# Patient Record
Sex: Male | Born: 1935
Health system: Southern US, Community
[De-identification: ages and names within clinical notes are randomized; demographics above are authoritative.]

## PROBLEM LIST (undated history)

## (undated) DIAGNOSIS — I2510 Atherosclerotic heart disease of native coronary artery without angina pectoris: Secondary | ICD-10-CM

## (undated) DIAGNOSIS — J189 Pneumonia, unspecified organism: Secondary | ICD-10-CM

## (undated) DIAGNOSIS — N200 Calculus of kidney: Secondary | ICD-10-CM

## (undated) DIAGNOSIS — F329 Major depressive disorder, single episode, unspecified: Secondary | ICD-10-CM

## (undated) DIAGNOSIS — K219 Gastro-esophageal reflux disease without esophagitis: Secondary | ICD-10-CM

## (undated) DIAGNOSIS — E119 Type 2 diabetes mellitus without complications: Secondary | ICD-10-CM

## (undated) DIAGNOSIS — I10 Essential (primary) hypertension: Secondary | ICD-10-CM

## (undated) DIAGNOSIS — I2119 ST elevation (STEMI) myocardial infarction involving other coronary artery of inferior wall: Secondary | ICD-10-CM

## (undated) DIAGNOSIS — F419 Anxiety disorder, unspecified: Secondary | ICD-10-CM

## (undated) DIAGNOSIS — E785 Hyperlipidemia, unspecified: Secondary | ICD-10-CM

## (undated) DIAGNOSIS — F32A Depression, unspecified: Secondary | ICD-10-CM

## (undated) DIAGNOSIS — M1990 Unspecified osteoarthritis, unspecified site: Secondary | ICD-10-CM

## (undated) DIAGNOSIS — J449 Chronic obstructive pulmonary disease, unspecified: Secondary | ICD-10-CM

## (undated) DIAGNOSIS — G4730 Sleep apnea, unspecified: Secondary | ICD-10-CM

## (undated) HISTORY — DX: Atherosclerotic heart disease of native coronary artery without angina pectoris: I25.10

## (undated) HISTORY — PX: EYE SURGERY: SHX253

## (undated) HISTORY — DX: Type 2 diabetes mellitus without complications: E11.9

## (undated) HISTORY — DX: Essential (primary) hypertension: I10

## (undated) HISTORY — DX: ST elevation (STEMI) myocardial infarction involving other coronary artery of inferior wall: I21.19

---

## 1992-04-13 HISTORY — PX: CORONARY ARTERY BYPASS GRAFT: SHX141

## 1998-08-03 ENCOUNTER — Inpatient Hospital Stay (HOSPITAL_COMMUNITY): Admission: AD | Admit: 1998-08-03 | Discharge: 1998-08-07 | Payer: Self-pay | Admitting: Cardiology

## 2003-08-28 ENCOUNTER — Ambulatory Visit: Admission: RE | Admit: 2003-08-28 | Discharge: 2003-08-28 | Payer: Self-pay | Admitting: Pulmonary Disease

## 2004-04-30 ENCOUNTER — Inpatient Hospital Stay (HOSPITAL_COMMUNITY): Admission: EM | Admit: 2004-04-30 | Discharge: 2004-05-02 | Payer: Self-pay | Admitting: *Deleted

## 2004-04-30 ENCOUNTER — Ambulatory Visit: Payer: Self-pay | Admitting: Cardiology

## 2004-05-12 ENCOUNTER — Ambulatory Visit: Payer: Self-pay | Admitting: Cardiology

## 2004-08-11 ENCOUNTER — Ambulatory Visit: Payer: Self-pay | Admitting: Cardiology

## 2007-06-07 ENCOUNTER — Ambulatory Visit: Payer: Self-pay | Admitting: Cardiology

## 2007-09-26 ENCOUNTER — Ambulatory Visit (HOSPITAL_COMMUNITY): Admission: RE | Admit: 2007-09-26 | Discharge: 2007-09-26 | Payer: Self-pay | Admitting: Ophthalmology

## 2007-10-10 ENCOUNTER — Ambulatory Visit (HOSPITAL_COMMUNITY): Admission: RE | Admit: 2007-10-10 | Discharge: 2007-10-10 | Payer: Self-pay | Admitting: Ophthalmology

## 2008-07-09 ENCOUNTER — Ambulatory Visit (HOSPITAL_COMMUNITY): Admission: RE | Admit: 2008-07-09 | Discharge: 2008-07-09 | Payer: Self-pay | Admitting: Ophthalmology

## 2010-08-29 NOTE — Cardiovascular Report (Signed)
NAME:  [name], [name] NO.:  [id number]   MEDICAL RECORD NO.:  [id number]          PATIENT TYPE:  INP   LOCATION:  4705                         FACILITY:  [facility]   PHYSICIAN:  Eustace Quail, M.D. [facility] DATE OF BIRTH:  09-Feb-1934   DATE OF PROCEDURE:  DATE OF DISCHARGE:                              CARDIAC CATHETERIZATION   CLINICAL HISTORY:  [name] is 77 years old, and has had remote bypass  surgery.  He has had previous stenting of the vein graft to the distal right  coronary artery, and has previously documented occlusion of the vein graft  to the diagonal branch of the LAD.  He was recently admitted with chest pain  with a diagnosis of unstable angina and scheduled for evaluation with  angiography.   PROCEDURE:  The procedure was performed by the right femoral artery using an  arterial sheath and 6-French preformed coronary catheters.  A front wall  arterial puncture was performed, and Omnipaque contrast was used.  A LIMA  catheter was used for injection of the LIMA graft. After completion of the  diagnostic study, we made the decision to proceed with intervention on the  native circumflex artery.   The patient was given Angiomax bolus in infusion, was given 300 mg of Plavix  at the beginning of the procedure, and 300 mg of Plavix at the end of the  procedure.  We used a CLS 4, 6-French guiding catheter with side holes and a  Luge wire.  We crossed the lesion in the proximal circumflex artery with the  wire without difficulty.  We pre dilated with a 2.5 x15 mm Maverick  performing one inflation up to 8 atmospheres for 30 seconds.  We then  deployed a 2.5 x60 mm Taxus stent, deploying this with one inflation of 14  atmospheres for 30 seconds.  We then post-dilated with a 2.75 x 12 mm  Quantum Maverick performing one inflation at 15 atmospheres for about 30  seconds.  Repeat diagnostic study was then performed through a guiding  catheter.  There was a 50-60%  stenosis down stream from where we positioned  the stent that we elected not to treat.  The patient tolerate the procedure  well, and left the laboratory in satisfactory condition.   RESULTS:  1.  MAIN CORONARY ARTERY:  The main coronary artery is free of significant      disease.  2.  LEFT ANTERIOR DESCENDING ARTERY:  The left anterior descending artery      was completely occluded near its origin.  3.  CIRCUMFLEX ARTERY:  The circumflex artery gave rise to a ramus artery      and an AV branch which terminated in a posterolateral branch.  The Ramus      artery had moderate, diffuse disease with 50% proximal and 70% mid      stenosis.  There was a 90% stenosis in the proximal portion of the      circumflex artery, and a moderate bed.  There was another 50-60%      stenosis in the mid portion of the circumflex  artery.  4.  RIGHT CORONARY ARTERY:  The right coronary artery is completely occluded      proximally.  5.  SAPHENOUS VEIN GRAFT:  The saphenous vein graft to the posterior      descending and posterolateral branch of the right coronary artery was      patent. There was 40% narrowing within the stent, in the proximal      portion of the graft. The graft also fed a second posterolateral branch,      and this had a 90% stenosis in its proximal portion.   The saphenous vein graft to the diagonal branch of the LAD was completely  occluded (this was old).  The LIMA graft to the LAD was patent and  functioned normally.   LEFT VENTRICULOGRAM:  The left ventriculogram was performed in the RAO  projection showed good wall motion with no areas of hypokinesis.  The  ejection fraction was 60%.   Following stenting of the lesion in the proximal circumflex artery, the  stenosis improved from 90% to 0%.   The aortic pressure was 134/74, and left ventricular pressure was 134/22.   CONCLUSION:  1.  Coronary artery disease status post coronary bypass surgery in 1994.      Severe native  vessel disease with total occlusion of the proximal left      anterior descending artery, 70% stenosis in the ramus branch of the      circumflex artery, and 90% stenosis in the proximal circumflex artery,      and total occlusion of the right coronary artery.  2.  A 40% stenosis within the stent and the saphenous vein graft to the      posterior descending, and posterolateral branch of the circumflex artery      with 90% stenosis in the second posterolateral branch distal to the      graft insertion, occluded vein graft to the diagonal branch of the LAD      (old), and patent LIMA graft to the LAD.  3.  Normal LV function.  4.  Successful stenting of the lesion in the proximal native circumflex      artery using a Taxus drug-eluting stent with improvement in the percent      of narrowing from 90% to 0%.   DISPOSITION:  The patient returned to postanesthesia care unit for further  observation.  The patient will have potential residual ischemia in the  posterolateral branch of the right coronary artery which is not feasible for  PCI.  We will plan medical therapy of that.       BB/MEDQ  D:  05/01/2004  T:  05/02/2004  Job:  [id number]   cc:   [name], Dr.  Skip [name], M.D.   Cardiopulmonary Lab

## 2010-08-29 NOTE — H&P (Signed)
NAMEARSHAWN, NORRED              ACCOUNT NO.:  [id number]   MEDICAL RECORD NO.:  [id number]          PATIENT TYPE:  INP   LOCATION:  4705                         FACILITY:  [facility]   PHYSICIAN:  Marijo Conception. Wall, M.D.   DATE OF BIRTH:  April 23, 1933   DATE OF ADMISSION:  04/30/2004  DATE OF DISCHARGE:                                HISTORY & PHYSICAL   CHIEF COMPLAINT:  Chest discomfort, much like my heart problems.   HISTORY OF PRESENT ILLNESS:  [name] is a pleasant 77 year old gentleman  who transferred from [facility] this evening for cardiac  catheterization.  He has known coronary artery disease and had coronary  artery bypass grafting in 1994 by Dr. Denton Meek. [place].  I am waiting on  the operative report at present.  He apparently had a stent about four or  five years ago.  He has done well.   Over the weekend, he had some chest discomfort for about 30 minutes but  relieved by nitroglycerin.  Again, today, he had the same event.  He came to  the emergency room to get checked out.   His EKG there showed nonspecific changes.  Cardiac enzymes were negative x  1.  Other laboratory data was unremarkable.   ALLERGIES:  No known drug allergies.   MEDICATIONS:  1.  Lantus insulin 30 units q.a.m.  2.  Lopressor 25 mg b.i.d.  3.  HCTZ unknown dose q.d.  4.  Tri-Chlor q.d.   PAST MEDICAL HISTORY:  1.  Hypertension.  2.  Diabetes.  3.  Hyperlipidemia.  4.  Degenerative joint disease in his back and in his legs.  5.  He has a difficult time sleeping and requires Trazodone and Xanax.   PAST SURGICAL HISTORY:  He has had cataract surgery.   SOCIAL HISTORY:  Lives in [place], [place] and has four children.  He is  married.  He does not smoke or drink.   FAMILY HISTORY:  His father died in a motor vehicle accident at age 29.  Mother died of a MI at age 29.   REVIEW OF SYMPTOMS:  Really unremarkable.   PHYSICAL EXAMINATION:  VITAL SIGNS:  Blood pressure  is 116/67, pulse 65 and  regular, respiratory rate 18, temperature 98.8.  GENERAL:  In no acute distress.  HEENT:  Normocephalic and atraumatic.  Extraocular movements intact.  PERRLA.  Sclerae are clear.  NECK:  Carotid upstrokes are equal bilaterally without bruits.  No JVD.  Thyroid is not enlarged.  Trachea is midline.  HEART:  Stable.  He has normal S1 and S2 without murmur, rub, or gallop.  LUNGS:  Clear.  ABDOMEN:  Protuberant with good bowel sounds.  No obvious organomegaly.  There is no midline bruit.  EXTREMITIES:  Trace edema.  Pulses were present 2+/4+ and bilaterally  symmetrical in posterior tibial and dorsalis pedis.  NEUROLOGICAL:  Intact.   ASSESSMENT:  1.  Unstable angina.  2.  Known coronary artery disease, status post coronary artery bypass graft      in 1994 and stenting about four or five years  ago.  3.  Type 2 diabetes.  4.  Obesity.  5.  Mixed hyperlipidemia.  6.  Hypertension.   PLAN:  Cardiac catheterization, left cor graft:  Indication, risks, and  potential benefits have been discussed.  We need to pull his catheter report  and also his operative report and last stent.  Percutaneous coronary  intervention prior to his procedure.  IV nitroglycerin and heparin.      TCW/MEDQ  D:  04/30/2004  T:  04/30/2004  Job:  [id number]   cc:   Marvell Fuller, M.D.  [place], [place]

## 2010-08-29 NOTE — Discharge Summary (Signed)
[name], [name]              ACCOUNT NO.:  [id number]   MEDICAL RECORD NO.:  [id number]          PATIENT TYPE:  INP   LOCATION:  6529                         FACILITY:  [facility]   PHYSICIAN:  Jenkins Rouge, M.D.     DATE OF BIRTH:  1933-07-23   DATE OF ADMISSION:  04/30/2004  DATE OF DISCHARGE:  05/02/2004                                 DISCHARGE SUMMARY   PRIMARY CARE PHYSICIAN:  Dr. Liane Comber in [place], [place].   CARDIOLOGIST:  The patient is new to the [facility] office, has been followed at  the [place] office with Dr. Lattie Haw but he will be following up in the  future with Dr. [name] at [facility] for convenience.   DISCHARGE DIAGNOSES:  Chest pain status post cardiac catheterization on  May 01, 2004 with a stent to the proximal circumflex, 90% stenosis  decreased to 0% using a Taxus drug-eluting stent.  Ejection fraction of 60%.   PAST MEDICAL HISTORY:  Coronary artery disease status post coronary artery  bypass grafting in 1994 by Dr. Redmond Pulling.  Apparently the patient had a stent  about four to five years ago also.  Other past medical history includes  hypertension, diabetes, hyperlipidemia, degenerative joint disease in his  back and legs, insomnia requiring trazodone and Xanax.   PAST SURGICAL HISTORY:  Also includes cataract surgery.   HISTORY OF PRESENT ILLNESS:  The patient was initially seen at [facility] with complaints of chest pain relieved with nitroglycerin.  Electrocardiogram initially showed nonspecific changes.  Cardiac enzymes  were negative X1.  Other laboratory data was unremarkable, however, given  the patient's history of coronary artery bypass grafting and previous  stenting it was felt we needed to repeat his cardiac catheterization to  evaluate stents.  The patient was transferred to [facility] catheterization laboratory and admitted under the care of Dr.  Jenell Milliner.   HOSPITAL COURSE:  The patient was continued on his  medications from home  which included his Lopressor, Tricor, Xanax, Lantus insulin.  Patient was  also started on intravenous heparin, nitroglycerin drip.  The patient was  taken to the catheterization laboratory on May 01, 2004 with results as  stated above.  Dr. Jenkins Rouge was in to see the patient on the day of  discharge.  Patient was stable, afebrile.   LABORATORY DATA:  White blood cell count 4.5, hemoglobin 11.6, hematocrit  34.1, platelet count 193,000.  Sodium 136, potassium 3.5, BUN 18, creatinine  1.3.  Cardiac enzymes showing CK total 63, CK-MB 1.1.  Total cholesterol  134, triglycerides 188, HDL 34, LDL 62.   DISPOSITION:  Patient is being discharged home in the care of his wife.  He  is to continuing using his Lantus insulin as previously, Tricor 145 mg  daily, Lopressor 25 mg p.o. b.i.d., Plavix 75 mg daily, aspirin 325 mg  daily, hydrochlorothiazide 25 mg daily.  Patient has been given  prescriptions for Tricor, Lopressor, Plavix and hydrochlorothiazide.  Patient is instructed he may also continue his Xanax, Trazodone, hydrocodone  for previous discomforts.   PAIN MANAGEMENT:  Nitroglycerin for chest pain.  Prescription given to  patient.   ACTIVITY:  No driving X2 days.  No lifting over 10 pounds X1 week.   DIET:  Patient is instructed to follow a low fat, low salt diet.   WOUND CARE:  Gently clean catheter site with soap and water.  No tub bathing  X1 week.   FOLLOW UP:  Patient has a follow up appointment in our [facility] office with Dr.  [name] on May 12, 2004 at 1:30 P.M.       MB/MEDQ  D:  05/02/2004  T:  05/02/2004  Job:  [id number]   cc:   Satira Sark, M.D. [facility]   Dr. Liane Comber, [place], [place].

## 2011-05-08 ENCOUNTER — Other Ambulatory Visit: Payer: Self-pay

## 2011-05-08 ENCOUNTER — Inpatient Hospital Stay (HOSPITAL_COMMUNITY)
Admission: RE | Admit: 2011-05-08 | Discharge: 2011-05-10 | DRG: 247 | Disposition: A | Discharge status: Home / Self Care | Payer: Medicare Other | Source: Other Acute Inpatient Hospital | Attending: Cardiology | Admitting: Cardiology

## 2011-05-08 ENCOUNTER — Encounter (HOSPITAL_COMMUNITY)
Admission: RE | Disposition: A | Discharge status: Home / Self Care | Payer: Self-pay | Source: Other Acute Inpatient Hospital | Attending: Cardiology

## 2011-05-08 ENCOUNTER — Inpatient Hospital Stay (HOSPITAL_COMMUNITY): Admission: AD | Admit: 2011-05-08 | Payer: Self-pay | Source: Other Acute Inpatient Hospital | Admitting: Cardiology

## 2011-05-08 DIAGNOSIS — Z79899 Other long term (current) drug therapy: Secondary | ICD-10-CM

## 2011-05-08 DIAGNOSIS — E1122 Type 2 diabetes mellitus with diabetic chronic kidney disease: Secondary | ICD-10-CM | POA: Insufficient documentation

## 2011-05-08 DIAGNOSIS — Z794 Long term (current) use of insulin: Secondary | ICD-10-CM

## 2011-05-08 DIAGNOSIS — Z9861 Coronary angioplasty status: Secondary | ICD-10-CM

## 2011-05-08 DIAGNOSIS — E119 Type 2 diabetes mellitus without complications: Secondary | ICD-10-CM | POA: Diagnosis present

## 2011-05-08 DIAGNOSIS — I2510 Atherosclerotic heart disease of native coronary artery without angina pectoris: Secondary | ICD-10-CM

## 2011-05-08 DIAGNOSIS — Z7902 Long term (current) use of antithrombotics/antiplatelets: Secondary | ICD-10-CM

## 2011-05-08 DIAGNOSIS — Z7982 Long term (current) use of aspirin: Secondary | ICD-10-CM

## 2011-05-08 DIAGNOSIS — I200 Unstable angina: Secondary | ICD-10-CM | POA: Diagnosis present

## 2011-05-08 DIAGNOSIS — E785 Hyperlipidemia, unspecified: Secondary | ICD-10-CM | POA: Insufficient documentation

## 2011-05-08 DIAGNOSIS — I25810 Atherosclerosis of coronary artery bypass graft(s) without angina pectoris: Secondary | ICD-10-CM

## 2011-05-08 DIAGNOSIS — N183 Chronic kidney disease, stage 3 unspecified: Secondary | ICD-10-CM | POA: Insufficient documentation

## 2011-05-08 DIAGNOSIS — F341 Dysthymic disorder: Secondary | ICD-10-CM | POA: Diagnosis present

## 2011-05-08 DIAGNOSIS — I25110 Atherosclerotic heart disease of native coronary artery with unstable angina pectoris: Secondary | ICD-10-CM | POA: Insufficient documentation

## 2011-05-08 DIAGNOSIS — G4733 Obstructive sleep apnea (adult) (pediatric): Secondary | ICD-10-CM | POA: Diagnosis present

## 2011-05-08 DIAGNOSIS — J309 Allergic rhinitis, unspecified: Secondary | ICD-10-CM | POA: Diagnosis present

## 2011-05-08 DIAGNOSIS — I10 Essential (primary) hypertension: Secondary | ICD-10-CM | POA: Insufficient documentation

## 2011-05-08 HISTORY — DX: Calculus of kidney: N20.0

## 2011-05-08 HISTORY — PX: PERCUTANEOUS CORONARY STENT INTERVENTION (PCI-S): SHX5485

## 2011-05-08 HISTORY — DX: Depression, unspecified: F32.A

## 2011-05-08 HISTORY — DX: Sleep apnea, unspecified: G47.30

## 2011-05-08 HISTORY — DX: Morbid (severe) obesity due to excess calories: E66.01

## 2011-05-08 HISTORY — PX: LEFT HEART CATHETERIZATION WITH CORONARY ANGIOGRAM: SHX5451

## 2011-05-08 HISTORY — DX: Anxiety disorder, unspecified: F41.9

## 2011-05-08 HISTORY — DX: Major depressive disorder, single episode, unspecified: F32.9

## 2011-05-08 HISTORY — DX: Hyperlipidemia, unspecified: E78.5

## 2011-05-08 LAB — CBC
MCV: 89 fL (ref 78.0–100.0)
Platelets: 214 10*3/uL (ref 150–400)
RBC: 4.62 MIL/uL (ref 4.22–5.81)
WBC: 5.4 10*3/uL (ref 4.0–10.5)

## 2011-05-08 LAB — CREATININE, SERUM: GFR calc Af Amer: 74 mL/min — ABNORMAL LOW (ref >90)

## 2011-05-08 SURGERY — LEFT HEART CATHETERIZATION WITH CORONARY ANGIOGRAM
Anesthesia: LOCAL | Site: Groin | Laterality: Right

## 2011-05-08 MED ORDER — MORPHINE SULFATE 2 MG/ML IJ SOLN
2.0000 mg | INTRAMUSCULAR | Status: DC | PRN
  Administered 2011-05-08 – 2011-05-09 (×2): 2 mg via INTRAVENOUS
  Filled 2011-05-08 (×2): qty 1

## 2011-05-08 MED ORDER — FAMOTIDINE IN NACL 20-0.9 MG/50ML-% IV SOLN
INTRAVENOUS | Status: AC
  Filled 2011-05-08: qty 50

## 2011-05-08 MED ORDER — ACETAMINOPHEN 325 MG PO TABS: 650.0000 mg | ORAL_TABLET | ORAL | Status: DC | PRN

## 2011-05-08 MED ORDER — SODIUM CHLORIDE 0.9 % IV SOLN
INTRAVENOUS | Status: AC
  Administered 2011-05-08: 18:00:00 via INTRAVENOUS

## 2011-05-08 MED ORDER — ASPIRIN 81 MG PO CHEW
324.0000 mg | CHEWABLE_TABLET | Freq: Once | ORAL | Status: AC
  Administered 2011-05-08: 324 mg via ORAL

## 2011-05-08 MED ORDER — ALPRAZOLAM 0.5 MG PO TABS
0.5000 mg | ORAL_TABLET | Freq: Three times a day (TID) | ORAL | Status: DC | PRN
  Administered 2011-05-08: 0.5 mg via ORAL
  Filled 2011-05-08: qty 1

## 2011-05-08 MED ORDER — DULOXETINE HCL 60 MG PO CPEP
60.0000 mg | ORAL_CAPSULE | Freq: Every day | ORAL | Status: DC
  Administered 2011-05-09 – 2011-05-10 (×2): 60 mg via ORAL
  Filled 2011-05-08 (×2): qty 1

## 2011-05-08 MED ORDER — SODIUM CHLORIDE 0.9 % IJ SOLN: 3.0000 mL | INTRAMUSCULAR | Status: DC | PRN

## 2011-05-08 MED ORDER — GLIPIZIDE 10 MG PO TABS
10.0000 mg | ORAL_TABLET | Freq: Two times a day (BID) | ORAL | Status: DC
Start: 2011-05-09 — End: 2011-05-10
  Administered 2011-05-09 – 2011-05-10 (×3): 10 mg via ORAL
  Filled 2011-05-08 (×6): qty 1

## 2011-05-08 MED ORDER — FENTANYL CITRATE 0.05 MG/ML IJ SOLN
INTRAMUSCULAR | Status: AC
  Filled 2011-05-08: qty 2

## 2011-05-08 MED ORDER — ZOLPIDEM TARTRATE 5 MG PO TABS: 5.0000 mg | ORAL_TABLET | Freq: Every evening | ORAL | Status: DC | PRN

## 2011-05-08 MED ORDER — HYDROCHLOROTHIAZIDE 12.5 MG PO CAPS
12.5000 mg | ORAL_CAPSULE | Freq: Every day | ORAL | Status: DC
  Administered 2011-05-09 – 2011-05-10 (×2): 12.5 mg via ORAL
  Filled 2011-05-08 (×2): qty 1

## 2011-05-08 MED ORDER — ALPRAZOLAM ER 0.5 MG PO TB24: 0.5000 mg | ORAL_TABLET | Freq: Three times a day (TID) | ORAL | Status: DC | PRN

## 2011-05-08 MED ORDER — MIDAZOLAM HCL 2 MG/2ML IJ SOLN
INTRAMUSCULAR | Status: AC
  Filled 2011-05-08: qty 2

## 2011-05-08 MED ORDER — SODIUM CHLORIDE 0.9 % IV SOLN: 250.0000 mL | INTRAVENOUS | Status: DC | PRN

## 2011-05-08 MED ORDER — NITROGLYCERIN 0.4 MG SL SUBL: 0.4000 mg | SUBLINGUAL_TABLET | SUBLINGUAL | Status: DC | PRN

## 2011-05-08 MED ORDER — ALPRAZOLAM 0.25 MG PO TABS
0.2500 mg | ORAL_TABLET | Freq: Two times a day (BID) | ORAL | Status: DC | PRN
  Administered 2011-05-10: 0.25 mg via ORAL
  Filled 2011-05-08: qty 1

## 2011-05-08 MED ORDER — LIDOCAINE HCL (PF) 1 % IJ SOLN
INTRAMUSCULAR | Status: AC
  Filled 2011-05-08: qty 30

## 2011-05-08 MED ORDER — HYDROCODONE-ACETAMINOPHEN 10-325 MG PO TABS
1.0000 | ORAL_TABLET | Freq: Three times a day (TID) | ORAL | Status: DC | PRN
  Administered 2011-05-08 – 2011-05-09 (×3): 1 via ORAL
  Filled 2011-05-08 (×3): qty 1

## 2011-05-08 MED ORDER — HEPARIN (PORCINE) IN NACL 2-0.9 UNIT/ML-% IJ SOLN
INTRAMUSCULAR | Status: AC
  Filled 2011-05-08: qty 2000

## 2011-05-08 MED ORDER — INSULIN GLARGINE 100 UNIT/ML ~~LOC~~ SOLN
100.0000 [IU] | SUBCUTANEOUS | Status: DC
  Administered 2011-05-09 – 2011-05-10 (×2): 100 [IU] via SUBCUTANEOUS
  Filled 2011-05-08: qty 3

## 2011-05-08 MED ORDER — ASPIRIN 81 MG PO CHEW
81.0000 mg | CHEWABLE_TABLET | Freq: Every day | ORAL | Status: DC
  Administered 2011-05-09 – 2011-05-10 (×2): 81 mg via ORAL
  Filled 2011-05-08 (×2): qty 1

## 2011-05-08 MED ORDER — BIVALIRUDIN 250 MG IV SOLR
INTRAVENOUS | Status: AC
  Filled 2011-05-08: qty 250

## 2011-05-08 MED ORDER — ENOXAPARIN SODIUM 40 MG/0.4ML ~~LOC~~ SOLN
40.0000 mg | SUBCUTANEOUS | Status: DC
  Filled 2011-05-08: qty 0.4

## 2011-05-08 MED ORDER — LISINOPRIL 20 MG PO TABS
20.0000 mg | ORAL_TABLET | Freq: Every day | ORAL | Status: DC
  Administered 2011-05-09 – 2011-05-10 (×2): 20 mg via ORAL
  Filled 2011-05-08 (×2): qty 1

## 2011-05-08 MED ORDER — ONDANSETRON HCL 4 MG/2ML IJ SOLN: 4.0000 mg | Freq: Four times a day (QID) | INTRAMUSCULAR | Status: DC | PRN

## 2011-05-08 MED ORDER — ASPIRIN 81 MG PO CHEW: 324.0000 mg | CHEWABLE_TABLET | ORAL | Status: DC

## 2011-05-08 MED ORDER — ASPIRIN 81 MG PO CHEW
CHEWABLE_TABLET | ORAL | Status: AC
  Filled 2011-05-08: qty 4

## 2011-05-08 MED ORDER — DIAZEPAM 5 MG PO TABS: 5.0000 mg | ORAL_TABLET | ORAL | Status: DC

## 2011-05-08 MED ORDER — SODIUM CHLORIDE 0.9 % IJ SOLN: 3.0000 mL | Freq: Two times a day (BID) | INTRAMUSCULAR | Status: DC

## 2011-05-08 MED ORDER — SODIUM CHLORIDE 0.9 % IJ SOLN
3.0000 mL | Freq: Two times a day (BID) | INTRAMUSCULAR | Status: DC
  Administered 2011-05-08 – 2011-05-10 (×4): 3 mL via INTRAVENOUS

## 2011-05-08 MED ORDER — CLOPIDOGREL BISULFATE 75 MG PO TABS
75.0000 mg | ORAL_TABLET | Freq: Every day | ORAL | Status: DC
  Administered 2011-05-09 – 2011-05-10 (×2): 75 mg via ORAL
  Filled 2011-05-08 (×3): qty 1

## 2011-05-08 MED ORDER — POLYETHYLENE GLYCOL 3350 17 G PO PACK
17.0000 g | PACK | Freq: Every day | ORAL | Status: DC
  Administered 2011-05-09 – 2011-05-10 (×2): 17 g via ORAL
  Filled 2011-05-08 (×2): qty 1

## 2011-05-08 MED ORDER — DIAZEPAM 5 MG PO TABS
5.0000 mg | ORAL_TABLET | Freq: Once | ORAL | Status: AC
  Administered 2011-05-08: 5 mg via ORAL

## 2011-05-08 MED ORDER — NITROGLYCERIN 0.2 MG/ML ON CALL CATH LAB
INTRAVENOUS | Status: AC
  Filled 2011-05-08: qty 1

## 2011-05-08 MED ORDER — ASPIRIN 81 MG PO CHEW: 81.0000 mg | CHEWABLE_TABLET | Freq: Every day | ORAL | Status: DC

## 2011-05-08 MED ORDER — DILTIAZEM HCL ER COATED BEADS 240 MG PO CP24
240.0000 mg | ORAL_CAPSULE | Freq: Every day | ORAL | Status: DC
  Administered 2011-05-09 – 2011-05-10 (×2): 240 mg via ORAL
  Filled 2011-05-08 (×2): qty 1

## 2011-05-08 MED ORDER — SODIUM CHLORIDE 0.9 % IV SOLN: INTRAVENOUS | Status: DC

## 2011-05-08 MED ORDER — CLOPIDOGREL BISULFATE 300 MG PO TABS
ORAL_TABLET | ORAL | Status: AC
  Filled 2011-05-08: qty 2

## 2011-05-08 MED ORDER — DIAZEPAM 5 MG PO TABS
ORAL_TABLET | ORAL | Status: AC
  Filled 2011-05-08: qty 1

## 2011-05-08 NOTE — H&P (Signed)
The patient was seen at [facility] by Dr. [name].  Complete notes are in the paper chart.  He has had previous CABG.  he has had stent placement to his distal RCA graft he has known occlusion to a vein graft to the diagonal and in 2006 most recently had a drug-eluting stent placed in the native circumflex. He presented the [facility] with increasing jaw discomfort consistent with unstable angina. Dr. [name] has suggested cardiac catheterization. The patient is agreeable.  After reviewing the risks and benefits he is in agreement to proceed and transferred today diagnostic cardiac catheterization. The risks and benefits of the procedure have been explained to include stroke heart attack and death, bleeding infection arrhythmias vascular trauma and other.

## 2011-05-08 NOTE — Op Note (Signed)
CARDIAC CATH NOTE  Name: [name] MRN: [id number] DOB: May 31, 1933  Procedure: PTCA and stenting of the SVG to the RCA  Indication: The patient underwent cath by Dr. Percival Spanish.  He has had symptoms with fairly minimal exertion.  His angiogram suggested some SVG progression in the mid portion of the RCA graft at the end of the previously placed stents.  Distally there was also progression, well beyond the insertion site, and where the vessel was not favorable for PCI.  As such, we discussed various options and the feeling was that we should open the right SVG and see how he does.  The patient and family were both apprised that we felt that the distal vessel may be the potential cause of his symptoms, but could not be sure.  With a patent LIMA, and patent CFX native stent, we did not feel that a redo CABG was the best option.  The patient was agreeable to proceed.    Procedural Details: The right groin had a prior  a 5 and this was exchanged for a 4F sheath.  Weight-based bivalirudin was given for anticoagulation. Once a therapeutic ACT was achieved, a 6 [place] RCA [facility] guide catheter was inserted.  A  prowater coronary guidewire was used to cross the lesion.  A 4.0 SPIDER filter was then placed in to the distal artery.  The lesion was then stented with a 3.0 by 12 Medtronic Resolute stent.  The stent was postdilated with a 2.75 mm  noncompliant balloon to high pressures.  We avoided using a large post dil balloon to avoid perforation.  Following PCI, there was 10% residual stenosis and TIMI-3 flow. The SPIDER catheter was removed.  Final angiography confirmed an excellent result. The patient tolerated the procedure well. There were no immediate procedural complications.  The sheath was secured into place for later removal.   The patient was transferred to the post catheterization recovery area for further monitoring.  Angios:  The SVG to the RCA has been previously stented.  There is 30-40% narrowing  proximally, the a long stented area.  In the mid graft, just past the stent, there is a 60-75% eccentric, slightly hypodense lesion.  Distally the graft inserts; retrograde, the vessel fills and then supplies another severely disease, PLA vessel.  The 60-75% lesion was treated to 10% with good flow.  The PLA disease remains.    Lesion Data: Vessel: SVG to the PDA/PLA Percent stenosis (pre): 75% TIMI-flow (pre):  3 Stent:  3.0 by 12 Medtronic Resolute Percent stenosis (post): 10% TIMI-flow (post): 3  Conclusions:  1.  Progressive SVG lesion in a previously stent SVG to the RCA at the distal stent end. 2.  Successful PCI using a DES 3.  Significant distal disease of the RCA that is not favorable for PCI 4.  Patent LIMA to the LAD 5.  Patent DES stent to the CFX artery  Recommendations:  1.  DAPT 2.  Monitor progress and advance meds as tolerated.    Bing Quarry 05/08/2011, 5:05 PM

## 2011-05-08 NOTE — Procedures (Signed)
  Cardiac Catheterization Procedure Note  Name: [name] MRN: [id number] DOB: 12-11-1933  Procedure: Left Heart Cath, Selective Coronary Angiography, LV angiography  Indication: Unstable angina.  Previous CABG and PCI with of his native circ and SVG to the RCA  Procedural details: The right groin was prepped, draped, and anesthetized with 1% lidocaine. Using modified Seldinger technique, a 5 French sheath was introduced into the right femoral artery. Standard Judkins catheters were used for coronary angiography and left ventriculography. Catheter exchanges were performed over a guidewire. There were no immediate procedural complications. The patient was transferred to the post catheterization recovery area for further monitoring.  Procedural Findings:  Hemodynamics:     AO 143/85    LV 141/21   Coronary angiography:  Coronary dominance: Right  Left mainstem:   Long mid and distal calcified 25% stenosis.  Left anterior descending (LAD):   Occluded at the ostium. Heavy calcification. The distal vessel fills via the LIMA and appears to be free of high-grade disease. There apparently is a diagonal that is occluded and no longer visualized as it fills via the vein graft.  Left circumflex (LCx):  There is a ramus intermediate that is moderate-sized. It is branching. There is moderate calcification. There is diffuse 60-70% stenosis in a somewhat narrow caliber vessel. The proximal AV groove has a stent with mild luminal irregularities but is otherwise widely patent. There is a step off in the caliber of the vessel immediately after the stent with long 50% stenosis leading into a large obtuse marginal. The obtuse marginal is free of high-grade disease.  Right coronary artery (RCA):  Occluded at the proximal segment. The distal vessel fills via the sequential vein graft. The graft does appear to be sewn into a PDA and posterior lateral. There does appear to be some high-grade disease before a  more distal posterior lateral. This appears to be unprotected by the graft but unchanged from his previous catheterization. There is high-grade disease proximally in the PDA and posterior lateral that are bypassed.  LIMA to LAD:   Widely patent  SVG to Diag:  Occluded (as demonstrated in the previous catheterization)  SVG to RCA/PL:  The proximal stent is widely patent. There is approximately a 70% shelflike lesion immediately after the stent. There are some mild luminal irregularities in the mid portion of the graft.  Left ventriculography: Left ventricular systolic function is normal, LVEF is estimated at 55-65%, there is no significant mitral regurgitation   Final Conclusions:  Severe native coronary disease with graft disease as described. Preserved ejection fraction.  Recommendations: I have reviewed the films carefully with Dr. Lia Foyer. Given the unstable nature of his symptoms percutaneous revascularization of his bypass grafts as indicated.  Minus Breeding 05/08/2011, 3:17 PM

## 2011-05-09 ENCOUNTER — Encounter (HOSPITAL_COMMUNITY): Payer: Self-pay | Admitting: *Deleted

## 2011-05-09 ENCOUNTER — Other Ambulatory Visit: Payer: Self-pay

## 2011-05-09 DIAGNOSIS — I25110 Atherosclerotic heart disease of native coronary artery with unstable angina pectoris: Secondary | ICD-10-CM | POA: Insufficient documentation

## 2011-05-09 DIAGNOSIS — I2510 Atherosclerotic heart disease of native coronary artery without angina pectoris: Secondary | ICD-10-CM

## 2011-05-09 LAB — BASIC METABOLIC PANEL
Chloride: 102 mEq/L (ref 96–112)
GFR calc Af Amer: 69 mL/min — ABNORMAL LOW (ref >90)
Potassium: 4.4 mEq/L (ref 3.5–5.1)

## 2011-05-09 LAB — GLUCOSE, CAPILLARY: Glucose-Capillary: 169 mg/dL — ABNORMAL HIGH (ref 70–99)

## 2011-05-09 LAB — CBC
HCT: 36.6 % — ABNORMAL LOW (ref 39.0–52.0)
Platelets: 181 10*3/uL (ref 150–400)
RDW: 13.9 % (ref 11.5–15.5)
WBC: 4.3 10*3/uL (ref 4.0–10.5)

## 2011-05-09 LAB — MRSA PCR SCREENING: MRSA by PCR: NEGATIVE

## 2011-05-09 NOTE — Progress Notes (Signed)
CARDIAC REHAB PHASE I   PRE:  Rate/Rhythm: Sinus Rhythm 70  BP:    Sitting: 158/80 manual     SaO2: 96 2l  NCC MODE:  Ambulation: 240 ft   POST:  Rate/Rhythem: Sinus rhythm 84  BP:    Sitting: 168/80 manual    SaO2: 95 Room air 1000- 1015, 1100-1200 Order appreciated. Patient ambulated 240 feet with walker on room air with assistance times two on portable monitor. Patient tolerated walk without complaints of chest pain or shortness of breath.  O2 saturation 97% on room air half way through walk.  Mr Yearta mentioned falling at home as recently as Christmas. Mr Kerin uses a walker in the house and a cane outside. Please consider a physical therapy consult and a rolling walker for home use.  Mr Elsbury's current walker does not have wheels.  Patient's nurse notified of blood pressures.  Discharge education complete.  Mr Alway is interested in outpatient cardiac rehab at [place], [place]

## 2011-05-09 NOTE — Progress Notes (Signed)
Patient ID: [name], male   DOB: Apr 13, 1934, 78 y.o.   MRN: [id number] SUBJECTIVE:The patient is feeling very well after his coronary intervention yesterday. He has had some very mild rest pain over the past several weeks. Yesterday he had pain that radiated into his arm and he went to the hospital at [facility] and was transferred here for cath. He had an intervention to one vessel. There is a second area that we'll have to be watched medically. The procedure was completed late in the day yesterday. He has not ambulated at all yet.  Filed Vitals:   05/08/11 2008 05/09/11 0049 05/09/11 0531 05/09/11 0835  BP:  149/49 144/37 163/52  Pulse:  66 58 64  Temp:  97.7 F (36.5 C) 97.4 F (36.3 C) 97.4 F (36.3 C)  TempSrc:  Oral Oral Oral  Resp:  19 13 14   Height: 5\' 5"  (1.651 m)     Weight: 267 lb 15.9 oz (121.56 kg)  268 lb 4.8 oz (121.7 kg)   SpO2:  96% 97% 95%    Intake/Output Summary (Last 24 hours) at 05/09/11 0843 Last data filed at 05/09/11 0700  Gross per 24 hour  Intake    123 ml  Output   1525 ml  Net  -1402 ml    LABS: Basic Metabolic Panel:  Basename 05/09/11 0700 05/08/11 1920  NA 140 --  K 4.4 --  CL 102 --  CO2 29 --  GLUCOSE 174* --  BUN 22 --  CREATININE 1.16 1.09  CALCIUM 8.8 --  MG -- --  PHOS -- --   Liver Function Tests: No results found for this basename: AST:2,ALT:2,ALKPHOS:2,BILITOT:2,PROT:2,ALBUMIN:2 in the last 72 hours No results found for this basename: LIPASE:2,AMYLASE:2 in the last 72 hours CBC:  Basename 05/09/11 0700 05/08/11 1920  WBC 4.3 5.4  NEUTROABS -- --  HGB 11.6* 13.2  HCT 36.6* 41.1  MCV 89.3 89.0  PLT 181 214   Cardiac Enzymes: No results found for this basename: CKTOTAL:3,CKMB:3,CKMBINDEX:3,TROPONINI:3 in the last 72 hours BNP: No components found with this basename: POCBNP:3 D-Dimer: No results found for this basename: DDIMER:2 in the last 72 hours Hemoglobin A1C: No results found for this basename: HGBA1C in the  last 72 hours Fasting Lipid Panel: No results found for this basename: CHOL,HDL,LDLCALC,TRIG,CHOLHDL,LDLDIRECT in the last 72 hours Thyroid Function Tests: No results found for this basename: TSH,T4TOTAL,FREET3,T3FREE,THYROIDAB in the last 72 hours  RADIOLOGY: No results found.  PHYSICAL EXAM Patient is oriented to person time and place. Affect is normal. There is no jugulovenous distention. Lungs are clear. Respiratory effort is nonlabored. Cardiac exam reveals S1 and S2. There are no clicks or significant murmurs. The abdomen is soft. The cath site in the right groin is stable. There is no significant hematoma. There is no significant bruit. He has no significant peripheral edema.   TELEMETRY: Telemetry is reviewed by me. He has normal sinus rhythm.  ASSESSMENT AND PLAN:  Active Problems   Coronary artery disease   The patient had PCI done late yesterday. He is stable. He has not ambulated yet. He had rest pain before admission. He does have residual disease. It will be important for him to ambulate during the day today. If he is completely stable he will go home the following day.  Renal function Patient has had slight rise in his creatinine today after his procedure. Chemistry will be checked again tomorrow morning.   Dola Argyle 05/09/2011 8:43 AM

## 2011-05-09 NOTE — Progress Notes (Signed)
Site area: right groin  Site Prior to Removal:  Level 0  Pressure Applied For 20 minutes   Minutes Beginning at 1933  Manual:   no  Patient Status During Pull:  stable  Post Pull Groin Site:  Level 0  Post Pull Instructions Given:  yes  Post Pull Pulses Present:  yes  Dressing Applied:  yes  Comments:  Patient tolerated procedure well

## 2011-05-10 ENCOUNTER — Encounter (HOSPITAL_COMMUNITY): Payer: Self-pay | Admitting: Physician Assistant

## 2011-05-10 DIAGNOSIS — G4730 Sleep apnea, unspecified: Secondary | ICD-10-CM | POA: Insufficient documentation

## 2011-05-10 DIAGNOSIS — I10 Essential (primary) hypertension: Secondary | ICD-10-CM | POA: Insufficient documentation

## 2011-05-10 DIAGNOSIS — E785 Hyperlipidemia, unspecified: Secondary | ICD-10-CM | POA: Insufficient documentation

## 2011-05-10 DIAGNOSIS — E1122 Type 2 diabetes mellitus with diabetic chronic kidney disease: Secondary | ICD-10-CM | POA: Insufficient documentation

## 2011-05-10 LAB — BASIC METABOLIC PANEL
BUN: 20 mg/dL (ref 6–23)
CO2: 30 mEq/L (ref 19–32)
Calcium: 9.7 mg/dL (ref 8.4–10.5)
Chloride: 101 mEq/L (ref 96–112)
Creatinine, Ser: 1.16 mg/dL (ref 0.50–1.35)
Glucose, Bld: 126 mg/dL — ABNORMAL HIGH (ref 70–99)

## 2011-05-10 MED ORDER — LISINOPRIL-HYDROCHLOROTHIAZIDE 20-12.5 MG PO TABS: 1.0000 | ORAL_TABLET | Freq: Every day | ORAL | Status: DC

## 2011-05-10 MED ORDER — CLOPIDOGREL BISULFATE 75 MG PO TABS: 75.0000 mg | ORAL_TABLET | Freq: Every day | ORAL | Status: DC

## 2011-05-10 MED ORDER — ASPIRIN 81 MG PO TBEC: 81.0000 mg | DELAYED_RELEASE_TABLET | Freq: Every day | ORAL | Status: DC

## 2011-05-10 NOTE — Progress Notes (Addendum)
Patient ID: [name], male   DOB: November 09, 1933, 78 y.o.   MRN: [id number]    SUBJECTIVE: No chest pain, no complaints.  Walked with cardiac rehab yesterday.      Marland Kitchen aspirin  81 mg Oral Daily  . clopidogrel  75 mg Oral Q breakfast  . diltiazem  240 mg Oral Daily  . DULoxetine  60 mg Oral Daily  . glipiZIDE  10 mg Oral BID AC  . hydrochlorothiazide  12.5 mg Oral Daily  . insulin glargine  100 Units Subcutaneous Q0700  . lisinopril  20 mg Oral Daily  . polyethylene glycol  17 g Oral Daily  . sodium chloride  3 mL Intravenous Q12H      Filed Vitals:   05/10/11 0500 05/10/11 0600 05/10/11 0700 05/10/11 0800  BP: 135/48 125/36 133/46 125/50  Pulse: 54   57  Temp:    97.9 F (36.6 C)  TempSrc:    Oral  Resp: 15 9 15    Height:      Weight: 122.7 kg (270 lb 8.1 oz)     SpO2: 95%   97%    Intake/Output Summary (Last 24 hours) at 05/10/11 1024 Last data filed at 05/10/11 0900  Gross per 24 hour  Intake    240 ml  Output   2900 ml  Net  -2660 ml    LABS: Basic Metabolic Panel:  Basename 05/10/11 0500 05/09/11 0700  NA 137 140  K 4.0 4.4  CL 101 102  CO2 30 29  GLUCOSE 126* 174*  BUN 20 22  CREATININE 1.16 1.16  CALCIUM 9.7 8.8  MG -- --  PHOS -- --   Liver Function Tests: No results found for this basename: AST:2,ALT:2,ALKPHOS:2,BILITOT:2,PROT:2,ALBUMIN:2 in the last 72 hours No results found for this basename: LIPASE:2,AMYLASE:2 in the last 72 hours CBC:  Basename 05/09/11 0700 05/08/11 1920  WBC 4.3 5.4  NEUTROABS -- --  HGB 11.6* 13.2  HCT 36.6* 41.1  MCV 89.3 89.0  PLT 181 214    RADIOLOGY: No results found.  PHYSICAL EXAM General: NAD, obese Neck: No JVD, no thyromegaly or thyroid nodule.  Lungs: Clear to auscultation bilaterally with normal respiratory effort. CV: Nondisplaced PMI.  Heart regular S1/S2, no S3/S4, no murmur.  1+ ankle edema.   Abdomen: Soft, nontender, no hepatosplenomegaly, no distention.  Neurologic: Alert and oriented x  3.  Psych: Normal affect. Extremities: No clubbing or cyanosis.   TELEMETRY: Reviewed telemetry pt in NS[facility]  ASSESSMENT AND PLAN: 78 yo with history of CAD s/p CABG and DM presented with unstable angina, now s/p DES to SVG-RCA.  He is stable with no chest pain with ambulation.   - OK for discharge today.  - Continue current cardiac meds, would also have him go back on home Tricor.  He had side effects with Lipitor and thinks he had problems with other statins also. He cannot take Niaspan.  Therefore, will not send home on statin.  Will need close followup with Dr. [name] in [place] and can decide on statin at that point.  Needs to see [name] in about a week.  - Cardiac rehab at [facility].  - Needs rolling walker.   Loralie Champagne 05/10/2011 10:27 AM

## 2011-05-10 NOTE — Progress Notes (Signed)
   CARE MANAGEMENT NOTE 05/10/2011  Patient:  [name], [name]   Account Number:  [id number]  Date Initiated:  05/10/2011  Documentation initiated by:  [facility]  Subjective/Objective Assessment:   CAD, unstable angina     Action/Plan:   Anticipated DC Date:  05/10/2011   Anticipated DC Plan:  [place]  CM consult      Choice offered to / List presented to:     DME arranged  Vassie Moselle      DME agency  [place].        Status of service:  Completed, signed off Medicare Important Message given?   (If response is "NO", the following Medicare IM given date fields will be blank) Date Medicare IM given:   Date Additional Medicare IM given:    Discharge Disposition:  HOME/SELF CARE  Per UR Regulation:    Comments:  05/10/2011 1200 Contacted [facility] for RW for scheduled d/c today. Unit RN will follow up with MD about [facility] PT. Pt scheduled for Cardiac Rehab. No orders for [facility] PT. Jonnie Finner RN CCM Case Mgmt phone [phone]

## 2011-05-10 NOTE — Discharge Summary (Signed)
Discharge Summary   Patient ID: [name] MRN: [id number], DOB/AGE: 1933-12-02 78 y.o. Admit date: 05/08/2011 D/C date:     05/10/2011   Primary Discharge Diagnoses:  1. CAD with unstable angina this admission   - s/p DES to SVG->RCA 05/08/11 (normal EF by cath) - s/p CABG x 4 in 1994 - s/p previous stent placement to Vg-> distal RCA - s/p DES to native Cx in 2006 - not on statin secondary to history of intolerance with statins  Secondary Discharge Diagnoses:  1. HTN 2. Depression with anxious features and hx of panic attacks 3. DM2 since 1998 4. Morbid obesity 5. HL 6. Nephrolithasis 7. Allergic rhinitis  8. OSA  Hospital Course: 78 y/o M with hx of CAD s/p CABG presented to [facility] initially with increasing jaw discomfort and left arm pain consistent with unstable angina. Dr. [name] suggested cardiac catheterization and the patient was agreeable. He underwent cath 05/08/11 showing progressive SVG lesion in a previously stent SVG to the RCA at the distal stent end. He had significant distal disease of the RCA that is not favorable for PCI. With a patent LIMA, and patent CFX native stent, Dr. Lia Foyer did not feel that a redo CABG was the best option. Ultimately the patient received a DES to the SVG->RCA. He did well post-procedurally. Cr bumped slightly to 1.16 but remained stable. He ambulated with cardiac rehab. Dr. Aundra Dubin has seen and examined him today and feels he is stable for discharge. He elected to keep the patient's diltiazem as is -- may consider changing to BB as outpatient. Of note, the patient has had side effects with Liptor and thinks he had problems with other statins also - Dr. Aundra Dubin feels he will need close followup with Dr. [name] in [place] and can decide on statin at that point.   Discharge Vitals: Blood pressure 125/50, pulse 57, temperature 97.9 F (36.6 C), temperature source Oral, resp. rate 15, height 5\' 5"  (1.651 m), weight 270 lb 8.1 oz (122.7  kg), SpO2 97.00%.  Labs: Lab Results  Component Value Date   WBC 4.3 05/09/2011   HGB 11.6* 05/09/2011   HCT 36.6* 05/09/2011   MCV 89.3 05/09/2011   PLT 181 05/09/2011     Lab 05/10/11 0500  NA 137  K 4.0  CL 101  CO2 30  BUN 20  CREATININE 1.16  CALCIUM 9.7  PROT --  BILITOT --  ALKPHOS --  ALT --  AST --  GLUCOSE 126*    Diagnostic Studies/Procedures   1. Cardiac catheterization this admission, please see full report and above for summary.   Discharge Medications   Medication List  As of 05/10/2011 11:31 AM   STOP taking these medications         naproxen 500 MG tablet         TAKE these medications         alprazolam 2 MG tablet   Commonly known as: XANAX   Take 2 mg by mouth at bedtime as needed.      aspirin 81 MG EC tablet   Take 1 tablet (81 mg total) by mouth daily.      clopidogrel 75 MG tablet   Commonly known as: PLAVIX   Take 1 tablet (75 mg total) by mouth daily with breakfast.      diltiazem 240 MG 24 hr capsule   Commonly known as: DILACOR XR   Take 240 mg by mouth daily.  DULoxetine 60 MG capsule   Commonly known as: CYMBALTA   Take 60 mg by mouth daily.      fenofibrate 145 MG tablet   Commonly known as: TRICOR   Take 145 mg by mouth daily.      glipiZIDE 10 MG tablet   Commonly known as: GLUCOTROL   Take 10 mg by mouth 2 (two) times daily before a meal.      HYDROcodone-acetaminophen 10-500 MG per tablet   Commonly known as: LORTAB   Take 1 tablet by mouth 3 (three) times daily as needed. For pain      insulin glargine 100 UNIT/ML injection   Commonly known as: LANTUS   Inject 100 Units into the skin every morning.      lisinopril-hydrochlorothiazide 20-12.5 MG per tablet   Commonly known as: PRINZIDE,ZESTORETIC   Take 1 tablet by mouth daily.      nitroGLYCERIN 0.4 MG/SPRAY spray   Commonly known as: NITROLINGUAL   Place 1 spray under the tongue every 5 (five) minutes as needed. For chest pain      polyethylene  glycol packet   Commonly known as: MIRALAX / GLYCOLAX   Take 17 g by mouth daily.           The patient was on lisinopril/hctz at home bid, but pressures were controlled on once daily so that is what he will go home on.  Disposition   The patient will be discharged in stable condition to home. Discharge Orders    Future Orders Please Complete By Expires   Diet - low sodium heart healthy      Increase activity slowly      Comments:   Keep procedure site clean & dry. If you notice increased pain, swelling, bleeding or pus, call/return!  You may shower, but no soaking baths/hot tubs/pools for 1 week. No driving for 2 days. No lifting over 5 lbs for 1 week. No sexual activity for 1 week.   Discharge instructions      Comments:   Naproxen can increase risk of stomach bleeding while taking medicines like aspirin and Plavix. Talk to your primary doctor about alternatives.     Follow-up Information    Follow up with [name], MD. (Our office will call you to schedule appointment)    Contact information:   [facility] [phone]           Duration of Discharge Encounter: Greater than 30 minutes including physician and PA time.  Signed, Melina Copa PA-C 05/10/2011, 11:31 AM

## 2011-05-11 ENCOUNTER — Telehealth: Payer: Self-pay | Admitting: *Deleted

## 2011-05-11 MED FILL — Bivalirudin Trifluoroacetate For IV Soln 250 MG (Base Equiv): INTRAVENOUS | Qty: 250 | Status: AC

## 2011-05-11 MED FILL — Dextrose Inj 5%: INTRAVENOUS | Qty: 50 | Status: AC

## 2011-05-11 NOTE — Telephone Encounter (Signed)
Hinton Dyer (PA) left a message stating referral was being made to Cardiac Rehab at [facility] upon pt's d/c. She left a message w/our office to handle anything further that is needed.   Left message on voicemail w/MaryAnn at Cardiac Rehab at [facility] to determine if we need to do anything further.

## 2011-05-11 NOTE — Telephone Encounter (Signed)
[name] states she has not received referral at this time. She states these are sometimes sent in the mail. She will notify us if she (does or does not) receives this.

## 2011-05-13 ENCOUNTER — Encounter: Payer: Self-pay | Admitting: Cardiology

## 2011-05-13 ENCOUNTER — Ambulatory Visit (INDEPENDENT_AMBULATORY_CARE_PROVIDER_SITE_OTHER): Payer: Medicare Other | Admitting: Cardiology

## 2011-05-13 VITALS — BP 169/72 | HR 94 | Resp 16 | Ht 65.0 in | Wt 259.0 lb

## 2011-05-13 DIAGNOSIS — E785 Hyperlipidemia, unspecified: Secondary | ICD-10-CM

## 2011-05-13 DIAGNOSIS — I10 Essential (primary) hypertension: Secondary | ICD-10-CM

## 2011-05-13 DIAGNOSIS — I2510 Atherosclerotic heart disease of native coronary artery without angina pectoris: Secondary | ICD-10-CM

## 2011-05-13 MED ORDER — ISOSORBIDE MONONITRATE ER 30 MG PO TB24: 30.0000 mg | ORAL_TABLET | Freq: Every day | ORAL | Status: DC

## 2011-05-13 NOTE — Assessment & Plan Note (Signed)
Followed by [facility].

## 2011-05-13 NOTE — Assessment & Plan Note (Signed)
Request most recent lipid panel from [facility]. We did discuss alternative statin such as Crestor and Pravachol. LDL should be under 100. He has had problems with significant hypertriglyceridemia in the past.

## 2011-05-13 NOTE — Assessment & Plan Note (Signed)
Plan to continue medical therapy following recent intervention. Imdur 30 mg daily is being added for additional angina control in light of some residual disease without optimal revascularization strategies. For now will continue his calcium channel blocker, and reassess possible medication adjustments over time. Next followup will be in the [place] clinic.

## 2011-05-13 NOTE — Patient Instructions (Signed)
Your physician has recommended you make the following change in your medication: start taking Imdur 30 mg at bedtime  Your physician recommends that you schedule a follow-up appointment in: 1 month in [place] office

## 2011-05-13 NOTE — Assessment & Plan Note (Signed)
Will continue to follow with further medication adjustments over time. Clearly weight loss would also be of benefit.

## 2011-05-13 NOTE — Progress Notes (Signed)
Clinical Summary [name] is a 78 y.o.male presenting for followup. He was seen in consultation at [facility] recently with unstable angina and transferred to [facility] for catheterization. He underwent DES to the SVG to RCA, had patent LIMA to LAD and native circumflex stent otherwise managed medically.  He is here with his wife today. Reports some brief episodes of chest pain that have been responsive to nitroglycerin spray, otherwise stable. Today we reviewed his hospitalization, discussed possibility of statin therapy other than Zocor or Lipitor which he has had difficulty with in the past. He reports having lipids done through [facility] within the last few months which we will request.  We also reviewed his medications and discussed possible modifications over time as he is established more regularly in the clinic. He prefers to be seen in [place].   Allergies  Allergen Reactions  . Zocor (Simvastatin)     Current Outpatient Prescriptions  Medication Sig Dispense Refill  . alprazolam (XANAX) 2 MG tablet Take 2 mg by mouth at bedtime as needed.      Marland Kitchen aspirin EC 325 MG tablet Take 325 mg by mouth daily.      . clopidogrel (PLAVIX) 75 MG tablet Take 1 tablet (75 mg total) by mouth daily with breakfast.  30 tablet  6  . diltiazem (DILACOR XR) 240 MG 24 hr capsule Take 240 mg by mouth daily.      . DULoxetine (CYMBALTA) 60 MG capsule Take 60 mg by mouth daily.      . fenofibrate (TRICOR) 145 MG tablet Take 145 mg by mouth daily.      Marland Kitchen glipiZIDE (GLUCOTROL) 10 MG tablet Take 10 mg by mouth 2 (two) times daily before a meal.      . HYDROcodone-acetaminophen (LORTAB) 10-500 MG per tablet Take 1 tablet by mouth 3 (three) times daily as needed. For pain      . insulin glargine (LANTUS) 100 UNIT/ML injection Inject 100 Units into the skin every morning.      Marland Kitchen lisinopril-hydrochlorothiazide (PRINZIDE,ZESTORETIC) 20-12.5 MG per tablet Take 1 tablet by mouth daily.      . nitroGLYCERIN  (NITROLINGUAL) 0.4 MG/SPRAY spray Place 1 spray under the tongue every 5 (five) minutes as needed. For chest pain      . polyethylene glycol (MIRALAX / GLYCOLAX) packet Take 17 g by mouth daily.      . isosorbide mononitrate (IMDUR) 30 MG 24 hr tablet Take 1 tablet (30 mg total) by mouth daily.  30 tablet  3    Past Medical History  Diagnosis Date  . Coronary atherosclerosis of native coronary artery     DES SVG to RCA 05/08/11. CABG x 4 in 1994. Previous stent placement to SVG to distal RCA . DES to native Cx in 2006..  . Sleep apnea   . Essential hypertension, benign   . Depression     With anxious features/hx panic attacks  . Morbid obesity   . Hyperlipidemia     Not on statin 2/2 hx of intolerance.   . Nephrolithiasis   . Allergic rhinitis   . Type 2 diabetes mellitus     Past Surgical History  Procedure Date  . Coronary artery bypass graft 1994  . Eye surgery     Family History  Problem Relation Age of Onset  . Heart attack Mother     Age 66    Social History [name] reports that he has been passively smoking Cigars.  He has never  used smokeless tobacco. [name] reports that he does not drink alcohol.  Review of Systems No palpitations or syncope. Has been doing some basic exercises at home. No falls. Stable appetite. No reported bleeding problems. Otherwise negative.  Physical Examination Filed Vitals:   05/13/11 1428  BP: 169/72  Pulse: 94  Resp: 16   Morbidly obese male in no acute distress. HEENT: Conjunctiva and lids normal, oropharynx clear. Neck: Supple, no elevated JVP or carotid bruits, no thyromegaly. Lungs: Clear to auscultation, nonlabored breathing at rest. Cardiac: Regular rate and rhythm, no S3, no pericardial rub. Abdomen: Soft, nontender, bowel sounds present, no guarding or rebound. Extremities: No pitting edema, distal pulses 1-2+. Skin: Warm and dry. Musculoskeletal: No kyphosis. Neuropsychiatric: Alert and oriented x3, affect  grossly appropriate.    Problem List and Plan

## 2011-05-20 NOTE — Telephone Encounter (Signed)
This referral was received by cardiac rehab.

## 2011-06-11 ENCOUNTER — Ambulatory Visit (INDEPENDENT_AMBULATORY_CARE_PROVIDER_SITE_OTHER): Payer: Medicare Other | Admitting: Cardiology

## 2011-06-11 ENCOUNTER — Encounter: Payer: Self-pay | Admitting: Cardiology

## 2011-06-11 VITALS — BP 189/82 | HR 88 | Resp 18 | Ht 66.0 in | Wt 271.0 lb

## 2011-06-11 DIAGNOSIS — I2510 Atherosclerotic heart disease of native coronary artery without angina pectoris: Secondary | ICD-10-CM

## 2011-06-11 DIAGNOSIS — E785 Hyperlipidemia, unspecified: Secondary | ICD-10-CM

## 2011-06-11 DIAGNOSIS — I10 Essential (primary) hypertension: Secondary | ICD-10-CM

## 2011-06-11 MED ORDER — CLOPIDOGREL BISULFATE 75 MG PO TABS: 75.0000 mg | ORAL_TABLET | Freq: Every day | ORAL | Status: DC

## 2011-06-11 MED ORDER — DILTIAZEM HCL ER 240 MG PO CP24: 240.0000 mg | ORAL_CAPSULE | Freq: Every evening | ORAL | Status: DC

## 2011-06-11 NOTE — Progress Notes (Signed)
Clinical Summary [name] is a 78 y.o.male presenting for followup. He was seen in January. He is here with his wife today.  He denies any chest pain since last visit, no new shortness of breath over baseline. Still is functionally limited with back pain and obesity. Has had some problems with diarrhea and malaise over the last week, also headache and back pain. States that he has had to use his pain medicine as well as Xanax.  ECG today shows sinus rhythm with PR interval 214 ms, motion artifact with nonspecific ST-T changes.  We discussed blood pressure today. He has not been checking it at home. We outlined a plan for him to do this more regularly so we can better modify his medications. We also discussed dividing his blood pressure pills over the day.   Allergies  Allergen Reactions  . Zocor (Simvastatin) Other (See Comments)    fatigue    Current Outpatient Prescriptions  Medication Sig Dispense Refill  . alprazolam (XANAX) 2 MG tablet Take 2 mg by mouth at bedtime as needed.      Marland Kitchen aspirin EC 325 MG tablet Take 325 mg by mouth daily.      . clopidogrel (PLAVIX) 75 MG tablet Take 1 tablet (75 mg total) by mouth daily with breakfast.  90 tablet  3  . diltiazem (DILACOR XR) 240 MG 24 hr capsule Take 1 capsule (240 mg total) by mouth every evening.      . DULoxetine (CYMBALTA) 60 MG capsule Take 60 mg by mouth daily.      . fenofibrate (TRICOR) 145 MG tablet Take 145 mg by mouth daily.      Marland Kitchen glipiZIDE (GLUCOTROL) 10 MG tablet Take 10 mg by mouth 2 (two) times daily before a meal.      . HYDROcodone-acetaminophen (LORTAB) 10-500 MG per tablet Take 1 tablet by mouth 3 (three) times daily as needed. For pain      . insulin glargine (LANTUS) 100 UNIT/ML injection Inject 100 Units into the skin every morning.      . isosorbide mononitrate (IMDUR) 30 MG 24 hr tablet Take 1 tablet (30 mg total) by mouth daily.  30 tablet  3  . lisinopril-hydrochlorothiazide (PRINZIDE,ZESTORETIC)  20-12.5 MG per tablet Take 1 tablet by mouth daily.      . nitroGLYCERIN (NITROLINGUAL) 0.4 MG/SPRAY spray Place 1 spray under the tongue every 5 (five) minutes as needed. For chest pain      . polyethylene glycol (MIRALAX / GLYCOLAX) packet Take 17 g by mouth daily.        Past Medical History  Diagnosis Date  . Coronary atherosclerosis of native coronary artery     DES SVG to RCA 05/08/11. CABG x 4 in 1994. Previous stent placement to SVG to distal RCA . DES to native Cx in 2006..  . Sleep apnea   . Essential hypertension, benign   . Depression     With anxious features/hx panic attacks  . Morbid obesity   . Hyperlipidemia     Not on statin 2/2 hx of intolerance.   . Nephrolithiasis   . Allergic rhinitis   . Type 2 diabetes mellitus     Social History Mr. Ilsley reports that he has been passively smoking Cigars.  He has never used smokeless tobacco. Mr. Dishner reports that he does not drink alcohol.  Review of Systems No palpitations or syncope. Stable appetite. No reported bleeding problems. Otherwise negative except as outlined.  Physical Examination Filed  Vitals:   06/11/11 1425  BP: 189/82  Pulse: 88  Resp: 18   Morbidly obese male in no acute distress.  HEENT: Conjunctiva and lids normal, oropharynx clear.  Neck: Supple, no elevated JVP or carotid bruits, no thyromegaly.  Lungs: Clear to auscultation, nonlabored breathing at rest.  Cardiac: Regular rate and rhythm, no S3, no pericardial rub.  Abdomen: Soft, nontender, bowel sounds present, no guarding or rebound.  Extremities: No pitting edema, distal pulses 1-2+.  Skin: Warm and dry.  Musculoskeletal: No kyphosis.  Neuropsychiatric: Alert and oriented x3, affect grossly appropriate.    Problem List and Plan

## 2011-06-11 NOTE — Assessment & Plan Note (Signed)
No records received. This has been followed by [facility]. He has not been able to tolerate statin therapy based on records. Optimally, LDL should be under 100.

## 2011-06-11 NOTE — Assessment & Plan Note (Signed)
Need to get a better sense for true control over time. Blood pressure medications were actually cut back during his hospitalization. Trend may be increasing. I spoke with the patient and his wife about obtaining measurements twice daily for the next few weeks, morning and evening, and dropping these by the office. We will have him take lisinopril HCTZ in the morning, Cardizem CD in the evening, and may need to further up titrate doses depending on his overall control.

## 2011-06-11 NOTE — Patient Instructions (Signed)
Follow up as scheduled. Your physician recommends that you continue on your current medications as directed. Please refer to the Current Medication list given to you today.

## 2011-06-11 NOTE — Assessment & Plan Note (Signed)
No chest pain since addition of Imdur last month. Plan to continue medical therapy and observation, including DAPT. Should strive for better blood pressure control. He is functionally limited in terms of regular exercise, states that he will not be able to do cardiac rehabilitation.

## 2011-08-04 ENCOUNTER — Encounter: Payer: Self-pay | Admitting: *Deleted

## 2011-09-03 ENCOUNTER — Other Ambulatory Visit: Payer: Self-pay | Admitting: Cardiology

## 2011-09-03 MED ORDER — ISOSORBIDE MONONITRATE ER 30 MG PO TB24: 30.0000 mg | ORAL_TABLET | Freq: Every day | ORAL | Status: DC

## 2011-09-14 ENCOUNTER — Ambulatory Visit (INDEPENDENT_AMBULATORY_CARE_PROVIDER_SITE_OTHER): Payer: Medicare Other | Admitting: Cardiology

## 2011-09-14 ENCOUNTER — Encounter: Payer: Self-pay | Admitting: Cardiology

## 2011-09-14 VITALS — BP 177/72 | HR 66 | Ht 66.0 in | Wt 286.0 lb

## 2011-09-14 DIAGNOSIS — I2510 Atherosclerotic heart disease of native coronary artery without angina pectoris: Secondary | ICD-10-CM

## 2011-09-14 DIAGNOSIS — E785 Hyperlipidemia, unspecified: Secondary | ICD-10-CM

## 2011-09-14 MED ORDER — ASPIRIN EC 81 MG PO TBEC: 81.0000 mg | DELAYED_RELEASE_TABLET | Freq: Every day | ORAL | Status: DC

## 2011-09-14 NOTE — Patient Instructions (Signed)
   Decrease Aspirin to 81mg  daily Your physician wants you to follow up in:  4 months.  You will receive a reminder letter in the mail one-two months in advance.  If you don't receive a letter, please call our office to schedule the follow up appointment

## 2011-09-14 NOTE — Progress Notes (Signed)
Clinical Summary [name] is a 78 y.o.male presenting for followup. He was seen in February. He is here with his wife. He reports no angina symptoms, only very atypical brief chest pains lasting for no more than a few seconds. This has not been progressive.  He ambulates with a walker, remains morbidly obese, and is limited by orthopedic pain. He is not exercising regularly.  He states he has not been able to take Zocor or Lipitor due to weakness. He is on TriCor at this time with followup lipids per [facility] in August.  We continue to manage him medically for CAD.   Allergies  Allergen Reactions  . Zocor (Simvastatin) Other (See Comments)    fatigue    Current Outpatient Prescriptions  Medication Sig Dispense Refill  . alprazolam (XANAX) 2 MG tablet Take 2 mg by mouth at bedtime as needed.      . clopidogrel (PLAVIX) 75 MG tablet Take 1 tablet (75 mg total) by mouth daily with breakfast.  90 tablet  3  . diltiazem (DILACOR XR) 240 MG 24 hr capsule Take 1 capsule (240 mg total) by mouth every evening.      . DULoxetine (CYMBALTA) 60 MG capsule Take 60 mg by mouth daily.      . fenofibrate (TRICOR) 145 MG tablet Take 145 mg by mouth daily.      Marland Kitchen glipiZIDE (GLUCOTROL) 10 MG tablet Take 10 mg by mouth 2 (two) times daily before a meal.      . HYDROcodone-acetaminophen (LORTAB) 10-500 MG per tablet Take 1 tablet by mouth 3 (three) times daily as needed. For pain      . insulin glargine (LANTUS) 100 UNIT/ML injection Inject 100 Units into the skin every morning.      . isosorbide mononitrate (IMDUR) 30 MG 24 hr tablet Take 1 tablet (30 mg total) by mouth daily.  30 tablet  3  . lisinopril-hydrochlorothiazide (PRINZIDE,ZESTORETIC) 20-12.5 MG per tablet Take 1 tablet by mouth daily.      . nitroGLYCERIN (NITROLINGUAL) 0.4 MG/SPRAY spray Place 1 spray under the tongue every 5 (five) minutes as needed. For chest pain      . polyethylene glycol (MIRALAX / GLYCOLAX) packet Take 17 g by  mouth daily.      Marland Kitchen aspirin EC 81 MG tablet Take 1 tablet (81 mg total) by mouth daily.        Past Medical History  Diagnosis Date  . Coronary atherosclerosis of native coronary artery     DES SVG to RCA 05/08/11. CABG x 4 in 1994. Previous stent placement to SVG to distal RCA . DES to native Cx in 2006..  . Sleep apnea   . Essential hypertension, benign   . Depression     With anxious features/hx panic attacks  . Morbid obesity   . Hyperlipidemia     Not on statin 2/2 hx of intolerance.   . Nephrolithiasis   . Allergic rhinitis   . Type 2 diabetes mellitus     Social History [name] reports that he has quit smoking. His smoking use included Cigars. He has never used smokeless tobacco. [name] reports that he does not drink alcohol.  Review of Systems No palpitations. Has trouble with his balance but no falls. No reported bleeding problems. No orthopnea. Otherwise negative.  Physical Examination Filed Vitals:   09/14/11 1415  BP: 177/72  Pulse: 45    Morbidly obese male in no acute distress.  HEENT: Conjunctiva and lids  normal, oropharynx clear.  Neck: Supple, no elevated JVP or carotid bruits, no thyromegaly.  Lungs: Clear to auscultation, nonlabored breathing at rest.  Cardiac: Regular rate and rhythm, no S3, no pericardial rub.  Abdomen: Soft, nontender, bowel sounds present, no guarding or rebound.  Extremities: No pitting edema, distal pulses 1-2+.  Skin: Warm and dry.  Musculoskeletal: No kyphosis.  Neuropsychiatric: Alert and oriented x3, affect grossly appropriate.   Problem List and Plan   Coronary atherosclerosis of native coronary artery Continue medical therapy. He states he has not been taking aspirin. Would resume aspirin 81 mg daily along with Plavix. Ambulate as tolerated. Followup arranged.  Hyperlipidemia On TriCor. Due for followup lipid panel at [facility] in August. He has had intolerances to Zocor and Lipitor. Might consider either  Pravachol or Crestor.  Morbid obesity Has not been able to lose any significant amount of weight.  Type II or unspecified type diabetes mellitus without mention of complication, uncontrolled Keep followup with [facility].     Satira Sark, M.D., F.A.C.C.

## 2011-09-14 NOTE — Assessment & Plan Note (Signed)
On TriCor. Due for followup lipid panel at [place] in August. He has had intolerances to Zocor and Lipitor. Might consider either Pravachol or Crestor.

## 2011-09-14 NOTE — Assessment & Plan Note (Signed)
Has not been able to lose any significant amount of weight.

## 2011-09-14 NOTE — Assessment & Plan Note (Signed)
Continue medical therapy. He states he has not been taking aspirin. Would resume aspirin 81 mg daily along with Plavix. Ambulate as tolerated. Followup arranged.

## 2011-09-14 NOTE — Assessment & Plan Note (Signed)
Keep followup with [facility].

## 2012-01-29 ENCOUNTER — Other Ambulatory Visit: Payer: Self-pay | Admitting: *Deleted

## 2012-01-29 MED ORDER — ISOSORBIDE MONONITRATE ER 30 MG PO TB24: 30.0000 mg | ORAL_TABLET | Freq: Every day | ORAL | Status: DC

## 2012-06-02 ENCOUNTER — Other Ambulatory Visit: Payer: Self-pay | Admitting: Cardiology

## 2012-06-02 MED ORDER — CLOPIDOGREL BISULFATE 75 MG PO TABS: 75.0000 mg | ORAL_TABLET | Freq: Every day | ORAL | Status: DC

## 2012-06-03 ENCOUNTER — Other Ambulatory Visit: Payer: Self-pay | Admitting: Cardiology

## 2012-06-03 MED ORDER — ISOSORBIDE MONONITRATE ER 30 MG PO TB24: 30.0000 mg | ORAL_TABLET | Freq: Every day | ORAL | Status: DC

## 2012-08-08 ENCOUNTER — Inpatient Hospital Stay (HOSPITAL_COMMUNITY)
Admission: EM | Admit: 2012-08-08 | Discharge: 2012-08-12 | DRG: 247 | Disposition: A | Discharge status: Home Health | Payer: Medicare Other | Attending: Cardiovascular Disease | Admitting: Cardiovascular Disease

## 2012-08-08 ENCOUNTER — Encounter (HOSPITAL_COMMUNITY): Admission: EM | Disposition: A | Discharge status: Home Health | Payer: Self-pay | Attending: Cardiovascular Disease

## 2012-08-08 ENCOUNTER — Encounter (HOSPITAL_COMMUNITY): Payer: Self-pay | Admitting: *Deleted

## 2012-08-08 DIAGNOSIS — R197 Diarrhea, unspecified: Secondary | ICD-10-CM

## 2012-08-08 DIAGNOSIS — A088 Other specified intestinal infections: Secondary | ICD-10-CM

## 2012-08-08 DIAGNOSIS — R5381 Other malaise: Secondary | ICD-10-CM | POA: Diagnosis present

## 2012-08-08 DIAGNOSIS — E118 Type 2 diabetes mellitus with unspecified complications: Secondary | ICD-10-CM | POA: Diagnosis present

## 2012-08-08 DIAGNOSIS — I25810 Atherosclerosis of coronary artery bypass graft(s) without angina pectoris: Secondary | ICD-10-CM

## 2012-08-08 DIAGNOSIS — I2510 Atherosclerotic heart disease of native coronary artery without angina pectoris: Secondary | ICD-10-CM

## 2012-08-08 DIAGNOSIS — IMO0002 Reserved for concepts with insufficient information to code with codable children: Secondary | ICD-10-CM | POA: Diagnosis present

## 2012-08-08 DIAGNOSIS — K529 Noninfective gastroenteritis and colitis, unspecified: Secondary | ICD-10-CM

## 2012-08-08 DIAGNOSIS — F3289 Other specified depressive episodes: Secondary | ICD-10-CM | POA: Diagnosis present

## 2012-08-08 DIAGNOSIS — I2119 ST elevation (STEMI) myocardial infarction involving other coronary artery of inferior wall: Principal | ICD-10-CM | POA: Diagnosis present

## 2012-08-08 DIAGNOSIS — G4730 Sleep apnea, unspecified: Secondary | ICD-10-CM | POA: Diagnosis present

## 2012-08-08 DIAGNOSIS — Z6841 Body Mass Index (BMI) 40.0 and over, adult: Secondary | ICD-10-CM

## 2012-08-08 DIAGNOSIS — G4733 Obstructive sleep apnea (adult) (pediatric): Secondary | ICD-10-CM | POA: Diagnosis present

## 2012-08-08 DIAGNOSIS — F329 Major depressive disorder, single episode, unspecified: Secondary | ICD-10-CM | POA: Diagnosis present

## 2012-08-08 DIAGNOSIS — Z87891 Personal history of nicotine dependence: Secondary | ICD-10-CM

## 2012-08-08 DIAGNOSIS — Z9861 Coronary angioplasty status: Secondary | ICD-10-CM

## 2012-08-08 DIAGNOSIS — I10 Essential (primary) hypertension: Secondary | ICD-10-CM | POA: Diagnosis present

## 2012-08-08 DIAGNOSIS — E1122 Type 2 diabetes mellitus with diabetic chronic kidney disease: Secondary | ICD-10-CM | POA: Diagnosis present

## 2012-08-08 DIAGNOSIS — Z951 Presence of aortocoronary bypass graft: Secondary | ICD-10-CM

## 2012-08-08 DIAGNOSIS — I5033 Acute on chronic diastolic (congestive) heart failure: Secondary | ICD-10-CM

## 2012-08-08 DIAGNOSIS — E785 Hyperlipidemia, unspecified: Secondary | ICD-10-CM | POA: Diagnosis present

## 2012-08-08 DIAGNOSIS — E662 Morbid (severe) obesity with alveolar hypoventilation: Secondary | ICD-10-CM | POA: Diagnosis present

## 2012-08-08 HISTORY — PX: LEFT HEART CATHETERIZATION WITH CORONARY ANGIOGRAM: SHX5451

## 2012-08-08 HISTORY — PX: PERCUTANEOUS CORONARY STENT INTERVENTION (PCI-S): SHX5485

## 2012-08-08 LAB — MRSA PCR SCREENING: MRSA by PCR: NEGATIVE

## 2012-08-08 LAB — GLUCOSE, CAPILLARY: Glucose-Capillary: 236 mg/dL — ABNORMAL HIGH (ref 70–99)

## 2012-08-08 SURGERY — LEFT HEART CATHETERIZATION WITH CORONARY ANGIOGRAM
Anesthesia: LOCAL

## 2012-08-08 MED ORDER — ONDANSETRON HCL 4 MG/2ML IJ SOLN
4.0000 mg | Freq: Four times a day (QID) | INTRAMUSCULAR | Status: DC | PRN
  Administered 2012-08-09 – 2012-08-12 (×4): 4 mg via INTRAVENOUS
  Filled 2012-08-08 (×6): qty 2

## 2012-08-08 MED ORDER — LISINOPRIL-HYDROCHLOROTHIAZIDE 20-12.5 MG PO TABS: 1.0000 | ORAL_TABLET | Freq: Every day | ORAL | Status: DC

## 2012-08-08 MED ORDER — INSULIN GLARGINE 100 UNIT/ML ~~LOC~~ SOLN
100.0000 [IU] | Freq: Every day | SUBCUTANEOUS | Status: DC
  Administered 2012-08-09 – 2012-08-10 (×2): 100 [IU] via SUBCUTANEOUS
  Filled 2012-08-08 (×3): qty 1

## 2012-08-08 MED ORDER — NITROGLYCERIN 0.4 MG SL SUBL: 0.4000 mg | SUBLINGUAL_TABLET | SUBLINGUAL | Status: DC | PRN

## 2012-08-08 MED ORDER — DULOXETINE HCL 60 MG PO CPEP
60.0000 mg | ORAL_CAPSULE | Freq: Every day | ORAL | Status: DC
  Administered 2012-08-09 – 2012-08-12 (×3): 60 mg via ORAL
  Filled 2012-08-08 (×4): qty 1

## 2012-08-08 MED ORDER — POLYETHYLENE GLYCOL 3350 17 G PO PACK
17.0000 g | PACK | Freq: Every day | ORAL | Status: DC
  Administered 2012-08-09 – 2012-08-10 (×2): 17 g via ORAL
  Filled 2012-08-08 (×4): qty 1

## 2012-08-08 MED ORDER — NON FORMULARY
20.0000 mg | Freq: Every day | Status: DC
Start: 2012-08-08 — End: 2012-08-08

## 2012-08-08 MED ORDER — HYDROCODONE-ACETAMINOPHEN 5-325 MG PO TABS
2.0000 | ORAL_TABLET | Freq: Four times a day (QID) | ORAL | Status: DC | PRN
  Administered 2012-08-08 – 2012-08-12 (×6): 2 via ORAL
  Filled 2012-08-08: qty 1
  Filled 2012-08-08 (×6): qty 2

## 2012-08-08 MED ORDER — DILTIAZEM HCL ER 240 MG PO CP24
240.0000 mg | ORAL_CAPSULE | Freq: Every evening | ORAL | Status: DC
  Administered 2012-08-09 – 2012-08-12 (×4): 240 mg via ORAL
  Filled 2012-08-08 (×4): qty 1

## 2012-08-08 MED ORDER — ACETAMINOPHEN 325 MG PO TABS
650.0000 mg | ORAL_TABLET | ORAL | Status: DC | PRN
  Administered 2012-08-09: 650 mg via ORAL
  Filled 2012-08-08: qty 2

## 2012-08-08 MED ORDER — SODIUM CHLORIDE 0.9 % IV SOLN
0.2500 mg/kg/h | INTRAVENOUS | Status: AC
  Filled 2012-08-08: qty 250

## 2012-08-08 MED ORDER — ASPIRIN EC 81 MG PO TBEC
81.0000 mg | DELAYED_RELEASE_TABLET | Freq: Every day | ORAL | Status: DC
  Administered 2012-08-09 – 2012-08-12 (×4): 81 mg via ORAL
  Filled 2012-08-08 (×4): qty 1

## 2012-08-08 MED ORDER — FENOFIBRATE 54 MG PO TABS
54.0000 mg | ORAL_TABLET | Freq: Every day | ORAL | Status: DC
  Administered 2012-08-09 – 2012-08-12 (×4): 54 mg via ORAL
  Filled 2012-08-08 (×4): qty 1

## 2012-08-08 MED ORDER — HYDROCHLOROTHIAZIDE 12.5 MG PO CAPS
12.5000 mg | ORAL_CAPSULE | Freq: Every day | ORAL | Status: DC
  Administered 2012-08-09: 12.5 mg via ORAL
  Filled 2012-08-08 (×2): qty 1

## 2012-08-08 MED ORDER — ALPRAZOLAM 0.5 MG PO TABS
2.0000 mg | ORAL_TABLET | Freq: Every evening | ORAL | Status: DC | PRN
  Administered 2012-08-08 – 2012-08-11 (×4): 2 mg via ORAL
  Filled 2012-08-08 (×5): qty 4

## 2012-08-08 MED ORDER — ISOSORBIDE MONONITRATE ER 30 MG PO TB24
30.0000 mg | ORAL_TABLET | Freq: Every day | ORAL | Status: DC
  Administered 2012-08-09 – 2012-08-12 (×4): 30 mg via ORAL
  Filled 2012-08-08 (×4): qty 1

## 2012-08-08 MED ORDER — SODIUM CHLORIDE 0.9 % IV SOLN: INTRAVENOUS | Status: AC

## 2012-08-08 MED ORDER — ROSUVASTATIN CALCIUM 20 MG PO TABS
20.0000 mg | ORAL_TABLET | Freq: Every day | ORAL | Status: DC
  Administered 2012-08-09 – 2012-08-11 (×3): 20 mg via ORAL
  Filled 2012-08-08 (×4): qty 1

## 2012-08-08 MED ORDER — LISINOPRIL 20 MG PO TABS
20.0000 mg | ORAL_TABLET | Freq: Every day | ORAL | Status: DC
  Administered 2012-08-09 – 2012-08-12 (×4): 20 mg via ORAL
  Filled 2012-08-08 (×4): qty 1

## 2012-08-08 MED ORDER — GLIPIZIDE 10 MG PO TABS
10.0000 mg | ORAL_TABLET | Freq: Two times a day (BID) | ORAL | Status: DC
  Administered 2012-08-09 – 2012-08-12 (×8): 10 mg via ORAL
  Filled 2012-08-08 (×10): qty 1

## 2012-08-08 MED ORDER — CLOPIDOGREL BISULFATE 75 MG PO TABS
75.0000 mg | ORAL_TABLET | Freq: Every day | ORAL | Status: DC
  Administered 2012-08-09 – 2012-08-12 (×4): 75 mg via ORAL
  Filled 2012-08-08 (×4): qty 1

## 2012-08-08 NOTE — CV Procedure (Signed)
Cardiac Catheterization Operative Report  EBIN MENNINGER [id number] 4/28/20147:21 PM SKILLMAN,KATIE, PA-C  Procedure Performed:  1. Left Heart Catheterization 2. Selective Coronary Angiography 3. Left ventricular angiogram 4. SVG angiography 5. LIMA graft angiography 6. PTCA/DES x 1 mid body of SVG to PDA  Operator: Lauree Chandler, MD  Indication:  79 yo male with history of CAD s/p CABG 1994, obesity, HTN, HLD, DM transferred from [facility] in [place] with inferior STEMI.                                    Procedure Details: The risks, benefits, complications, treatment options, and expected outcomes were discussed with the patient. The patient and/or family concurred with the proposed plan, giving verbal informed consent. (emergency consent placed on chart).  The patient was brought to the cath lab via EMS. The patient was further sedated with Versed and Fentanyl. The right groin was prepped and draped in the usual manner. Using the modified Seldinger access technique, a 6 French sheath was placed in the right femoral artery. Standard diagnostic catheters were used to perform selective coronary angiography. The JR-4 catheter was used to engage the SVG to the RCA and the LIMA graft to the LAD. A pigtail catheter was used to perform a left ventricular angiogram. He was found to have a high grade stenosis of the SVG to the PDA.   There were no immediate complications. The patient was taken to the recovery area in stable condition.   Hemodynamic Findings: Central aortic pressure: 149/63 Left ventricular pressure: 169/9/21  Angiographic Findings:  Left mainstem: Long mid and distal calcified 25% stenosis.   Left anterior descending (LAD): Diffuse proximal vessel disease. 100% occlusion in the mid vessel. Heavy calcification. The distal vessel fills via the LIMA and appears to be free of high-grade disease. There is a diagonal that is occluded and no longer fills via the  vein graft.   Left circumflex (LCx): There is a ramus intermediate that is moderate-sized. It is branching. There is moderate calcification. There is diffuse 60-70% stenosis in a somewhat narrow caliber vessel which is unchanged from last cath.  The proximal AV groove has a stent with mild luminal irregularities but is otherwise widely patent. There is a step off in the caliber of the vessel immediately after the stent with long 50% stenosis leading into a large obtuse marginal. The obtuse marginal is free of high-grade disease.   Right coronary artery (RCA): Occluded at the proximal segment. The distal vessel fills via the sequential vein graft.   Graft Anatomy:  LIMA to LAD: Widely patent  SVG to Diag:  Known to be occluded (as demonstrated in the previous catheterization)  SVG to distal RCA/PL: The proximal stents are widely patent. The mid stented segment in the body of SVG has a 99% hazy stenosis    Left ventriculography: Left ventricular systolic function is normal with LVEF 50-55%, inferoapical hypokinesis.   Impression: 1. Acute inferior STEMI secondary to thrombotic lesion in mid body of SVG to PDA, now s/p successful PTCA/DES x 1 mid body of SVG to PDA 2. Preserved LV systolic function 3. Triple vessel CAD s/p 5V CABG with 3/5 patent bypass grafts  Recommendations: Admit to CCU. ASA and Plavix for lifetime. Will start Crestor. Does not tolerate Zocor or Lipitor.        Complications:  None. The patient tolerated the procedure well.

## 2012-08-08 NOTE — H&P (Signed)
   Patient ID: [name] MRN: [id number] DOB/AGE: [birth year] 79 y.o. Admit date: 08/08/12  Primary Cardiologist: [name]  HPI: 79 yo male with history of obesity, CAD s/p CABG and previous stenting procedures, DM, HTN, HLD transferred from [facility] with chest pain and ST elevation in inferior leads, concering for acute inferior STEMI. Pain started today around 5:15pm and did not resolve with SL NTG. C/O 6/10 chest pain upon arrival to [facility] with back pain and SOB.   Review of systems complete and found to be negative unless listed above   Past Medical History  Diagnosis Date  . Coronary atherosclerosis of native coronary artery     DES SVG to RCA 05/08/11. CABG x 4 in 1994. Previous stent placement to SVG to distal RCA . DES to native Cx in 2006..  . Sleep apnea   . Essential hypertension, benign   . Depression     With anxious features/hx panic attacks  . Morbid obesity   . Hyperlipidemia     Not on statin 2/2 hx of intolerance.   . Nephrolithiasis   . Allergic rhinitis   . Type 2 diabetes mellitus     Family History  Problem Relation Age of Onset  . Heart attack Mother     Age 39    History   Social History  . Marital Status: Married    Spouse Name: N/A    Number of Children: N/A  . Years of Education: N/A   Occupational History  . Not on file.   Social History Main Topics  . Smoking status: Former Smoker -- 3 years    Types: Cigars  . Smokeless tobacco: Never Used  . Alcohol Use: No  . Drug Use: No  . Sexually Active: No   Other Topics Concern  . Not on file   Social History Narrative  . No narrative on file    Past Surgical History  Procedure Laterality Date  . Coronary artery bypass graft  1994  . Eye surgery      Allergies  Allergen Reactions  . Zocor (Simvastatin) Other (See Comments)    fatigue    Prior to Admission Meds:   Physical Exam: BP 149/63  HR 70 RR 16  General: Well developed, well nourished, NAD  HEENT: OP  clear, mucus membranes moist  SKIN: warm, dry. No rashes.  Neuro: No focal deficits  Musculoskeletal: Muscle strength 5/5 all ext  Psychiatric: Mood and affect normal  Neck: No JVD, no carotid bruits, no thyromegaly, no lymphadenopathy.  Lungs:Clear bilaterally, no wheezes, rhonci, crackles  Cardiovascular: Regular rate and rhythm. No murmurs, gallops or rubs.  Abdomen:Soft. Bowel sounds present. Non-tender.  Extremities: No lower extremity edema. Pulses are 2 + in the bilateral DP/PT.  EKG:   ASSESSMENT AND PLAN:   1. Inferior STEMI: Emergent cardiac cath with possible PCI.   2. CAD: as above. S/p CABG   [name] 08/08/2012, 8:44 PM

## 2012-08-09 DIAGNOSIS — I10 Essential (primary) hypertension: Secondary | ICD-10-CM

## 2012-08-09 LAB — COMPREHENSIVE METABOLIC PANEL
ALT: 34 U/L (ref 0–53)
BUN: 24 mg/dL — ABNORMAL HIGH (ref 6–23)
CO2: 29 mEq/L (ref 19–32)
Calcium: 9.2 mg/dL (ref 8.4–10.5)
Creatinine, Ser: 1.05 mg/dL (ref 0.50–1.35)
GFR calc Af Amer: 77 mL/min — ABNORMAL LOW (ref >90)
GFR calc non Af Amer: 66 mL/min — ABNORMAL LOW (ref >90)
Glucose, Bld: 306 mg/dL — ABNORMAL HIGH (ref 70–99)
Sodium: 136 mEq/L (ref 135–145)
Total Protein: 6.4 g/dL (ref 6.0–8.3)

## 2012-08-09 LAB — CBC WITH DIFFERENTIAL/PLATELET
Eosinophils Absolute: 0.1 10*3/uL (ref 0.0–0.7)
Eosinophils Relative: 1 % (ref 0–5)
HCT: 41.3 % (ref 39.0–52.0)
Lymphocytes Relative: 25 % (ref 12–46)
Lymphs Abs: 1.9 10*3/uL (ref 0.7–4.0)
MCH: 28.5 pg (ref 26.0–34.0)
MCV: 85.9 fL (ref 78.0–100.0)
Monocytes Absolute: 0.8 10*3/uL (ref 0.1–1.0)
Platelets: 176 10*3/uL (ref 150–400)
RBC: 4.81 MIL/uL (ref 4.22–5.81)
RDW: 14.2 % (ref 11.5–15.5)
WBC: 7.4 10*3/uL (ref 4.0–10.5)

## 2012-08-09 LAB — CBC
HCT: 42.5 % (ref 39.0–52.0)
MCH: 28.9 pg (ref 26.0–34.0)
MCV: 87.8 fL (ref 78.0–100.0)
Platelets: 183 10*3/uL (ref 150–400)
RDW: 14.4 % (ref 11.5–15.5)

## 2012-08-09 LAB — BASIC METABOLIC PANEL
BUN: 26 mg/dL — ABNORMAL HIGH (ref 6–23)
Calcium: 9.4 mg/dL (ref 8.4–10.5)
Creatinine, Ser: 1.13 mg/dL (ref 0.50–1.35)
GFR calc Af Amer: 70 mL/min — ABNORMAL LOW (ref >90)

## 2012-08-09 LAB — LIPID PANEL
Cholesterol: 199 mg/dL (ref 0–200)
HDL: 38 mg/dL — ABNORMAL LOW (ref >39)
Total CHOL/HDL Ratio: 5.2 RATIO

## 2012-08-09 LAB — GLUCOSE, CAPILLARY
Glucose-Capillary: 262 mg/dL — ABNORMAL HIGH (ref 70–99)
Glucose-Capillary: 248 mg/dL — ABNORMAL HIGH (ref 70–99)

## 2012-08-09 LAB — POCT ACTIVATED CLOTTING TIME: Activated Clotting Time: 584 seconds

## 2012-08-09 LAB — TROPONIN I: Troponin I: 0.48 ng/mL (ref <0.30)

## 2012-08-09 MED ORDER — INSULIN ASPART 100 UNIT/ML ~~LOC~~ SOLN
0.0000 [IU] | Freq: Three times a day (TID) | SUBCUTANEOUS | Status: DC
  Administered 2012-08-09 (×3): 11 [IU] via SUBCUTANEOUS
  Administered 2012-08-10 (×2): 7 [IU] via SUBCUTANEOUS

## 2012-08-09 MED ORDER — INSULIN ASPART 100 UNIT/ML ~~LOC~~ SOLN
0.0000 [IU] | Freq: Every day | SUBCUTANEOUS | Status: DC
  Administered 2012-08-09 – 2012-08-10 (×2): 2 [IU] via SUBCUTANEOUS

## 2012-08-09 MED ORDER — METOPROLOL TARTRATE 25 MG PO TABS
25.0000 mg | ORAL_TABLET | Freq: Two times a day (BID) | ORAL | Status: DC
  Administered 2012-08-09 – 2012-08-12 (×7): 25 mg via ORAL
  Filled 2012-08-09 (×8): qty 1

## 2012-08-09 MED FILL — Sodium Chloride IV Soln 0.9%: INTRAVENOUS | Qty: 50 | Status: AC

## 2012-08-09 MED FILL — Lidocaine HCl Local Preservative Free (PF) Inj 1%: INTRAMUSCULAR | Qty: 30 | Status: AC

## 2012-08-09 MED FILL — Heparin Sodium (Porcine) 2 Unit/ML in Sodium Chloride 0.9%: INTRAMUSCULAR | Qty: 1000 | Status: AC

## 2012-08-09 MED FILL — Midazolam HCl Inj 2 MG/2ML (Base Equivalent): INTRAMUSCULAR | Qty: 2 | Status: AC

## 2012-08-09 MED FILL — Fentanyl Citrate Inj 0.05 MG/ML: INTRAMUSCULAR | Qty: 2 | Status: AC

## 2012-08-09 MED FILL — Bivalirudin Trifluoroacetate For IV Soln 250 MG (Base Equiv): INTRAVENOUS | Qty: 250 | Status: AC

## 2012-08-09 NOTE — Progress Notes (Signed)
Pt asleep on cpap with O2 @ 2L , O2 sats down to 87%. Increased O2 up to 5L, O2 sat 90%. Pt awakens easily. Denies SOB. Notified RT, cpap settings checked. Will monitor.

## 2012-08-09 NOTE — Progress Notes (Signed)
Inpatient Diabetes Program Recommendations  AACE/ADA: New Consensus Statement on Inpatient Glycemic Control (2013)  Target Ranges:  Prepandial:   less than 140 mg/dL      Peak postprandial:   less than 180 mg/dL (1-2 hours)      Critically ill patients:  140 - 180 mg/dL   Reason for Assessment: Sub-optimal diabetes control in the hospital Results for [name], [name] (MRN [id number]) as of 08/09/2012 13:27  Ref. Range 08/08/2012 22:30 08/09/2012 03:41 08/09/2012 07:33 08/09/2012 11:48  Glucose-Capillary Latest Range: 70-99 mg/dL 236 (H) 280 (H) 293 (H) 262 (H)    Note:  Patient started on home Lantus and Glucotrol today.  Request MD order for Hbg A1C to assess glycemic control prior to admission.  Thank you.  Crescent Gotham S. Marcelline Mates, RN, CNS, CDE Inpatient Diabetes Program, team pager [phone]

## 2012-08-09 NOTE — Progress Notes (Addendum)
    SUBJECTIVE: No chest pain or SOB. No events overnight.   BP 171/59  Pulse 85  Temp(Src) 99.4 F (37.4 C) (Oral)  Resp 16  Ht 5\' 5"  (1.651 m)  Wt 293 lb 6.9 oz (133.1 kg)  BMI 48.83 kg/m2  SpO2 89%  Intake/Output Summary (Last 24 hours) at 08/09/12 0708 Last data filed at 08/09/12 0500  Gross per 24 hour  Intake 777.38 ml  Output    400 ml  Net 377.38 ml    PHYSICAL EXAM General: Well developed, well nourished, in no acute distress. Alert and oriented x 3.  Psych:  Good affect, responds appropriately Neck: No JVD. No masses noted.  Lungs: Clear bilaterally with no wheezes or rhonci noted.  Heart: RRR with no murmurs noted. Abdomen: Bowel sounds are present. Soft, non-tender.  Extremities: No lower extremity edema. Right groin ok without hematoma.   LABS: Basic Metabolic Panel:  Recent Labs  08/09/12 0047  NA 136  K 4.6  CL 98  CO2 29  GLUCOSE 306*  BUN 24*  CREATININE 1.05  CALCIUM 9.2   CBC:  Recent Labs  08/09/12 0047  WBC 7.4  NEUTROABS 4.7  HGB 13.7  HCT 41.3  MCV 85.9  PLT 176   Cardiac Enzymes:  Recent Labs  08/09/12 0045  TROPONINI 0.48*   Current Meds: . aspirin EC  81 mg Oral Daily  . clopidogrel  75 mg Oral Q breakfast  . diltiazem  240 mg Oral QPM  . DULoxetine  60 mg Oral Daily  . fenofibrate  54 mg Oral Daily  . glipiZIDE  10 mg Oral BID AC  . hydrochlorothiazide  12.5 mg Oral Daily  . insulin glargine  100 Units Subcutaneous Daily  . isosorbide mononitrate  30 mg Oral Daily  . lisinopril  20 mg Oral Daily  . polyethylene glycol  17 g Oral Daily  . rosuvastatin  20 mg Oral q1800     ASSESSMENT AND PLAN:  1. Inferior STEMI: Pt admitted last night with acute inferior STEMI. Found to have hazy, thrombotic stenosis of the stented segment in the mid body of the SVG to the PDA. Now s/p DES x 1 mid body of SVG to PDA. Doing well this am. Will continue ASA and Plavix. Will check P2Y12 later today. Continue statin. Will add low  dose beta blocker.    2. HTN: Will titrate meds. Will add Lopressor 25 mg po BID  3. DM: Continue current regimen.   Kaeden Mester  4/29/20147:08 AM

## 2012-08-09 NOTE — Progress Notes (Signed)
Patient's troponin increasing, patient vomiting and having diarrhea.  Dr. Angelena Form notified & Zofran given.  Will continue to monitor. [place], Ardeth Sportsman

## 2012-08-09 NOTE — Care Management Note (Addendum)
    Page 1 of 2   08/12/2012     1:58:33 PM   CARE MANAGEMENT NOTE 08/12/2012  Patient:  [name], [name]   Account Number:  [id number]  Date Initiated:  08/09/2012  Documentation initiated by:  Elissa Hefty  Subjective/Objective Assessment:   adm w mi     Action/Plan:   lives w wife, pcp dr Eddie Dibbles sasser   Anticipated DC Date:     Anticipated DC Plan:  [place]  CM consult      PAC Choice  [place]   Choice offered to / List presented to:  C-1 Patient   DME arranged  [facility]      DME agency  [facility] arranged  HH-1 RN  [place] PT      [place].   Status of service:  Completed, signed off Medicare Important Message given?   (If response is "NO", the following Medicare IM given date fields will be blank) Date Medicare IM given:   Date Additional Medicare IM given:    Discharge Disposition:  [place]  Per UR Regulation:  Reviewed for med. necessity/level of care/duration of stay  If discussed at [place] of Stay Meetings, dates discussed:    Comments:  08-12-12 [place], [place] [phone] Pt will need portable tank for d/c home. CM did make referral for [facility] services RN and PT. SOC to begin within 24-48 hours of d/c. CM did place a call to [facility] to see if delivery of 02 will be in a timely manner. CM will update wife and will make decision on 02. 02 ordered from [place]. No further needs form CM.   08-12-12 [address], RN,BSN [phone] CM did speak to pt's wife in reference to [facility] services. Pt is from home with wife and she is the caregiver. Plan for PT to consult and make recommendations. If pt to d/c today with [facility] services he will benefit from [facility] services. Per wife pt has 02 at home at 2L, cpap and RW. Will f/u post PT consult.   4/29 0840 [name] dowell  rn,bsn

## 2012-08-09 NOTE — Progress Notes (Signed)
CARDIAC REHAB PHASE I   PRE:  Rate/Rhythm: 78SR  BP:  Supine:   Sitting: 147/72  Standing:    SaO2: 92%RA ear  MODE:  Ambulation: 132 ft   POST:  Rate/Rhythm: 86SR  BP:  Supine:   Sitting: 153/69  Standing:    SaO2: 85%RA at times during walk but would go to above 90% with rest 1312-1330 Came to see pt to walk. Sleeping on CPAP. Pt's wife stated he had been nauseated earlier. Gave stent booklet to family. Will return later. 1430-1455 Pt sitting on side of bed eating lunch. Sats did not register well on fingers but did on ear. Pt walked 132 ft on RA with rolling walker and asst x 2. Monitored sats whole walk. Pt would dip to 85%RA during walk but come up to 92% with rest. Denied Cp. To sitting on side of bed after walk.   Graylon Good, RN BSN  08/09/2012 3:06 PM

## 2012-08-10 ENCOUNTER — Inpatient Hospital Stay (HOSPITAL_COMMUNITY): Payer: Medicare Other

## 2012-08-10 LAB — PLATELET INHIBITION P2Y12: Platelet Function  P2Y12: 174 [PRU] — ABNORMAL LOW (ref 194–418)

## 2012-08-10 LAB — BASIC METABOLIC PANEL
Calcium: 9.1 mg/dL (ref 8.4–10.5)
Chloride: 95 mEq/L — ABNORMAL LOW (ref 96–112)
Creatinine, Ser: 1.17 mg/dL (ref 0.50–1.35)
GFR calc Af Amer: 68 mL/min — ABNORMAL LOW (ref >90)
Sodium: 131 mEq/L — ABNORMAL LOW (ref 135–145)

## 2012-08-10 LAB — GLUCOSE, CAPILLARY
Glucose-Capillary: 240 mg/dL — ABNORMAL HIGH (ref 70–99)
Glucose-Capillary: 216 mg/dL — ABNORMAL HIGH (ref 70–99)
Glucose-Capillary: 245 mg/dL — ABNORMAL HIGH (ref 70–99)

## 2012-08-10 LAB — CBC
Hemoglobin: 14.3 g/dL (ref 13.0–17.0)
MCH: 29.1 pg (ref 26.0–34.0)
MCHC: 33.2 g/dL (ref 30.0–36.0)
Platelets: 184 10*3/uL (ref 150–400)

## 2012-08-10 MED ORDER — GUAIFENESIN-DM 100-10 MG/5ML PO SYRP
5.0000 mL | ORAL_SOLUTION | ORAL | Status: DC | PRN
  Administered 2012-08-10: 5 mL via ORAL
  Filled 2012-08-10: qty 5

## 2012-08-10 MED ORDER — HYDROCHLOROTHIAZIDE 12.5 MG PO CAPS
25.0000 mg | ORAL_CAPSULE | Freq: Every day | ORAL | Status: DC
  Administered 2012-08-10 – 2012-08-12 (×3): 25 mg via ORAL
  Filled 2012-08-10 (×3): qty 2

## 2012-08-10 NOTE — Progress Notes (Signed)
SUBJECTIVE: Nausea overnight with diarrhea. No chest pain or SOB. Desats yesterday with ambulation.   BP 156/60  Pulse 72  Temp(Src) 98.3 F (36.8 C) (Oral)  Resp 18  Ht 5\' 5"  (1.651 m)  Wt 293 lb 6.9 oz (133.1 kg)  BMI 48.83 kg/m2  SpO2 95%  Intake/Output Summary (Last 24 hours) at 08/10/12 0736 Last data filed at 08/09/12 1700  Gross per 24 hour  Intake    490 ml  Output    100 ml  Net    390 ml    PHYSICAL EXAM General: Well developed, well nourished, in no acute distress. Alert and oriented x 3.  Psych:  Good affect, responds appropriately Neck: No JVD. No masses noted.  Lungs: Clear bilaterally with no wheezes or rhonci noted.  Heart: RRR with no murmurs noted. Abdomen: Bowel sounds are present. Soft, non-tender.  Extremities: No lower extremity edema.   LABS: Basic Metabolic Panel:  Recent Labs  08/09/12 1022 08/10/12 0550  NA 133* 131*  K 4.8 5.3*  CL 97 95*  CO2 26 29  GLUCOSE 286* 284*  BUN 26* 31*  CREATININE 1.13 1.17  CALCIUM 9.4 9.1   CBC:  Recent Labs  08/09/12 0047 08/09/12 1022 08/10/12 0550  WBC 7.4 9.9 10.7*  NEUTROABS 4.7  --   --   HGB 13.7 14.0 14.3  HCT 41.3 42.5 43.1  MCV 85.9 87.8 87.8  PLT 176 183 184   Cardiac Enzymes:  Recent Labs  08/09/12 0045 08/09/12 1022 08/09/12 1558  TROPONINI 0.48* 1.52* 1.72*   Fasting Lipid Panel:  Recent Labs  08/09/12 1022  CHOL 199  HDL 38*  LDLCALC 110*  TRIG 255*  CHOLHDL 5.2    Current Meds: . aspirin EC  81 mg Oral Daily  . clopidogrel  75 mg Oral Q breakfast  . diltiazem  240 mg Oral QPM  . DULoxetine  60 mg Oral Daily  . fenofibrate  54 mg Oral Daily  . glipiZIDE  10 mg Oral BID AC  . hydrochlorothiazide  12.5 mg Oral Daily  . insulin aspart  0-20 Units Subcutaneous TID WC  . insulin aspart  0-5 Units Subcutaneous QHS  . insulin glargine  100 Units Subcutaneous Daily  . isosorbide mononitrate  30 mg Oral Daily  . lisinopril  20 mg Oral Daily  . metoprolol  tartrate  25 mg Oral BID  . polyethylene glycol  17 g Oral Daily  . rosuvastatin  20 mg Oral q1800     ASSESSMENT AND PLAN:  1. Inferior STEMI: Pt admitted 08/08/12 with acute inferior STEMI. Found to have hazy, thrombotic stenosis of the stented segment in the mid body of the SVG to the PDA. Now s/p DES x 1 mid body of SVG to PDA. Nausea and vomiting with diarrhea over last 24 hours. Sleeping this am and reports that N/V is ok. Will continue ASA and Plavix. P2Y12 is pending this am.  Continue statin and beta blocker.    2. HTN: Continue current meds. Will increase HCTZ today.    3. DM: Continue current regimen. SSI. Appreciate Diabetes management team recs. Will check HgbA1C this am.   4. Hypoxemia: I suspect that he has some degree of lung disease with past tobacco abuse and his body habitus suggests OSA/obesity hypoventilation syndrome. Will get CXR today. He will need PFTs before discharge but will leave. Will ambulate today on RA again to see if there is an improvement.  Harleyquinn Gasser  4/30/20147:36 AM

## 2012-08-10 NOTE — Progress Notes (Signed)
The patients son-in-law just died today who also had a bad heart kids found him dead at home. Family has not told patient at this time. Family also wondering about if the patient would make it to the funeral at this time. Thanks!

## 2012-08-10 NOTE — Progress Notes (Signed)
Inpatient Diabetes Program Recommendations  AACE/ADA: New Consensus Statement on Inpatient Glycemic Control (2013)  Target Ranges:  Prepandial:   less than 140 mg/dL      Peak postprandial:   less than 180 mg/dL (1-2 hours)      Critically ill patients:  140 - 180 mg/dL     Results for KHAMIR, APPLIN (MRN [id number]) as of 08/10/2012 15:03  Ref. Range 08/09/2012 07:33 08/09/2012 11:48 08/09/2012 16:38 08/09/2012 21:11  Glucose-Capillary Latest Range: 70-99 mg/dL 293 (H) 262 (H) 248 (H) 205 (H)    Results for MARKEN, KINTZ (MRN [id number]) as of 08/10/2012 15:03  Ref. Range 08/10/2012 07:20 08/10/2012 11:46  Glucose-Capillary Latest Range: 70-99 mg/dL 261 (H) 240 (H)    Fasting glucose still elevated.  Postprandial CBGs also remain elevated.  Noted A1c ordered.  Recommend the following adjustments:  Inpatient Diabetes Program Recommendations Insulin - Basal: Please change Lantus to the following: Lantus 55 units bid (start tomorrow AM-05/01 at 10am) Correction (SSI): Please reduce correction scale (SSI) to Moderate scale if meal coverage added. Insulin - Meal Coverage: Please consider adding Novolog meal coverage- Novolog 6 units tid with meals.    Will follow. Wyn Quaker RN, MSN, CDE Diabetes Coordinator Inpatient Diabetes Program [phone]

## 2012-08-10 NOTE — Progress Notes (Signed)
Resp Care Note;Pt on home CPAP unit with 2lpm oxygen bled in.

## 2012-08-10 NOTE — Progress Notes (Signed)
CARDIAC REHAB PHASE I   PRE:  Rate/Rhythm: 70SR  BP:  Supine:   Sitting: 163/67  Standing:    SaO2: 93-94%RA on ear   Pt was on 4L  MODE:  Ambulation: 200 ft   POST:  Rate/Rhythm: 94  BP:  Supine:   Sitting: 161/50  Standing:    SaO2: 87-94%RA ear,  91%rest 1105-1145 It is very difficult to get accurate reading of RA sats when pt walking because his fingers register so much lower than his ears. His hands registered 86% while his ear registers 93%. Difficult to hold to ear and get accurate reading walking but we did have 93%RA part of walk but when back to room it was 87% RA on ear. Pt states it is hard for him to stand still and is easier to keep moving so hard to get reading whole walk. SATS to 91%RA with rest in room so we decreased oxygen from 4 to 2 L and notified pt's RN to try his ear. Pt walked 200 ft with rolling walker and asst x 2 with gait belt use. Gets walker out too far at times and encouraged to slow down. To recliner after walk. Call bell in reach. Denied Cp.   Graylon Good, RN BSN  08/10/2012 11:40 AM

## 2012-08-10 NOTE — Progress Notes (Signed)
Chaplain Note:  Chaplain visited with pt who was resting in bed, awake, alert, and oriented.  Pt had recently received news of the death of his son-in-law.  Chaplain provided spiritual comfort, support, and prayer for pt.  Chaplain provided a listening presence so the the pt could express his feelings of grief.  Pt expressed appreciation for chaplain support.  Chaplain will follow up as needed.  08/10/12 2000  Clinical Encounter Type  Visited With Patient  Visit Type Spiritual support  Referral From Nurse  Spiritual Encounters  Spiritual Needs Prayer;Emotional;Grief support  Stress Factors  Patient Stress Factors Health changes;Loss;Major life changes  Family Stress Factors Loss  Jearld Lesch, Chaplain [phone]

## 2012-08-11 DIAGNOSIS — I25810 Atherosclerosis of coronary artery bypass graft(s) without angina pectoris: Secondary | ICD-10-CM

## 2012-08-11 DIAGNOSIS — I509 Heart failure, unspecified: Secondary | ICD-10-CM

## 2012-08-11 DIAGNOSIS — I5042 Chronic combined systolic (congestive) and diastolic (congestive) heart failure: Secondary | ICD-10-CM | POA: Insufficient documentation

## 2012-08-11 DIAGNOSIS — I2119 ST elevation (STEMI) myocardial infarction involving other coronary artery of inferior wall: Secondary | ICD-10-CM

## 2012-08-11 DIAGNOSIS — I5033 Acute on chronic diastolic (congestive) heart failure: Secondary | ICD-10-CM

## 2012-08-11 LAB — BASIC METABOLIC PANEL
CO2: 30 mEq/L (ref 19–32)
Chloride: 93 mEq/L — ABNORMAL LOW (ref 96–112)
Creatinine, Ser: 1.23 mg/dL (ref 0.50–1.35)
GFR calc Af Amer: 64 mL/min — ABNORMAL LOW (ref >90)
Potassium: 4.3 mEq/L (ref 3.5–5.1)
Sodium: 131 mEq/L — ABNORMAL LOW (ref 135–145)

## 2012-08-11 LAB — CBC
Platelets: 187 10*3/uL (ref 150–400)
RBC: 4.85 MIL/uL (ref 4.22–5.81)
RDW: 14.6 % (ref 11.5–15.5)
WBC: 11.9 10*3/uL — ABNORMAL HIGH (ref 4.0–10.5)

## 2012-08-11 MED ORDER — INSULIN GLARGINE 100 UNIT/ML ~~LOC~~ SOLN
55.0000 [IU] | Freq: Two times a day (BID) | SUBCUTANEOUS | Status: DC
  Administered 2012-08-11 – 2012-08-12 (×2): 55 [IU] via SUBCUTANEOUS
  Filled 2012-08-11 (×3): qty 0.55

## 2012-08-11 MED ORDER — FUROSEMIDE 10 MG/ML IJ SOLN
40.0000 mg | Freq: Two times a day (BID) | INTRAMUSCULAR | Status: DC
  Administered 2012-08-11 – 2012-08-12 (×3): 40 mg via INTRAVENOUS
  Filled 2012-08-11 (×3): qty 4

## 2012-08-11 MED ORDER — INSULIN ASPART 100 UNIT/ML ~~LOC~~ SOLN
6.0000 [IU] | Freq: Three times a day (TID) | SUBCUTANEOUS | Status: DC
  Administered 2012-08-11 – 2012-08-12 (×5): 6 [IU] via SUBCUTANEOUS

## 2012-08-11 MED ORDER — INSULIN ASPART 100 UNIT/ML ~~LOC~~ SOLN
0.0000 [IU] | Freq: Three times a day (TID) | SUBCUTANEOUS | Status: DC
  Administered 2012-08-11 (×3): 3 [IU] via SUBCUTANEOUS
  Administered 2012-08-12: 2 [IU] via SUBCUTANEOUS
  Administered 2012-08-12: 3 [IU] via SUBCUTANEOUS

## 2012-08-11 MED ORDER — FUROSEMIDE 10 MG/ML IJ SOLN: 40.0000 mg | Freq: Once | INTRAMUSCULAR | Status: DC

## 2012-08-11 NOTE — Progress Notes (Signed)
Patient evaluated for long-term disease management services with [facility] as a benefit of his [employer]. Spoke with Mr Asbury at bedside and consents were obtained. He reports he lives with his wife and plans to return home. Will likely need home health services at discharge. Patient will receive a post discharge transition of care call and monthly home visits for assessments and for education. Explained how [facility] will not replace or interfere with home health. Left contact information along with [facility] Care Management packet at bedside. Will make inpatient RNCM aware [place] [facility] will follow.  Vick Frees, [facility], [phone]

## 2012-08-11 NOTE — Progress Notes (Signed)
CARDIAC REHAB PHASE I   PRE:  Rate/Rhythm: 73 SR    BP: sitting 149/81    SaO2: 85 RA in bed  MODE:  Ambulation: 150 ft   POST:  Rate/Rhythm: 80 SR    BP: sitting 166/99     SaO2: 93-96 2L  SATURATION QUALIFICATIONS: (This note is used to comply with regulatory documentation for home oxygen)  Patient Saturations on Room Air at Rest = 86%  Patient Saturations on Room Air while Ambulating = 84%  Patient Saturations on 2 Liters of oxygen while Ambulating = 95%  Please briefly explain why patient needs home oxygen: Pt off O2 in bed ([place] in nose but not connected to wall). SaO2 low in bed, on edge of bed and walking without O2. SaO2 up to 90s with 2L O2. Pt c/o weak legs. Used 2L, RW and assist x2. Will continue to follow. Denied SOB. [place], [place], ACSM 08/11/2012 9:44 AM

## 2012-08-11 NOTE — Progress Notes (Addendum)
SUBJECTIVE: Some SOB. No chest pain. Nausea resolved.   BP 140/58  Pulse 78  Temp(Src) 98.4 F (36.9 C) (Oral)  Resp 18  Ht 5\' 5"  (1.651 m)  Wt 296 lb 9.6 oz (134.537 kg)  BMI 49.36 kg/m2  SpO2 95%  Intake/Output Summary (Last 24 hours) at 08/11/12 0715 Last data filed at 08/10/12 1300  Gross per 24 hour  Intake    360 ml  Output      0 ml  Net    360 ml    PHYSICAL EXAM General: Well developed, well nourished, in no acute distress. Alert and oriented x 3.  Psych:  Good affect, responds appropriately Neck: Cannot assess JVD. No masses noted.  Lungs: Decreased BS bases bilaterally with faint crackles. No wheezes or rhonci noted.  Heart: RRR with no murmurs noted. Abdomen: Bowel sounds are present. Soft, non-tender.  Extremities: Trace bilateral lower extremity edema.   LABS: Basic Metabolic Panel:  Recent Labs  08/10/12 0550 08/11/12 0550  NA 131* 131*  K 5.3* 4.3  CL 95* 93*  CO2 29 30  GLUCOSE 284* 180*  BUN 31* 38*  CREATININE 1.17 1.23  CALCIUM 9.1 8.8   CBC:  Recent Labs  08/09/12 0047  08/10/12 0550 08/11/12 0550  WBC 7.4  < > 10.7* 11.9*  NEUTROABS 4.7  --   --   --   HGB 13.7  < > 14.3 13.8  HCT 41.3  < > 43.1 41.6  MCV 85.9  < > 87.8 85.8  PLT 176  < > 184 187  < > = values in this interval not displayed. Cardiac Enzymes:  Recent Labs  08/09/12 0045 08/09/12 1022 08/09/12 1558  TROPONINI 0.48* 1.52* 1.72*   Fasting Lipid Panel:  Recent Labs  08/09/12 1022  CHOL 199  HDL 38*  LDLCALC 110*  TRIG 255*  CHOLHDL 5.2    Current Meds: . aspirin EC  81 mg Oral Daily  . clopidogrel  75 mg Oral Q breakfast  . diltiazem  240 mg Oral QPM  . DULoxetine  60 mg Oral Daily  . fenofibrate  54 mg Oral Daily  . glipiZIDE  10 mg Oral BID AC  . hydrochlorothiazide  25 mg Oral Daily  . insulin aspart  0-20 Units Subcutaneous TID WC  . insulin aspart  0-5 Units Subcutaneous QHS  . insulin glargine  100 Units Subcutaneous Daily  .  isosorbide mononitrate  30 mg Oral Daily  . lisinopril  20 mg Oral Daily  . metoprolol tartrate  25 mg Oral BID  . polyethylene glycol  17 g Oral Daily  . rosuvastatin  20 mg Oral q1800     ASSESSMENT AND PLAN:  1. Inferior STEMI: Pt admitted 08/08/12 with acute inferior STEMI. Found to have hazy, thrombotic stenosis of the stented segment in the mid body of the SVG to the PDA. Now s/p DES x 1 mid body of SVG to PDA. Nausea and vomiting resolved. Will continue ASA and Plavix. PRU 174 so Plavix is effective. Continue statin and beta blocker.   2. HTN: BP better controlled.   3. DM: Appreciate Diabetes management team recs. HgbA1C this am. Will adjust therapy as recommended.   4. Hypoxemia: I suspect that he has some degree of lung disease with past tobacco abuse and his body habitus suggests OSA/obesity hypoventilation syndrome. He also has diastolic dysfunction. He appears to be volume overloaded. CXR suggestive of mild volume overload. Will diurese today with Lasix  40 mg IV BID. Will ask Case Management to help arrange home O2 as his O2 sats drop to 86% with ambulation.   5. Dispo: His son in law died yesterday. Hopeful discharge on Friday if stable so he can be a part of the family arrangements.  He is followed in the [place] office by Dr. [name].     [name]  5/1/20147:15 AM

## 2012-08-12 ENCOUNTER — Telehealth: Payer: Self-pay | Admitting: Cardiology

## 2012-08-12 ENCOUNTER — Encounter (HOSPITAL_COMMUNITY): Payer: Self-pay | Admitting: Nurse Practitioner

## 2012-08-12 DIAGNOSIS — R197 Diarrhea, unspecified: Secondary | ICD-10-CM

## 2012-08-12 DIAGNOSIS — K529 Noninfective gastroenteritis and colitis, unspecified: Secondary | ICD-10-CM

## 2012-08-12 DIAGNOSIS — R5381 Other malaise: Secondary | ICD-10-CM

## 2012-08-12 LAB — CBC
HCT: 42.8 % (ref 39.0–52.0)
MCH: 28.3 pg (ref 26.0–34.0)
MCV: 85.4 fL (ref 78.0–100.0)
Platelets: 181 10*3/uL (ref 150–400)
RDW: 14.3 % (ref 11.5–15.5)

## 2012-08-12 LAB — GLUCOSE, CAPILLARY: Glucose-Capillary: 192 mg/dL — ABNORMAL HIGH (ref 70–99)

## 2012-08-12 LAB — BASIC METABOLIC PANEL
BUN: 36 mg/dL — ABNORMAL HIGH (ref 6–23)
CO2: 34 mEq/L — ABNORMAL HIGH (ref 19–32)
Chloride: 93 mEq/L — ABNORMAL LOW (ref 96–112)
Creatinine, Ser: 1.12 mg/dL (ref 0.50–1.35)
Potassium: 3.5 mEq/L (ref 3.5–5.1)

## 2012-08-12 MED ORDER — METOPROLOL TARTRATE 25 MG PO TABS: 25.0000 mg | ORAL_TABLET | Freq: Two times a day (BID) | ORAL | Status: DC

## 2012-08-12 MED ORDER — FUROSEMIDE 40 MG PO TABS: 40.0000 mg | ORAL_TABLET | Freq: Every day | ORAL | Status: DC

## 2012-08-12 MED ORDER — ASPIRIN 81 MG PO TBEC: 81.0000 mg | DELAYED_RELEASE_TABLET | Freq: Every day | ORAL | Status: DC

## 2012-08-12 MED ORDER — POTASSIUM CHLORIDE CRYS ER 10 MEQ PO TBCR
10.0000 meq | EXTENDED_RELEASE_TABLET | Freq: Every day | ORAL | Status: DC
  Administered 2012-08-12: 10 meq via ORAL
  Filled 2012-08-12: qty 1

## 2012-08-12 MED ORDER — POTASSIUM CHLORIDE CRYS ER 10 MEQ PO TBCR: 10.0000 meq | EXTENDED_RELEASE_TABLET | Freq: Every day | ORAL | Status: DC

## 2012-08-12 MED ORDER — FUROSEMIDE 40 MG PO TABS
40.0000 mg | ORAL_TABLET | Freq: Every day | ORAL | Status: DC
  Filled 2012-08-12: qty 1

## 2012-08-12 MED ORDER — LOPERAMIDE HCL 2 MG PO CAPS: 2.0000 mg | ORAL_CAPSULE | ORAL | Status: DC | PRN

## 2012-08-12 MED ORDER — ROSUVASTATIN CALCIUM 20 MG PO TABS: 20.0000 mg | ORAL_TABLET | Freq: Every day | ORAL | Status: DC

## 2012-08-12 MED ORDER — LOPERAMIDE HCL 2 MG PO CAPS
2.0000 mg | ORAL_CAPSULE | ORAL | Status: DC | PRN
  Administered 2012-08-12 (×2): 2 mg via ORAL
  Filled 2012-08-12: qty 1
  Filled 2012-08-12: qty 2

## 2012-08-12 NOTE — Progress Notes (Signed)
Discussed with pt stent, low sodium, daily wts, and NTG. Voiced understanding and participated in teach back. Pt and family concerned about diarrhea. Notified RN. To walk daily with RW and O2. Left HF booklet and DM diet. [place], ACSM 2:16 PM 08/12/2012

## 2012-08-12 NOTE — Telephone Encounter (Signed)
PER C BERG  - TRANS CARE CALL 5/5 AM -SRS

## 2012-08-12 NOTE — Care Management (Signed)
RN CM spoke to family and wife via phone. They are agreeable to taking pt home, however has concerns with diarrhea. CM did relay this to [place] and she was going to touch base with MD. Family wanted to see if a different medication could be given for diarrhea.  [place] services and DME is set up for pt at this time with [facility]. Bethena Roys, RN, BSN [phone].

## 2012-08-12 NOTE — Progress Notes (Addendum)
SUBJECTIVE: Some SOB. No chest pain. Nausea resolved. He is still having diarrhea.  He is very weak.  Was not able to sit up by himself.    BP 144/76  Pulse 79  Temp(Src) 98.4 F (36.9 C) (Oral)  Resp 18  Ht 5\' 5"  (1.651 m)  Wt 296 lb 9.6 oz (134.537 kg)  BMI 49.36 kg/m2  SpO2 93%  Intake/Output Summary (Last 24 hours) at 08/12/12 0831 Last data filed at 08/12/12 [id number]  Gross per 24 hour  Intake    600 ml  Output   2250 ml  Net  -1650 ml    PHYSICAL EXAM General: obese male,  in no acute distress. Alert and oriented x 3.  Psych:   Neck: Cannot assess JVD. Thick neck.  No masses noted.  Lungs: Decreased BS bases bilaterally with faint crackles. No wheezes or rhonci noted.  Heart: RRR with no murmurs noted. Abdomen: Bowel sounds are present. Soft, non-tender.  Extremities: 1+  bilateral lower extremity edema.   LABS: Basic Metabolic Panel:  Recent Labs  08/10/12 0550 08/11/12 0550  NA 131* 131*  K 5.3* 4.3  CL 95* 93*  CO2 29 30  GLUCOSE 284* 180*  BUN 31* 38*  CREATININE 1.17 1.23  CALCIUM 9.1 8.8   CBC:  Recent Labs  08/11/12 0550 08/12/12 0625  WBC 11.9* 9.2  HGB 13.8 14.2  HCT 41.6 42.8  MCV 85.8 85.4  PLT 187 181   Cardiac Enzymes:  Recent Labs  08/09/12 1022 08/09/12 1558  TROPONINI 1.52* 1.72*   Fasting Lipid Panel:  Recent Labs  08/09/12 1022  CHOL 199  HDL 38*  LDLCALC 110*  TRIG 255*  CHOLHDL 5.2    Current Meds: . aspirin EC  81 mg Oral Daily  . clopidogrel  75 mg Oral Q breakfast  . diltiazem  240 mg Oral QPM  . DULoxetine  60 mg Oral Daily  . fenofibrate  54 mg Oral Daily  . furosemide  40 mg Intravenous Q12H  . glipiZIDE  10 mg Oral BID AC  . hydrochlorothiazide  25 mg Oral Daily  . insulin aspart  0-15 Units Subcutaneous TID WC  . insulin aspart  0-5 Units Subcutaneous QHS  . insulin aspart  6 Units Subcutaneous TID WC  . insulin glargine  55 Units Subcutaneous BID  . isosorbide mononitrate  30 mg Oral Daily   . lisinopril  20 mg Oral Daily  . metoprolol tartrate  25 mg Oral BID  . polyethylene glycol  17 g Oral Daily  . rosuvastatin  20 mg Oral q1800     ASSESSMENT AND PLAN:  1. Inferior STEMI: Pt admitted 08/08/12 with acute inferior STEMI. Found to have hazy, thrombotic stenosis of the stented segment in the mid body of the SVG to the PDA. Now s/p DES x 1 mid body of SVG to PDA. Nausea and vomiting resolved.   Still having diarrhea.  Will continue ASA and Plavix. PRU 174 so Plavix is effective. Continue statin and beta blocker.   From a cardiology standpoint, he is doing well and is ready for DC.  No CP -  he's chronically short of breath  He has had significant debility before coming to the hospital ( ie, uses a lift chair at home to get up) and now is even weaker since being in the hospital for 4 days.    His sister admitted that he really did not take good care of himself - still eats  a high salt diet, does not care for his diabetes as he should.  I informed then that he would continue to have problems unless he took better care of himself.    2. HTN: BP better controlled.   3. DM: Appreciate Diabetes management team recs. HgbA1C this am. Will adjust therapy as recommended.   4. Hypoxemia: I suspect that he has some degree of lung disease with past tobacco abuse and his body habitus suggests OSA/obesity hypoventilation syndrome. He also has diastolic dysfunction. He appears to be volume overloaded. CXR suggestive of mild volume overload. Will diurese today with Lasix 40 mg IV BID. Will ask Case Management to help arrange home O2 as his O2 sats drop to 86% with ambulation.  His O2 sats are 95% with O2.  5. Diarrhea:  He may have a GI virus.  Will try imodium  5. Dispo: His son in law died recently.  We were hoping that he would be able to go home today but he just is not strong enough to manage. He was not able to even sit up in bed (took 2 of [place] to pull him up).  Ambulated with cardiac rehab  - did OK but will need home O2.  Case manager consult.  I think he will need to go to a SNF for a week or so.   Patients wife does not necessarily want him to go to SNF.  Will have the case manager discuss with her.  I have ordered PT and OT consult.      [name].  5/2/20148:31 AM

## 2012-08-12 NOTE — Evaluation (Signed)
Occupational Therapy Evaluation Patient Details Name: [name] MRN: [id number] DOB: 07-19-1933 Today's Date: 08/12/2012 Time: [id number] OT Time Calculation (min): 27 min  OT Assessment / Plan / Recommendation Clinical Impression  79 yo male admitted with n/v and diarrhea. unrelenting chest pain and determined to have STEMI.  Underwent CATH for stenting. Ot to follow acutely . Recommend HHOt for d/c planning    OT Assessment  Patient needs continued OT Services    Follow Up Recommendations  Home health OT    Barriers to Discharge      Equipment Recommendations  3 in 1 bedside comode;Other (comment) (RW oxygen CNA)    Recommendations for Other Services    Frequency  Min 2X/week    Precautions / Restrictions Precautions Precautions: Fall;Other (comment) (oxygen) Precaution Comments: 2L oxygen needed Restrictions Weight Bearing Restrictions: No   Pertinent Vitals/Pain Oxygen 2 L    ADL  Grooming: Shaving;+1 Total assistance (from granddaughter on arrival) Where Assessed - Grooming: Supported sitting Upper Body Dressing: Minimal assistance Where Assessed - Upper Body Dressing: Unsupported sitting (don PJ top) Lower Body Dressing: Moderate assistance Where Assessed - Lower Body Dressing: Unsupported sit to stand (able to pull underwear and pants up starting at knees) Toilet Transfer: Supervision/safety Toilet Transfer Method: Sit to Loss adjuster, chartered: Raised toilet seat with arms (or 3-in-1 over toilet) Toileting - Clothing Manipulation and Hygiene: Supervision/safety Where Assessed - Toileting Clothing Manipulation and Hygiene: Sit to stand from 3-in-1 or toilet ADL Comments: pt's family present and very anxious about patient d/cing at this time. Pt remains with diarrhea and family doesnt understand what has or hasnt been tested. Pts family requesting 3n1 and making sure home services will be provided. wife not present due to funeral arrangement and being  with daughter due to son in law pasing away. Pt don L/ XL briefs and were not appropriate fit. Family informed. Pt fully dressed and will need total (A) to don socks shoes and to start LB dressing. Family anxious with level of care needed and wife's ability to assist. Pt is ready to d/c    OT Diagnosis: Generalized weakness  OT Problem List: Decreased strength;Impaired balance (sitting and/or standing);Decreased activity tolerance;Decreased safety awareness;Decreased knowledge of use of DME or AE;Decreased knowledge of precautions;Obesity OT Treatment Interventions: Self-care/ADL training;Therapeutic exercise;Energy conservation;DME and/or AE instruction;Therapeutic activities;Balance training;Patient/family education   OT Goals Acute Rehab OT Goals OT Goal Formulation: With patient/family Time For Goal Achievement: 08/26/12 Potential to Achieve Goals: Good ADL Goals Pt Will Perform Lower Body Bathing: with modified independence;Sit to stand from chair;with adaptive equipment ADL Goal: Lower Body Bathing - Progress: Goal set today Pt Will Perform Lower Body Dressing: with modified independence;Sit to stand from chair;with adaptive equipment ADL Goal: Lower Body Dressing - Progress: Goal set today Pt Will Transfer to Toilet: with modified independence;Ambulation;3-in-1 ADL Goal: Toilet Transfer - Progress: Goal set today Pt Will Perform Toileting - Clothing Manipulation: with modified independence;Sitting on 3-in-1 or toilet ADL Goal: Toileting - Clothing Manipulation - Progress: Goal set today Pt Will Perform Toileting - Hygiene: with modified independence;Sit to stand from 3-in-1/toilet ADL Goal: Toileting - Hygiene - Progress: Goal set today Miscellaneous OT Goals Miscellaneous OT Goal #1: Pt will complete bed mobility MOD I as precursor to adls OT Goal: Miscellaneous Goal #1 - Progress: Goal set today  Visit Information  Last OT Received On: 08/12/12 Assistance Needed: +1     Subjective Data  Subjective: "tell them the red bird is ready to go"  Patient Stated Goal: to return home today (son in law passed and he wants to be with his daughter)   Prior Functioning     Home Living Lives With: Spouse Available Help at Discharge: Family Type of Home: House Home Access: Stairs to [employer] entry Technical brewer of Steps: 3 Entrance Stairs-Rails: Right;Left Home Layout: One level Bathroom Shower/Tub: Chiropodist: Standard Home Adaptive Equipment: Bedside commode/3-in-1;Straight cane;Walker - rolling Prior Function Level of Independence: Independent with assistive device(s) Able to Take Stairs?: Yes Driving: Yes Communication Communication: No difficulties Dominant Hand: Right         Vision/Perception Vision - History Baseline Vision: No visual deficits Patient Visual Report: No change from baseline   Cognition  Cognition Arousal/Alertness: Awake/alert Behavior During Therapy: WFL for tasks assessed/performed Overall Cognitive Status: Within Functional Limits for tasks assessed    Extremity/Trunk Assessment Right Upper Extremity Assessment RUE ROM/Strength/Tone: Within functional levels (noted edeam) RUE Sensation: WFL - Light Touch RUE Coordination: WFL - gross/fine motor Left Upper Extremity Assessment LUE ROM/Strength/Tone: Within functional levels (noted edema) LUE Sensation: WFL - Light Touch LUE Coordination: WFL - gross/fine motor Right Lower Extremity Assessment RLE ROM/Strength/Tone: WFL for tasks assessed;Deficits RLE ROM/Strength/Tone Deficits: hip flexor weakness Bil, more so on the R due to cath site.  General weakness bil Left Lower Extremity Assessment LLE ROM/Strength/Tone: WFL for tasks assessed;Deficits LLE ROM/Strength/Tone Deficits: se R LE,  Trunk Assessment Trunk Assessment: Normal     Mobility Bed Mobility Bed Mobility: Not assessed Supine to Sit: 4: Min assist;Other (comment)  (therapist used by pt like a rail) Sitting - Scoot to Edge of Bed: 5: Supervision Details for Bed Mobility Assistance: Will need minor assist to get OOB in the short term Transfers Sit to Stand: 5: Supervision;With upper extremity assist;From chair/3-in-1 Stand to Sit: 5: Supervision;With upper extremity assist;To chair/3-in-1 Details for Transfer Assistance: slow and correct hand placement     Exercise     Balance Balance Balance Assessed: No   End of Session OT - End of Session Activity Tolerance: Patient tolerated treatment well Patient left: in chair;with call bell/phone within reach;with family/visitor present Nurse Communication: Mobility status;Precautions  GO     Veneda Melter 08/12/2012, 3:43 PM Pager: [phone]

## 2012-08-12 NOTE — Evaluation (Signed)
Physical Therapy Evaluation Patient Details Name: [name] MRN: [id number] DOB: March 21, 1934 Today's Date: 08/12/2012 Time: [id number] PT Time Calculation (min): 32 min  PT Assessment / Plan / Recommendation Clinical Impression  pt adm with unrelenting chest pain and determined to have STEMI.  Underwent CATH for stenting.  On eval pt is weak with little activity tolerance.  Recommend HHPT to address functional issues.    PT Assessment  All further PT needs can be met in the next venue of care    Follow Up Recommendations  Home health PT    Does the patient have the potential to tolerate intense rehabilitation      Barriers to Discharge        Equipment Recommendations       Recommendations for Other Services     Frequency      Precautions / Restrictions Restrictions Weight Bearing Restrictions: No   Pertinent Vitals/Pain       Mobility  Bed Mobility Bed Mobility: Supine to Sit;Sitting - Scoot to Edge of Bed Supine to Sit: 4: Min assist;Other (comment) (therapist used by pt like a rail) Sitting - Scoot to Edge of Bed: 5: Supervision Details for Bed Mobility Assistance: Will need minor assist to get OOB in the short term Transfers Transfers: Sit to Stand;Stand to Sit Sit to Stand: 6: Modified independent (Device/Increase time);With upper extremity assist;From bed Stand to Sit: 6: Modified independent (Device/Increase time);With upper extremity assist;To chair/3-in-1 Details for Transfer Assistance: safe but slow Ambulation/Gait Ambulation/Gait Assistance: 5: Supervision Ambulation Distance (Feet): 170 Feet Assistive device: Rolling walker Ambulation/Gait Assistance Details: generally steady with signs of fatigue as he progressed Gait Pattern: Step-through pattern    Exercises     PT Diagnosis:    PT Problem List: Decreased strength;Decreased activity tolerance;Decreased mobility;Cardiopulmonary status limiting activity PT Treatment Interventions:     PT  Goals    Visit Information  Last PT Received On: 08/12/12 Assistance Needed: +1    Subjective Data  Subjective: I think at this point I'm going to have to use the RW Patient Stated Goal: Be able to do for myself   Prior Functioning  Home Living Lives With: Spouse Available Help at Discharge: Family Type of Home: House Home Access: Stairs to enter;Level entry Technical brewer of Steps: 3 Entrance Stairs-Rails: Right;Left Home Layout: One level Bathroom Shower/Tub: Chiropodist: Standard Home Adaptive Equipment: Bedside commode/3-in-1;Straight cane;Walker - rolling Prior Function Level of Independence: Independent with assistive device(s) Able to Take Stairs?: Yes Driving: Yes Communication Communication: No difficulties    Cognition  Cognition Arousal/Alertness: Awake/alert Behavior During Therapy: WFL for tasks assessed/performed Overall Cognitive Status: Within Functional Limits for tasks assessed    Extremity/Trunk Assessment Right Lower Extremity Assessment RLE ROM/Strength/Tone: [facility] for tasks assessed;Deficits RLE ROM/Strength/Tone Deficits: hip flexor weakness Bil, more so on the R due to cath site.  General weakness bil Left Lower Extremity Assessment LLE ROM/Strength/Tone: WFL for tasks assessed;Deficits LLE ROM/Strength/Tone Deficits: se R LE,    Balance Balance Balance Assessed: No  End of Session PT - End of Session Activity Tolerance: Patient tolerated treatment well Patient left: in chair;with call bell/phone within reach;with family/visitor present Nurse Communication: Mobility status  GP     Citlally Captain, Tessie Fass 08/12/2012, 12:27 PM 08/12/2012  Donnella Sham, PT [phone]  (pager)

## 2012-08-12 NOTE — Discharge Summary (Signed)
Patient ID: [name],  MRN: [id number], DOB/AGE: [birth year] 77 y.o.  Admit date: 08/08/2012 Discharge date: 08/12/2012  Primary Care Provider: [name] Primary Cardiologist: Myles Gip MD  Discharge Diagnoses Principal Problem:   ST elevation myocardial infarction (STEMI) of inferior wall  **s/p PCI and DES to the VG->distal RCA/PL this admission Active Problems:   CAD (coronary artery disease) of artery bypass graft   Essential hypertension, benign   Morbid obesity   Hyperlipidemia   Type II or unspecified type diabetes mellitus without mention of complication, uncontrolled   Gastroenteritis   Diarrhea   Physical deconditioning   Sleep apnea   Allergies Allergies  Allergen Reactions  . Zocor (Simvastatin) Other (See Comments)    fatigue  . Lipitor (Atorvastatin)     Procedures  Cardiac Catheterization and Percutaneous Coronary Intervention 4.28.2014  Hemodynamic Findings: Central aortic pressure: 149/63 Left ventricular pressure: 169/9/21  Angiographic Findings:  Left mainstem: Long mid and distal calcified 25% stenosis.  Left anterior descending (LAD): Diffuse proximal vessel disease. 100% occlusion in the mid vessel. Heavy calcification. The distal vessel fills via the LIMA and appears to be free of high-grade disease. There is a diagonal that is occluded and no longer fills via the vein graft.  Left circumflex (LCx): There is a ramus intermediate that is moderate-sized. It is branching. There is moderate calcification. There is diffuse 60-70% stenosis in a somewhat narrow caliber vessel which is unchanged from last cath.  The proximal AV groove has a stent with mild luminal irregularities but is otherwise widely patent. There is a step off in the caliber of the vessel immediately after the stent with long 50% stenosis leading into a large obtuse marginal. The obtuse marginal is free of high-grade disease.  Right coronary artery (RCA): Occluded at the  proximal segment. The distal vessel fills via the sequential vein graft.   Graft Anatomy:  LIMA to LAD: Widely patent   SVG to Diag:  Known to be occluded (as demonstrated in the previous catheterization)   SVG to distal RCA/PL: The proximal stents are widely patent. The mid stented segment in the body of SVG has a 99% hazy stenosis    Left ventriculography: Left ventricular systolic function is normal with LVEF 50-55%, inferoapical hypokinesis.   Impression: 1. Acute inferior STEMI secondary to thrombotic lesion in mid body of SVG to PDA, now s/p successful PTCA/DES x 1 mid body of SVG to PDA 2. Preserved LV systolic function 3. Triple vessel CAD s/p 5V CABG with 3/5 patent bypass grafts _____________  History of Present Illness  79 y/o male with h/o CAD s/p prior CABG.  He was in his USOH until the day of admission when he had sudden onset of chest pain unrelieved with sublingual nitroglycerin.  He presented to the ED at [facility] and was found to have acute inferior ST elevation.  Code STEMI was called and he was taken emergently to [facility] for diagnostic catheterization.  Hospital Course  Following arrival to [facility], pt underwent emergent diagnostic cardiac catheterization revealing a 99% hazy stenosis within the vein graft to the distal RCA/posterolateral branch.  He otherwise had native multivessel dzs and a patent LIMA->LAD.  The VG->dRCA/PL was successfully stented using a drug eluting stent.  The pt tolerated the procedure well and was monitored in the coronary intensive care unit afterward.  He ruled in for MI, eventually peaking his troponin at 1.72.  He was maintained on asa, plavix, bb, and statin therapy (crestor  chosen in the setting of previous intolerances to lipitor and zocor).  On 4/29, pt began to experience nausea, vomiting, and diarrhea.  Nausea and vomiting resolved within 24 hrs, though diarrhea has persisted.  Imodium was added prn, with some reduction  in diarrhea.  He has remained afebrile and WBC's have been stable.  It is felt that this likely represents viral gastroenteritis.  His volume has been stable.  Pt has worked with cardiac rehab and was noted to be both deconditioned and hypoxic with ambulation.  As a result of room air saturations of 86% @ rest (84% with ambulation).  Following his MI, he was felt to have mild volume overload and he was diuresed with a net negative of 4.45 L for this admission.  Home O2 has been prescribed.  He was previously using this at night only.  He was also evaluated by physical and occupational therapy who have recommended PT/OT along with [place].  These have been arranged and patient will be discharged today in fair condition.  Discharge Vitals Blood pressure 144/68, pulse 78, temperature 98.2 F (36.8 C), temperature source Oral, resp. rate 20, height 5\' 5"  (1.651 m), weight 296 lb 9.6 oz (134.537 kg), SpO2 94.00%.  Filed Weights   08/08/12 2100 08/11/12 0601  Weight: 293 lb 6.9 oz (133.1 kg) 296 lb 9.6 oz (134.537 kg)   Labs  CBC  Recent Labs  08/11/12 0550 08/12/12 0625  WBC 11.9* 9.2  HGB 13.8 14.2  HCT 41.6 42.8  MCV 85.8 85.4  PLT 187 0000000   Basic Metabolic Panel  Recent Labs  08/11/12 0550 08/12/12 1018  NA 131* 135  K 4.3 3.5  CL 93* 93*  CO2 30 34*  GLUCOSE 180* 202*  BUN 38* 36*  CREATININE 1.23 1.12  CALCIUM 8.8 8.8   Liver Function Tests Lab Results  Component Value Date   ALT 34 08/09/2012   AST 27 08/09/2012   ALKPHOS 51 08/09/2012   BILITOT 0.3 08/09/2012   Cardiac Enzymes Lab Results  Component Value Date   TROPONINI 1.72* 08/09/2012   Hemoglobin A1C  Recent Labs  08/10/12 0818  HGBA1C 12.1*   Disposition  Pt is being discharged home today in good condition.  Follow-up Plans & Appointments  Follow-up Information   Follow up with [name], MD On 08/16/2012. (1:20 PM)    Contact information:   [address] [place]  [address] [phone]      Discharge Medications    Medication List    STOP taking these medications       polyethylene glycol packet  Commonly known as:  MIRALAX / GLYCOLAX      TAKE these medications       alprazolam 2 MG tablet  Commonly known as:  XANAX  Take 2 mg by mouth at bedtime as needed.     aspirin 81 MG EC tablet  Take 1 tablet (81 mg total) by mouth daily.     clopidogrel 75 MG tablet  Commonly known as:  PLAVIX  Take 1 tablet (75 mg total) by mouth daily with breakfast.     diltiazem 240 MG 24 hr capsule  Commonly known as:  DILACOR XR  Take 240 mg by mouth daily.     fenofibrate 145 MG tablet  Commonly known as:  TRICOR  Take 145 mg by mouth daily.     furosemide 40 MG tablet  Commonly known as:  LASIX  Take 1 tablet (40  mg total) by mouth daily.     glipiZIDE 10 MG tablet  Commonly known as:  GLUCOTROL  Take 10 mg by mouth 2 (two) times daily before a meal.     insulin glargine 100 UNIT/ML injection  Commonly known as:  LANTUS  Inject 100 Units into the skin every morning.     isosorbide mononitrate 30 MG 24 hr tablet  Commonly known as:  IMDUR  Take 1 tablet (30 mg total) by mouth daily.     lisinopril-hydrochlorothiazide 20-12.5 MG per tablet  Commonly known as:  PRINZIDE,ZESTORETIC  Take 1 tablet by mouth daily.     loperamide 2 MG capsule  Commonly known as:  IMODIUM  Take 1 capsule (2 mg total) by mouth as needed for diarrhea or loose stools (please give 2 capsules now and then 1 capsule PRN diarrhea).     metoprolol tartrate 25 MG tablet  Commonly known as:  LOPRESSOR  Take 1 tablet (25 mg total) by mouth 2 (two) times daily.     nitroGLYCERIN 0.4 MG/SPRAY spray  Commonly known as:  NITROLINGUAL  Place 1 spray under the tongue every 5 (five) minutes as needed. For chest pain     potassium chloride 10 MEQ tablet  Commonly known as:  K-DUR,KLOR-CON  Take 1 tablet (10 mEq total) by mouth daily.     rosuvastatin 20 MG tablet   Commonly known as:  CRESTOR  Take 1 tablet (20 mg total) by mouth daily at 6 PM.       Outstanding Labs/Studies  none  Duration of Discharge Encounter   Greater than 30 minutes including physician time.  Signed, [name] NP 08/12/2012, 4:46 PM   Attending Note:   The patient was seen and examined.  Agree with assessment and plan as noted above.  Changes made to the above note as needed.  Mr. Uppal is chronically debilitated.  He has improved since his MI and is stable from a cardiac standpoint.  He needs to improved his diet, avoid salt, lose weight and starte exercising.  See my note from day of discharge.    Thayer Headings, Brooke Bonito., MD, [facility] 08/14/2012, 9:06 PM

## 2012-08-15 ENCOUNTER — Encounter: Payer: Self-pay | Admitting: Cardiology

## 2012-08-15 NOTE — Telephone Encounter (Signed)
Patient contacted regarding discharge from [facility] on Friday, May, 2, 2014.  Patient understands to follow up with provider Dr. [name] on Tuesday, May, 6, 2014 at 1:20 pm at [facility]. Patient does understand his discharge instructions. Patient does understand his medications and regimen. Patient does understand to bring all medications to this visit  Patient denies having sob, dizziness or chest pain. Patient states he is doing well.

## 2012-08-16 ENCOUNTER — Encounter: Payer: Self-pay | Admitting: Cardiology

## 2012-08-16 ENCOUNTER — Ambulatory Visit (INDEPENDENT_AMBULATORY_CARE_PROVIDER_SITE_OTHER): Payer: Medicare Other | Admitting: Cardiology

## 2012-08-16 VITALS — BP 132/76 | HR 76 | Ht 65.0 in | Wt 285.0 lb

## 2012-08-16 DIAGNOSIS — Z79899 Other long term (current) drug therapy: Secondary | ICD-10-CM

## 2012-08-16 DIAGNOSIS — I5032 Chronic diastolic (congestive) heart failure: Secondary | ICD-10-CM

## 2012-08-16 DIAGNOSIS — I2119 ST elevation (STEMI) myocardial infarction involving other coronary artery of inferior wall: Secondary | ICD-10-CM

## 2012-08-16 DIAGNOSIS — I10 Essential (primary) hypertension: Secondary | ICD-10-CM

## 2012-08-16 DIAGNOSIS — E785 Hyperlipidemia, unspecified: Secondary | ICD-10-CM

## 2012-08-16 DIAGNOSIS — G4730 Sleep apnea, unspecified: Secondary | ICD-10-CM

## 2012-08-16 NOTE — Patient Instructions (Addendum)
Your physician recommends that you schedule a follow-up appointment in: 3 months. Your physician recommends that you continue on your current medications as directed. Please refer to the Current Medication list given to you today. Your physician recommends that you return for a FASTING lipid/liver & BMET profiles done around November 16, 2012 at [facility] just before your next visit. You have been given the lab orders today.

## 2012-08-16 NOTE — Assessment & Plan Note (Signed)
Crestor 20 mg added at recent hospital visit. Followup FLP and LFT for next visit.

## 2012-08-16 NOTE — Assessment & Plan Note (Signed)
Continue CPAP.  

## 2012-08-16 NOTE — Assessment & Plan Note (Signed)
Lasix with potassium supplements were added at recent hospital stay. Lab work from 5/2 showed BUN 36 and creatinine 1.1. Will followup BMET for his next visit.

## 2012-08-16 NOTE — Assessment & Plan Note (Signed)
Continue current medical regimen.

## 2012-08-16 NOTE — Assessment & Plan Note (Signed)
Status post recent presentation, now clinically stable following DES to the SVG to RCA/PL. Plan to continue current medical regimen including DAPT. He will begin a basic walking regimen at home, does have home health at this point. Uses oxygen as needed and at nighttime with CPAP. Clinical followup arranged over the next 3 months.

## 2012-08-16 NOTE — Progress Notes (Signed)
Clinical Summary Mr. Coody is a 79 y.o.male presenting for office followup. I last saw him in June 2013. He was recently hospitalized at [facility] in April with an inferior STEMI, ultimately underwent placement of a DES to the SVG to distal RCA/PL. It was noted that he had patency of the LIMA to LAD, prior documented occlusion of the SVG to diagonal, stents in the proximal SVG to distal RCA/PLV were patent with new stenosis within the body of the graft. LVEF 50-55% with inferior apical hypokinesis by ventriculography.  Lab work on 5/2 reveal potassium 3.5, BUN 36, creatinine 1.1. Lipid panel during hospitalization revealed cholesterol 199, triglycerides 255, HDL 38, LDL 110.  He is here with his wife today. He reports gradually gaining strength, is interested in doing a walking regimen at home rather than formal cardiac rehabilitation. Has had some followup with home health. He uses oxygen mainly at night, has an oximeter to check ambulatory saturations.  He reports no chest pain symptoms. I reviewed his medications. He reports no bleeding problems on DAPT. Crestor is a new medicine for him, also Lasix and potassium supplements.   Allergies  Allergen Reactions  . Zocor (Simvastatin) Other (See Comments)    fatigue  . Lipitor (Atorvastatin)     Current Outpatient Prescriptions  Medication Sig Dispense Refill  . alprazolam (XANAX) 2 MG tablet Take 1/3 tablet by mouth every 2 hours as needed through out the night      . aspirin 81 MG EC tablet Take 1 tablet (81 mg total) by mouth daily.      . clopidogrel (PLAVIX) 75 MG tablet Take 1 tablet (75 mg total) by mouth daily with breakfast.  90 tablet  1  . diltiazem (DILACOR XR) 240 MG 24 hr capsule Take 240 mg by mouth daily.      . furosemide (LASIX) 40 MG tablet Take 1 tablet (40 mg total) by mouth daily.  30 tablet  6  . glipiZIDE (GLUCOTROL) 10 MG tablet Take 10 mg by mouth 2 (two) times daily before a meal.      . insulin glargine  (LANTUS) 100 UNIT/ML injection Inject 100 Units into the skin every morning.      . isosorbide mononitrate (IMDUR) 30 MG 24 hr tablet Take 1 tablet (30 mg total) by mouth daily.  30 tablet  3  . lisinopril-hydrochlorothiazide (PRINZIDE,ZESTORETIC) 20-12.5 MG per tablet Take 1 tablet by mouth daily.      . metoprolol tartrate (LOPRESSOR) 25 MG tablet Take 1 tablet (25 mg total) by mouth 2 (two) times daily.  60 tablet  6  . nitroGLYCERIN (NITROLINGUAL) 0.4 MG/SPRAY spray Place 1 spray under the tongue every 5 (five) minutes as needed. For chest pain      . potassium chloride (K-DUR,KLOR-CON) 10 MEQ tablet Take 1 tablet (10 mEq total) by mouth daily.  30 tablet  6  . rosuvastatin (CRESTOR) 20 MG tablet Take 1 tablet (20 mg total) by mouth daily at 6 PM.  30 tablet  6   No current facility-administered medications for this visit.    Past Medical History  Diagnosis Date  . Coronary atherosclerosis of native coronary artery     a. CABG x 4 in 1994. Previous stent placement to SVG to distal RCA;  b. DES to native Cx in 2006;  c. DES SVG to RCA 05/08/11; d. Inf MI/Cath/PCI: LM 25, LAD 100, LCX patent stent, 50 into OM, RI 60-70, RCA 100, LIMA->LAD nl, VG->Diag 100 old,  VG->RCA/PL 99 (DES x 1), EF 50-55%.  . Sleep apnea   . Essential hypertension, benign   . Depression     With anxious features/hx panic attacks  . Morbid obesity   . Hyperlipidemia     Not on statin 2/2 hx of intolerance.   . Nephrolithiasis   . Allergic rhinitis   . Type 2 diabetes mellitus   . ST elevation myocardial infarction (STEMI) of inferior wall     4/14 - DES SVG to PDA    Past Surgical History  Procedure Laterality Date  . Coronary artery bypass graft  1994  . Eye surgery      Social History Mr. Clemon reports that he has never smoked. He has never used smokeless tobacco. Mr. Kalish reports that he does not drink alcohol.  Review of Systems No palpitations or syncope. No orthopnea or PND. Uses CPAP at night  time. Stable appetite. He was negative.  Physical Examination Filed Vitals:   08/16/12 1320  BP: 132/76  Pulse: 76   Filed Weights   08/16/12 1320  Weight: 285 lb (129.275 kg)    Morbidly obese male in no acute distress.  HEENT: Conjunctiva and lids normal, oropharynx clear.  Neck: Supple, no elevated JVP or carotid bruits, no thyromegaly.  Lungs: Clear to auscultation, nonlabored breathing at rest.  Cardiac: Regular rate and rhythm, no S3, no pericardial rub.  Abdomen: Soft, nontender, bowel sounds present, no guarding or rebound.  Extremities: No pitting edema, distal pulses 1-2+.  Skin: Warm and dry.  Musculoskeletal: No kyphosis.  Neuropsychiatric: Alert and oriented x3, affect grossly appropriate.   Problem List and Plan   ST elevation myocardial infarction (STEMI) of inferior wall Status post recent presentation, now clinically stable following DES to the SVG to RCA/PL. Plan to continue current medical regimen including DAPT. He will begin a basic walking regimen at home, does have home health at this point. Uses oxygen as needed and at nighttime with CPAP. Clinical followup arranged over the next 3 months.  Chronic diastolic heart failure Lasix with potassium supplements were added at recent hospital stay. Lab work from 5/2 showed BUN 36 and creatinine 1.1. Will followup BMET for his next visit.  Hyperlipidemia Crestor 20 mg added at recent hospital visit. Followup FLP and LFT for next visit.  Essential hypertension, benign Continue current medical regimen.  Sleep apnea Continue CPAP.    Satira Sark, M.D., F.A.C.C.

## 2012-09-26 ENCOUNTER — Other Ambulatory Visit: Payer: Self-pay | Admitting: *Deleted

## 2012-09-26 MED ORDER — ISOSORBIDE MONONITRATE ER 30 MG PO TB24: 30.0000 mg | ORAL_TABLET | Freq: Every day | ORAL | Status: DC

## 2012-11-07 DIAGNOSIS — R109 Unspecified abdominal pain: Secondary | ICD-10-CM

## 2012-11-21 ENCOUNTER — Ambulatory Visit (INDEPENDENT_AMBULATORY_CARE_PROVIDER_SITE_OTHER): Payer: Medicare Other | Admitting: Cardiology

## 2012-11-21 ENCOUNTER — Ambulatory Visit: Payer: Medicare Other | Admitting: Cardiology

## 2012-11-21 ENCOUNTER — Encounter: Payer: Self-pay | Admitting: Cardiology

## 2012-11-21 VITALS — BP 124/62 | HR 81 | Ht 65.0 in | Wt 277.0 lb

## 2012-11-21 DIAGNOSIS — I2510 Atherosclerotic heart disease of native coronary artery without angina pectoris: Secondary | ICD-10-CM

## 2012-11-21 DIAGNOSIS — I491 Atrial premature depolarization: Secondary | ICD-10-CM

## 2012-11-21 DIAGNOSIS — E785 Hyperlipidemia, unspecified: Secondary | ICD-10-CM

## 2012-11-21 NOTE — Assessment & Plan Note (Signed)
No active angina, status post DES to the SVG to PDA in April 2014. As noted above, would stop Xarelto and continue Plavix and aspirin.

## 2012-11-21 NOTE — Progress Notes (Signed)
Clinical Summary [name] is a 79 y.o.male last seen in May. He is here with his wife. Recent records reviewed. He was placed on Xarelto at [place] recently with concern about atrial fibrillation. I reviewed the ECG from 8/8 which shows sinus rhythm with PACs. From a symptom perspective, he states he has been tired, complaining of left-sided lower back and flank pain. He has been less motivated to walk with PT. Using a walker. No chest pain symptoms.  Recent lab work showed normal LFTs, triglycerides 276, cholesterol 110, HDL 39, LDL 16. Represents substantial reduction in lipids on Crestor started during prior hospitalization.  We discussed this today, decided to give him a period of time off medication to see if any of his pain symptoms could be related. Otherwise we might consider resuming Crestor at very low dose or perhaps a different statin.   Allergies  Allergen Reactions  . Zocor (Simvastatin) Other (See Comments)    fatigue  . Lipitor (Atorvastatin)     Current Outpatient Prescriptions  Medication Sig Dispense Refill  . alprazolam (XANAX) 2 MG tablet Take 1/3 tablet by mouth every 2 hours as needed through out the night      . clopidogrel (PLAVIX) 75 MG tablet Take 1 tablet (75 mg total) by mouth daily with breakfast.  90 tablet  1  . cyclobenzaprine (FLEXERIL) 10 MG tablet Take 10 mg by mouth 3 (three) times daily as needed for muscle spasms.      Marland Kitchen diltiazem (DILACOR XR) 240 MG 24 hr capsule Take 240 mg by mouth daily.      . furosemide (LASIX) 40 MG tablet Take 1 tablet (40 mg total) by mouth daily.  30 tablet  6  . glipiZIDE (GLUCOTROL) 10 MG tablet Take 10 mg by mouth 2 (two) times daily before a meal.      . HYDROcodone-acetaminophen (LORTAB) 10-500 MG per tablet Take 1 tablet by mouth every 6 (six) hours as needed for pain.      Marland Kitchen insulin glargine (LANTUS) 100 UNIT/ML injection Inject 100 Units into the skin every morning.      . isosorbide mononitrate (IMDUR) 30 MG 24  hr tablet Take 1 tablet (30 mg total) by mouth daily.  30 tablet  6  . lisinopril-hydrochlorothiazide (PRINZIDE,ZESTORETIC) 20-12.5 MG per tablet Take 1 tablet by mouth daily.      . metoprolol tartrate (LOPRESSOR) 25 MG tablet Take 1 tablet (25 mg total) by mouth 2 (two) times daily.  60 tablet  6  . nitroGLYCERIN (NITROLINGUAL) 0.4 MG/SPRAY spray Place 1 spray under the tongue every 5 (five) minutes as needed. For chest pain      . oxyCODONE-acetaminophen (PERCOCET/ROXICET) 5-325 MG per tablet Take 1 tablet by mouth as directed.      . potassium chloride (K-DUR,KLOR-CON) 10 MEQ tablet Take 1 tablet (10 mEq total) by mouth daily.  30 tablet  6  . Rivaroxaban (XARELTO) 15 MG TABS tablet Take 15 mg by mouth daily.      . rosuvastatin (CRESTOR) 20 MG tablet Take 1 tablet (20 mg total) by mouth daily at 6 PM.  30 tablet  6  . traZODone (DESYREL) 50 MG tablet Take 50-100 mg by mouth at bedtime.       No current facility-administered medications for this visit.    Past Medical History  Diagnosis Date  . Coronary atherosclerosis of native coronary artery     a. CABG x 4 in 1994. Previous stent placement to SVG to  distal RCA;  b. DES to native Cx in 2006;  c. DES SVG to RCA 05/08/11; d. Inf MI/Cath/PCI: LM 25, LAD 100, LCX patent stent, 50 into OM, RI 60-70, RCA 100, LIMA->LAD nl, VG->Diag 100 old, VG->RCA/PL 99 (DES x 1), EF 50-55%.  . Sleep apnea   . Essential hypertension, benign   . Depression     With anxious features/hx panic attacks  . Morbid obesity   . Hyperlipidemia     Not on statin 2/2 hx of intolerance.   . Nephrolithiasis   . Allergic rhinitis   . Type 2 diabetes mellitus   . ST elevation myocardial infarction (STEMI) of inferior wall     4/14 - DES SVG to PDA    Social History Mr. Gensemer reports that he has never smoked. He has never used smokeless tobacco. Mr. Langa reports that he does not drink alcohol.  Review of Systems Negative except as outlined.  Physical  Examination Filed Vitals:   11/21/12 1355  BP: 124/62  Pulse: 81   Filed Weights   11/21/12 1355  Weight: 277 lb (125.646 kg)    Morbidly obese male in no acute distress.  HEENT: Conjunctiva and lids normal, oropharynx clear.  Neck: Supple, no elevated JVP or carotid bruits, no thyromegaly.  Lungs: Clear to auscultation, nonlabored breathing at rest.  Cardiac: Regular rate and rhythm, no S3, no pericardial rub.  Abdomen: Soft, nontender, bowel sounds present, no guarding or rebound.  Extremities: No pitting edema, distal pulses 1-2+.  Skin: Warm and dry.  Musculoskeletal: No kyphosis.  Neuropsychiatric: Alert and oriented x3, affect grossly appropriate.   Problem List and Plan   PAC (premature atrial contraction) Had PACs noted on recent ECG, no clear evidence of atrial fibrillation. Would discontinue Xarelto and go back on aspirin daily.  Hyperlipidemia Recent numbers reviewed, LDL quite low at 16. I recommended that he stop Crestor for now in light of his back pain symptoms. See if there is any correlation. If not, we might consider resuming Crestor at 5 mg daily. He will call us back to let us know how he is doing.  Coronary atherosclerosis of native coronary artery No active angina, status post DES to the SVG to PDA in April 2014. As noted above, would stop Xarelto and continue Plavix and aspirin.    Satira Sark, M.D., F.A.C.C.

## 2012-11-21 NOTE — Assessment & Plan Note (Signed)
Had PACs noted on recent ECG, no clear evidence of atrial fibrillation. Would discontinue Xarelto and go back on aspirin daily.

## 2012-11-21 NOTE — Assessment & Plan Note (Signed)
Recent numbers reviewed, LDL quite low at 16. I recommended that he stop Crestor for now in light of his back pain symptoms. See if there is any correlation. If not, we might consider resuming Crestor at 5 mg daily. He will call us back to let us know how he is doing.

## 2012-11-21 NOTE — Patient Instructions (Addendum)
Your physician recommends that you schedule a follow-up appointment in: [address] [facility]  Your physician has recommended you make the following change in your medication:   1) STOP TAKING XARELTO 2) START TAKING ASPIRIN 81 MG ONCE DAILY 3) STOP TAKING CRESTOR FOR 3 WEEKS, CALL OUR OFFICE WITH AN UPDATE (IF PAIN SUBSIDES/OR IF IT DOES NOT SUBSIDE) [phone]

## 2012-11-22 ENCOUNTER — Encounter: Payer: Self-pay | Admitting: Cardiology

## 2012-11-23 ENCOUNTER — Other Ambulatory Visit: Payer: Self-pay | Admitting: Cardiology

## 2012-11-23 MED ORDER — CLOPIDOGREL BISULFATE 75 MG PO TABS: 75.0000 mg | ORAL_TABLET | Freq: Every day | ORAL | Status: DC

## 2013-02-19 ENCOUNTER — Other Ambulatory Visit (HOSPITAL_COMMUNITY): Payer: Self-pay | Admitting: Nurse Practitioner

## 2013-02-23 ENCOUNTER — Ambulatory Visit (INDEPENDENT_AMBULATORY_CARE_PROVIDER_SITE_OTHER): Payer: Medicare Other | Admitting: Cardiology

## 2013-02-23 ENCOUNTER — Encounter: Payer: Self-pay | Admitting: Cardiology

## 2013-02-23 VITALS — BP 139/77 | HR 74 | Ht 64.0 in | Wt 280.0 lb

## 2013-02-23 DIAGNOSIS — E785 Hyperlipidemia, unspecified: Secondary | ICD-10-CM

## 2013-02-23 DIAGNOSIS — I2510 Atherosclerotic heart disease of native coronary artery without angina pectoris: Secondary | ICD-10-CM

## 2013-02-23 DIAGNOSIS — I10 Essential (primary) hypertension: Secondary | ICD-10-CM

## 2013-02-23 NOTE — Assessment & Plan Note (Signed)
Blood pressure is reasonable today. 

## 2013-02-23 NOTE — Assessment & Plan Note (Signed)
Leg pain is much better off Crestor. He was not interested in trying a low-dose statin. Recent LDL 121.

## 2013-02-23 NOTE — Progress Notes (Signed)
Clinical Summary [name] is a 79 y.o.male last seen in August. Medication adjustments were made at that time. He is here with his wife for followup. He does state that his legs feel much better after being off Crestor. He has occasional angina symptoms, left arm discomfort. Uses nitroglycerin spray.  Recent labwork showed hemoglobin 14.0, BUN 36, creatinine 1.57, normal AST and ALT, cholesterol 217, triglycerides, 299, HDL 36, LDL 121. We discussed trying to use very low-dose Crestor or perhaps Pravachol, but at this point he was not inclined to try different statin.  We reviewed his cardiac medications and discussed trying to simplify things somewhat. ECG today shows sinus rhythm with low voltage, PAC and PVC , nonspecific ST changes.   Allergies  Allergen Reactions  . Zocor [Simvastatin] Other (See Comments)    fatigue  . Lipitor [Atorvastatin]     Current Outpatient Prescriptions  Medication Sig Dispense Refill  . alprazolam (XANAX) 2 MG tablet Take 1/3 tablet by mouth every 2 hours as needed through out the night      . clopidogrel (PLAVIX) 75 MG tablet Take 1 tablet (75 mg total) by mouth daily with breakfast.  90 tablet  1  . cyclobenzaprine (FLEXERIL) 10 MG tablet Take 10 mg by mouth 3 (three) times daily as needed for muscle spasms.      Marland Kitchen diltiazem (DILACOR XR) 240 MG 24 hr capsule Take 240 mg by mouth daily.      . furosemide (LASIX) 40 MG tablet take 1 tablet once daily  30 tablet  1  . glipiZIDE (GLUCOTROL) 10 MG tablet Take 10 mg by mouth 2 (two) times daily before a meal.      . HYDROcodone-acetaminophen (LORTAB) 10-500 MG per tablet Take 1 tablet by mouth every 6 (six) hours as needed for pain.      Marland Kitchen insulin glargine (LANTUS) 100 UNIT/ML injection Inject 100 Units into the skin every morning.      . isosorbide mononitrate (IMDUR) 30 MG 24 hr tablet Take 1 tablet (30 mg total) by mouth daily.  30 tablet  6  . ketoconazole (NIZORAL) 2 % shampoo Apply 1 application  topically as needed.       Marland Kitchen lisinopril-hydrochlorothiazide (PRINZIDE,ZESTORETIC) 20-12.5 MG per tablet Take 1 tablet by mouth daily.      . nitroGLYCERIN (NITROLINGUAL) 0.4 MG/SPRAY spray Place 1 spray under the tongue every 5 (five) minutes as needed. For chest pain      . oxyCODONE-acetaminophen (PERCOCET/ROXICET) 5-325 MG per tablet Take 1 tablet by mouth as directed.      . polyethylene glycol powder (GLYCOLAX/MIRALAX) powder Take 17 g by mouth as needed.       . potassium chloride (K-DUR,KLOR-CON) 10 MEQ tablet Take 1 tablet (10 mEq total) by mouth daily.  30 tablet  6  . tamsulosin (FLOMAX) 0.4 MG CAPS capsule Take 0.4 mg by mouth daily after supper.       . traZODone (DESYREL) 50 MG tablet Take 50-100 mg by mouth at bedtime.       No current facility-administered medications for this visit.    Past Medical History  Diagnosis Date  . Coronary atherosclerosis of native coronary artery     a. CABG x 4 in 1994. Previous stent placement to SVG to distal RCA;  b. DES to native Cx in 2006;  c. DES SVG to RCA 05/08/11; d. Inf MI/Cath/PCI: LM 25, LAD 100, LCX patent stent, 50 into OM, RI 60-70, RCA 100, LIMA->LAD  nl, VG->Diag 100 old, VG->RCA/PL 99 (DES x 1), EF 50-55%.  . Sleep apnea   . Essential hypertension, benign   . Depression     With anxious features/hx panic attacks  . Morbid obesity   . Hyperlipidemia     Not on statin 2/2 hx of intolerance.   . Nephrolithiasis   . Allergic rhinitis   . Type 2 diabetes mellitus   . ST elevation myocardial infarction (STEMI) of inferior wall     4/14 - DES SVG to PDA    Past Surgical History  Procedure Laterality Date  . Coronary artery bypass graft  1994  . Eye surgery      Social History [name] reports that he has never smoked. He has never used smokeless tobacco. [name] reports that he does not drink alcohol.  Review of Systems No palpitations or syncope. Stable appetite. Fairly inactive at baseline. No falls. Otherwise  negative.  Physical Examination Filed Vitals:   02/23/13 1317  BP: 139/77  Pulse: 74   Filed Weights   02/23/13 1317  Weight: 280 lb (127.007 kg)    Morbidly obese male in no acute distress.  HEENT: Conjunctiva and lids normal, oropharynx clear.  Neck: Supple, no elevated JVP or carotid bruits, no thyromegaly.  Lungs: Clear to auscultation, nonlabored breathing at rest.  Cardiac: Regular rate and rhythm, no S3, no pericardial rub.  Abdomen: Soft, nontender, bowel sounds present, no guarding or rebound.  Extremities: No pitting edema, distal pulses 1-2+.  Skin: Warm and dry.  Musculoskeletal: No kyphosis.  Neuropsychiatric: Alert and oriented x3, affect grossly appropriate.   Problem List and Plan   Coronary atherosclerosis of native coronary artery Continue medical therapy and observation. ECG reviewed. Would like to try and take him off Lopressor, he is already on a good dose of diltiazem CD.  Hyperlipidemia Leg pain is much better off Crestor. He was not interested in trying a low-dose statin. Recent LDL 121.  Essential hypertension, benign Blood pressure is reasonable today.    Satira Sark, M.D., F.A.C.C.

## 2013-02-23 NOTE — Patient Instructions (Signed)
   Decrease Lopressor to 1/2 tab twice a day till finish current supply, then STOP Continue all other medications.   Follow up in  4 months

## 2013-02-23 NOTE — Assessment & Plan Note (Signed)
Continue medical therapy and observation. ECG reviewed. Would like to try and take him off Lopressor, he is already on a good dose of diltiazem CD.

## 2013-04-21 ENCOUNTER — Other Ambulatory Visit: Payer: Self-pay | Admitting: Cardiology

## 2013-04-21 MED ORDER — FUROSEMIDE 40 MG PO TABS: ORAL_TABLET | ORAL | Status: DC

## 2013-04-21 MED ORDER — ISOSORBIDE MONONITRATE ER 30 MG PO TB24: 30.0000 mg | ORAL_TABLET | Freq: Every day | ORAL | Status: DC

## 2013-07-05 ENCOUNTER — Encounter: Payer: Self-pay | Admitting: Cardiology

## 2013-07-05 ENCOUNTER — Ambulatory Visit (INDEPENDENT_AMBULATORY_CARE_PROVIDER_SITE_OTHER): Payer: Medicare Other | Admitting: Cardiology

## 2013-07-05 VITALS — BP 148/72 | HR 77 | Ht 65.0 in | Wt 279.0 lb

## 2013-07-05 DIAGNOSIS — E785 Hyperlipidemia, unspecified: Secondary | ICD-10-CM

## 2013-07-05 DIAGNOSIS — I2510 Atherosclerotic heart disease of native coronary artery without angina pectoris: Secondary | ICD-10-CM

## 2013-07-05 DIAGNOSIS — I10 Essential (primary) hypertension: Secondary | ICD-10-CM

## 2013-07-05 NOTE — Patient Instructions (Signed)

## 2013-07-05 NOTE — Assessment & Plan Note (Signed)
No change antihypertensive regimen. Keep follow up with Dr. Quintin Alto.

## 2013-07-05 NOTE — Assessment & Plan Note (Signed)
Has had significant statin intolerance.

## 2013-07-05 NOTE — Progress Notes (Signed)
Clinical Summary [name] is a 80 y.o.male last seen in November 2014. He is here with his wife. Still feels somewhat unsteady on his feet, uses a walker, no recent falls. He is not reporting any angina. Remains very overweight, functionally limited in his typical activities.  We reviewed his medications. We stopped Lopressor at the last visit and continued Cardizem CD. His wife says that he gets confused sometimes and will take his medications incorrectly.  At the last visit, we discussed different statin treatments, he had intolerance to Crestor, LDL was 121. He declined any other agents.   Allergies  Allergen Reactions  . Zocor [Simvastatin] Other (See Comments)    fatigue  . Lipitor [Atorvastatin]     Current Outpatient Prescriptions  Medication Sig Dispense Refill  . alprazolam (XANAX) 2 MG tablet Take 1/3 tablet by mouth every 2 hours as needed through out the night      . clopidogrel (PLAVIX) 75 MG tablet Take 1 tablet (75 mg total) by mouth daily with breakfast.  90 tablet  1  . cyclobenzaprine (FLEXERIL) 10 MG tablet Take 10 mg by mouth 3 (three) times daily as needed for muscle spasms.      Marland Kitchen diltiazem (DILACOR XR) 240 MG 24 hr capsule Take 240 mg by mouth daily.      . furosemide (LASIX) 40 MG tablet take 1 tablet once daily  30 tablet  6  . glipiZIDE (GLUCOTROL) 10 MG tablet Take 10 mg by mouth 2 (two) times daily before a meal.      . insulin glargine (LANTUS) 100 UNIT/ML injection Inject 100 Units into the skin every morning.      . isosorbide mononitrate (IMDUR) 30 MG 24 hr tablet Take 1 tablet (30 mg total) by mouth daily.  30 tablet  6  . ketoconazole (NIZORAL) 2 % shampoo Apply 1 application topically as needed.       Marland Kitchen lisinopril-hydrochlorothiazide (PRINZIDE,ZESTORETIC) 20-12.5 MG per tablet Take 1 tablet by mouth daily.      . nitroGLYCERIN (NITROLINGUAL) 0.4 MG/SPRAY spray Place 1 spray under the tongue every 5 (five) minutes as needed. For chest pain      .  oxyCODONE-acetaminophen (PERCOCET/ROXICET) 5-325 MG per tablet Take 1 tablet by mouth as directed.      . polyethylene glycol powder (GLYCOLAX/MIRALAX) powder Take 17 g by mouth as needed.       . tamsulosin (FLOMAX) 0.4 MG CAPS capsule Take 0.4 mg by mouth daily after supper.        No current facility-administered medications for this visit.    Past Medical History  Diagnosis Date  . Coronary atherosclerosis of native coronary artery     a. CABG x 4 in 1994. Previous stent placement to SVG to distal RCA;  b. DES to native Cx in 2006;  c. DES SVG to RCA 05/08/11; d. Inf MI/Cath/PCI: LM 25, LAD 100, LCX patent stent, 50 into OM, RI 60-70, RCA 100, LIMA->LAD nl, VG->Diag 100 old, VG->RCA/PL 99 (DES x 1), EF 50-55%.  . Sleep apnea   . Essential hypertension, benign   . Depression     With anxious features/hx panic attacks  . Morbid obesity   . Hyperlipidemia     Not on statin 2/2 hx of intolerance.   . Nephrolithiasis   . Allergic rhinitis   . Type 2 diabetes mellitus   . ST elevation myocardial infarction (STEMI) of inferior wall     4/14 - DES SVG to  PDA    Past Surgical History  Procedure Laterality Date  . Coronary artery bypass graft  1994  . Eye surgery      Social History Mr. Yeley reports that he has never smoked. He has never used smokeless tobacco. Mr. Bevilacqua reports that he does not drink alcohol.  Review of Systems Somewhat hard of hearing. Stable appetite. No bleeding problems. No orthopnea. Otherwise as outlined.  Physical Examination Filed Vitals:   07/05/13 1447  BP: 148/72  Pulse: 77   Filed Weights   07/05/13 1447  Weight: 279 lb (126.554 kg)    Morbidly obese male in no acute distress.  HEENT: Conjunctiva and lids normal, oropharynx clear.  Neck: Supple, no elevated JVP or carotid bruits, no thyromegaly.  Lungs: Clear to auscultation, nonlabored breathing at rest.  Cardiac: Regular rate and rhythm, no S3, no pericardial rub.  Abdomen: Soft,  nontender, bowel sounds present, no guarding or rebound.  Extremities: No pitting edema, distal pulses 1-2+.  Skin: Warm and dry.  Musculoskeletal: No kyphosis.  Neuropsychiatric: Alert and oriented x3, affect grossly appropriate.   Problem List and Plan   Coronary atherosclerosis of native coronary artery Continue medical therapy and observation. History of multivessel and graft disease, last intervention was DES to the RCA/posteriolateral. He remains on Plavix.  Essential hypertension, benign No change antihypertensive regimen. Keep follow up with Dr. Quintin Alto.  Hyperlipidemia Has had significant statin intolerance.    Satira Sark, M.D., F.A.C.C.

## 2013-07-05 NOTE — Assessment & Plan Note (Signed)
Continue medical therapy and observation. History of multivessel and graft disease, last intervention was DES to the RCA/posteriolateral. He remains on Plavix.

## 2013-10-04 ENCOUNTER — Other Ambulatory Visit: Payer: Self-pay | Admitting: *Deleted

## 2013-10-04 MED ORDER — CLOPIDOGREL BISULFATE 75 MG PO TABS: 75.0000 mg | ORAL_TABLET | Freq: Every day | ORAL | Status: DC

## 2013-11-01 ENCOUNTER — Other Ambulatory Visit: Payer: Self-pay | Admitting: *Deleted

## 2013-11-01 MED ORDER — CLOPIDOGREL BISULFATE 75 MG PO TABS: 75.0000 mg | ORAL_TABLET | Freq: Every day | ORAL | Status: DC

## 2013-12-05 ENCOUNTER — Other Ambulatory Visit: Payer: Self-pay | Admitting: *Deleted

## 2013-12-05 MED ORDER — ISOSORBIDE MONONITRATE ER 30 MG PO TB24: 30.0000 mg | ORAL_TABLET | Freq: Every day | ORAL | Status: DC

## 2013-12-19 ENCOUNTER — Other Ambulatory Visit: Payer: Self-pay | Admitting: *Deleted

## 2013-12-19 MED ORDER — FUROSEMIDE 40 MG PO TABS: ORAL_TABLET | ORAL | Status: DC

## 2014-01-01 ENCOUNTER — Other Ambulatory Visit: Payer: Self-pay | Admitting: *Deleted

## 2014-01-01 ENCOUNTER — Encounter: Payer: Self-pay | Admitting: Cardiology

## 2014-01-01 ENCOUNTER — Ambulatory Visit (INDEPENDENT_AMBULATORY_CARE_PROVIDER_SITE_OTHER): Payer: Medicare Other | Admitting: Cardiology

## 2014-01-01 VITALS — BP 128/70 | HR 79 | Ht 65.0 in | Wt 279.4 lb

## 2014-01-01 DIAGNOSIS — I2510 Atherosclerotic heart disease of native coronary artery without angina pectoris: Secondary | ICD-10-CM

## 2014-01-01 DIAGNOSIS — E1122 Type 2 diabetes mellitus with diabetic chronic kidney disease: Secondary | ICD-10-CM | POA: Insufficient documentation

## 2014-01-01 DIAGNOSIS — E785 Hyperlipidemia, unspecified: Secondary | ICD-10-CM

## 2014-01-01 DIAGNOSIS — N183 Chronic kidney disease, stage 3 (moderate): Secondary | ICD-10-CM

## 2014-01-01 DIAGNOSIS — I10 Essential (primary) hypertension: Secondary | ICD-10-CM

## 2014-01-01 DIAGNOSIS — N289 Disorder of kidney and ureter, unspecified: Secondary | ICD-10-CM

## 2014-01-01 MED ORDER — FUROSEMIDE 40 MG PO TABS: ORAL_TABLET | ORAL | Status: DC

## 2014-01-01 MED ORDER — ISOSORBIDE MONONITRATE ER 30 MG PO TB24: 30.0000 mg | ORAL_TABLET | Freq: Every day | ORAL | Status: DC

## 2014-01-01 NOTE — Assessment & Plan Note (Signed)
Blood pressure is well-controlled today. 

## 2014-01-01 NOTE — Patient Instructions (Signed)

## 2014-01-01 NOTE — Assessment & Plan Note (Signed)
History of statin intolerance. 

## 2014-01-01 NOTE — Progress Notes (Signed)
Clinical Summary [name] is a 80 y.o.male last seen in March. He is here with his wife. Reports no angina symptoms. States that he has had a recent nephrology evaluation secondary to renal insufficiency and "small kidney" by ultrasound at [facility]. We are requesting lab work for review.  Otherwise, no change in cardiac regimen. We took him off beta blocker previously, he has done well on the remainder. Blood pressure and heart rate are reasonable.   Allergies  Allergen Reactions  . Zocor [Simvastatin] Other (See Comments)    fatigue  . Lipitor [Atorvastatin]     Current Outpatient Prescriptions  Medication Sig Dispense Refill  . alprazolam (XANAX) 2 MG tablet Take 1/3 tablet by mouth every 2 hours as needed through out the night      . clopidogrel (PLAVIX) 75 MG tablet Take 1 tablet (75 mg total) by mouth daily with breakfast.  30 tablet  6  . cyclobenzaprine (FLEXERIL) 10 MG tablet Take 10 mg by mouth 3 (three) times daily as needed for muscle spasms.      Marland Kitchen diltiazem (DILACOR XR) 240 MG 24 hr capsule Take 240 mg by mouth daily.      . furosemide (LASIX) 40 MG tablet take 1 tablet once daily  30 tablet  6  . glipiZIDE (GLUCOTROL) 10 MG tablet Take 10 mg by mouth 2 (two) times daily before a meal.      . insulin glargine (LANTUS) 100 UNIT/ML injection Inject 100 Units into the skin every morning.      . isosorbide mononitrate (IMDUR) 30 MG 24 hr tablet Take 1 tablet (30 mg total) by mouth daily.  30 tablet  5  . ketoconazole (NIZORAL) 2 % shampoo Apply 1 application topically as needed.       Marland Kitchen lisinopril-hydrochlorothiazide (PRINZIDE,ZESTORETIC) 20-12.5 MG per tablet Take 2 tablets by mouth daily.      . nitroGLYCERIN (NITROLINGUAL) 0.4 MG/SPRAY spray Place 1 spray under the tongue every 5 (five) minutes as needed. For chest pain      . oxyCODONE-acetaminophen (PERCOCET/ROXICET) 5-325 MG per tablet Take 1 tablet by mouth as needed.       . polyethylene glycol powder  (GLYCOLAX/MIRALAX) powder Take 17 g by mouth as needed.       . tamsulosin (FLOMAX) 0.4 MG CAPS capsule Take 0.4 mg by mouth daily after supper.        No current facility-administered medications for this visit.    Past Medical History  Diagnosis Date  . Coronary atherosclerosis of native coronary artery     a. CABG x 4 in 1994. Previous stent placement to SVG to distal RCA;  b. DES to native Cx in 2006;  c. DES SVG to RCA 05/08/11; d. Inf MI/Cath/PCI: LM 25, LAD 100, LCX patent stent, 50 into OM, RI 60-70, RCA 100, LIMA->LAD nl, VG->Diag 100 old, VG->RCA/PL 99 (DES x 1), EF 50-55%.  . Sleep apnea   . Essential hypertension, benign   . Depression     With anxious features/hx panic attacks  . Morbid obesity   . Hyperlipidemia     Not on statin 2/2 hx of intolerance.   . Nephrolithiasis   . Allergic rhinitis   . Type 2 diabetes mellitus   . ST elevation myocardial infarction (STEMI) of inferior wall     4/14 - DES SVG to PDA    Past Surgical History  Procedure Laterality Date  . Coronary artery bypass graft  1994  . Eye  surgery      Social History Mr. Bredehoft reports that he has never smoked. He has never used smokeless tobacco. Mr. Pesicka reports that he does not drink alcohol.  Review of Systems Hard of hearing. No angina. No palpitations. He uses a cane to walk. No falls. Other systems reviewed and negative.  Physical Examination Filed Vitals:   01/01/14 1256  BP: 128/70  Pulse: 79   Filed Weights   01/01/14 1256  Weight: 279 lb 6.4 oz (126.735 kg)    Morbidly obese male in no acute distress.  HEENT: Conjunctiva and lids normal, oropharynx clear.  Neck: Supple, no elevated JVP or carotid bruits, no thyromegaly.  Lungs: Clear to auscultation, nonlabored breathing at rest.  Cardiac: Regular rate and rhythm, no S3, no pericardial rub.  Abdomen: Soft, nontender, bowel sounds present, no guarding or rebound.  Extremities: No pitting edema, distal pulses 1-2+.    Skin: Warm and dry.  Musculoskeletal: No kyphosis.  Neuropsychiatric: Alert and oriented x3, affect grossly appropriate.   Problem List and Plan   Coronary atherosclerosis of native coronary artery Symptomatically stable on medical therapy. No changes were made today. Continue observation.  Renal insufficiency Details not clear. Requesting lab work from [facility].  Essential hypertension, benign Blood pressure is well-controlled today.  Hyperlipidemia History of statin intolerance.    Satira Sark, M.D., F.A.C.C.

## 2014-01-01 NOTE — Assessment & Plan Note (Signed)
Details not clear. Requesting lab work from [facility].

## 2014-01-01 NOTE — Assessment & Plan Note (Signed)
Symptomatically stable on medical therapy. No changes were made today. Continue observation.

## 2014-02-12 ENCOUNTER — Other Ambulatory Visit: Payer: Self-pay | Admitting: *Deleted

## 2014-02-12 MED ORDER — CLOPIDOGREL BISULFATE 75 MG PO TABS: 75.0000 mg | ORAL_TABLET | Freq: Every day | ORAL | Status: DC

## 2014-03-22 ENCOUNTER — Encounter (HOSPITAL_COMMUNITY): Payer: Self-pay | Admitting: Cardiology

## 2014-05-25 ENCOUNTER — Encounter (HOSPITAL_COMMUNITY): Payer: Self-pay | Admitting: *Deleted

## 2014-05-25 ENCOUNTER — Inpatient Hospital Stay (HOSPITAL_COMMUNITY)
Admission: AD | Admit: 2014-05-25 | Discharge: 2014-05-28 | DRG: 280 | Disposition: A | Discharge status: Home Health | Payer: Medicare Other | Source: Other Acute Inpatient Hospital | Attending: Cardiovascular Disease | Admitting: Cardiovascular Disease

## 2014-05-25 ENCOUNTER — Encounter (HOSPITAL_COMMUNITY)
Admission: AD | Disposition: A | Discharge status: Home Health | Payer: Self-pay | Source: Other Acute Inpatient Hospital | Attending: Cardiovascular Disease

## 2014-05-25 DIAGNOSIS — E1165 Type 2 diabetes mellitus with hyperglycemia: Secondary | ICD-10-CM | POA: Diagnosis present

## 2014-05-25 DIAGNOSIS — Z794 Long term (current) use of insulin: Secondary | ICD-10-CM

## 2014-05-25 DIAGNOSIS — Z955 Presence of coronary angioplasty implant and graft: Secondary | ICD-10-CM

## 2014-05-25 DIAGNOSIS — I252 Old myocardial infarction: Secondary | ICD-10-CM | POA: Diagnosis not present

## 2014-05-25 DIAGNOSIS — Z7982 Long term (current) use of aspirin: Secondary | ICD-10-CM

## 2014-05-25 DIAGNOSIS — I509 Heart failure, unspecified: Secondary | ICD-10-CM

## 2014-05-25 DIAGNOSIS — I5033 Acute on chronic diastolic (congestive) heart failure: Secondary | ICD-10-CM | POA: Diagnosis present

## 2014-05-25 DIAGNOSIS — I129 Hypertensive chronic kidney disease with stage 1 through stage 4 chronic kidney disease, or unspecified chronic kidney disease: Secondary | ICD-10-CM | POA: Diagnosis present

## 2014-05-25 DIAGNOSIS — J309 Allergic rhinitis, unspecified: Secondary | ICD-10-CM | POA: Diagnosis present

## 2014-05-25 DIAGNOSIS — M25571 Pain in right ankle and joints of right foot: Secondary | ICD-10-CM | POA: Diagnosis not present

## 2014-05-25 DIAGNOSIS — G4733 Obstructive sleep apnea (adult) (pediatric): Secondary | ICD-10-CM | POA: Diagnosis present

## 2014-05-25 DIAGNOSIS — Z87442 Personal history of urinary calculi: Secondary | ICD-10-CM

## 2014-05-25 DIAGNOSIS — N184 Chronic kidney disease, stage 4 (severe): Secondary | ICD-10-CM | POA: Diagnosis present

## 2014-05-25 DIAGNOSIS — Z79899 Other long term (current) drug therapy: Secondary | ICD-10-CM

## 2014-05-25 DIAGNOSIS — D649 Anemia, unspecified: Secondary | ICD-10-CM | POA: Diagnosis present

## 2014-05-25 DIAGNOSIS — I214 Non-ST elevation (NSTEMI) myocardial infarction: Secondary | ICD-10-CM | POA: Diagnosis present

## 2014-05-25 DIAGNOSIS — Z888 Allergy status to other drugs, medicaments and biological substances status: Secondary | ICD-10-CM

## 2014-05-25 DIAGNOSIS — F329 Major depressive disorder, single episode, unspecified: Secondary | ICD-10-CM | POA: Diagnosis present

## 2014-05-25 DIAGNOSIS — Z951 Presence of aortocoronary bypass graft: Secondary | ICD-10-CM | POA: Diagnosis not present

## 2014-05-25 DIAGNOSIS — E785 Hyperlipidemia, unspecified: Secondary | ICD-10-CM | POA: Diagnosis present

## 2014-05-25 DIAGNOSIS — F419 Anxiety disorder, unspecified: Secondary | ICD-10-CM | POA: Diagnosis present

## 2014-05-25 DIAGNOSIS — M109 Gout, unspecified: Secondary | ICD-10-CM | POA: Diagnosis present

## 2014-05-25 DIAGNOSIS — Z6841 Body Mass Index (BMI) 40.0 and over, adult: Secondary | ICD-10-CM

## 2014-05-25 DIAGNOSIS — N189 Chronic kidney disease, unspecified: Secondary | ICD-10-CM

## 2014-05-25 DIAGNOSIS — I25119 Atherosclerotic heart disease of native coronary artery with unspecified angina pectoris: Secondary | ICD-10-CM | POA: Diagnosis present

## 2014-05-25 DIAGNOSIS — Z9981 Dependence on supplemental oxygen: Secondary | ICD-10-CM

## 2014-05-25 DIAGNOSIS — N179 Acute kidney failure, unspecified: Secondary | ICD-10-CM | POA: Diagnosis present

## 2014-05-25 DIAGNOSIS — Z7902 Long term (current) use of antithrombotics/antiplatelets: Secondary | ICD-10-CM | POA: Diagnosis not present

## 2014-05-25 DIAGNOSIS — Z8249 Family history of ischemic heart disease and other diseases of the circulatory system: Secondary | ICD-10-CM | POA: Diagnosis not present

## 2014-05-25 DIAGNOSIS — R079 Chest pain, unspecified: Secondary | ICD-10-CM

## 2014-05-25 HISTORY — DX: Unspecified osteoarthritis, unspecified site: M19.90

## 2014-05-25 HISTORY — DX: Chronic obstructive pulmonary disease, unspecified: J44.9

## 2014-05-25 HISTORY — DX: Gastro-esophageal reflux disease without esophagitis: K21.9

## 2014-05-25 HISTORY — DX: Pneumonia, unspecified organism: J18.9

## 2014-05-25 LAB — CBC
HCT: 39.4 % (ref 39.0–52.0)
HEMOGLOBIN: 12.8 g/dL — AB (ref 13.0–17.0)
MCH: 29 pg (ref 26.0–34.0)
MCHC: 32.5 g/dL (ref 30.0–36.0)
MCV: 89.1 fL (ref 78.0–100.0)
Platelets: 184 10*3/uL (ref 150–400)
RBC: 4.42 MIL/uL (ref 4.22–5.81)
RDW: 13.2 % (ref 11.5–15.5)
WBC: 6.5 10*3/uL (ref 4.0–10.5)

## 2014-05-25 LAB — LIPID PANEL
CHOL/HDL RATIO: 4.2 ratio
CHOLESTEROL: 185 mg/dL (ref 0–200)
HDL: 44 mg/dL (ref >39)
LDL Cholesterol: 123 mg/dL — ABNORMAL HIGH (ref 0–99)
Triglycerides: 92 mg/dL (ref <150)
VLDL: 18 mg/dL (ref 0–40)

## 2014-05-25 LAB — BASIC METABOLIC PANEL
Anion gap: 9 (ref 5–15)
BUN: 52 mg/dL — ABNORMAL HIGH (ref 6–23)
CHLORIDE: 100 mmol/L (ref 96–112)
CO2: 26 mmol/L (ref 19–32)
Calcium: 8.9 mg/dL (ref 8.4–10.5)
Creatinine, Ser: 2.33 mg/dL — ABNORMAL HIGH (ref 0.50–1.35)
GFR calc Af Amer: 29 mL/min — ABNORMAL LOW (ref >90)
GFR, EST NON AFRICAN AMERICAN: 25 mL/min — AB (ref >90)
GLUCOSE: 351 mg/dL — AB (ref 70–99)
POTASSIUM: 5.2 mmol/L — AB (ref 3.5–5.1)
Sodium: 135 mmol/L (ref 135–145)

## 2014-05-25 LAB — GLUCOSE, CAPILLARY
Glucose-Capillary: 313 mg/dL — ABNORMAL HIGH (ref 70–99)
Glucose-Capillary: 289 mg/dL — ABNORMAL HIGH (ref 70–99)
Glucose-Capillary: 281 mg/dL — ABNORMAL HIGH (ref 70–99)

## 2014-05-25 LAB — CBC WITH DIFFERENTIAL/PLATELET
BASOS ABS: 0 10*3/uL (ref 0.0–0.1)
BASOS PCT: 0 % (ref 0–1)
Eosinophils Absolute: 0 10*3/uL (ref 0.0–0.7)
Eosinophils Relative: 0 % (ref 0–5)
HCT: 40.3 % (ref 39.0–52.0)
Hemoglobin: 13.1 g/dL (ref 13.0–17.0)
Lymphocytes Relative: 16 % (ref 12–46)
Lymphs Abs: 1 10*3/uL (ref 0.7–4.0)
MCH: 29 pg (ref 26.0–34.0)
MCHC: 32.5 g/dL (ref 30.0–36.0)
MCV: 89.4 fL (ref 78.0–100.0)
MONOS PCT: 2 % — AB (ref 3–12)
Monocytes Absolute: 0.1 10*3/uL (ref 0.1–1.0)
NEUTROS PCT: 82 % — AB (ref 43–77)
Neutro Abs: 5.1 10*3/uL (ref 1.7–7.7)
Platelets: 184 10*3/uL (ref 150–400)
RBC: 4.51 MIL/uL (ref 4.22–5.81)
RDW: 13.4 % (ref 11.5–15.5)
WBC: 6.2 10*3/uL (ref 4.0–10.5)

## 2014-05-25 LAB — HEPARIN LEVEL (UNFRACTIONATED)
HEPARIN UNFRACTIONATED: 0.12 [IU]/mL — AB (ref 0.30–0.70)
Heparin Unfractionated: 0.12 IU/mL — ABNORMAL LOW (ref 0.30–0.70)

## 2014-05-25 LAB — APTT: aPTT: 50 seconds — ABNORMAL HIGH (ref 24–37)

## 2014-05-25 LAB — MRSA PCR SCREENING: MRSA BY PCR: NEGATIVE

## 2014-05-25 LAB — TROPONIN I
TROPONIN I: 0.5 ng/mL — AB (ref <0.031)
TROPONIN I: 0.59 ng/mL — AB (ref <0.031)
Troponin I: 0.56 ng/mL (ref <0.031)

## 2014-05-25 SURGERY — LEFT HEART CATHETERIZATION WITH CORONARY ANGIOGRAM
Anesthesia: LOCAL

## 2014-05-25 MED ORDER — ISOSORBIDE MONONITRATE ER 60 MG PO TB24
60.0000 mg | ORAL_TABLET | Freq: Every day | ORAL | Status: DC
  Administered 2014-05-26 – 2014-05-28 (×3): 60 mg via ORAL
  Filled 2014-05-25 (×3): qty 1

## 2014-05-25 MED ORDER — ALPRAZOLAM 0.5 MG PO TABS
1.0000 mg | ORAL_TABLET | Freq: Three times a day (TID) | ORAL | Status: DC | PRN
  Administered 2014-05-25 – 2014-05-27 (×5): 1 mg via ORAL
  Filled 2014-05-25 (×7): qty 2

## 2014-05-25 MED ORDER — INSULIN GLARGINE 100 UNIT/ML ~~LOC~~ SOLN
100.0000 [IU] | Freq: Every day | SUBCUTANEOUS | Status: DC
  Administered 2014-05-25: 80 [IU] via SUBCUTANEOUS
  Filled 2014-05-25 (×2): qty 1

## 2014-05-25 MED ORDER — HEPARIN BOLUS VIA INFUSION
3000.0000 [IU] | Freq: Once | INTRAVENOUS | Status: AC
  Administered 2014-05-25: 3000 [IU] via INTRAVENOUS
  Filled 2014-05-25: qty 3000

## 2014-05-25 MED ORDER — ASPIRIN 81 MG PO CHEW: 324.0000 mg | CHEWABLE_TABLET | ORAL | Status: AC

## 2014-05-25 MED ORDER — NITROGLYCERIN 0.4 MG SL SUBL
0.4000 mg | SUBLINGUAL_TABLET | SUBLINGUAL | Status: DC | PRN
  Administered 2014-05-25: 0.4 mg via SUBLINGUAL
  Filled 2014-05-25: qty 1

## 2014-05-25 MED ORDER — HEPARIN (PORCINE) IN NACL 100-0.45 UNIT/ML-% IJ SOLN
1650.0000 [IU]/h | INTRAMUSCULAR | Status: DC
  Administered 2014-05-25: 1000 [IU]/h via INTRAVENOUS
  Administered 2014-05-26 (×2): 1650 [IU]/h via INTRAVENOUS
  Filled 2014-05-25 (×6): qty 250

## 2014-05-25 MED ORDER — FUROSEMIDE 40 MG PO TABS
40.0000 mg | ORAL_TABLET | Freq: Every day | ORAL | Status: DC
  Administered 2014-05-25 – 2014-05-28 (×4): 40 mg via ORAL
  Filled 2014-05-25 (×4): qty 1

## 2014-05-25 MED ORDER — ASPIRIN EC 81 MG PO TBEC
81.0000 mg | DELAYED_RELEASE_TABLET | Freq: Every day | ORAL | Status: DC
  Administered 2014-05-26 – 2014-05-28 (×3): 81 mg via ORAL
  Filled 2014-05-25 (×4): qty 1

## 2014-05-25 MED ORDER — INSULIN ASPART 100 UNIT/ML ~~LOC~~ SOLN
0.0000 [IU] | Freq: Three times a day (TID) | SUBCUTANEOUS | Status: DC
  Administered 2014-05-25 (×2): 8 [IU] via SUBCUTANEOUS
  Administered 2014-05-26: 5 [IU] via SUBCUTANEOUS

## 2014-05-25 MED ORDER — ISOSORBIDE MONONITRATE ER 30 MG PO TB24
30.0000 mg | ORAL_TABLET | Freq: Every day | ORAL | Status: DC
  Administered 2014-05-25: 30 mg via ORAL
  Filled 2014-05-25: qty 1

## 2014-05-25 MED ORDER — HYDROCHLOROTHIAZIDE 12.5 MG PO CAPS: 12.5000 mg | ORAL_CAPSULE | Freq: Every day | ORAL | Status: DC

## 2014-05-25 MED ORDER — ASPIRIN 300 MG RE SUPP
300.0000 mg | RECTAL | Status: AC
  Filled 2014-05-25: qty 1

## 2014-05-25 MED ORDER — ALPRAZOLAM 0.25 MG PO TABS: 0.2500 mg | ORAL_TABLET | Freq: Two times a day (BID) | ORAL | Status: DC | PRN

## 2014-05-25 MED ORDER — METOPROLOL TARTRATE 25 MG PO TABS
25.0000 mg | ORAL_TABLET | Freq: Four times a day (QID) | ORAL | Status: DC
  Administered 2014-05-25 – 2014-05-28 (×12): 25 mg via ORAL
  Filled 2014-05-25 (×17): qty 1

## 2014-05-25 MED ORDER — ISOSORBIDE MONONITRATE ER 60 MG PO TB24
60.0000 mg | ORAL_TABLET | Freq: Every day | ORAL | Status: DC
  Filled 2014-05-25: qty 1

## 2014-05-25 MED ORDER — ACETAMINOPHEN 325 MG PO TABS
650.0000 mg | ORAL_TABLET | ORAL | Status: DC | PRN
  Administered 2014-05-27 – 2014-05-28 (×2): 650 mg via ORAL
  Filled 2014-05-25 (×2): qty 2

## 2014-05-25 MED ORDER — ROSUVASTATIN CALCIUM 5 MG PO TABS
5.0000 mg | ORAL_TABLET | Freq: Every day | ORAL | Status: DC
  Administered 2014-05-25 – 2014-05-27 (×3): 5 mg via ORAL
  Filled 2014-05-25 (×4): qty 1

## 2014-05-25 MED ORDER — DOCUSATE SODIUM 100 MG PO CAPS
100.0000 mg | ORAL_CAPSULE | Freq: Every day | ORAL | Status: DC | PRN
  Administered 2014-05-26 – 2014-05-27 (×2): 100 mg via ORAL
  Filled 2014-05-25 (×4): qty 1

## 2014-05-25 MED ORDER — ISOSORBIDE MONONITRATE ER 30 MG PO TB24
30.0000 mg | ORAL_TABLET | Freq: Once | ORAL | Status: AC
  Administered 2014-05-25: 30 mg via ORAL
  Filled 2014-05-25: qty 1

## 2014-05-25 MED ORDER — ONDANSETRON HCL 4 MG/2ML IJ SOLN: 4.0000 mg | Freq: Four times a day (QID) | INTRAMUSCULAR | Status: DC | PRN

## 2014-05-25 MED ORDER — DILTIAZEM HCL ER COATED BEADS 240 MG PO CP24
240.0000 mg | ORAL_CAPSULE | Freq: Every day | ORAL | Status: DC
  Administered 2014-05-25 – 2014-05-28 (×4): 240 mg via ORAL
  Filled 2014-05-25 (×4): qty 1

## 2014-05-25 MED ORDER — INSULIN GLARGINE 100 UNIT/ML ~~LOC~~ SOLN
80.0000 [IU] | Freq: Every day | SUBCUTANEOUS | Status: DC
  Administered 2014-05-26: 80 [IU] via SUBCUTANEOUS
  Filled 2014-05-25 (×2): qty 0.8

## 2014-05-25 MED ORDER — ROSUVASTATIN CALCIUM 20 MG PO TABS: 20.0000 mg | ORAL_TABLET | Freq: Every day | ORAL | Status: DC

## 2014-05-25 MED ORDER — TAMSULOSIN HCL 0.4 MG PO CAPS
0.4000 mg | ORAL_CAPSULE | Freq: Every day | ORAL | Status: DC
  Administered 2014-05-25 – 2014-05-28 (×4): 0.4 mg via ORAL
  Filled 2014-05-25 (×4): qty 1

## 2014-05-25 MED ORDER — CLOPIDOGREL BISULFATE 75 MG PO TABS
75.0000 mg | ORAL_TABLET | Freq: Every day | ORAL | Status: DC
  Administered 2014-05-25 – 2014-05-28 (×4): 75 mg via ORAL
  Filled 2014-05-25 (×5): qty 1

## 2014-05-25 MED FILL — Heparin Sodium (Porcine) 100 Unt/ML in Sodium Chloride 0.45%: INTRAMUSCULAR | Qty: 250 | Status: AC

## 2014-05-25 NOTE — Progress Notes (Signed)
ANTICOAGULATION CONSULT NOTE - Initial Consult  Pharmacy Consult for heparin Indication: STEMI  Allergies  Allergen Reactions  . Zocor [Simvastatin] Other (See Comments)    fatigue  . Lipitor [Atorvastatin]     Patient Measurements: Height: 5\' 7"  (170.2 cm) Weight: 278 lb 8 oz (126.327 kg) IBW/kg (Calculated) : 66.1 Heparin Dosing Weight: 100kg  Vital Signs: Temp: 97.5 F (36.4 C) (02/12 0300) Temp Source: Oral (02/12 0300) BP: 153/63 mmHg (02/12 0300) Pulse Rate: 82 (02/12 0300)   Medical History: Past Medical History  Diagnosis Date  . Coronary atherosclerosis of native coronary artery     a. CABG x 4 in 1994. Previous stent placement to SVG to distal RCA;  b. DES to native Cx in 2006;  c. DES SVG to RCA 05/08/11; d. Inf MI/Cath/PCI: LM 25, LAD 100, LCX patent stent, 50 into OM, RI 60-70, RCA 100, LIMA->LAD nl, VG->Diag 100 old, VG->RCA/PL 99 (DES x 1), EF 50-55%.  . Sleep apnea   . Essential hypertension, benign   . Depression     With anxious features/hx panic attacks  . Morbid obesity   . Hyperlipidemia     Not on statin 2/2 hx of intolerance.   . Nephrolithiasis   . Allergic rhinitis   . Type 2 diabetes mellitus   . ST elevation myocardial infarction (STEMI) of inferior wall     4/14 - DES SVG to PDA    Medications:  Prescriptions prior to admission  Medication Sig Dispense Refill Last Dose  . alprazolam (XANAX) 2 MG tablet Take 1/3 tablet by mouth every 2 hours as needed through out the night   Taking  . clopidogrel (PLAVIX) 75 MG tablet Take 1 tablet (75 mg total) by mouth daily with breakfast. 30 tablet 6   . cyclobenzaprine (FLEXERIL) 10 MG tablet Take 10 mg by mouth 3 (three) times daily as needed for muscle spasms.   Taking  . diltiazem (DILACOR XR) 240 MG 24 hr capsule Take 240 mg by mouth daily.   Taking  . furosemide (LASIX) 40 MG tablet take 1 tablet once daily 90 tablet 3   . glipiZIDE (GLUCOTROL) 10 MG tablet Take 10 mg by mouth 2 (two) times  daily before a meal.   Taking  . insulin glargine (LANTUS) 100 UNIT/ML injection Inject 100 Units into the skin every morning.   Taking  . isosorbide mononitrate (IMDUR) 30 MG 24 hr tablet Take 1 tablet (30 mg total) by mouth daily. 90 tablet 3   . ketoconazole (NIZORAL) 2 % shampoo Apply 1 application topically as needed.    Taking  . lisinopril-hydrochlorothiazide (PRINZIDE,ZESTORETIC) 20-12.5 MG per tablet Take 2 tablets by mouth daily.   Taking  . nitroGLYCERIN (NITROLINGUAL) 0.4 MG/SPRAY spray Place 1 spray under the tongue every 5 (five) minutes as needed. For chest pain   Taking  . oxyCODONE-acetaminophen (PERCOCET/ROXICET) 5-325 MG per tablet Take 1 tablet by mouth as needed.    Taking  . polyethylene glycol powder (GLYCOLAX/MIRALAX) powder Take 17 g by mouth as needed.    Taking  . tamsulosin (FLOMAX) 0.4 MG CAPS capsule Take 0.4 mg by mouth daily after supper.    Taking   Scheduled:  . aspirin  324 mg Oral NOW   Or  . aspirin  300 mg Rectal NOW  . [START ON 05/26/2014] aspirin EC  81 mg Oral Daily  . clopidogrel  75 mg Oral Q breakfast  . diltiazem  240 mg Oral Daily  . furosemide  40 mg Oral Daily  . insulin glargine  100 Units Subcutaneous Daily  . isosorbide mononitrate  30 mg Oral Daily  . metoprolol tartrate  25 mg Oral Q6H  . tamsulosin  0.4 mg Oral Daily   Infusions:  . heparin      Assessment: 81yo male tx'd from [facility] for further eval of NSTEMI to continue heparin.  Goal of Therapy:  Heparin level 0.3-0.7 units/ml Monitor platelets by anticoagulation protocol: Yes   Plan:  Started on heparin 1000 units/hr; will continue for now and monitor heparin levels and CBC.  Wynona Neat, PharmD, BCPS  05/25/2014,5:00 AM

## 2014-05-25 NOTE — Progress Notes (Signed)
Patient ID: [name], male   DOB: 11-18-1933, 81 y.o.   MRN: [id number]   SUBJECTIVE: No chest pain this morning.  Troponin up to 0.5.  Says his breathing is ok.   Scheduled Meds: . aspirin  324 mg Oral NOW   Or  . aspirin  300 mg Rectal NOW  . [START ON 05/26/2014] aspirin EC  81 mg Oral Daily  . clopidogrel  75 mg Oral Q breakfast  . diltiazem  240 mg Oral Daily  . furosemide  40 mg Oral Daily  . insulin glargine  100 Units Subcutaneous Daily  . isosorbide mononitrate  60 mg Oral Daily  . metoprolol tartrate  25 mg Oral Q6H  . rosuvastatin  20 mg Oral q1800  . tamsulosin  0.4 mg Oral Daily   Continuous Infusions: . heparin 1,350 Units/hr (05/25/14 0947)   PRN Meds:.acetaminophen, ALPRAZolam, nitroGLYCERIN, ondansetron (ZOFRAN) IV    Filed Vitals:   05/25/14 0600 05/25/14 0700 05/25/14 0753 05/25/14 0800  BP: 137/55 122/51  139/56  Pulse: 70 64  71  Temp:   98.6 F (37 C)   TempSrc:   Oral   Resp: 15 16  17   Height:      Weight:      SpO2: 91% 92%  93%    Intake/Output Summary (Last 24 hours) at 05/25/14 1017 Last data filed at 05/25/14 0900  Gross per 24 hour  Intake    130 ml  Output    475 ml  Net   -345 ml    LABS: Basic Metabolic Panel:  Recent Labs  05/25/14 0547  NA 135  K 5.2*  CL 100  CO2 26  GLUCOSE 351*  BUN 52*  CREATININE 2.33*  CALCIUM 8.9   Liver Function Tests: No results for input(s): AST, ALT, ALKPHOS, BILITOT, PROT, ALBUMIN in the last 72 hours. No results for input(s): LIPASE, AMYLASE in the last 72 hours. CBC:  Recent Labs  05/25/14 0547  WBC 6.2  NEUTROABS 5.1  HGB 13.1  HCT 40.3  MCV 89.4  PLT 184   Cardiac Enzymes:  Recent Labs  05/25/14 0547  TROPONINI 0.50*   BNP: Invalid input(s): POCBNP D-Dimer: No results for input(s): DDIMER in the last 72 hours. Hemoglobin A1C: No results for input(s): HGBA1C in the last 72 hours. Fasting Lipid Panel:  Recent Labs  05/25/14 0549  CHOL 185  HDL 44    LDLCALC 123*  TRIG 92  CHOLHDL 4.2   Thyroid Function Tests: No results for input(s): TSH, T4TOTAL, T3FREE, THYROIDAB in the last 72 hours.  Invalid input(s): FREET3 Anemia Panel: No results for input(s): VITAMINB12, FOLATE, FERRITIN, TIBC, IRON, RETICCTPCT in the last 72 hours.  RADIOLOGY: No results found.  PHYSICAL EXAM General: NAD, obese Neck: Thick, JVP 7-8, no thyromegaly or thyroid nodule.  Lungs: Clear to auscultation bilaterally with normal respiratory effort. CV: Nondisplaced PMI.  Heart regular S1/S2, no S3/S4, no murmur.  1+ ankle edema.   Abdomen: Soft, nontender, no hepatosplenomegaly, no distention.  Neurologic: Alert and oriented x 3.  Psych: Normal affect. Extremities: No clubbing or cyanosis.   TELEMETRY: Reviewed telemetry pt in NSR  ASSESSMENT AND PLAN: 81 yo with history of CAD s/p CABG and multiple PCIs post-CABG, CKD, and ?diastolic CHF presented with chest pain, found to have NSTEMI.  1. CAD: NSTEMI, troponin 0.13 and [place] to 0.5 here.  His chest pain resolved at [facility] and has not recurred.  Currently, CP-free.  He had CABG in  1994 and has had several PCIs since then.  Last was DES to [place] in 4/14.  He has been on Plavix.  ECG shows old inferior MI. His creatinine is 2.33.  It was normal 1 year ago but apparently has been sent to a nephrologist by his PCP in the interim.   - Given small rise in troponin so far and no further chest pain, I will not send him urgently to cardiac cath today.  I will cycle his enzymes to peak.  If he has no further chest pain, Lexiscan Cardiolite can be done on Sunday to risk stratify, and if not high risk ischemic findings, would manage medically. If he has intractable recurrent pain, marked rise in troponin, or high risk Cardiolite, would consider cardiac cath.  However, there will be a significant risk of developing AKI on CKD stage IV.   - Continue ASA, Plavix. Not on statin at home, has had myalgias with simvastatin  and atorvastatin.  I will try him on Crestor 5 mg daily.  - Continue metoprolol and diltiazem, increase Imdur to 60 mg daily.  2. CHF: ?Diastolic CHF.  He is on Lasix at home.  No recent echo.  Exam is difficult for volume.  - I will continue home Lasix for now.  - Will get echocardiogram.  3. OSA: CPAP at night.  4. CKD: Stage IV.  Last creatinine in our system a year ago was normal but in the interim, he has been seeing a nephrologist.  Not sure of etiology, ?diabetic nephropathy.  We have stopped lisinopril/HCTZ for now.  Follow creatinine closely, if he ends up requiring cath, risk of worsening renal function will be considerable.   Loralie Champagne 05/25/2014 10:27 AM

## 2014-05-25 NOTE — Progress Notes (Signed)
ANTICOAGULATION CONSULT NOTE - Follow Up Consult  Pharmacy Consult for heparin Indication: NSTEMI  Allergies  Allergen Reactions  . Zocor [Simvastatin] Other (See Comments)    fatigue  . Lipitor [Atorvastatin]     Patient Measurements: Height: 5\' 7"  (170.2 cm) Weight: 278 lb 8 oz (126.327 kg) IBW/kg (Calculated) : 66.1 Heparin Dosing Weight: 96 kg  Vital Signs: Temp: 98.6 F (37 C) (02/12 0753) Temp Source: Oral (02/12 0753) BP: 139/56 mmHg (02/12 0800) Pulse Rate: 71 (02/12 0800)  Labs:  Recent Labs  05/25/14 0547 05/25/14 0806  HGB 13.1  --   HCT 40.3  --   PLT 184  --   APTT 50*  --   HEPARINUNFRC  --  0.12*  CREATININE 2.33*  --   TROPONINI 0.50*  --     Estimated Creatinine Clearance: 32.8 mL/min (by C-G formula based on Cr of 2.33).   Medications:  Infusions:  . heparin 1,000 Units/hr (05/25/14 0700)    Assessment: 81 y/o male with hx CAD who presented to [facility] ED with chest pain that radiated to L arm. Troponin was elevated and he was transferred to [facility] for further management of NSTEMI.   Heparin level is SUBtherapeutic at 0.12 on 1000 units/hr. No bleeding noted, CBC is normal. Possible cath today.  Goal of Therapy:  Heparin level 0.3-0.7 units/ml Monitor platelets by anticoagulation protocol: Yes   Plan:  - Heparin 3000 unit IV bolus then increase rate to 1350 units/hr - 8 hr heparin level - Daily heparin level and CBC - Monitor for s/sx of bleeding  [facility], [place].D., BCPS Clinical Pharmacist Pager: [phone] 05/25/2014 9:07 AM

## 2014-05-25 NOTE — Care Management Note (Addendum)
    Page 1 of 1   05/28/2014     11:58:53 AM CARE MANAGEMENT NOTE 05/28/2014  Patient:  [name], [name]   Account Number:  [id number]  Date Initiated:  05/25/2014  Documentation initiated by:  Elissa Hefty  Subjective/Objective Assessment:   adm w nstemi     Action/Plan:   lives w wife, pcp dr Eddie Dibbles sasser   Anticipated DC Date:  05/28/2014   Anticipated DC Plan:  [place]  CM consult      [place]   Choice offered to / List presented to:  C-1 Patient        [place] arranged  [place] [facility]      [place].   Status of service:   Medicare Important Message given?  YES (If response is "NO", the following Medicare IM given date fields will be blank) Date Medicare IM given:  05/28/2014 Medicare IM given by:  Elissa Hefty Date Additional Medicare IM given:   Additional Medicare IM given by:    Discharge Disposition:  [place]  Per UR Regulation:  Reviewed for med. necessity/level of care/duration of stay  If discussed at [place] of Stay Meetings, dates discussed:    Comments:  2/15 1157a debbie Lechelle Wrigley rn,bsn spoke w pt and went over list of hhc agencies. he has used [facility] in past and would like them [facility] for hhc. ref to debbie taylor w [facility] for hhpt.

## 2014-05-25 NOTE — Progress Notes (Signed)
Inpatient Diabetes Program Recommendations  AACE/ADA: New Consensus Statement on Inpatient Glycemic Control (2013)  Target Ranges:  Prepandial:   less than 140 mg/dL      Peak postprandial:   less than 180 mg/dL (1-2 hours)      Critically ill patients:  140 - 180 mg/dL   Results for GAZA, LOREDO (MRN [id number]) as of 05/25/2014 09:05  Ref. Range 05/25/2014 05:47  Glucose Latest Range: 70-99 mg/dL 351 (H)   Diabetes history: DM2 Outpatient Diabetes medications: Lantus 100 units QAM, Glipizide 10 mg BID Current orders for Inpatient glycemic control: Lantus 100 units daily  Inpatient Diabetes Program Recommendations Insulin - Basal: If patient will be kept NPO, may want to consider decreasing Lantus by at least 20%. Correction (SSI): Please order CBGs with Novolog moderate correction scale Q4H while NPO and change to ACHS when diet is resumed. HgbA1C: Please order an A1C to evaluate glycemic control over the past 2-3 months.  Thanks, Barnie Alderman, RN, MSN, CCRN, CDE Diabetes Coordinator Inpatient Diabetes Program [phone] (Team Pager) [phone] ([facility] office) [phone] [facility] office)

## 2014-05-25 NOTE — H&P (Addendum)
RFA: NSTEMI  HPI: 81yoM with hx of HTN, HLD, IDDM, CAD s/p 4V CABG in 1994, multiple stents since, who developed sudden on set L sided chest discomfort that radiated to his L arm.    This pain worsened with activity and persisted despite 4 SL NTG, leading him to present to an OSH emergency room.  There he was treated with ASA and given additional SL NTG with improvement of his pain.  His troponin ruled in 0.04--> 0.13, and he was bolused heparin, plavix, asa, and given additional SL NTG with resolution of his pain.  He was transferred to [facility] for further management of his NSTEMI  Of note, he arrived on 3L O2.  He states that he wears home O2 at night, likely CPAP.  He was recently treated for gout with intraarticular steriods.  Review of Systems:     Cardiac Review of Systems: {Y] = yes [ ]  = no  Chest Pain [  x  ]  Resting SOB [x   ] Exertional SOB  [x  ]  Orthopnea [  ]   Pedal Edema [   ]    Palpitations [  ] Syncope  [  ]   Presyncope [   ]  General Review of Systems: [Y] = yes [  ]=no Constitional: recent weight change [  ]; anorexia [  ]; fatigue [  ]; nausea [  ]; night sweats [  ]; fever [  ]; or chills [  ];                                                                     Dental: poor dentition[  ];   Eye : blurred vision [  ]; diplopia [   ]; vision changes [  ];  Amaurosis fugax[  ]; Resp: cough [  ];  wheezing[  ];  hemoptysis[  ]; shortness of breath[  ]; paroxysmal nocturnal dyspnea[  ]; dyspnea on exertion[  ]; or orthopnea[  ];  GI:  gallstones[  ], vomiting[  ];  dysphagia[  ]; melena[  ];  hematochezia [  ]; heartburn[  ];   GU: kidney stones [  ]; hematuria[  ];   dysuria [  ];  nocturia[  ];               Skin: rash [  ], swelling[  ];, hair loss[  ];  peripheral edema[  ];  or itching[  ]; Musculosketetal: myalgias[  ];  joint swelling[x  ];  joint erythema[x  ]; [ x] gout  joint pain[  ];  back pain[  ];  Heme/Lymph: bruising[  ];  bleeding[  ];   anemia[  ];  Neuro: TIA[  ];  headaches[  ];  stroke[  ];  vertigo[  ];  seizures[  ];   paresthesias[  ];  difficulty walking[  ];  Psych:depression[  ]; anxiety[  ];  Endocrine: diabetes[  ];  thyroid dysfunction[  ];  Other:  Past Medical History  Diagnosis Date  . Coronary atherosclerosis of native coronary artery     a. CABG x 4 in 1994. Previous stent placement to SVG to distal RCA;  b. DES to native Cx in  2006;  c. DES SVG to RCA 05/08/11; d. Inf MI/Cath/PCI: LM 25, LAD 100, LCX patent stent, 50 into OM, RI 60-70, RCA 100, LIMA->LAD nl, VG->Diag 100 old, VG->RCA/PL 99 (DES x 1), EF 50-55%.  . Sleep apnea   . Essential hypertension, benign   . Depression     With anxious features/hx panic attacks  . Morbid obesity   . Hyperlipidemia     Not on statin 2/2 hx of intolerance.   . Nephrolithiasis   . Allergic rhinitis   . Type 2 diabetes mellitus   . ST elevation myocardial infarction (STEMI) of inferior wall     4/14 - DES SVG to PDA    @[facility] @   Allergies  Allergen Reactions  . Zocor [Simvastatin] Other (See Comments)    fatigue  . Lipitor [Atorvastatin]     History   Social History  . Marital Status: Married    Spouse Name: N/A  . Number of Children: N/A  . Years of Education: N/A   Occupational History  . Not on file.   Social History Main Topics  . Smoking status: Never Smoker   . Smokeless tobacco: Never Used  . Alcohol Use: No  . Drug Use: No  . Sexual Activity: No   Other Topics Concern  . Not on file   Social History Narrative    Family History  Problem Relation Age of Onset  . Heart attack Mother     Age 20    PHYSICAL EXAM: Filed Vitals:   05/25/14 0300  BP: 153/63  Pulse: 82  Temp: 97.5 F (36.4 C)  Resp: 15   General:  Well appearing. No respiratory difficulty HEENT: normal Neck: supple. mild JVD. Carotids 2+ bilat; no bruits. No lymphadenopathy or thryomegaly appreciated. Cor: PMI nondisplaced. Regular rate & rhythm. No rubs.  +s4 Lungs: soft rales at the bases Abdomen: soft, nontender, nondistended. No hepatosplenomegaly. No bruits or masses. Good bowel sounds. Extremities: no cyanosis, clubbing, rash, edema Neuro: alert & oriented x 3, cranial nerves grossly intact. moves all 4 extremities w/o difficulty. Affect pleasant.  ECG: OSH: Sinus tach, inferior q waves, LPFB, no ST elevations, PVCs  No results found for this or any previous visit (from the past 24 hour(s)). No results found.   ASSESSMENT: 81yoM with hx of multiple cardiac risk factors, known CAD, renal insufficiency, admitted with NSTEMI, currently chest pain free off NTG.  PLAN/DISCUSSION: 1. IV heparin 2. S/p ASA and Plavix load, c/w 81 and 75 mg daily 3. Metoprolol 4. Hold ACE given renal insufficiency and potential cath in AM 5. C/w homelasix given O2 requirement, mild JVP elevation and bibasilar rales 6. If CP recurrs, NTG prn 7. NPO  8. C/w home antihypertensives and xanax 9. Check cbc, bmp, ptt, troponin, and ECG now  Per my conversation with him and his goals of care, he would like to be DNAR  Terressa Koyanagi, MD Cardiology [facility]llow

## 2014-05-25 NOTE — Progress Notes (Signed)
Placed pt on nasal CPAP at this time, pt tolerating well

## 2014-05-25 NOTE — Progress Notes (Addendum)
Pt had episode of chest pain.  Rates pain 5/10 with radiation down left arm.  EKG done and sublingual nitro given.  Chest pain resolved.  Blood pressure is 87/29, MD notified.  Will hold 17:00 dose of metoprolol.  Will continue to monitor.

## 2014-05-25 NOTE — Progress Notes (Signed)
ANTICOAGULATION CONSULT NOTE - Follow Up Consult  Pharmacy Consult for heparin Indication: NSTEMI  Allergies  Allergen Reactions  . Zocor [Simvastatin] Other (See Comments)    fatigue  . Lipitor [Atorvastatin] Other (See Comments)    Muscle cramping    Patient Measurements: Height: 5\' 7"  (170.2 cm) Weight: 278 lb 8 oz (126.327 kg) IBW/kg (Calculated) : 66.1 Heparin Dosing Weight: 96 kg  Vital Signs: Temp: 98.5 F (36.9 C) (02/12 1600) Temp Source: Oral (02/12 1600) BP: 119/35 mmHg (02/12 1700) Pulse Rate: 71 (02/12 1700)  Labs:  Recent Labs  05/25/14 0547 05/25/14 0806 05/25/14 1114 05/25/14 1723  HGB 13.1  --  12.8*  --   HCT 40.3  --  39.4  --   PLT 184  --  184  --   APTT 50*  --   --   --   HEPARINUNFRC  --  0.12*  --  0.12*  CREATININE 2.33*  --   --   --   TROPONINI 0.50*  --  0.59*  --     Estimated Creatinine Clearance: 32.8 mL/min (by C-G formula based on Cr of 2.33).   Medications:  Infusions:  . heparin 1,350 Units/hr (05/25/14 0947)    Assessment: 81 y/o male with hx CAD who presented to [facility] ED with chest pain that radiated to L arm. Troponin was elevated and he was transferred to [facility] for further management of NSTEMI.   Heparin level remains unchanged despite rate increase.  Noted episode of CP this PM, relieved with SL NTG.  No bleeding noted, CBC is normal. Possible cath today.  Goal of Therapy:  Heparin level 0.3-0.7 units/ml Monitor platelets by anticoagulation protocol: Yes   Plan:  Increase heparin to 1650 units/hr 8 hr heparin level Daily heparin level and CBC Monitor for s/sx of bleeding  [employer], Pharm.D., BCPS Clinical Pharmacist Pager [phone] 05/25/2014 6:51 PM

## 2014-05-26 ENCOUNTER — Inpatient Hospital Stay (HOSPITAL_COMMUNITY): Payer: Medicare Other

## 2014-05-26 DIAGNOSIS — R072 Precordial pain: Secondary | ICD-10-CM

## 2014-05-26 LAB — BASIC METABOLIC PANEL
Anion gap: 9 (ref 5–15)
BUN: 68 mg/dL — AB (ref 6–23)
CALCIUM: 7.9 mg/dL — AB (ref 8.4–10.5)
CO2: 26 mmol/L (ref 19–32)
CREATININE: 2.6 mg/dL — AB (ref 0.50–1.35)
Chloride: 96 mmol/L (ref 96–112)
GFR calc non Af Amer: 22 mL/min — ABNORMAL LOW (ref >90)
GFR, EST AFRICAN AMERICAN: 25 mL/min — AB (ref >90)
Glucose, Bld: 305 mg/dL — ABNORMAL HIGH (ref 70–99)
Potassium: 4.8 mmol/L (ref 3.5–5.1)
Sodium: 131 mmol/L — ABNORMAL LOW (ref 135–145)

## 2014-05-26 LAB — CBC
HEMATOCRIT: 35.7 % — AB (ref 39.0–52.0)
Hemoglobin: 11.4 g/dL — ABNORMAL LOW (ref 13.0–17.0)
MCH: 28 pg (ref 26.0–34.0)
MCHC: 31.9 g/dL (ref 30.0–36.0)
MCV: 87.7 fL (ref 78.0–100.0)
PLATELETS: 198 10*3/uL (ref 150–400)
RBC: 4.07 MIL/uL — ABNORMAL LOW (ref 4.22–5.81)
RDW: 13.4 % (ref 11.5–15.5)
WBC: 9.4 10*3/uL (ref 4.0–10.5)

## 2014-05-26 LAB — GLUCOSE, CAPILLARY
GLUCOSE-CAPILLARY: 234 mg/dL — AB (ref 70–99)
Glucose-Capillary: 211 mg/dL — ABNORMAL HIGH (ref 70–99)

## 2014-05-26 LAB — HEPARIN LEVEL (UNFRACTIONATED)
Heparin Unfractionated: 0.34 IU/mL (ref 0.30–0.70)
Heparin Unfractionated: 0.44 IU/mL (ref 0.30–0.70)

## 2014-05-26 LAB — BRAIN NATRIURETIC PEPTIDE: B NATRIURETIC PEPTIDE 5: 138.8 pg/mL — AB (ref 0.0–100.0)

## 2014-05-26 MED ORDER — REGADENOSON 0.4 MG/5ML IV SOLN
0.4000 mg | Freq: Once | INTRAVENOUS | Status: DC
  Filled 2014-05-26: qty 5

## 2014-05-26 MED ORDER — INSULIN ASPART 100 UNIT/ML ~~LOC~~ SOLN
0.0000 [IU] | Freq: Every day | SUBCUTANEOUS | Status: DC
Start: 2014-05-26 — End: 2014-05-28
  Administered 2014-05-27: 2 [IU] via SUBCUTANEOUS

## 2014-05-26 MED ORDER — INSULIN ASPART 100 UNIT/ML ~~LOC~~ SOLN
0.0000 [IU] | Freq: Three times a day (TID) | SUBCUTANEOUS | Status: DC
  Administered 2014-05-26 – 2014-05-27 (×3): 7 [IU] via SUBCUTANEOUS
  Administered 2014-05-27: 4 [IU] via SUBCUTANEOUS
  Administered 2014-05-28: 3 [IU] via SUBCUTANEOUS
  Administered 2014-05-28: 7 [IU] via SUBCUTANEOUS

## 2014-05-26 MED ORDER — INSULIN GLARGINE 100 UNIT/ML ~~LOC~~ SOLN
90.0000 [IU] | Freq: Every day | SUBCUTANEOUS | Status: DC
  Administered 2014-05-27 – 2014-05-28 (×2): 90 [IU] via SUBCUTANEOUS
  Filled 2014-05-26 (×3): qty 0.9

## 2014-05-26 MED ORDER — TECHNETIUM TC 99M SESTAMIBI GENERIC - CARDIOLITE
30.0000 | Freq: Once | INTRAVENOUS | Status: AC | PRN
  Administered 2014-05-26: 30 via INTRAVENOUS

## 2014-05-26 NOTE — Progress Notes (Signed)
Progress Note  Subjective:    Doing well this AM, no chest pain. Does report some mild chest pain yesterday evening, subsided w/ NTG. Breathing same as yesterday, reports DOE. SpO2 94% on 4L.   Objective:   Temp:  [98 F (36.7 C)-98.5 F (36.9 C)] 98 F (36.7 C) (02/13 0800) Pulse Rate:  [57-71] 67 (02/13 0800) Resp:  [14-25] 16 (02/13 0800) BP: (83-155)/(25-50) 129/25 mmHg (02/13 0800) SpO2:  [91 %-94 %] 94 % (02/13 0800) Last BM Date: 05/23/14  Filed Weights   05/25/14 0300  Weight: 278 lb 8 oz (126.327 kg)    Intake/Output Summary (Last 24 hours) at 05/26/14 1034 Last data filed at 05/26/14 0900  Gross per 24 hour  Intake 1383.35 ml  Output   1000 ml  Net 383.35 ml    Telemetry: NSR  Physical Exam: General: Elderly white male, alert, cooperative, NAD. HEENT: PERRL, EOMI. Moist mucus membranes Neck: Full range of motion without pain, supple, no lymphadenopathy or carotid bruits. JVP difficult to assess.  Lungs: Clear to ascultation bilaterally, normal work of respiration, no wheezes, rales, rhonchi Heart: RRR, no murmurs, gallops, or rubs Abdomen: Soft, non-tender, non-distended, BS + Extremities: No cyanosis or clubbing. +2 pitting edema at ankles.  Neurologic: Alert & oriented x3, cranial nerves II-XII intact, strength grossly intact, sensation intact to light touch   Lab Results:  Basic Metabolic Panel:  Recent Labs Lab 05/25/14 0547 05/26/14 0234  NA 135 131*  K 5.2* 4.8  CL 100 96  CO2 26 26  GLUCOSE 351* 305*  BUN 52* 68*  CREATININE 2.33* 2.60*  CALCIUM 8.9 7.9*    CBC:  Recent Labs Lab 05/25/14 0547 05/25/14 1114 05/26/14 0234  WBC 6.2 6.5 9.4  HGB 13.1 12.8* 11.4*  HCT 40.3 39.4 35.7*  MCV 89.4 89.1 87.7  PLT 184 184 198    Cardiac Enzymes:  Recent Labs Lab 05/25/14 0547 05/25/14 1114 05/25/14 1723  TROPONINI 0.50* 0.59* 0.56*      Medications:   Scheduled Medications: . aspirin EC  81 mg Oral Daily  .  clopidogrel  75 mg Oral Q breakfast  . diltiazem  240 mg Oral Daily  . furosemide  40 mg Oral Daily  . insulin aspart  0-15 Units Subcutaneous TID WC  . insulin glargine  80 Units Subcutaneous Daily  . isosorbide mononitrate  60 mg Oral Daily  . metoprolol tartrate  25 mg Oral Q6H  . rosuvastatin  5 mg Oral q1800  . tamsulosin  0.4 mg Oral Daily     Infusions: . heparin 1,650 Units/hr (05/26/14 0200)     PRN Medications:  acetaminophen, ALPRAZolam, docusate sodium, nitroGLYCERIN, ondansetron (ZOFRAN) IV   Assessment and Plan:  81 y/o M w/ PMHx of HTN, HLD, DM type II, COPD, GERD, anxiety, depression, and CAD s/p CABG x4 1994 and mulipple stents placed since that time including DES to native LCx 2006, DES to SVG-RCA 2013, and most recently DES to SVG-PDA, admitted for NSTEMI.   NSTEMI: Currently chest pain free. Repeat EKG only significant for old inferior infarct (Q waves in III, aVF). No acute ischemic changes. Troponin trend as follows:    Recent Labs Lab 05/25/14 0547 05/25/14 1114 05/25/14 1723  TROPONINI 0.50* 0.59* 0.56*  -Plan for [facility] tomorrow  -Continue ASA, Plavix -Cardizem CD 240 mg daily -Lopressor 25 mg q6h -Imdur 60 mg daily; increased from 30 yesterday -Crestor 5 mg daily (previous intolerance to zocor, lipitor) -NTG prn  ?  dCHF: On Lasix 40 mg daily at home. Urine output 1.3L over past 24 hours. No rales appreciated on pulmonary exam. Weight increased ~3 lbs today (278 lbs - 281 lbs)?  -ECHO pending -CXR today -Continue Lasix 40 mg po daily for now -HOLD off on ACEI in the setting of worsening renal function  ?Acute on CKD: Unclear baseline, most recent Cr prior to this admission 1.1-1.2. Has since been seeing a nephrologist per chart review, Dr. Joelyn Oms?. Per patient, had Renal US, showed one kidney smaller than the other, question renal artery stenosis. Hospital trend as follows:   Recent Labs Lab 05/25/14 0547 05/26/14 0234    CREATININE 2.33* 2.60*  -Continue to hold ACEI/HCTZ -Lasix as above -Follow w/ [facility] as outpatient  DM type II: No recent HbA1c. Takes Lantus 100 units qAM + Glipizide 10 mg bid at home. CBG's as follows:   Recent Labs Lab 05/25/14 0807 05/25/14 1104 05/25/14 1640  GLUCAP 313* 289* 281*  -Increase to ISS-R + HS coverage -Increase Lantus to 90 units qAM   Natasha Bence, MD PGY-2 Internal Medicine Pager: [phone]  Patient seen and examined with Dr. Ronnald Ramp. We discussed all aspects of the encounter. I agree with the assessment and plan as stated above.   Has had one fleeting episode of CP resolved quickly with NTG. Troponins flat. Given CXD will proceed with Myoview in am. Volume status looks ok. He says renal function has deteriorated significantly in past year. Follows with Dr. Joelyn Oms who apparently told him one kidney is smaller than the other. Will get renovascualr u/s to look for RAS - body habitus may limit images. Will keep in SDU one more day.   Dannelle Rhymes,MD 11:41 AM

## 2014-05-26 NOTE — Progress Notes (Signed)
ANTICOAGULATION CONSULT NOTE - Follow Up Consult  Pharmacy Consult for heparin Indication: NSTEMI  Labs:  Recent Labs  05/25/14 0547 05/25/14 0806 05/25/14 1114 05/25/14 1723 05/26/14 0234  HGB 13.1  --  12.8*  --  11.4*  HCT 40.3  --  39.4  --  35.7*  PLT 184  --  184  --  198  APTT 50*  --   --   --   --   HEPARINUNFRC  --  0.12*  --  0.12* 0.34  CREATININE 2.33*  --   --   --   --   TROPONINI 0.50*  --  0.59* 0.56*  --      Assessment/Plan:  81yo male therapeutic on heparin after rate increases. Will continue gtt at current rate and confirm stable with additional level.   Wynona Neat, PharmD, BCPS  05/26/2014,3:10 AM

## 2014-05-26 NOTE — Progress Notes (Signed)
Good relief from pain at Rt ankle( newly dx'd w /gout at this site) w/heat packs .

## 2014-05-26 NOTE — Progress Notes (Signed)
Dr aware of elevated CBG's . Order received for resistant SSI .

## 2014-05-26 NOTE — Progress Notes (Signed)
Since Pt recently returned from [place] ,CBG taken and late lunch tray was warmed for the Pt .SSI admin per current CBG .

## 2014-05-26 NOTE — Progress Notes (Signed)
ANTICOAGULATION CONSULT NOTE - Follow Up Consult  Pharmacy Consult for heparin Indication: NSTEMI  Allergies  Allergen Reactions  . Zocor [Simvastatin] Other (See Comments)    fatigue  . Lipitor [Atorvastatin] Other (See Comments)    Muscle cramping    Patient Measurements: Height: 5\' 7"  (170.2 cm) Weight: 278 lb 8 oz (126.327 kg) IBW/kg (Calculated) : 66.1 Heparin Dosing Weight: 96 kg  Vital Signs: Temp: 98 F (36.7 C) (02/13 0800) Temp Source: Oral (02/13 0800) BP: 129/25 mmHg (02/13 0800) Pulse Rate: 67 (02/13 0800)  Labs:  Recent Labs  05/25/14 0547  05/25/14 1114 05/25/14 1723 05/26/14 0234 05/26/14 0950  HGB 13.1  --  12.8*  --  11.4*  --   HCT 40.3  --  39.4  --  35.7*  --   PLT 184  --  184  --  198  --   APTT 50*  --   --   --   --   --   HEPARINUNFRC  --   < >  --  0.12* 0.34 0.44  CREATININE 2.33*  --   --   --  2.60*  --   TROPONINI 0.50*  --  0.59* 0.56*  --   --   < > = values in this interval not displayed.  Estimated Creatinine Clearance: 29.4 mL/min (by C-G formula based on Cr of 2.6).   Medications:  Infusions:  . heparin 1,650 Units/hr (05/26/14 0200)    Assessment: 81 y/o male with hx CAD who presented to [facility] ED with chest pain that radiated to L arm. Troponin was elevated and he was transferred to [facility] for further management of NSTEMI.   Heparin level is therapeutic at 0.44 on 1650 units/hr. No bleeding noted, CBC is stable. Plan is for [facility] tomorrow.  Goal of Therapy:  Heparin level 0.3-0.7 units/ml Monitor platelets by anticoagulation protocol: Yes   Plan:  Continue heparin drip at 1650 units/hr Daily heparin level and CBC Monitor for s/sx of bleeding  [facility], [place].D., BCPS Clinical Pharmacist Pager: [phone] 05/26/2014 11:03 AM

## 2014-05-27 ENCOUNTER — Inpatient Hospital Stay (HOSPITAL_COMMUNITY): Payer: Medicare Other

## 2014-05-27 DIAGNOSIS — R079 Chest pain, unspecified: Secondary | ICD-10-CM

## 2014-05-27 DIAGNOSIS — M1000 Idiopathic gout, unspecified site: Secondary | ICD-10-CM

## 2014-05-27 LAB — CBC
HEMATOCRIT: 38.5 % — AB (ref 39.0–52.0)
HEMOGLOBIN: 12.5 g/dL — AB (ref 13.0–17.0)
MCH: 28.9 pg (ref 26.0–34.0)
MCHC: 32.5 g/dL (ref 30.0–36.0)
MCV: 89.1 fL (ref 78.0–100.0)
PLATELETS: 190 10*3/uL (ref 150–400)
RBC: 4.32 MIL/uL (ref 4.22–5.81)
RDW: 13.5 % (ref 11.5–15.5)
WBC: 6.9 10*3/uL (ref 4.0–10.5)

## 2014-05-27 LAB — HEPARIN LEVEL (UNFRACTIONATED): Heparin Unfractionated: 0.34 IU/mL (ref 0.30–0.70)

## 2014-05-27 LAB — GLUCOSE, CAPILLARY
GLUCOSE-CAPILLARY: 190 mg/dL — AB (ref 70–99)
Glucose-Capillary: 186 mg/dL — ABNORMAL HIGH (ref 70–99)
Glucose-Capillary: 235 mg/dL — ABNORMAL HIGH (ref 70–99)
Glucose-Capillary: 214 mg/dL — ABNORMAL HIGH (ref 70–99)

## 2014-05-27 MED ORDER — TRAMADOL HCL 50 MG PO TABS
50.0000 mg | ORAL_TABLET | Freq: Once | ORAL | Status: AC
  Administered 2014-05-27: 50 mg via ORAL
  Filled 2014-05-27: qty 1

## 2014-05-27 MED ORDER — HEPARIN SODIUM (PORCINE) 5000 UNIT/ML IJ SOLN
5000.0000 [IU] | Freq: Three times a day (TID) | INTRAMUSCULAR | Status: DC
  Administered 2014-05-27 – 2014-05-28 (×2): 5000 [IU] via SUBCUTANEOUS
  Filled 2014-05-27 (×5): qty 1

## 2014-05-27 MED ORDER — REGADENOSON 0.4 MG/5ML IV SOLN
INTRAVENOUS | Status: AC
  Administered 2014-05-27: 0.4 mg via INTRAVENOUS
  Filled 2014-05-27: qty 5

## 2014-05-27 MED ORDER — COLCHICINE 0.6 MG PO TABS
0.6000 mg | ORAL_TABLET | Freq: Two times a day (BID) | ORAL | Status: DC
  Administered 2014-05-27 – 2014-05-28 (×3): 0.6 mg via ORAL
  Filled 2014-05-27 (×4): qty 1

## 2014-05-27 MED ORDER — ALLOPURINOL 100 MG PO TABS
100.0000 mg | ORAL_TABLET | Freq: Every day | ORAL | Status: DC
  Administered 2014-05-28: 100 mg via ORAL
  Filled 2014-05-27: qty 1

## 2014-05-27 MED ORDER — REGADENOSON 0.4 MG/5ML IV SOLN
0.4000 mg | Freq: Once | INTRAVENOUS | Status: AC
  Administered 2014-05-27: 0.4 mg via INTRAVENOUS
  Filled 2014-05-27: qty 5

## 2014-05-27 MED ORDER — TECHNETIUM TC 99M SESTAMIBI GENERIC - CARDIOLITE
30.0000 | Freq: Once | INTRAVENOUS | Status: AC | PRN
  Administered 2014-05-27: 30 via INTRAVENOUS

## 2014-05-27 NOTE — Progress Notes (Signed)
Patient: [name] / Admit Date: 05/25/2014 / Date of Encounter: 05/27/2014, 10:03 AM   Subjective: Denies CP or SOB. C/o gout pain in R ankle. Recently received steroid shot there.    Objective: Telemetry: NSR Physical Exam: Blood pressure 150/59, pulse 62, temperature 97.9 F (36.6 C), temperature source Oral, resp. rate 16, height 5\' 7"  (1.702 m), weight 281 lb 8 oz (127.688 kg), SpO2 95 %. General: Well developed, well nourished, in no acute distress. Laying flat in bed. Head: Normocephalic, atraumatic, sclera non-icteric, no xanthomas, nares are without discharge. Neck: JVP not elevated. Lungs: Clear bilaterally to auscultation without wheezes, rales, or rhonchi. Breathing is unlabored. Heart: RRR S1 S2 without murmurs, rubs, or gallops.  Abdomen: Soft, non-tender, non-distended with normoactive bowel sounds. No rebound/guarding. Extremities: No clubbing or cyanosis. Trace LE edema. Tender right ankle. Neuro: Alert and oriented X 3. Moves all extremities spontaneously. Psych:  Responds to questions appropriately with a normal affect.   Intake/Output Summary (Last 24 hours) at 05/27/14 1003 Last data filed at 05/27/14 0807  Gross per 24 hour  Intake    504 ml  Output   2000 ml  Net  -1496 ml    Inpatient Medications:  . aspirin EC  81 mg Oral Daily  . clopidogrel  75 mg Oral Q breakfast  . diltiazem  240 mg Oral Daily  . furosemide  40 mg Oral Daily  . insulin aspart  0-20 Units Subcutaneous TID WC  . insulin aspart  0-5 Units Subcutaneous QHS  . insulin glargine  90 Units Subcutaneous Daily  . isosorbide mononitrate  60 mg Oral Daily  . metoprolol tartrate  25 mg Oral Q6H  . regadenoson      . regadenoson  0.4 mg Intravenous Once  . rosuvastatin  5 mg Oral q1800  . tamsulosin  0.4 mg Oral Daily   Infusions:  . heparin 1,650 Units/hr (05/27/14 0000)    Labs:  Recent Labs  05/25/14 0547 05/26/14 0234  NA 135 131*  K 5.2* 4.8  CL 100 96  CO2 26 26    GLUCOSE 351* 305*  BUN 52* 68*  CREATININE 2.33* 2.60*  CALCIUM 8.9 7.9*   No results for input(s): AST, ALT, ALKPHOS, BILITOT, PROT, ALBUMIN in the last 72 hours.  Recent Labs  05/25/14 0547  05/26/14 0234 05/27/14 0325  WBC 6.2  < > 9.4 6.9  NEUTROABS 5.1  --   --   --   HGB 13.1  < > 11.4* 12.5*  HCT 40.3  < > 35.7* 38.5*  MCV 89.4  < > 87.7 89.1  PLT 184  < > 198 190  < > = values in this interval not displayed.  Recent Labs  05/25/14 0547 05/25/14 1114 05/25/14 1723  TROPONINI 0.50* 0.59* 0.56*   Invalid input(s): POCBNP No results for input(s): HGBA1C in the last 72 hours.   Radiology/Studies:  Dg Chest 1 View  05/26/2014   CLINICAL DATA:  Increase shortness of breath. Congestive heart failure. COPD.  EXAM: CHEST  1 VIEW  COMPARISON:  05/24/2014  FINDINGS: Cardiomegaly remains stable. Decreased lung volumes noted. Increased perihilar interstitial prominence noted, suspicious for mild perihilar edema. No focal area of consolidation seen. Prior CABG noted.  IMPRESSION: Decreased lung volumes. Increased perihilar interstitial prominence, suspicious for mild perihilar edema.  Stable cardiomegaly.   Electronically Signed   By: Earle Gell M.D.   On: 05/26/2014 15:52     Assessment and Plan  1. NSTEMI with  history of CAD s/p CABG 1994, multiple stents 2. ?Acute on chronic diastolic CHF - EF 123456, grade 1 DD 3. ?Acute on CKD stage IV 4. DM type 2 5. Morbid obesity  Body mass index is 44.08 kg/(m^2).  6. Likely OSA, on home O2 qhs 7. Likely pseudohyponatremia 8. Mild anemia 9. Gout  Pt seen  in nuc today. Await myoview results. Per notes renal function has deteriorated significantly in past year. Follows with Dr. Joelyn Oms who apparently told him one kidney is smaller than the other. Renovascular u/s pending to look for RAS - body habitus may limit images. No BMET for today - will order for tomorrow. Will defer to MD if we want to draw additional labs today. Per d/w  Dr. Haroldine Laws will add colchicine for gout and start allopurinol tomorrow.  Signed, Melina Copa PA-C  Patient seen and examined with Melina Copa, PA-C. We discussed all aspects of the encounter. I agree with the assessment and plan as stated above.   No further CP. Volume status looks ok. For Myoview today. Given CKD - only cath if results very high risk. Start colchicine for gout. Pending u/s for RAS.   Hopefully home in am  Quillian Quince Beryle Bagsby,MD 10:26 AM

## 2014-05-27 NOTE — Progress Notes (Addendum)
ANTICOAGULATION CONSULT NOTE - Follow Up Consult  Pharmacy Consult for heparin Indication: NSTEMI  Allergies  Allergen Reactions  . Zocor [Simvastatin] Other (See Comments)    fatigue  . Lipitor [Atorvastatin] Other (See Comments)    Muscle cramping    Patient Measurements: Height: 5\' 7"  (170.2 cm) Weight: 281 lb 8 oz (127.688 kg) IBW/kg (Calculated) : 66.1 Heparin Dosing Weight: 96 kg  Vital Signs: Temp: 97.9 F (36.6 C) (02/14 0717) Temp Source: Oral (02/14 0717) BP: 151/57 mmHg (02/14 0717) Pulse Rate: 62 (02/14 0717)  Labs:  Recent Labs  05/25/14 0547  05/25/14 1114 05/25/14 1723 05/26/14 0234 05/26/14 0950 05/27/14 0325  HGB 13.1  --  12.8*  --  11.4*  --  12.5*  HCT 40.3  --  39.4  --  35.7*  --  38.5*  PLT 184  --  184  --  198  --  190  APTT 50*  --   --   --   --   --   --   HEPARINUNFRC  --   < >  --  0.12* 0.34 0.44 0.34  CREATININE 2.33*  --   --   --  2.60*  --   --   TROPONINI 0.50*  --  0.59* 0.56*  --   --   --   < > = values in this interval not displayed.  Estimated Creatinine Clearance: 29.6 mL/min (by C-G formula based on Cr of 2.6).   Medications:  Infusions:  . heparin 1,650 Units/hr (05/27/14 0000)    Assessment: 81 y/o male with hx CAD who presented to [facility] ED with chest pain that radiated to L arm. Troponin was elevated and he was transferred to [facility] for further management of NSTEMI.   Heparin level is therapeutic at 0.34 on 1650 units/hr. No bleeding noted, CBC is stable. Plan is for [employer] today.  Goal of Therapy:  Heparin level 0.3-0.7 units/ml Monitor platelets by anticoagulation protocol: Yes   Plan:  Continue heparin drip at 1650 units/hr Daily heparin level and CBC Monitor for s/sx of bleeding  [facility], [place]arnell.D., BCPS Clinical Pharmacist Pager: [phone] 05/27/2014 8:58 AM   Addendum: D/c IV heparin and begin SQ heparin for VTE prophylaxis post myoview.  Heparin 5000 units SQ  q8h  [facility], Pharm.D., BCPS Clinical Pharmacist Pager: [phone] 05/27/2014 1:47 PM

## 2014-05-27 NOTE — Progress Notes (Signed)
Nuc reviewed: IMPRESSION: 1. No reversible ischemia or infarction. Fixed inferolateral attenuation or scar. 2. Mild global hypokinesis without focal wall motion abnormality. 3. Left ventricular ejection fraction 41% 4. Intermediate-risk stress test findings*.  EF was 60-65% by echo but poor quality. Given no ischemia, will continue medical therapy. Asked nursing to make patient aware. Dayna [name] PA-C

## 2014-05-28 DIAGNOSIS — R531 Weakness: Secondary | ICD-10-CM

## 2014-05-28 DIAGNOSIS — N183 Chronic kidney disease, stage 3 (moderate): Secondary | ICD-10-CM

## 2014-05-28 DIAGNOSIS — I2510 Atherosclerotic heart disease of native coronary artery without angina pectoris: Secondary | ICD-10-CM

## 2014-05-28 DIAGNOSIS — E663 Overweight: Secondary | ICD-10-CM

## 2014-05-28 LAB — GLUCOSE, CAPILLARY
Glucose-Capillary: 148 mg/dL — ABNORMAL HIGH (ref 70–99)
Glucose-Capillary: 210 mg/dL — ABNORMAL HIGH (ref 70–99)

## 2014-05-28 LAB — BASIC METABOLIC PANEL
ANION GAP: 9 (ref 5–15)
BUN: 60 mg/dL — AB (ref 6–23)
CALCIUM: 8.5 mg/dL (ref 8.4–10.5)
CO2: 24 mmol/L (ref 19–32)
CREATININE: 2 mg/dL — AB (ref 0.50–1.35)
Chloride: 102 mmol/L (ref 96–112)
GFR calc Af Amer: 35 mL/min — ABNORMAL LOW (ref >90)
GFR, EST NON AFRICAN AMERICAN: 30 mL/min — AB (ref >90)
GLUCOSE: 185 mg/dL — AB (ref 70–99)
POTASSIUM: 4.5 mmol/L (ref 3.5–5.1)
Sodium: 135 mmol/L (ref 135–145)

## 2014-05-28 LAB — CBC
HEMATOCRIT: 38.5 % — AB (ref 39.0–52.0)
Hemoglobin: 12.8 g/dL — ABNORMAL LOW (ref 13.0–17.0)
MCH: 29 pg (ref 26.0–34.0)
MCHC: 33.2 g/dL (ref 30.0–36.0)
MCV: 87.3 fL (ref 78.0–100.0)
PLATELETS: 183 10*3/uL (ref 150–400)
RBC: 4.41 MIL/uL (ref 4.22–5.81)
RDW: 13.8 % (ref 11.5–15.5)
WBC: 8 10*3/uL (ref 4.0–10.5)

## 2014-05-28 LAB — HEMOGLOBIN A1C
Hgb A1c MFr Bld: 8.6 % — ABNORMAL HIGH (ref 4.8–5.6)
Mean Plasma Glucose: 200 mg/dL

## 2014-05-28 LAB — HEPARIN LEVEL (UNFRACTIONATED): Heparin Unfractionated: <0.1 IU/mL — ABNORMAL LOW (ref 0.30–0.70)

## 2014-05-28 MED ORDER — ROSUVASTATIN CALCIUM 5 MG PO TABS: 5.0000 mg | ORAL_TABLET | Freq: Every day | ORAL | Status: DC

## 2014-05-28 MED ORDER — ALLOPURINOL 100 MG PO TABS: 100.0000 mg | ORAL_TABLET | Freq: Every day | ORAL | Status: DC

## 2014-05-28 MED ORDER — METOPROLOL TARTRATE 50 MG PO TABS: 25.0000 mg | ORAL_TABLET | Freq: Two times a day (BID) | ORAL | Status: DC

## 2014-05-28 MED ORDER — ISOSORBIDE MONONITRATE ER 60 MG PO TB24: 60.0000 mg | ORAL_TABLET | Freq: Every day | ORAL | Status: DC

## 2014-05-28 MED ORDER — COLCHICINE 0.6 MG PO TABS: 0.6000 mg | ORAL_TABLET | Freq: Two times a day (BID) | ORAL | Status: DC

## 2014-05-28 NOTE — Progress Notes (Signed)
Went over discharge instructions, new medications, and follow up appointments with patient and family at bedside. Pt had no additional questions or concerns related to discharge instructions. Case management to set up [facility] PT. Iv discontinued, patient taken off monitor. Pt discharged home.   Roxan Hockey, RN

## 2014-05-28 NOTE — Discharge Summary (Signed)
Physician Discharge Summary     Cardiologist:  [name]  Patient ID: [name] MRN: [id number] DOB/AGE: 08-28-1933 81 y.o.  Admit date: 05/25/2014 Discharge date: 05/28/2014  Admission Diagnoses: NSTEMI  Discharge Diagnoses:  Active Problems:   NSTEMI (non-ST elevated myocardial infarction)   Chronic diastolic HF   Acute on Chronic CKD   DM II   Gout   Discharged Condition: stable  Hospital Course:   81yoM with hx of HTN, HLD, IDDM, CAD s/p 4V CABG in 1994, multiple stents since, who developed sudden on set L sided chest discomfort that radiated to his L arm. This pain worsened with activity and persisted despite 4 SL NTG, leading him to present to an OSH emergency room.  There he was treated with ASA and given additional SL NTG with improvement of his pain.   His troponin ruled in 0.04--> 0.13, and he was bolused heparin, plavix, asa, and given additional SL NTG with resolution of his pain.  He was transferred to [facility] for further management of his NSTEMI.  Of note, he arrived on 3L O2. He states that he wears home O2 at night, likely CPAP.  He was recently treated for gout with intraarticular steriods.  Troponin peaked at 0.59.  The patient underwent Lexiscan cardiolite stress test revealing No reversible ischemia or infarction. Fixed inferolateral attenuation or scar.  Mild global hypokinesis without focal wall motion abnormality.  Left ventricular ejection fraction 41%.  He was continued on ASA, plavix, Imdur 60mg  and beta blocker.  Lopressor was changed from 25mg  Q6 to 50Q12 at discharge.  Statin for HLD.  Previous intolerance to zocor and lipitor.  Echocardiogram EF appeared to be 60-65%, however, study was of poor quality.  G1DD.  CXR Decreased lung volumes. Increased perihilar interstitial prominence, suspicious for mild perihilar edema.  The patient was started on colchicine and allopurinol for gout.   Renal dopplers will be considered at OP follow up.  He had good  UOP.  PT evaluation was completed with recommendations for [facility] PT and supervision.   The patient was seen by Dr. Irish Lack who felt he was stable for DC home.     Consults: PT   Significant Diagnostic Studies:  MYOCARDIAL IMAGING WITH SPECT (REST AND PHARMACOLOGIC-STRESS - 2 DAY PROTOCOL)  GATED LEFT VENTRICULAR WALL MOTION STUDY  LEFT VENTRICULAR EJECTION FRACTION  TECHNIQUE: Standard myocardial SPECT imaging was performed after resting intravenous injection of 30 mCi Tc-71m sestamibi. Subsequently, on a second day, intravenous infusion of Lexiscan was performed under the supervision of the Cardiology staff. At peak effect of the drug, 30 mCi Tc-28m sestamibi was injected intravenously and standard myocardial SPECT imaging was performed. Quantitative gated imaging was also performed to evaluate left ventricular wall motion, and estimate left ventricular ejection fraction.  COMPARISON: None.  FINDINGS: Perfusion: There is a moderate area of mildly decreased activity involving the inferolateral aspect of the left ventricle near the base, stable on stress at rest components. No suggestion of ischemia.  Wall Motion: Global mild hypokinesis. No left ventricular dilation.  Left Ventricular Ejection Fraction: 41 %  End diastolic volume 55 ml  End systolic volume 92 ml  IMPRESSION: 1. No reversible ischemia or infarction. Fixed inferolateral attenuation or scar.  2. Mild global hypokinesis without focal wall motion abnormality.  3. Left ventricular ejection fraction 41%  4. Intermediate-risk stress test findings*.    Echocardiogram Study Conclusions  - Left ventricle: The cavity size was normal. Wall thickness was increased in a pattern of  mild LVH. Systolic function was normal. The estimated ejection fraction was in the range of 60% to 65%. Wall motion was normal; there were no regional wall motion abnormalities. Doppler parameters are  consistent with abnormal left ventricular relaxation (grade 1 diastolic dysfunction).  Impressions:  - The quality of this echo is poor. The LV systtolic function appears to be normal.  no obvious valvular disease although the valves were not seen well.  CHEST 1 VIEW  COMPARISON: 05/24/2014  FINDINGS: Cardiomegaly remains stable. Decreased lung volumes noted. Increased perihilar interstitial prominence noted, suspicious for mild perihilar edema. No focal area of consolidation seen. Prior CABG noted.  IMPRESSION: Decreased lung volumes. Increased perihilar interstitial prominence, suspicious for mild perihilar edema.  Stable cardiomegaly.  Treatments:  See above   Discharge Exam: Blood pressure 164/40, pulse 72, temperature 97.6 F (36.4 C), temperature source Oral, resp. rate 18, height 5\' 7"  (1.702 m), weight 281 lb 8 oz (127.688 kg), SpO2 94 %.  Disposition: 06-Home-Health Care Svc      Discharge Instructions    Diet - low sodium heart healthy    Complete by:  As directed      Discharge instructions    Complete by:  As directed   Monitor your weight every morning.  If you gain 3 pounds in 24 hours, or 5 pounds in a week, call the office for instructions.     Increase activity slowly    Complete by:  As directed             Medication List    STOP taking these medications        lisinopril-hydrochlorothiazide 20-12.5 MG per tablet  Commonly known as:  PRINZIDE,ZESTORETIC      TAKE these medications        allopurinol 100 MG tablet  Commonly known as:  ZYLOPRIM  Take 1 tablet (100 mg total) by mouth daily.     alprazolam 2 MG tablet  Commonly known as:  XANAX  Take 1 mg by mouth at bedtime.     aspirin EC 81 MG tablet  Take 81 mg by mouth every morning.     clopidogrel 75 MG tablet  Commonly known as:  PLAVIX  Take 1 tablet (75 mg total) by mouth daily with breakfast.     colchicine 0.6 MG tablet  Take 1 tablet (0.6 mg total) by  mouth 2 (two) times daily.     cyclobenzaprine 10 MG tablet  Commonly known as:  FLEXERIL  Take 10 mg by mouth at bedtime.     diltiazem 240 MG 24 hr capsule  Commonly known as:  DILACOR XR  Take 240 mg by mouth every morning.     furosemide 40 MG tablet  Commonly known as:  LASIX  take 1 tablet once daily     glipiZIDE 10 MG tablet  Commonly known as:  GLUCOTROL  Take 10 mg by mouth 2 (two) times daily before a meal.     insulin glargine 100 UNIT/ML injection  Commonly known as:  LANTUS  Inject 100 Units into the skin every morning.     isosorbide mononitrate 60 MG 24 hr tablet  Commonly known as:  IMDUR  Take 1 tablet (60 mg total) by mouth daily.     metoprolol 50 MG tablet  Commonly known as:  LOPRESSOR  Take 0.5 tablets (25 mg total) by mouth 2 (two) times daily.     nitroGLYCERIN 0.4 MG SL tablet  Commonly known as:  NITROSTAT  Place 0.4 mg under the tongue every 5 (five) minutes as needed for chest pain.     oxyCODONE-acetaminophen 5-325 MG per tablet  Commonly known as:  PERCOCET/ROXICET  Take 1 tablet by mouth every 6 (six) hours as needed (pain).     polyethylene glycol powder powder  Commonly known as:  GLYCOLAX/MIRALAX  Take 17 g by mouth daily as needed (constipation).     psyllium 58.6 % powder  Commonly known as:  METAMUCIL  Take by mouth every morning.     rosuvastatin 5 MG tablet  Commonly known as:  CRESTOR  Take 1 tablet (5 mg total) by mouth daily at 6 PM.     tamsulosin 0.4 MG Caps capsule  Commonly known as:  FLOMAX  Take 0.4 mg by mouth every evening.     Vitamin D (Ergocalciferol) 50000 UNITS Caps capsule  Commonly known as:  DRISDOL  Take 50,000 Units by mouth every Wednesday.       Follow-up Information    Follow up with [name], MD.   Specialty:  Cardiology   Why:  The office will call you with the follow up appt date and time.   Contact information:   [place] [address] [phone]       Greater than 30 minutes was spent completing the patient's discharge.    SignedTarri Fuller, [place] 05/28/2014, 11:48 AM   I have examined the patient and reviewed assessment and plan and discussed with patient. Agree with above as stated. No large areas of ischemia by stress test. COntinue current medical therapy. WOuld not pursue cath, especially given renal dysfunction. I personally reviewed the nuclear images. Home health PT.  Llana Deshazo S.

## 2014-05-28 NOTE — Progress Notes (Signed)
Patient stated he wanted to get to [facility] for possible bowel movement. Patient positioned to side of bed and assisted to stand, but patient unable to put any weight on right foot due to gout pain. Patient assisted to [facility] while turning on left foot. Patient unable to do much. Very unsteady on feet and will need walker or assist for home use. Needs PT evaluation before going home. Morbid obesity controlling movement ability. Will continue to follow closely.

## 2014-05-28 NOTE — Evaluation (Signed)
Physical Therapy Evaluation Patient Details Name: [name] MRN: [id number] DOB: [birth year] Today's Date: 05/28/2014   History of Present Illness  81 y.o. male admitted with NSTEMI with history of CAD s/p CABG 1994, ?Acute on chronic diastolic CHF, ?Acute on CKD stage IV, and gout.  Clinical Impression  Pt admitted with the above complications. Pt currently with functional limitations due to the deficits listed below (see PT Problem List). Able to ambulate up to 28 feet today with min guard assist and cues for safety throughout. His ambulatory ability is limited by his Rt ankle pain. Anticipate he will progress his functional ability well as he improves medically. Has 24/7 care from his wife at home. Recommend HHPT. Pt reports he already has assistive devices to help with his safety and mobility. Will follow until d/c. Pt will benefit from skilled PT to increase their independence and safety with mobility to allow discharge to the venue listed below.       Follow Up Recommendations Home health PT;Supervision for mobility/OOB    Equipment Recommendations  None recommended by PT    Recommendations for Other Services       Precautions / Restrictions Precautions Precautions: Fall Restrictions Weight Bearing Restrictions: No      Mobility  Bed Mobility Overal bed mobility: Needs Assistance Bed Mobility: Supine to Sit     Supine to sit: Supervision     General bed mobility comments: Heavy use of rail. VC for technique, however did not require physical assist  Transfers Overall transfer level: Needs assistance Equipment used: Rolling walker (2 wheeled) Transfers: Sit to/from Stand Sit to Stand: Min guard         General transfer comment: Close guard for safety. Leans posteriorly, but responds to cues for anterior weight shift.  Performed from lowest bed setting, very slow to rise but did not require physical assist. Good control with descent onto  [facility].  Ambulation/Gait Ambulation/Gait assistance: Min guard Ambulation Distance (Feet): 28 Feet Assistive device: Rolling walker (2 wheeled) Gait Pattern/deviations: Step-to pattern;Decreased step length - left;Decreased stance time - right;Decreased dorsiflexion - right;Decreased stride length;Antalgic;Trunk flexed Gait velocity: Very slow   General Gait Details: Very slow and gaurded. VC for upright posture, forward gaze, and instructions for safe DME use. Encouraged increased use of UEs for support due to moderately antalgic gait. Close guard for safety but did not require physical assist for balance.  Stairs            Wheelchair Mobility    Modified Rankin (Stroke Patients Only)       Balance Overall balance assessment: Needs assistance Sitting-balance support: No upper extremity supported;Feet supported Sitting balance-Leahy Scale: Good     Standing balance support: No upper extremity supported;During functional activity Standing balance-Leahy Scale: Fair Standing balance comment: Able to stand without UE support prior to ambulating, and to reach back for arm rests on BSC.                             Pertinent Vitals/Pain Pain Assessment: 0-10 Pain Score: 0-No pain Pain Location: Rt ankle in weight bearing Pain Descriptors / Indicators: Sore Pain Intervention(s): Limited activity within patient's tolerance;Monitored during session;Repositioned    Home Living Family/patient expects to be discharged to:: Private residence Living Arrangements: Spouse/significant other Available Help at Discharge: Family;Available 24 hours/day Type of Home: House Home Access: Level entry     Home Layout: One level Home Equipment: Walker - 2 wheels;Cane -  single point;Bedside commode;Shower seat;Hospital bed      Prior Function Level of Independence: Independent with assistive device(s)         Comments: Uses RW primarily for ambulation however sometimes usesw  cane. Able to self perform ADLs     Hand Dominance   Dominant Hand: Left    Extremity/Trunk Assessment   Upper Extremity Assessment: Defer to OT evaluation           Lower Extremity Assessment: RLE deficits/detail;Generalized weakness RLE Deficits / Details: Rt ankle, very limited ROM due to pain.       Communication   Communication: No difficulties  Cognition Arousal/Alertness: Awake/alert Behavior During Therapy: WFL for tasks assessed/performed Overall Cognitive Status: Within Functional Limits for tasks assessed                      General Comments General comments (skin integrity, edema, etc.): BP 164/40, HR 77, SpO2 96% on 2L. SpO2 maintained 91-96% while ambulating on room air.    Exercises General Exercises - Lower Extremity Ankle Circles/Pumps: AROM;Right;20 reps;Supine Long Arc Quad: AROM;Both;10 reps;Seated      Assessment/Plan    PT Assessment Patient needs continued PT services  PT Diagnosis Difficulty walking;Abnormality of gait;Generalized weakness;Acute pain   PT Problem List Decreased strength;Decreased range of motion;Decreased activity tolerance;Decreased balance;Decreased mobility;Decreased knowledge of use of DME;Cardiopulmonary status limiting activity;Obesity;Pain  PT Treatment Interventions DME instruction;Gait training;Functional mobility training;Therapeutic activities;Therapeutic exercise;Balance training;Neuromuscular re-education;Patient/family education;Modalities   PT Goals (Current goals can be found in the Care Plan section) Acute Rehab PT Goals Patient Stated Goal: Go home today PT Goal Formulation: With patient Time For Goal Achievement: 06/11/14 Potential to Achieve Goals: Good    Frequency Min 3X/week   Barriers to discharge        Co-evaluation               End of Session Equipment Utilized During Treatment: Gait belt Activity Tolerance: Patient tolerated treatment well Patient left: with call  bell/phone within reach;with nursing/sitter in room;Other (comment) (On BSC) Nurse Communication: Mobility status         Time: [id number] PT Time Calculation (min) (ACUTE ONLY): 27 min   Charges:   PT Evaluation $Initial PT Evaluation Tier I: 1 Procedure PT Treatments $Gait Training: 8-22 mins   PT G Codes:        Ellouise Newer 05/28/2014, 10:38 AM  Elayne Snare, [place]

## 2014-05-28 NOTE — Progress Notes (Signed)
Progress Note  Subjective:    Doing well this AM. Having foot pain, causing him difficulty ambulating.   Objective:   Temp:  [97.5 F (36.4 C)-98.4 F (36.9 C)] 97.6 F (36.4 C) (02/15 0759) Pulse Rate:  [52-68] 56 (02/15 0800) Resp:  [14-20] 17 (02/15 0800) BP: (123-157)/(40-74) 154/74 mmHg (02/15 0800) SpO2:  [90 %-95 %] 93 % (02/15 0800) Last BM Date: 05/27/14  Filed Weights   05/25/14 0300 05/26/14 1110  Weight: 278 lb 8 oz (126.327 kg) 281 lb 8 oz (127.688 kg)    Intake/Output Summary (Last 24 hours) at 05/28/14 0845 Last data filed at 05/28/14 0700  Gross per 24 hour  Intake    812 ml  Output   1826 ml  Net  -1014 ml    Telemetry: NSR  Physical Exam: General: Elderly white male, alert, cooperative, NAD. HEENT: PERRL, EOMI. Moist mucus membranes Neck: Full range of motion without pain, supple, no lymphadenopathy or carotid bruits. JVP difficult to assess.  Lungs: Clear to ascultation bilaterally, normal work of respiration, no wheezes, rales, rhonchi Heart: RRR, no murmurs, gallops, or rubs Abdomen: Soft, non-tender, non-distended, BS + Extremities: No cyanosis or clubbing. +2 pitting edema at ankles.  Neurologic: Alert & oriented x3, cranial nerves II-XII intact, strength grossly intact, sensation intact to light touch   Lab Results:  Basic Metabolic Panel:  Recent Labs Lab 05/25/14 0547 05/26/14 0234 05/28/14 0255  NA 135 131* 135  K 5.2* 4.8 4.5  CL 100 96 102  CO2 26 26 24   GLUCOSE 351* 305* 185*  BUN 52* 68* 60*  CREATININE 2.33* 2.60* 2.00*  CALCIUM 8.9 7.9* 8.5    CBC:  Recent Labs Lab 05/26/14 0234 05/27/14 0325 05/28/14 0255  WBC 9.4 6.9 8.0  HGB 11.4* 12.5* 12.8*  HCT 35.7* 38.5* 38.5*  MCV 87.7 89.1 87.3  PLT 198 190 183    Cardiac Enzymes:  Recent Labs Lab 05/25/14 0547 05/25/14 1114 05/25/14 1723  TROPONINI 0.50* 0.59* 0.56*      Medications:   Scheduled Medications: . allopurinol  100 mg Oral Daily  .  aspirin EC  81 mg Oral Daily  . clopidogrel  75 mg Oral Q breakfast  . colchicine  0.6 mg Oral BID  . diltiazem  240 mg Oral Daily  . furosemide  40 mg Oral Daily  . heparin subcutaneous  5,000 Units Subcutaneous 3 times per day  . insulin aspart  0-20 Units Subcutaneous TID WC  . insulin aspart  0-5 Units Subcutaneous QHS  . insulin glargine  90 Units Subcutaneous Daily  . isosorbide mononitrate  60 mg Oral Daily  . metoprolol tartrate  25 mg Oral Q6H  . rosuvastatin  5 mg Oral q1800  . tamsulosin  0.4 mg Oral Daily    Infusions:    PRN Medications: acetaminophen, ALPRAZolam, docusate sodium, nitroGLYCERIN, ondansetron (ZOFRAN) IV   Assessment and Plan:  81 y/o M w/ PMHx of HTN, HLD, DM type II, COPD, GERD, anxiety, depression, and CAD s/p CABG x4 1994 and mulipple stents placed since that time including DES to native LCx 2006, DES to SVG-RCA 2013, and most recently DES to SVG-PDA, admitted for NSTEMI.   NSTEMI: Currently chest pain free. Myoview yesterday intermediate risk study, will manage medically.  -Continue ASA, Plavix -Continue Cardizem CD 240 mg daily -Lopressor 25 mg q6h -Imdur 60 mg daily; -Crestor 5 mg daily (previous intolerance to zocor, lipitor) -NTG prn  Chronic dCHF: On Lasix 40 mg  daily at home. ECHO from 05/26/14 showed EF of 123456, grade 1 diastolic dysfunction. Mild LVH. Urine output 2.1L in past 24 hours.  -Continue Lasix 40 mg po daily -HOLD off on ACEI in the setting of worsening renal function  ?Acute on CKD: Unclear baseline, most recent Cr prior to this admission 1.1-1.2. Has since been seeing a nephrologist per chart review, Dr. Joelyn Oms?. Per patient, had Renal US, showed one kidney smaller than the other, question renal artery stenosis. Cr improved this AM. Hospital trend as follows:   Recent Labs Lab 05/25/14 0547 05/26/14 0234 05/28/14 0255  CREATININE 2.33* 2.60* 2.00*  -Hold ACEI/HCTZ -Lasix as above -Follow w/ [facility] as  outpatient -Renal Artery [place] pending; may do as outpatient  DM type II: No recent HbA1c. Takes Lantus 100 units qAM + Glipizide 10 mg bid at home. CBG's as follows:   Recent Labs Lab 05/27/14 0719 05/27/14 1209 05/27/14 1616 05/27/14 2313 05/28/14 0735  GLUCAP 190* 186* 235* 214* 148*  -ISS-R + HS coverage -Continue Lantus to 90 units qAM  Gout: Having foot pain, started on Colchicine 0.6 mg bid + Allopurinol 100 mg daily yesterday. -Continue meds as above -PT eval   Natasha Bence, MD PGY-2 Internal Medicine Pager: [phone]    I have examined the patient and reviewed assessment and plan and discussed with patient.  Agree with above as stated.  No large areas of ischemia by stress test.  COntinue current medical therapy.  WOuld not pursue cath, especially given renal dysfunction.  I personally reviewed the nuclear images.    Possible d/c later today based on PT eval. Otherwise, transfer to tele if not d/ced.  Silver Achey S.

## 2014-06-04 LAB — GLUCOSE, CAPILLARY
GLUCOSE-CAPILLARY: 250 mg/dL — AB (ref 70–99)
Glucose-Capillary: 297 mg/dL — ABNORMAL HIGH (ref 70–99)
Glucose-Capillary: 246 mg/dL — ABNORMAL HIGH (ref 70–99)

## 2014-06-06 ENCOUNTER — Ambulatory Visit (INDEPENDENT_AMBULATORY_CARE_PROVIDER_SITE_OTHER): Payer: Medicare Other | Admitting: Cardiology

## 2014-06-06 ENCOUNTER — Encounter: Payer: Self-pay | Admitting: Cardiology

## 2014-06-06 VITALS — BP 110/58 | HR 76 | Ht 64.0 in | Wt 275.0 lb

## 2014-06-06 DIAGNOSIS — I214 Non-ST elevation (NSTEMI) myocardial infarction: Secondary | ICD-10-CM

## 2014-06-06 DIAGNOSIS — I222 Subsequent non-ST elevation (NSTEMI) myocardial infarction: Secondary | ICD-10-CM

## 2014-06-06 DIAGNOSIS — I25119 Atherosclerotic heart disease of native coronary artery with unspecified angina pectoris: Secondary | ICD-10-CM

## 2014-06-06 DIAGNOSIS — I10 Essential (primary) hypertension: Secondary | ICD-10-CM

## 2014-06-06 DIAGNOSIS — N183 Chronic kidney disease, stage 3 unspecified: Secondary | ICD-10-CM

## 2014-06-06 DIAGNOSIS — I255 Ischemic cardiomyopathy: Secondary | ICD-10-CM

## 2014-06-06 MED ORDER — LISINOPRIL 20 MG PO TABS: 20.0000 mg | ORAL_TABLET | Freq: Every day | ORAL | Status: DC

## 2014-06-06 NOTE — Progress Notes (Signed)
Cardiology Office Note  Date: 06/06/2014   ID: [name], DOB 1934/02/22, MRN [id number]  PCP: [name], MD  Primary Cardiologist: [name], MD   Chief Complaint  Patient presents with  . Hospitalization Follow-up  . Coronary Artery Disease  . Hypertension    History of Present Illness: [name] is a 81 y.o. male just recently discharge from [facility] on February 15 following presentation with NSTEMI, peak troponin I only 0.59. He was managed medically after undergoing Lexiscan Cardiolite which demonstrated an area of inferolateral scar versus attenuation without large ischemic zones, LVEF was 41%. By echocardiogram, LVEF looked to be in the range of 60-65% in the setting of limited images.  He is here with his wife today for follow-up. He reports no chest pain, stable dyspnea on exertion. We reviewed his recent hospital stay, discussed his medications. Gout flare has significantly improved. He continues on both allopurinol and colchicine. I asked him to go ahead and stop the colchicine for now.  Medications reviewed, he was taken off of Prinzide during hospitalization with acute on chronic renal insufficiency. Creatinine was as high as 2.6. He was taking Prinzide 20/12.5 mg twice daily prior to this. They have gone back on the medication at once daily now.  We discussed a plan to continue medical therapy for CAD with his increased risk of contrast nephropathy. Reserve cardiac catheterization unless absolutely necessary.   Past Medical History  Diagnosis Date  . Coronary atherosclerosis of native coronary artery     a. CABG x 4 in 1994. Previous stent placement to SVG to distal RCA;  b. DES to native Cx in 2006;  c. DES SVG to RCA 05/08/11; d. Inf MI/Cath/PCI: LM 25, LAD 100, LCX patent stent, 50 into OM, RI 60-70, RCA 100, LIMA->LAD nl, VG->Diag 100 old, VG->RCA/PL 99 (DES x 1), EF 50-55%.  . Sleep apnea   . Essential hypertension, benign   . Depression      With anxious features/hx panic attacks  . Morbid obesity   . Hyperlipidemia     Not on statin 2/2 hx of intolerance.   . Nephrolithiasis   . Allergic rhinitis   . Type 2 diabetes mellitus   . ST elevation myocardial infarction (STEMI) of inferior wall     4/14 - DES SVG to PDA  . COPD (chronic obstructive pulmonary disease)   . Shortness of breath dyspnea   . Pneumonia   . Anxiety   . GERD (gastroesophageal reflux disease)   . Arthritis     Past Surgical History  Procedure Laterality Date  . Coronary artery bypass graft  1994  . Eye surgery    . Left heart catheterization with coronary angiogram N/A 05/08/2011    Procedure: LEFT HEART CATHETERIZATION WITH CORONARY ANGIOGRAM;  Surgeon: [name], MD;  Location: [facility] CATH LAB;  Service: Cardiovascular;  Laterality: N/A;  . Percutaneous coronary stent intervention (pci-s) Right 05/08/2011    Procedure: PERCUTANEOUS CORONARY STENT INTERVENTION (PCI-S);  Surgeon: [name], MD;  Location: [facility] CATH LAB;  Service: Cardiovascular;  Laterality: Right;  . Left heart catheterization with coronary angiogram N/A 08/08/2012    Procedure: LEFT HEART CATHETERIZATION WITH CORONARY ANGIOGRAM;  Surgeon: [name], MD;  Location: [facility] CATH LAB;  Service: Cardiovascular;  Laterality: N/A;  . Percutaneous coronary stent intervention (pci-s) N/A 08/08/2012    Procedure: PERCUTANEOUS CORONARY STENT INTERVENTION (PCI-S);  Surgeon: [name], MD;  Location: [facility] CATH LAB;  Service:  Cardiovascular;  Laterality: N/A;    Current Outpatient Prescriptions  Medication Sig Dispense Refill  . allopurinol (ZYLOPRIM) 100 MG tablet Take 1 tablet (100 mg total) by mouth daily. 30 tablet 2  . alprazolam (XANAX) 2 MG tablet Take 1 mg by mouth at bedtime.     Marland Kitchen aspirin EC 81 MG tablet Take 81 mg by mouth every morning.    . clopidogrel (PLAVIX) 75 MG tablet Take 1 tablet (75 mg total) by mouth daily with breakfast. 30 tablet 6  .  colchicine 0.6 MG tablet Take 1 tablet (0.6 mg total) by mouth 2 (two) times daily. 60 tablet 2  . cyclobenzaprine (FLEXERIL) 10 MG tablet Take 10 mg by mouth at bedtime.     Marland Kitchen diltiazem (CARDIZEM CD) 240 MG 24 hr capsule Take 240 mg by mouth daily.    . furosemide (LASIX) 40 MG tablet take 1 tablet once daily (Patient taking differently: Take 40 mg by mouth every evening. ) 90 tablet 3  . glipiZIDE (GLUCOTROL) 10 MG tablet Take 10 mg by mouth 2 (two) times daily before a meal.    . insulin glargine (LANTUS) 100 UNIT/ML injection Inject 100 Units into the skin every morning.    . isosorbide mononitrate (IMDUR) 30 MG 24 hr tablet Take 30 mg by mouth daily. 2 tab daily    . metoprolol tartrate (LOPRESSOR) 25 MG tablet Take 25 mg by mouth 2 (two) times daily.    . nitroGLYCERIN (NITROSTAT) 0.4 MG SL tablet Place 0.4 mg under the tongue every 5 (five) minutes as needed for chest pain.    . polyethylene glycol powder (GLYCOLAX/MIRALAX) powder Take 17 g by mouth daily as needed (constipation).     . psyllium (METAMUCIL) 58.6 % powder Take by mouth every morning.    . rosuvastatin (CRESTOR) 5 MG tablet Take 1 tablet (5 mg total) by mouth daily at 6 PM. 30 tablet 5  . tamsulosin (FLOMAX) 0.4 MG CAPS capsule Take 0.4 mg by mouth every evening.     . Vitamin D, Ergocalciferol, (DRISDOL) 50000 UNITS CAPS capsule Take 50,000 Units by mouth every Wednesday.  0   No current facility-administered medications for this visit.    Allergies:  Zocor and Lipitor   Social History: The patient  reports that he has never smoked. He has never used smokeless tobacco. He reports that he does not drink alcohol or use illicit drugs.   ROS:  Please see the history of present illness. Otherwise, complete review of systems is positive for arthritic pains although gout has improved. Uses a cane to walk, no falls. All other systems are reviewed and negative.    Physical Exam: VS:  BP 110/58 mmHg  Pulse 76  Ht 5\' 4"  (1.626  m)  Wt 275 lb (124.739 kg)  BMI 47.18 kg/m2  SpO2 94%, BMI Body mass index is 47.18 kg/(m^2).  Wt Readings from Last 3 Encounters:  06/06/14 275 lb (124.739 kg)  05/26/14 281 lb 8 oz (127.688 kg)  01/01/14 279 lb 6.4 oz (126.735 kg)     Morbidly obese male in no acute distress.  HEENT: Conjunctiva and lids normal, oropharynx clear.  Neck: Supple, no elevated JVP or carotid bruits, no thyromegaly.  Lungs: Clear to auscultation, nonlabored breathing at rest.  Cardiac: Regular rate and rhythm, no S3, no pericardial rub.  Abdomen: Soft, nontender, bowel sounds present, no guarding or rebound.  Extremities: No pitting edema, distal pulses 1-2+.  Skin: Warm and dry.  Musculoskeletal: No  kyphosis.  Neuropsychiatric: Alert and oriented x3, affect grossly appropriate.   ECG: ECG from 05/26/2014 showed sinus rhythm with low voltage in the anterior leads associated with poor R wave progression, probable old inferior infarct pattern.   Recent Labwork: 05/26/2014: B Natriuretic Peptide 138.8* 05/28/2014: BUN 60*; Creatinine 2.00*; Hemoglobin 12.8*; Platelets 183; Potassium 4.5; Sodium 135     Component Value Date/Time   CHOL 185 05/25/2014 0549   TRIG 92 05/25/2014 0549   HDL 44 05/25/2014 0549   CHOLHDL 4.2 05/25/2014 0549   VLDL 18 05/25/2014 0549   LDLCALC 123* 05/25/2014 0549    Other Studies Reviewed Today:  1. Echocardiogram 05/26/2014: Study Conclusions  - Left ventricle: The cavity size was normal. Wall thickness was increased in a pattern of mild LVH. Systolic function was normal. The estimated ejection fraction was in the range of 60% to 65%. Wall motion was normal; there were no regional wall motion abnormalities. Doppler parameters are consistent with abnormal left ventricular relaxation (grade 1 diastolic dysfunction).  Impressions:  - The quality of this echo is poor. The LV systtolic function appears to be normal.  -No obvious valvular  disease although the valves were not seen well.  2. Lexiscan Cardiolite 05/26/2014: EXAM: MYOCARDIAL IMAGING WITH SPECT (REST AND PHARMACOLOGIC-STRESS - 2 DAY PROTOCOL)  GATED LEFT VENTRICULAR WALL MOTION STUDY  LEFT VENTRICULAR EJECTION FRACTION  TECHNIQUE: Standard myocardial SPECT imaging was performed after resting intravenous injection of 30 mCi Tc-18m sestamibi. Subsequently, on a second day, intravenous infusion of Lexiscan was performed under the supervision of the Cardiology staff. At peak effect of the drug, 30 mCi Tc-45m sestamibi was injected intravenously and standard myocardial SPECT imaging was performed. Quantitative gated imaging was also performed to evaluate left ventricular wall motion, and estimate left ventricular ejection fraction.  COMPARISON: None.  FINDINGS: Perfusion: There is a moderate area of mildly decreased activity involving the inferolateral aspect of the left ventricle near the base, stable on stress at rest components. No suggestion of ischemia.  Wall Motion: Global mild hypokinesis. No left ventricular dilation.  Left Ventricular Ejection Fraction: 41 %  End diastolic volume 55 ml  End systolic volume 92 ml  IMPRESSION: 1. No reversible ischemia or infarction. Fixed inferolateral attenuation or scar.  2. Mild global hypokinesis without focal wall motion abnormality.  3. Left ventricular ejection fraction 41%  4. Intermediate-risk stress test findings*.  Assessment and Plan:  1. Multivessel native and graft CAD status post previous CABG and percutaneous interventions as outlined above. Recently status post presentation with NSTEMI, peak troponin I only 0.59. He is symptomatically stable at the present time. Cardiolite on medical therapy showed inferolateral scar without large ischemic burden, LVEF 41%. Plan to continue medical therapy for now.  2. Recent acute on chronic renal insufficiency, discharge creatinine  2.0. Plan to stop Prinzide and replace with lisinopril only at 20 mg daily. Follow-up BMET in 2 weeks. Keep follow-up with nephrology.  3. Essential hypertension, blood pressure normal today.  4. Cardiomyopathy with LVEF 41% by recent Cardiolite. Continues on beta blocker, ACE inhibitor, and diuretic.  5. CKD stage 3.  6. Type 2 diabetes mellitus, followed by Dr. Quintin Alto.  Current medicines are reviewed at length with the patient today.  The patient has concerns regarding medicines. Adjustments recommended as outlined above.  Disposition: FU with me in 4 months.   Signed, Satira Sark, MD, [facility] 06/06/2014 11:50 AM    [facility] [place], [place],  [place] [address] Phone: [phone]; Fax: [phone]

## 2014-06-06 NOTE — Patient Instructions (Signed)
Your physician recommends that you schedule a follow-up appointment in: 4 months. Your physician has recommended you make the following change in your medication:  Stop colchicine. Stop lisinopril/hctz. Start lisinopril 20 mg daily. Continue all other medications the same. Your physician recommends that you have lab work in 2 weeks around 06/20/14 to check your BMET.

## 2014-06-21 ENCOUNTER — Telehealth: Payer: Self-pay | Admitting: *Deleted

## 2014-06-21 NOTE — Telephone Encounter (Signed)
Patient informed. 

## 2014-06-21 NOTE — Telephone Encounter (Signed)
-----   Message from Satira Sark, MD sent at 06/21/2014 12:58 PM EST ----- Reviewed. Creatinine remains stable at 2.0, potassium normal at 4.4. No changes for now.

## 2014-09-03 ENCOUNTER — Other Ambulatory Visit: Payer: Self-pay | Admitting: *Deleted

## 2014-09-03 MED ORDER — CLOPIDOGREL BISULFATE 75 MG PO TABS: 75.0000 mg | ORAL_TABLET | Freq: Every day | ORAL | Status: DC

## 2014-09-03 NOTE — Telephone Encounter (Signed)
Plavix refilled today.

## 2014-09-25 ENCOUNTER — Encounter: Payer: Self-pay | Admitting: Cardiology

## 2014-09-25 ENCOUNTER — Ambulatory Visit (INDEPENDENT_AMBULATORY_CARE_PROVIDER_SITE_OTHER): Payer: Medicare Other | Admitting: Cardiology

## 2014-09-25 VITALS — BP 118/70 | HR 80 | Ht 64.0 in | Wt 296.0 lb

## 2014-09-25 DIAGNOSIS — R600 Localized edema: Secondary | ICD-10-CM

## 2014-09-25 DIAGNOSIS — I10 Essential (primary) hypertension: Secondary | ICD-10-CM | POA: Diagnosis not present

## 2014-09-25 DIAGNOSIS — I2510 Atherosclerotic heart disease of native coronary artery without angina pectoris: Secondary | ICD-10-CM | POA: Diagnosis not present

## 2014-09-25 MED ORDER — FUROSEMIDE 40 MG PO TABS: 40.0000 mg | ORAL_TABLET | Freq: Every evening | ORAL | Status: DC

## 2014-09-25 NOTE — Patient Instructions (Signed)
Your physician has recommended you make the following change in your medication:  Increase your furosemide 40 mg to 1&1/2 tablets daily for few days until your edema improves; then, resume 40 mg daily. You may do this periodically if the swelling reoccurs. Continue all other medications the same. Your physician recommends that you have lab work in 6 months just before your next visit to check your BMET. We will notify you when this is due. Your physician recommends that you schedule a follow-up appointment in: 6 months. You will receive a reminder letter in the mail in about 4 months reminding you to call and schedule your appointment. If you don't receive this letter, please contact our office.

## 2014-09-25 NOTE — Progress Notes (Signed)
Cardiology Office Note  Date: 09/25/2014   ID: [name], DOB Jan 24, 1934, MRN [id number]  PCP: [name] Hilding, MD  Primary Cardiologist: [name] Lesches, MD   Chief Complaint  Patient presents with  . Coronary Artery Disease    History of Present Illness: [name] is a 81 y.o. male last seen in February. At that visit medications were modified, he was also scheduled for follow-up lab work. He is here today for a follow-up visit with his wife. He has not required any nitroglycerin since I saw him. Blood pressure control looks good today. He had recent lab work which is outlined below, creatinine had come down from 2.0-1.6.  Only complaint is of increasing weight which is been gradual, also leg edema. We discussed intensifying his Lasix as needed to help address this. He was previously taken off HCTZ which had been in a combination medication with his ACE inhibitor.   Past Medical History  Diagnosis Date  . Coronary atherosclerosis of native coronary artery     a. CABG x 4 in 1994. Previous stent placement to SVG to distal RCA;  b. DES to native Cx in 2006;  c. DES SVG to RCA 05/08/11; d. Inf MI/Cath/PCI: LM 25, LAD 100, LCX patent stent, 50 into OM, RI 60-70, RCA 100, LIMA->LAD nl, VG->Diag 100 old, VG->RCA/PL 99 (DES x 1), EF 50-55%.  . Sleep apnea   . Essential hypertension, benign   . Depression     With anxious features/hx panic attacks  . Morbid obesity   . Hyperlipidemia     Not on statin 2/2 hx of intolerance.   . Nephrolithiasis   . Allergic rhinitis   . Type 2 diabetes mellitus   . ST elevation myocardial infarction (STEMI) of inferior wall     4/14 - DES SVG to PDA  . COPD (chronic obstructive pulmonary disease)   . Pneumonia   . Anxiety   . GERD (gastroesophageal reflux disease)   . Arthritis      Current Outpatient Prescriptions  Medication Sig Dispense Refill  . allopurinol (ZYLOPRIM) 100 MG tablet Take 1 tablet (100 mg total) by mouth daily.  30 tablet 2  . alprazolam (XANAX) 2 MG tablet Take 1 mg by mouth at bedtime.     Marland Kitchen aspirin EC 81 MG tablet Take 81 mg by mouth every morning.    . clopidogrel (PLAVIX) 75 MG tablet Take 1 tablet (75 mg total) by mouth daily with breakfast. 30 tablet 6  . cyclobenzaprine (FLEXERIL) 10 MG tablet Take 10 mg by mouth at bedtime.     Marland Kitchen diltiazem (CARDIZEM CD) 240 MG 24 hr capsule Take 240 mg by mouth daily.    . furosemide (LASIX) 40 MG tablet Take 1 tablet (40 mg total) by mouth every evening. May take 1&1/2 tablets as needed for edema starting 09/25/14 100 tablet 3  . glipiZIDE (GLUCOTROL) 10 MG tablet Take 10 mg by mouth 2 (two) times daily before a meal.    . insulin glargine (LANTUS) 100 UNIT/ML injection Inject 100 Units into the skin every morning.    . isosorbide mononitrate (IMDUR) 60 MG 24 hr tablet Take 60 mg by mouth daily.    Marland Kitchen lisinopril (PRINIVIL,ZESTRIL) 20 MG tablet Take 1 tablet (20 mg total) by mouth daily. 90 tablet 3  . metoprolol tartrate (LOPRESSOR) 25 MG tablet Take 25 mg by mouth 2 (two) times daily.    . nitroGLYCERIN (NITROSTAT) 0.4 MG SL tablet Place 0.4 mg  under the tongue every 5 (five) minutes as needed for chest pain.    Marland Kitchen oxyCODONE-acetaminophen (PERCOCET/ROXICET) 5-325 MG per tablet Take by mouth every 4 (four) hours as needed for severe pain.    . polyethylene glycol powder (GLYCOLAX/MIRALAX) powder Take 17 g by mouth daily as needed (constipation).     . psyllium (METAMUCIL) 58.6 % powder Take by mouth every morning.    . rosuvastatin (CRESTOR) 5 MG tablet Take 1 tablet (5 mg total) by mouth daily at 6 PM. 30 tablet 5  . tamsulosin (FLOMAX) 0.4 MG CAPS capsule Take 0.4 mg by mouth every evening.     . Vitamin D, Ergocalciferol, (DRISDOL) 50000 UNITS CAPS capsule Take 50,000 Units by mouth every Wednesday.  0   No current facility-administered medications for this visit.    Allergies:  Zocor and Lipitor   Social History: The patient  reports that he has never  smoked. He has never used smokeless tobacco. He reports that he does not drink alcohol or use illicit drugs.   ROS:  Please see the history of present illness. Otherwise, complete review of systems is positive for none.  All other systems are reviewed and negative.   Physical Exam: VS:  BP 118/70 mmHg  Pulse 80  Ht 5\' 4"  (1.626 m)  Wt 296 lb (134.265 kg)  BMI 50.78 kg/m2  SpO2 89%, BMI Body mass index is 50.78 kg/(m^2).  Wt Readings from Last 3 Encounters:  09/25/14 296 lb (134.265 kg)  06/06/14 275 lb (124.739 kg)  05/26/14 281 lb 8 oz (127.688 kg)     Morbidly obese male in no acute distress.  HEENT: Conjunctiva and lids normal, oropharynx clear.  Neck: Supple, no elevated JVP or carotid bruits, no thyromegaly.  Lungs: Clear to auscultation, nonlabored breathing at rest.  Cardiac: Regular rate and rhythm, no S3, no pericardial rub.  Abdomen: Obese, nontender, bowel sounds present, no guarding or rebound.  Extremities: 1-2+ leg edema, distal pulses 1-2+.  Skin: Warm and dry.  Musculoskeletal: No kyphosis.  Neuropsychiatric: Alert and oriented x3, affect grossly appropriate.   ECG: ECG is not ordered today.   Recent Labwork: 05/26/2014: B Natriuretic Peptide 138.8* 05/28/2014: BUN 60*; Creatinine, Ser 2.00*; Hemoglobin 12.8*; Platelets 183; Potassium 4.5; Sodium 135     Component Value Date/Time   CHOL 185 05/25/2014 0549   TRIG 92 05/25/2014 0549   HDL 44 05/25/2014 0549   CHOLHDL 4.2 05/25/2014 0549   VLDL 18 05/25/2014 0549   LDLCALC 123* 05/25/2014 0549   08/28/2014: Hemoglobin 13.6, platelets 181, cholesterol 151, triglycerides 277, HDL 41, LDL 55, BUN 33, creatinine 1.6, potassium 4.7, AST 28, ALT 28, hemoglobin A1c 7.9  Other Studies Reviewed Today:  1. Echocardiogram 05/26/2014: Study Conclusions  - Left ventricle: The cavity size was normal. Wall thickness was increased in a pattern of mild LVH. Systolic function was normal. The estimated  ejection fraction was in the range of 60% to 65%. Wall motion was normal; there were no regional wall motion abnormalities. Doppler parameters are consistent with abnormal left ventricular relaxation (grade 1 diastolic dysfunction).  Impressions:  - The quality of this echo is poor. The LV systtolic function appears to be normal.  -No obvious valvular disease although the valves were not seen well.  2. Lexiscan Cardiolite 05/26/2014: FINDINGS: Perfusion: There is a moderate area of mildly decreased activity involving the inferolateral aspect of the left ventricle near the base, stable on stress at rest components. No suggestion of ischemia.  Wall  Motion: Global mild hypokinesis. No left ventricular dilation.  Left Ventricular Ejection Fraction: 41 %  End diastolic volume 55 ml  End systolic volume 92 ml  IMPRESSION: 1. No reversible ischemia or infarction. Fixed inferolateral attenuation or scar.  2. Mild global hypokinesis without focal wall motion abnormality.  3. Left ventricular ejection fraction 41%  4. Intermediate-risk stress test findings*.   Assessment and Plan:  1. Multivessel CAD status post CABG, symptomatically stable medical therapy.  2. Increasing weight and leg edema. He will intensify his Lasix to 60 mg daily for a few days, and then go back to 40 mg daily, carefully following his weight and sodium intake. We talked about adjusting Lasix as needed based on weight gain and edema. Creatinine actually looks better on his most recent lab work.  3. Essential hypertension, blood pressure well controlled today.  Current medicines were reviewed with the patient today.  Disposition: FU with me in 6 months.   Signed, Satira Sark, MD, [facility] 09/25/2014 2:51 PM    [facility] [place], [place], [place] [address] Phone: [phone]; Fax: [phone]

## 2014-12-07 ENCOUNTER — Other Ambulatory Visit: Payer: Self-pay | Admitting: *Deleted

## 2014-12-07 MED ORDER — ISOSORBIDE MONONITRATE ER 60 MG PO TB24: 60.0000 mg | ORAL_TABLET | Freq: Every day | ORAL | Status: DC

## 2015-03-26 ENCOUNTER — Encounter: Payer: Self-pay | Admitting: *Deleted

## 2015-03-26 ENCOUNTER — Encounter: Payer: Self-pay | Admitting: Cardiology

## 2015-03-26 ENCOUNTER — Ambulatory Visit (INDEPENDENT_AMBULATORY_CARE_PROVIDER_SITE_OTHER): Payer: Medicare Other | Admitting: Cardiology

## 2015-03-26 VITALS — BP 153/65 | HR 72 | Ht 64.0 in | Wt 281.8 lb

## 2015-03-26 DIAGNOSIS — I10 Essential (primary) hypertension: Secondary | ICD-10-CM

## 2015-03-26 DIAGNOSIS — I5032 Chronic diastolic (congestive) heart failure: Secondary | ICD-10-CM

## 2015-03-26 DIAGNOSIS — I25119 Atherosclerotic heart disease of native coronary artery with unspecified angina pectoris: Secondary | ICD-10-CM

## 2015-03-26 NOTE — Progress Notes (Signed)
Cardiology Office Note  Date: 03/26/2015   ID: MORELAND DEGENOVA, DOB Dec 24, 1933, MRN [id number]  PCP: [name], MD  Primary Cardiologist: [name], MD   Chief Complaint  Patient presents with  . Coronary Artery Disease  . Diastolic heart failure    History of Present Illness: [name] is a 81 y.o. male last seen in June. He presents today with his wife for a follow-up visit. He states that he was hospitalized at [facility] back in October with fluid overload, presumably diastolic heart failure. I do not have any records for review at this time. He states that he required a short stay in the [facility] for rehabilitation, has been home since late October. His weight is down about 15 pounds from when I last saw him, Lasix dose is now 40 mg twice daily.  He states that he feels as if his fluid status is better, he does remain morbidly obese however. We're requesting his lab work from [facility] and also subsequently at [facility]. He has a history of renal insufficiency.  I reviewed his medications which are outlined below. From a cardiac perspective he continues on aspirin, Plavix, Cardizem CD, Imdur, Lopressor, and Lasix at 40 mg twice daily. He has used sublingual nitroglycerin only one time in the last 6 months.  Past Medical History  Diagnosis Date  . Coronary atherosclerosis of native coronary artery     a. CABG x 4 in 1994. Previous stent placement to SVG to distal RCA;  b. DES to native Cx in 2006;  c. DES SVG to RCA 05/08/11; d. Inf MI/Cath/PCI: LM 25, LAD 100, LCX patent stent, 50 into OM, RI 60-70, RCA 100, LIMA->LAD nl, VG->Diag 100 old, VG->RCA/PL 99 (DES x 1), EF 50-55%.  . Sleep apnea   . Essential hypertension, benign   . Depression     With anxious features/hx panic attacks  . Morbid obesity ([place])   . Hyperlipidemia     Not on statin 2/2 hx of intolerance.   . Nephrolithiasis   . Allergic rhinitis   . Type 2 diabetes mellitus ([place])   .  ST elevation myocardial infarction (STEMI) of inferior wall ([place])     4/14 - DES SVG to PDA  . COPD (chronic obstructive pulmonary disease) ([place])   . Pneumonia   . Anxiety   . GERD (gastroesophageal reflux disease)   . Arthritis     Current Outpatient Prescriptions  Medication Sig Dispense Refill  . allopurinol (ZYLOPRIM) 100 MG tablet Take 1 tablet (100 mg total) by mouth daily. 30 tablet 2  . alprazolam (XANAX) 2 MG tablet Take 1 mg by mouth at bedtime.     Marland Kitchen aspirin EC 81 MG tablet Take 81 mg by mouth every morning.    . clopidogrel (PLAVIX) 75 MG tablet Take 1 tablet (75 mg total) by mouth daily with breakfast. 30 tablet 6  . cyclobenzaprine (FLEXERIL) 10 MG tablet Take 10 mg by mouth at bedtime.     Marland Kitchen diltiazem (CARDIZEM CD) 240 MG 24 hr capsule Take 240 mg by mouth daily.    . furosemide (LASIX) 40 MG tablet Take 1 tablet (40 mg total) by mouth every evening. May take 1&1/2 tablets as needed for edema starting 09/25/14 (Patient taking differently: Take 40 mg by mouth 2 (two) times daily. ) 100 tablet 3  . isosorbide mononitrate (IMDUR) 60 MG 24 hr tablet Take 1 tablet (60 mg total) by mouth daily. 30 tablet 6  .  metoprolol tartrate (LOPRESSOR) 25 MG tablet Take 25 mg by mouth 2 (two) times daily.    . nitroGLYCERIN (NITROSTAT) 0.4 MG SL tablet Place 0.4 mg under the tongue every 5 (five) minutes as needed for chest pain.    Marland Kitchen oxyCODONE-acetaminophen (PERCOCET/ROXICET) 5-325 MG per tablet Take by mouth every 4 (four) hours as needed for severe pain.    . polyethylene glycol powder (GLYCOLAX/MIRALAX) powder Take 17 g by mouth daily as needed (constipation).     . psyllium (METAMUCIL) 58.6 % powder Take by mouth every morning.    . tamsulosin (FLOMAX) 0.4 MG CAPS capsule Take 0.4 mg by mouth every evening.     Marland Kitchen TOUJEO SOLOSTAR 300 UNIT/ML SOPN inject 100 units subcutaneously once daily  0  . Vitamin D, Ergocalciferol, (DRISDOL) 50000 UNITS CAPS capsule Take 50,000 Units by mouth every  Wednesday.  0   No current facility-administered medications for this visit.   Allergies:  Zocor and Lipitor   Social History: The patient  reports that he has never smoked. He has never used smokeless tobacco. He reports that he does not drink alcohol or use illicit drugs.   ROS:  Please see the history of present illness. Otherwise, complete review of systems is positive for decreased hearing, chronic dyspnea on exertion and fatigue.  All other systems are reviewed and negative.   Physical Exam: VS:  BP 153/65 mmHg  Pulse 72  Ht 5\' 4"  (1.626 m)  Wt 281 lb 12.8 oz (127.824 kg)  BMI 48.35 kg/m2  SpO2 92%, BMI Body mass index is 48.35 kg/(m^2).  Wt Readings from Last 3 Encounters:  03/26/15 281 lb 12.8 oz (127.824 kg)  09/25/14 296 lb (134.265 kg)  06/06/14 275 lb (124.739 kg)    Morbidly obese male in no acute distress. HEENT: Conjunctiva and lids normal, oropharynx clear.  Neck: Supple, no elevated JVP or carotid bruits, no thyromegaly.  Lungs: Clear to auscultation, nonlabored breathing at rest.  Cardiac: Regular rate and rhythm, no S3, no pericardial rub.  Abdomen: Obese, protuberant, bowel sounds present, no guarding or rebound.  Extremities: 1-2+ leg edema, distal pulses 1-2+.  Skin: Warm and dry.  Musculoskeletal: No kyphosis.  Neuropsychiatric: Alert and oriented x3, affect grossly appropriate.  ECG: ECG is not ordered today.  Recent Labwork: 05/26/2014: B Natriuretic Peptide 138.8* 05/28/2014: BUN 60*; Creatinine, Ser 2.00*; Hemoglobin 12.8*; Platelets 183; Potassium 4.5; Sodium 135     Component Value Date/Time   CHOL 185 05/25/2014 0549   TRIG 92 05/25/2014 0549   HDL 44 05/25/2014 0549   CHOLHDL 4.2 05/25/2014 0549   VLDL 18 05/25/2014 0549   LDLCALC 123* 05/25/2014 0549  08/28/2014: Hemoglobin 13.6, platelets 181, cholesterol 151, triglycerides 277, HDL 41, LDL 55, BUN 33, creatinine 1.6, potassium 4.7, AST 28, ALT 28, hemoglobin A1c 7.9  Other  Studies Reviewed Today:  1. Echocardiogram 05/26/2014: Study Conclusions  - Left ventricle: The cavity size was normal. Wall thickness was increased in a pattern of mild LVH. Systolic function was normal. The estimated ejection fraction was in the range of 60% to 65%. Wall motion was normal; there were no regional wall motion abnormalities. Doppler parameters are consistent with abnormal left ventricular relaxation (grade 1 diastolic dysfunction).  Impressions:  - The quality of this echo is poor. The LV systtolic function appears to be normal.  -No obvious valvular disease although the valves were not seen well.  2. Lexiscan Cardiolite 05/26/2014: FINDINGS: Perfusion: There is a moderate area of mildly decreased  activity involving the inferolateral aspect of the left ventricle near the base, stable on stress at rest components. No suggestion of ischemia.  Wall Motion: Global mild hypokinesis. No left ventricular dilation.  Left Ventricular Ejection Fraction: 41 %  End diastolic volume 55 ml  End systolic volume 92 ml  IMPRESSION: 1. No reversible ischemia or infarction. Fixed inferolateral attenuation or scar.  2. Mild global hypokinesis without focal wall motion abnormality.  3. Left ventricular ejection fraction 41%  4. Intermediate-risk stress test findings*.  Assessment and Plan:  1. Reported episode of volume overload and presumed diastolic heart failure back in October, managed at [facility]. We are requesting records for review. For now would continue Lasix at 40 mg twice daily. He is not on a potassium supplement this time. Will request his most recent lab work from [facility]. Weight has holding fairly steady at this time around 280 pounds. If weight goes up by 2-3 pounds in 24 hours he can take an additional Lasix tablet.  2. Multivessel CAD status post CABG, Cardiolite study from earlier in the year demonstrated inferolateral  scar without ischemia. LVEF was 60-65% by echocardiography around that time.  3. Essential hypertension. No changes made to current regimen. Would like to review his recent lab work.  Current medicines were reviewed with the patient today.  Disposition: FU with me in 3 months.   Signed, Satira Sark, MD, [facility] 03/26/2015 3:11 PM    [facility] at [place], [place], [place] [address] Phone: [phone]; Fax: [phone]

## 2015-03-26 NOTE — Patient Instructions (Signed)
Your physician recommends that you continue on your current medications as directed. Please refer to the Current Medication list given to you today.  Your physician recommends that you schedule a follow-up appointment in: 3 months  

## 2015-04-12 ENCOUNTER — Other Ambulatory Visit: Payer: Self-pay | Admitting: *Deleted

## 2015-04-12 MED ORDER — CLOPIDOGREL BISULFATE 75 MG PO TABS: 75.0000 mg | ORAL_TABLET | Freq: Every day | ORAL | Status: DC

## 2015-05-23 DIAGNOSIS — E1165 Type 2 diabetes mellitus with hyperglycemia: Secondary | ICD-10-CM | POA: Diagnosis not present

## 2015-05-23 DIAGNOSIS — E782 Mixed hyperlipidemia: Secondary | ICD-10-CM | POA: Diagnosis not present

## 2015-05-23 DIAGNOSIS — N183 Chronic kidney disease, stage 3 (moderate): Secondary | ICD-10-CM | POA: Diagnosis not present

## 2015-05-23 DIAGNOSIS — R5383 Other fatigue: Secondary | ICD-10-CM | POA: Diagnosis not present

## 2015-05-23 DIAGNOSIS — M109 Gout, unspecified: Secondary | ICD-10-CM | POA: Diagnosis not present

## 2015-05-23 DIAGNOSIS — I10 Essential (primary) hypertension: Secondary | ICD-10-CM | POA: Diagnosis not present

## 2015-05-30 DIAGNOSIS — N183 Chronic kidney disease, stage 3 (moderate): Secondary | ICD-10-CM | POA: Diagnosis not present

## 2015-05-30 DIAGNOSIS — M1990 Unspecified osteoarthritis, unspecified site: Secondary | ICD-10-CM | POA: Diagnosis not present

## 2015-05-30 DIAGNOSIS — H6123 Impacted cerumen, bilateral: Secondary | ICD-10-CM | POA: Diagnosis not present

## 2015-05-30 DIAGNOSIS — G4733 Obstructive sleep apnea (adult) (pediatric): Secondary | ICD-10-CM | POA: Diagnosis not present

## 2015-05-30 DIAGNOSIS — E1165 Type 2 diabetes mellitus with hyperglycemia: Secondary | ICD-10-CM | POA: Diagnosis not present

## 2015-05-30 DIAGNOSIS — I10 Essential (primary) hypertension: Secondary | ICD-10-CM | POA: Diagnosis not present

## 2015-05-30 DIAGNOSIS — I5032 Chronic diastolic (congestive) heart failure: Secondary | ICD-10-CM | POA: Diagnosis not present

## 2015-05-30 DIAGNOSIS — F419 Anxiety disorder, unspecified: Secondary | ICD-10-CM | POA: Diagnosis not present

## 2015-05-30 DIAGNOSIS — R609 Edema, unspecified: Secondary | ICD-10-CM | POA: Diagnosis not present

## 2015-05-30 DIAGNOSIS — G4700 Insomnia, unspecified: Secondary | ICD-10-CM | POA: Diagnosis not present

## 2015-05-30 DIAGNOSIS — E782 Mixed hyperlipidemia: Secondary | ICD-10-CM | POA: Diagnosis not present

## 2015-06-28 ENCOUNTER — Encounter: Payer: Self-pay | Admitting: Cardiology

## 2015-06-28 ENCOUNTER — Ambulatory Visit (INDEPENDENT_AMBULATORY_CARE_PROVIDER_SITE_OTHER): Payer: PPO | Admitting: Cardiology

## 2015-06-28 VITALS — BP 158/78 | HR 77 | Ht 65.0 in | Wt 294.0 lb

## 2015-06-28 DIAGNOSIS — I5033 Acute on chronic diastolic (congestive) heart failure: Secondary | ICD-10-CM | POA: Diagnosis not present

## 2015-06-28 DIAGNOSIS — N183 Chronic kidney disease, stage 3 unspecified: Secondary | ICD-10-CM

## 2015-06-28 DIAGNOSIS — I2510 Atherosclerotic heart disease of native coronary artery without angina pectoris: Secondary | ICD-10-CM

## 2015-06-28 NOTE — Patient Instructions (Signed)
Your physician has recommended you make the following change in your medication:   Take furosemide 80 mg in the morning and 40 mg in the middle of the day throughout the weekend; then, Monday 07/01/15 start furosemide 40 mg in the morning and 40 mg in the middle of the day.  Continue all other medications the same. Your physician recommends that you schedule a follow-up appointment in: 3 months.

## 2015-06-28 NOTE — Progress Notes (Signed)
Cardiology Office Note  Date: 06/28/2015   ID: [name], DOB 1934-03-26, MRN [id number]  PCP: [name], MD  Primary Cardiologist: [name], MD   Chief Complaint  Patient presents with  . Diastolic heart failure  . Coronary Artery Disease    History of Present Illness: [name] is an 82 y.o. male last seen in December 2016. He presents today for a routine follow-up visit. His wife did not come with him, she is at home with some back trouble. He tells me that he has been taking his Lasix only once in the morning, skips the evening dose because he has to urinate frequently. His weight is up about 12 pounds.  We reviewed his medications together. His wife usually keeps track of them. Current cardiac regimen includes aspirin, Plavix, Cardizem CD, Imdur, Lopressor, as needed nitroglycerin. Lasix was to be 40 mg twice daily.  I reviewed his recent lab work from February, outlined below.  Past Medical History  Diagnosis Date  . Coronary atherosclerosis of native coronary artery     a. CABG x 4 in 1994. Previous stent placement to SVG to distal RCA;  b. DES to native Cx in 2006;  c. DES SVG to RCA 05/08/11; d. Inf MI/Cath/PCI: LM 25, LAD 100, LCX patent stent, 50 into OM, RI 60-70, RCA 100, LIMA->LAD nl, VG->Diag 100 old, VG->RCA/PL 99 (DES x 1), EF 50-55%.  . Sleep apnea   . Essential hypertension, benign   . Depression     With anxious features/hx panic attacks  . Morbid obesity ([place])   . Hyperlipidemia     Not on statin 2/2 hx of intolerance.   . Nephrolithiasis   . Allergic rhinitis   . Type 2 diabetes mellitus ([place])   . ST elevation myocardial infarction (STEMI) of inferior wall ([place])     4/14 - DES SVG to PDA  . COPD (chronic obstructive pulmonary disease) ([place])   . Pneumonia   . Anxiety   . GERD (gastroesophageal reflux disease)   . Arthritis     Current Outpatient Prescriptions  Medication Sig Dispense Refill  . allopurinol (ZYLOPRIM) 100 MG  tablet Take 1 tablet (100 mg total) by mouth daily. 30 tablet 2  . alprazolam (XANAX) 2 MG tablet Take 1 mg by mouth at bedtime.     Marland Kitchen aspirin EC 81 MG tablet Take 81 mg by mouth every morning.    . clopidogrel (PLAVIX) 75 MG tablet Take 1 tablet (75 mg total) by mouth daily with breakfast. 30 tablet 6  . cyclobenzaprine (FLEXERIL) 10 MG tablet Take 10 mg by mouth at bedtime.     Marland Kitchen diltiazem (CARDIZEM CD) 240 MG 24 hr capsule Take 240 mg by mouth daily.    . isosorbide mononitrate (IMDUR) 60 MG 24 hr tablet Take 1 tablet (60 mg total) by mouth daily. 30 tablet 6  . metoprolol tartrate (LOPRESSOR) 25 MG tablet Take 25 mg by mouth 2 (two) times daily.    . nitroGLYCERIN (NITROLINGUAL) 0.4 MG/SPRAY spray Place 1 spray under the tongue every 5 (five) minutes x 3 doses as needed for chest pain.    . polyethylene glycol powder (GLYCOLAX/MIRALAX) powder Take 17 g by mouth daily as needed (constipation).     . Pseudoephedrine-Naproxen Na (ALEVE COLD & SINUS PO) Take by mouth.    . psyllium (METAMUCIL) 58.6 % powder Take by mouth every morning.    . tamsulosin (FLOMAX) 0.4 MG CAPS capsule Take 0.4 mg  by mouth every evening.     Marland Kitchen TOUJEO SOLOSTAR 300 UNIT/ML SOPN inject 100 units subcutaneously once daily  0  . Vitamin D, Ergocalciferol, (DRISDOL) 50000 UNITS CAPS capsule Take 50,000 Units by mouth every Wednesday.  0   No current facility-administered medications for this visit.   Allergies:  Zocor and Lipitor   Social History: The patient  reports that he has never smoked. He has never used smokeless tobacco. He reports that he does not drink alcohol or use illicit drugs.   ROS:  Please see the history of present illness. Otherwise, complete review of systems is positive for decreased hearing, chronic dyspnea exertion.  All other systems are reviewed and negative.   Physical Exam: VS:  BP 158/78 mmHg  Pulse 77  Ht 5\' 5"  (1.651 m)  Wt 294 lb (133.358 kg)  BMI 48.92 kg/m2  SpO2 92%, BMI Body mass  index is 48.92 kg/(m^2).  Wt Readings from Last 3 Encounters:  06/28/15 294 lb (133.358 kg)  03/26/15 281 lb 12.8 oz (127.824 kg)  09/25/14 296 lb (134.265 kg)    Morbidly obese male in no acute distress. HEENT: Conjunctiva and lids normal, oropharynx clear.  Neck: Supple, no elevated JVP or carotid bruits, no thyromegaly.  Lungs: Clear to auscultation, nonlabored breathing at rest.  Cardiac: Regular rate and rhythm, no S3, no pericardial rub.  Abdomen: Obese, protuberant, bowel sounds present, no guarding or rebound.  Extremities: 1-2+ leg edema, distal pulses 1-2+.  Skin: Warm and dry.  Musculoskeletal: No kyphosis.  Neuropsychiatric: Alert and oriented x3, affect grossly appropriate.  ECG: I personally reviewed the prior tracing from 01/18/2015 which showed sinus rhythm with PVCs, old anterior infarct pattern and vertical axis.  Recent Labwork:     Component Value Date/Time   CHOL 185 05/25/2014 0549   TRIG 92 05/25/2014 0549   HDL 44 05/25/2014 0549   CHOLHDL 4.2 05/25/2014 0549   VLDL 18 05/25/2014 0549   LDLCALC 123* 05/25/2014 0549  February 2017: Hemoglobin 14.0, platelets 159, cholesterol 190, triglycerides 173, HDL 38,LDL 117, BUN 24, creatinine 1.4, potassium 4.3, AST 20, ALT 19, hemoglobin A1c 8.6  Other Studies Reviewed Today:  Echocardiogram 01/18/2015 [facility]): Mild LVH with overall preserved LVEF in the setting of very limited images.  Echocardiogram 05/26/2014: Study Conclusions  - Left ventricle: The cavity size was normal. Wall thickness was increased in a pattern of mild LVH. Systolic function was normal. The estimated ejection fraction was in the range of 60% to 65%. Wall motion was normal; there were no regional wall motion abnormalities. Doppler parameters are consistent with abnormal left ventricular relaxation (grade 1 diastolic dysfunction).  Impressions:  - The quality of this echo is poor. The LV systtolic function  appears to be normal.  -No obvious valvular disease although the valves were not seen well.  Lexiscan Cardiolite 05/26/2014: FINDINGS: Perfusion: There is a moderate area of mildly decreased activity involving the inferolateral aspect of the left ventricle near the base, stable on stress at rest components. No suggestion of ischemia.  Wall Motion: Global mild hypokinesis. No left ventricular dilation.  Left Ventricular Ejection Fraction: 41 %  End diastolic volume 55 ml  End systolic volume 92 ml  IMPRESSION: 1. No reversible ischemia or infarction. Fixed inferolateral attenuation or scar.  2. Mild global hypokinesis without focal wall motion abnormality.  3. Left ventricular ejection fraction 41%  4. Intermediate-risk stress test findings*.  Assessment and Plan:  1. Acute on chronic diastolic heart failure. He has  not been taking Lasix as directed. I asked him to take Lasix 80 mg in the morning and 40 mg in the middle of the day over the weekend, then change to 40 mg twice daily with his second dose being in the middle today to avoid nocturnal urination. He had been tolerating this dose well previously.  2. Multivessel CAD status post CABG and subsequent percutaneous interventions. No active angina at this time on present medical regimen.  3. CKD stage 3, recent creatinine 1.4.  Current medicines were reviewed with the patient today.  Disposition: FU with me in 3 months.   Signed, Satira Sark, MD, [facility] 06/28/2015 3:23 PM    [facility] at [place], [address], [place] [address] Phone: [phone]; Fax: [phone]

## 2015-07-01 ENCOUNTER — Other Ambulatory Visit: Payer: Self-pay | Admitting: Cardiology

## 2015-09-20 ENCOUNTER — Encounter: Payer: Self-pay | Admitting: Cardiology

## 2015-09-20 ENCOUNTER — Other Ambulatory Visit: Payer: Self-pay | Admitting: *Deleted

## 2015-09-20 ENCOUNTER — Ambulatory Visit (INDEPENDENT_AMBULATORY_CARE_PROVIDER_SITE_OTHER): Payer: PPO | Admitting: Cardiology

## 2015-09-20 VITALS — BP 138/78 | HR 61 | Ht 64.0 in | Wt 296.2 lb

## 2015-09-20 DIAGNOSIS — I5033 Acute on chronic diastolic (congestive) heart failure: Secondary | ICD-10-CM

## 2015-09-20 DIAGNOSIS — N183 Chronic kidney disease, stage 3 unspecified: Secondary | ICD-10-CM

## 2015-09-20 DIAGNOSIS — I2510 Atherosclerotic heart disease of native coronary artery without angina pectoris: Secondary | ICD-10-CM

## 2015-09-20 DIAGNOSIS — I10 Essential (primary) hypertension: Secondary | ICD-10-CM

## 2015-09-20 MED ORDER — METOLAZONE 2.5 MG PO TABS: ORAL_TABLET | ORAL | Status: DC

## 2015-09-20 NOTE — Patient Instructions (Signed)
Your physician recommends that you schedule a follow-up appointment in: [place]  Your physician has recommended you make the following change in your medication:   START METOLAZONE 2.5 MG DAILY FOR 2 DAYS THEN TAKE ON MONDAYS [place] 80 MG LASIX EVERY MORNING.   Your physician recommends that you return for lab work in: [place].  Thank you for choosing [facility]!!

## 2015-09-20 NOTE — Progress Notes (Signed)
Cardiology Office Note  Date: 09/20/2015   ID: [name], DOB 12/31/1933, MRN [id number]  PCP: [name], MD  Primary Cardiologist: [name], MD   Chief Complaint  Patient presents with  . Diastolic heart failure  . Coronary Artery Disease    History of Present Illness: [name] is an 82 y.o. male last seen in March. He presents for a follow-up visit. At the last visit we adjusted his Lasix, he has not been taking it consistently. His weight is actually 2 pounds heavier than he was last time. He states that he has been taking Lasix 40 mg, 2 tablets in the morning.  He does not report any angina symptoms. Remaining cardiac regimen includes aspirin, Plavix, Cardizem CD, Imdur, and Lopressor. Lab work from February showed creatinine of 1.4 and potassium 4.3.  Today we talked about adding Zaroxolyn to his Lasix, also discussed sodium and fluid restriction guidelines. May need to consider switching to [facility].  Past Medical History  Diagnosis Date  . Coronary atherosclerosis of native coronary artery     a. CABG x 4 in 1994. Previous stent placement to SVG to distal RCA;  b. DES to native Cx in 2006;  c. DES SVG to RCA 05/08/11; d. Inf MI/Cath/PCI: LM 25, LAD 100, LCX patent stent, 50 into OM, RI 60-70, RCA 100, LIMA->LAD nl, VG->Diag 100 old, VG->RCA/PL 99 (DES x 1), EF 50-55%.  . Sleep apnea   . Essential hypertension, benign   . Depression     With anxious features/hx panic attacks  . Morbid obesity ([place])   . Hyperlipidemia     Not on statin 2/2 hx of intolerance.   . Nephrolithiasis   . Allergic rhinitis   . Type 2 diabetes mellitus ([place])   . ST elevation myocardial infarction (STEMI) of inferior wall ([place])     4/14 - DES SVG to PDA  . COPD (chronic obstructive pulmonary disease) ([place])   . Pneumonia   . Anxiety   . GERD (gastroesophageal reflux disease)   . Arthritis     Past Surgical History  Procedure Laterality Date  . Coronary artery bypass  graft  1994  . Eye surgery    . Left heart catheterization with coronary angiogram N/A 05/08/2011    Procedure: LEFT HEART CATHETERIZATION WITH CORONARY ANGIOGRAM;  Surgeon: [name], MD;  Location: [facility] CATH LAB;  Service: Cardiovascular;  Laterality: N/A;  . Percutaneous coronary stent intervention (pci-s) Right 05/08/2011    Procedure: PERCUTANEOUS CORONARY STENT INTERVENTION (PCI-S);  Surgeon: [name], MD;  Location: [facility] CATH LAB;  Service: Cardiovascular;  Laterality: Right;  . Left heart catheterization with coronary angiogram N/A 08/08/2012    Procedure: LEFT HEART CATHETERIZATION WITH CORONARY ANGIOGRAM;  Surgeon: [name], MD;  Location: [facility] CATH LAB;  Service: Cardiovascular;  Laterality: N/A;  . Percutaneous coronary stent intervention (pci-s) N/A 08/08/2012    Procedure: PERCUTANEOUS CORONARY STENT INTERVENTION (PCI-S);  Surgeon: [name], MD;  Location: [facility] CATH LAB;  Service: Cardiovascular;  Laterality: N/A;    Current Outpatient Prescriptions  Medication Sig Dispense Refill  . furosemide (LASIX) 40 MG tablet Take 80 mg by mouth daily.    Marland Kitchen allopurinol (ZYLOPRIM) 100 MG tablet Take 1 tablet (100 mg total) by mouth daily. 30 tablet 2  . alprazolam (XANAX) 2 MG tablet Take 1 mg by mouth at bedtime.     Marland Kitchen aspirin EC 81 MG tablet Take 81 mg by mouth every morning.    Marland Kitchen  clopidogrel (PLAVIX) 75 MG tablet Take 1 tablet (75 mg total) by mouth daily with breakfast. 30 tablet 6  . cyclobenzaprine (FLEXERIL) 10 MG tablet Take 10 mg by mouth at bedtime.     Marland Kitchen diltiazem (CARDIZEM CD) 240 MG 24 hr capsule Take 240 mg by mouth daily.    . isosorbide mononitrate (IMDUR) 60 MG 24 hr tablet take 1 tablet by mouth once daily 90 tablet 3  . metolazone (ZAROXOLYN) 2.5 MG tablet TAKE DAILY FOR 2 DAYS THEN MON WED FRID 15 tablet 3  . metoprolol tartrate (LOPRESSOR) 25 MG tablet Take 25 mg by mouth 2 (two) times daily.    . nitroGLYCERIN (NITROLINGUAL) 0.4 MG/SPRAY  spray Place 1 spray under the tongue every 5 (five) minutes x 3 doses as needed for chest pain.    . polyethylene glycol powder (GLYCOLAX/MIRALAX) powder Take 17 g by mouth daily as needed (constipation).     . psyllium (METAMUCIL) 58.6 % powder Take by mouth every morning.    . tamsulosin (FLOMAX) 0.4 MG CAPS capsule Take 0.4 mg by mouth every evening.     Marland Kitchen TOUJEO SOLOSTAR 300 UNIT/ML SOPN inject 100 units subcutaneously once daily  0  . Vitamin D, Ergocalciferol, (DRISDOL) 50000 UNITS CAPS capsule Take 50,000 Units by mouth every Wednesday.  0   No current facility-administered medications for this visit.   Allergies:  Zocor and Lipitor   Social History: The patient  reports that he has never smoked. He has never used smokeless tobacco. He reports that he does not drink alcohol or use illicit drugs.   ROS:  Please see the history of present illness. Otherwise, complete review of systems is positive for decreased hearing, chronic dyspnea.  All other systems are reviewed and negative.   Physical Exam: VS:  BP 138/78 mmHg  Pulse 61  Ht 5\' 4"  (1.626 m)  Wt 296 lb 3.2 oz (134.355 kg)  BMI 50.82 kg/m2  SpO2 94%, BMI Body mass index is 50.82 kg/(m^2).  Wt Readings from Last 3 Encounters:  09/20/15 296 lb 3.2 oz (134.355 kg)  06/28/15 294 lb (133.358 kg)  03/26/15 281 lb 12.8 oz (127.824 kg)    Morbidly obese male in no acute distress. HEENT: Conjunctiva and lids normal, oropharynx clear.  Neck: Supple, no elevated JVP or carotid bruits, no thyromegaly.  Lungs: Clear to auscultation, nonlabored breathing at rest.  Cardiac: Regular rate and rhythm, no S3, no pericardial rub.  Abdomen: Obese, protuberant, bowel sounds present, no guarding or rebound.  Extremities: 2+ leg edema, distal pulses 1-2+.  Skin: Warm and dry.  Musculoskeletal: No kyphosis.  Neuropsychiatric: Alert and oriented x3, affect grossly appropriate.  ECG: I personally reviewed the prior tracing from 01/18/2015  which showed sinus rhythm with PVCs, old anterior infarct pattern and vertical axis.  Recent Labwork:  February 2017: Hemoglobin 14.0, platelets 159, cholesterol 190, triglycerides 173, HDL 38,LDL 117, BUN 24, creatinine 1.4, potassium 4.3, AST 20, ALT 19, hemoglobin A1c 8.6  Other Studies Reviewed Today:  Echocardiogram 01/18/2015 [facility]): Mild LVH with overall preserved LVEF in the setting of very limited images.  Echocardiogram 05/26/2014: Study Conclusions  - Left ventricle: The cavity size was normal. Wall thickness was increased in a pattern of mild LVH. Systolic function was normal. The estimated ejection fraction was in the range of 60% to 65%. Wall motion was normal; there were no regional wall motion abnormalities. Doppler parameters are consistent with abnormal left ventricular relaxation (grade 1 diastolic dysfunction).  Impressions:  -  The quality of this echo is poor. The LV systtolic function appears to be normal.  -No obvious valvular disease although the valves were not seen well.  Lexiscan Cardiolite 05/26/2014: FINDINGS: Perfusion: There is a moderate area of mildly decreased activity involving the inferolateral aspect of the left ventricle near the base, stable on stress at rest components. No suggestion of ischemia.  Wall Motion: Global mild hypokinesis. No left ventricular dilation.  Left Ventricular Ejection Fraction: 41 %  End diastolic volume 55 ml  End systolic volume 92 ml  IMPRESSION: 1. No reversible ischemia or infarction. Fixed inferolateral attenuation or scar.  2. Mild global hypokinesis without focal wall motion abnormality.  3. Left ventricular ejection fraction 41%  4. Intermediate-risk stress test findings*.  Assessment and Plan:  1. Acute on chronic diastolic heart failure. We discussed sodium and fluid restriction guidelines. He will continue Lasix 80 mg in the morning, adding Zaroxolyn 2.5 mg on  Monday, Wednesday, and Friday. He will follow-up with a BMET. May need to consider switching to [facility] ultimately.  2. CAD status post CABG and subsequent percutaneous interventions as outlined above. He does not report any angina symptoms and continues on DAPT.  3. Essential hypertension, blood pressure control is adequate today.  4. CKD stage III, last creatinine 1.4.  Current medicines were reviewed with the patient today.   Orders Placed This Encounter  Procedures  . Basic metabolic panel    Disposition: FU with men 4 weeks.   Signed, Satira Sark, MD, [facility] 09/20/2015 4:47 PM    [facility] [place], [place], [place] [address] Phone: [phone]; Fax: [phone]

## 2015-10-04 ENCOUNTER — Other Ambulatory Visit: Payer: Self-pay | Admitting: *Deleted

## 2015-10-04 MED ORDER — FUROSEMIDE 40 MG PO TABS: 80.0000 mg | ORAL_TABLET | Freq: Every day | ORAL | Status: DC

## 2015-10-09 DIAGNOSIS — I10 Essential (primary) hypertension: Secondary | ICD-10-CM | POA: Diagnosis not present

## 2015-10-24 ENCOUNTER — Telehealth: Payer: Self-pay | Admitting: *Deleted

## 2015-10-24 NOTE — Telephone Encounter (Signed)
-----   Message from Satira Sark, MD sent at 10/24/2015  1:22 PM EDT ----- Results reviewed. Creatinine increased from 1.4-2.1. Last addition to Lasix was Zaroxolyn 3 days a week. Please cut Zaroxolyn back to once a week. Needs follow-up BMET for next visit. A copy of this test should be forwarded to [facility], MD.

## 2015-10-25 ENCOUNTER — Ambulatory Visit (INDEPENDENT_AMBULATORY_CARE_PROVIDER_SITE_OTHER): Payer: PPO | Admitting: Cardiology

## 2015-10-25 ENCOUNTER — Encounter: Payer: Self-pay | Admitting: Cardiology

## 2015-10-25 VITALS — BP 128/70 | HR 73 | Ht 64.0 in | Wt 278.4 lb

## 2015-10-25 DIAGNOSIS — N183 Chronic kidney disease, stage 3 unspecified: Secondary | ICD-10-CM

## 2015-10-25 DIAGNOSIS — I2510 Atherosclerotic heart disease of native coronary artery without angina pectoris: Secondary | ICD-10-CM | POA: Diagnosis not present

## 2015-10-25 DIAGNOSIS — I5032 Chronic diastolic (congestive) heart failure: Secondary | ICD-10-CM

## 2015-10-25 NOTE — Patient Instructions (Signed)
Medication Instructions:  Your physician has recommended you make the following change in your medication:  Decrease zaroxolyn to one per week.  Continue all other medications the same.  Labwork: Your physician recommends that you return for lab work in: 2 weeks to check your BMET.  Testing/Procedures: NONE  Follow-Up: Your physician recommends that you schedule a follow-up appointment in: 3 months.  Any Other Special Instructions Will Be Listed Below (If Applicable).  If you need a refill on your cardiac medications before your next appointment, please call your pharmacy.

## 2015-10-25 NOTE — Telephone Encounter (Signed)
Patient informed during office visit today. Copy sent to PCP °

## 2015-10-25 NOTE — Progress Notes (Signed)
Cardiology Office Note  Date: 10/25/2015   ID: [name], DOB 04-27-1933, MRN [id number]  PCP: [name], MD  Primary Cardiologist: [name], MD   Chief Complaint  Patient presents with  . Diastolic heart failure  . Coronary Artery Disease    History of Present Illness: [name] is an 82 y.o. male last seen in June. At the last visit we continued with Lasix dosing and added Zaroxolyn 2.5 mg 3 days a week. He comes in today having lost 18 pounds, and his legs look better.  Follow-up BMET on June 28 showed BUN 74, creatinine 2.1 (up from 1.4.), and potassium 4.0. I have recommended that Zaroxolyn be cut back to once a week. We will plan to follow-up on lab work in a few weeks to make sure that the trend is coming back down.  He does not report any angina at this time. Has chronic weakness, uses a cane. He is very inactive at baseline.  Past Medical History  Diagnosis Date  . Coronary atherosclerosis of native coronary artery     a. CABG x 4 in 1994. Previous stent placement to SVG to distal RCA;  b. DES to native Cx in 2006;  c. DES SVG to RCA 05/08/11; d. Inf MI/Cath/PCI: LM 25, LAD 100, LCX patent stent, 50 into OM, RI 60-70, RCA 100, LIMA->LAD nl, VG->Diag 100 old, VG->RCA/PL 99 (DES x 1), EF 50-55%.  . Sleep apnea   . Essential hypertension, benign   . Depression     With anxious features/hx panic attacks  . Morbid obesity ([place])   . Hyperlipidemia     Not on statin 2/2 hx of intolerance.   . Nephrolithiasis   . Allergic rhinitis   . Type 2 diabetes mellitus ([place])   . ST elevation myocardial infarction (STEMI) of inferior wall ([place])     4/14 - DES SVG to PDA  . COPD (chronic obstructive pulmonary disease) ([place])   . Pneumonia   . Anxiety   . GERD (gastroesophageal reflux disease)   . Arthritis     Current Outpatient Prescriptions  Medication Sig Dispense Refill  . allopurinol (ZYLOPRIM) 100 MG tablet Take 1 tablet (100 mg total) by mouth  daily. 30 tablet 2  . alprazolam (XANAX) 2 MG tablet Take 1 mg by mouth at bedtime.     Marland Kitchen aspirin EC 81 MG tablet Take 81 mg by mouth every morning.    . clopidogrel (PLAVIX) 75 MG tablet Take 1 tablet (75 mg total) by mouth daily with breakfast. 30 tablet 6  . cyclobenzaprine (FLEXERIL) 10 MG tablet Take 10 mg by mouth at bedtime.     Marland Kitchen diltiazem (CARDIZEM CD) 240 MG 24 hr capsule Take 240 mg by mouth daily.    . furosemide (LASIX) 40 MG tablet Take 2 tablets (80 mg total) by mouth daily. 180 tablet 3  . isosorbide mononitrate (IMDUR) 60 MG 24 hr tablet take 1 tablet by mouth once daily 90 tablet 3  . metolazone (ZAROXOLYN) 2.5 MG tablet TAKE DAILY FOR 2 DAYS THEN MON WED FRID 15 tablet 3  . metoprolol tartrate (LOPRESSOR) 25 MG tablet Take 25 mg by mouth 2 (two) times daily.    . nitroGLYCERIN (NITROLINGUAL) 0.4 MG/SPRAY spray Place 1 spray under the tongue every 5 (five) minutes x 3 doses as needed for chest pain.    . polyethylene glycol powder (GLYCOLAX/MIRALAX) powder Take 17 g by mouth daily as needed (constipation).     Marland Kitchen  psyllium (METAMUCIL) 58.6 % powder Take by mouth every morning.    . tamsulosin (FLOMAX) 0.4 MG CAPS capsule Take 0.4 mg by mouth every evening.     Marland Kitchen TOUJEO SOLOSTAR 300 UNIT/ML SOPN inject 100 units subcutaneously once daily  0  . Vitamin D, Ergocalciferol, (DRISDOL) 50000 UNITS CAPS capsule Take 50,000 Units by mouth every Wednesday.  0   No current facility-administered medications for this visit.   Allergies:  Zocor and Lipitor   Social History: The patient  reports that he has never smoked. He has never used smokeless tobacco. He reports that he does not drink alcohol or use illicit drugs.   ROS:  Please see the history of present illness. Otherwise, complete review of systems is positive for decreased hearing.  All other systems are reviewed and negative.   Physical Exam: VS:  BP 128/70 mmHg  Pulse 73  Ht 5\' 4"  (1.626 m)  Wt 278 lb 6.4 oz (126.281 kg)   BMI 47.76 kg/m2  SpO2 90%, BMI Body mass index is 47.76 kg/(m^2).  Wt Readings from Last 3 Encounters:  10/25/15 278 lb 6.4 oz (126.281 kg)  09/20/15 296 lb 3.2 oz (134.355 kg)  06/28/15 294 lb (133.358 kg)    Morbidly obese male in no acute distress. HEENT: Conjunctiva and lids normal, oropharynx clear.  Neck: Supple, no elevated JVP or carotid bruits, no thyromegaly.  Lungs: Clear to auscultation, nonlabored breathing at rest.  Cardiac: Regular rate and rhythm, no S3, no pericardial rub.  Abdomen: Obese, protuberant, bowel sounds present, no guarding or rebound.  Extremities: Improved leg edema, distal pulses 1-2+.  Skin: Warm and dry.  Musculoskeletal: No kyphosis.  Neuropsychiatric: Alert and oriented x3, affect grossly appropriate.  ECG: I personally reviewed the tracing from 01/18/2015 which showed sinus rhythm with PVCs, old anterior infarct pattern and vertical axis.  Recent Labwork: No results found for requested labs within last 365 days.     Component Value Date/Time   CHOL 185 05/25/2014 0549   TRIG 92 05/25/2014 0549   HDL 44 05/25/2014 0549   CHOLHDL 4.2 05/25/2014 0549   VLDL 18 05/25/2014 0549   LDLCALC 123* 05/25/2014 0549    Other Studies Reviewed Today:  Echocardiogram 01/18/2015 [facility]): Mild LVH with overall preserved LVEF in the setting of very limited images.  Echocardiogram 05/26/2014: Study Conclusions  - Left ventricle: The cavity size was normal. Wall thickness was increased in a pattern of mild LVH. Systolic function was normal. The estimated ejection fraction was in the range of 60% to 65%. Wall motion was normal; there were no regional wall motion abnormalities. Doppler parameters are consistent with abnormal left ventricular relaxation (grade 1 diastolic dysfunction).  Impressions:  - The quality of this echo is poor. The LV systtolic function appears to be normal.  -No obvious valvular disease although the  valves were not seen well.  Lexiscan Cardiolite 05/26/2014: FINDINGS: Perfusion: There is a moderate area of mildly decreased activity involving the inferolateral aspect of the left ventricle near the base, stable on stress at rest components. No suggestion of ischemia.  Wall Motion: Global mild hypokinesis. No left ventricular dilation.  Left Ventricular Ejection Fraction: 41 %  End diastolic volume 55 ml  End systolic volume 92 ml  IMPRESSION: 1. No reversible ischemia or infarction. Fixed inferolateral attenuation or scar.  2. Mild global hypokinesis without focal wall motion abnormality.  3. Left ventricular ejection fraction 41%  4. Intermediate-risk stress test findings*.  Assessment and Plan:  1.  Chronic diastolic heart failure. He had a significant diuresis and weight loss following the addition of Zaroxolyn. Renal insufficiency has progressed however, and we will cut his Zaroxolyn back to once a week with stable Lasix dose. Follow-up BMET in 2 weeks.  2. Multivessel CAD status post CABG and percutaneous interventions as outlined above. He does not report any angina symptoms at this time on medical therapy.  3. CKD stage 3, recent creatinine 2.1. We are cutting back his Zaroxolyn dose.  Current medicines were reviewed with the patient today.   Orders Placed This Encounter  Procedures  . Basic metabolic panel    Disposition: Follow-up with me in 3 months.  Signed, Satira Sark, MD, [facility] 10/25/2015 3:19 PM    [facility] [place], [place],  [address] Phone: [phone]; Fax: [phone]

## 2015-11-12 DIAGNOSIS — E1165 Type 2 diabetes mellitus with hyperglycemia: Secondary | ICD-10-CM | POA: Diagnosis not present

## 2015-11-12 DIAGNOSIS — E782 Mixed hyperlipidemia: Secondary | ICD-10-CM | POA: Diagnosis not present

## 2015-11-12 DIAGNOSIS — I10 Essential (primary) hypertension: Secondary | ICD-10-CM | POA: Diagnosis not present

## 2015-11-12 DIAGNOSIS — I482 Chronic atrial fibrillation: Secondary | ICD-10-CM | POA: Diagnosis not present

## 2015-11-12 DIAGNOSIS — N183 Chronic kidney disease, stage 3 (moderate): Secondary | ICD-10-CM | POA: Diagnosis not present

## 2015-11-12 DIAGNOSIS — M109 Gout, unspecified: Secondary | ICD-10-CM | POA: Diagnosis not present

## 2015-11-12 DIAGNOSIS — G4733 Obstructive sleep apnea (adult) (pediatric): Secondary | ICD-10-CM | POA: Diagnosis not present

## 2015-11-14 DIAGNOSIS — I5032 Chronic diastolic (congestive) heart failure: Secondary | ICD-10-CM | POA: Diagnosis not present

## 2015-11-14 DIAGNOSIS — I10 Essential (primary) hypertension: Secondary | ICD-10-CM | POA: Diagnosis not present

## 2015-11-14 DIAGNOSIS — F419 Anxiety disorder, unspecified: Secondary | ICD-10-CM | POA: Diagnosis not present

## 2015-11-14 DIAGNOSIS — E1165 Type 2 diabetes mellitus with hyperglycemia: Secondary | ICD-10-CM | POA: Diagnosis not present

## 2015-11-14 DIAGNOSIS — E782 Mixed hyperlipidemia: Secondary | ICD-10-CM | POA: Diagnosis not present

## 2015-11-14 DIAGNOSIS — Z6841 Body Mass Index (BMI) 40.0 and over, adult: Secondary | ICD-10-CM | POA: Diagnosis not present

## 2015-11-14 DIAGNOSIS — I2510 Atherosclerotic heart disease of native coronary artery without angina pectoris: Secondary | ICD-10-CM | POA: Diagnosis not present

## 2015-11-14 DIAGNOSIS — G4733 Obstructive sleep apnea (adult) (pediatric): Secondary | ICD-10-CM | POA: Diagnosis not present

## 2015-11-20 DIAGNOSIS — R0902 Hypoxemia: Secondary | ICD-10-CM | POA: Diagnosis not present

## 2015-11-20 DIAGNOSIS — G4733 Obstructive sleep apnea (adult) (pediatric): Secondary | ICD-10-CM | POA: Diagnosis not present

## 2015-11-26 ENCOUNTER — Encounter: Payer: Self-pay | Admitting: *Deleted

## 2015-12-21 DIAGNOSIS — R0902 Hypoxemia: Secondary | ICD-10-CM | POA: Diagnosis not present

## 2015-12-21 DIAGNOSIS — G4733 Obstructive sleep apnea (adult) (pediatric): Secondary | ICD-10-CM | POA: Diagnosis not present

## 2016-01-13 ENCOUNTER — Other Ambulatory Visit: Payer: Self-pay | Admitting: Cardiology

## 2016-01-20 DIAGNOSIS — G4733 Obstructive sleep apnea (adult) (pediatric): Secondary | ICD-10-CM | POA: Diagnosis not present

## 2016-01-20 DIAGNOSIS — R0902 Hypoxemia: Secondary | ICD-10-CM | POA: Diagnosis not present

## 2016-01-28 ENCOUNTER — Encounter: Payer: Self-pay | Admitting: *Deleted

## 2016-01-28 NOTE — Progress Notes (Signed)
Cardiology Office Note  Date: 01/29/2016   ID: MATISSE SALAIS, DOB 1933/06/17, MRN [id number]  PCP: [name], MD  Primary Cardiologist: [name], MD   Chief Complaint  Patient presents with  . Diastolic heart failure    History of Present Illness: TALHA ISER is an 82 y.o. male last seen in July. He presents for a routine follow-up visit. States that he has been feeling about the same, his weight is down another 6 pounds. At the last visit we cut his Zaroxolyn back to once a week. His last creatinine was 2.3. I reviewed labwork from September has described below.  ECG today shows sinus rhythm with ventricular bigeminy, IVCD, decreased R wave progression. He does not feel any palpitations, has had no syncope.  We went over his medications and I have asked him to use Zaroxolyn on an as-needed basis for weight gain of 3-5 pounds in 48 hours. Otherwise will continue on stable Lasix dose.  Past Medical History:  Diagnosis Date  . Allergic rhinitis   . Anxiety   . Arthritis   . COPD (chronic obstructive pulmonary disease) ([place])   . Coronary atherosclerosis of native coronary artery    a. CABG x 4 in 1994. Previous stent placement to SVG to distal RCA;  b. DES to native Cx in 2006;  c. DES SVG to RCA 05/08/11; d. Inf MI/Cath/PCI: LM 25, LAD 100, LCX patent stent, 50 into OM, RI 60-70, RCA 100, LIMA->LAD nl, VG->Diag 100 old, VG->RCA/PL 99 (DES x 1), EF 50-55%.  . Depression    With anxious features/hx panic attacks  . Essential hypertension, benign   . GERD (gastroesophageal reflux disease)   . Hyperlipidemia    Not on statin 2/2 hx of intolerance.   . Morbid obesity ([place])   . Nephrolithiasis   . Pneumonia   . Sleep apnea   . ST elevation myocardial infarction (STEMI) of inferior wall ([place])    4/14 - DES SVG to PDA  . Type 2 diabetes mellitus ([place])     Current Outpatient Prescriptions  Medication Sig Dispense Refill  . allopurinol (ZYLOPRIM) 100 MG tablet  Take 1 tablet (100 mg total) by mouth daily. 30 tablet 2  . alprazolam (XANAX) 2 MG tablet Take 1 mg by mouth at bedtime.     Marland Kitchen aspirin EC 81 MG tablet Take 81 mg by mouth every morning.    . clopidogrel (PLAVIX) 75 MG tablet Take 1 tablet (75 mg total) by mouth daily with breakfast. 30 tablet 6  . cyclobenzaprine (FLEXERIL) 10 MG tablet Take 10 mg by mouth at bedtime.     Marland Kitchen diltiazem (CARDIZEM CD) 240 MG 24 hr capsule Take 240 mg by mouth daily.    . furosemide (LASIX) 40 MG tablet Take 2 tablets (80 mg total) by mouth daily. 180 tablet 3  . isosorbide mononitrate (IMDUR) 60 MG 24 hr tablet take 1 tablet by mouth once daily 90 tablet 3  . metolazone (ZAROXOLYN) 2.5 MG tablet TAKE DAILY FOR 2 DAYS THEN MON WED FRID 15 tablet 3  . metoprolol tartrate (LOPRESSOR) 25 MG tablet Take 25 mg by mouth 2 (two) times daily.    . nitroGLYCERIN (NITROLINGUAL) 0.4 MG/SPRAY spray Place 1 spray under the tongue every 5 (five) minutes x 3 doses as needed for chest pain.    . polyethylene glycol powder (GLYCOLAX/MIRALAX) powder Take 17 g by mouth daily as needed (constipation).     . psyllium (METAMUCIL) 58.6 %  powder Take by mouth every morning.    . tamsulosin (FLOMAX) 0.4 MG CAPS capsule Take 0.4 mg by mouth every evening.     Marland Kitchen TOUJEO SOLOSTAR 300 UNIT/ML SOPN inject 100 units subcutaneously once daily  0  . Vitamin D, Ergocalciferol, (DRISDOL) 50000 UNITS CAPS capsule Take 50,000 Units by mouth every Wednesday.  0   No current facility-administered medications for this visit.    Allergies:  Zocor [simvastatin] and Lipitor [atorvastatin]   Social History: The patient  reports that he has never smoked. He has never used smokeless tobacco. He reports that he does not drink alcohol or use drugs.   ROS:  Please see the history of present illness. Otherwise, complete review of systems is positive for decreased hearing, arthritic pains.  All other systems are reviewed and negative.   Physical Exam: VS:  BP  112/78   Pulse 72   Ht 5\' 4"  (1.626 m)   Wt 272 lb (123.4 kg)   SpO2 92%   BMI 46.69 kg/m , BMI Body mass index is 46.69 kg/m.  Wt Readings from Last 3 Encounters:  01/29/16 272 lb (123.4 kg)  10/25/15 278 lb 6.4 oz (126.3 kg)  09/20/15 296 lb 3.2 oz (134.4 kg)    Morbidly obese male in no acute distress. HEENT: Conjunctiva and lids normal, oropharynx clear.  Neck: Supple, no elevated JVP or carotid bruits, no thyromegaly.  Lungs: Clear to auscultation, nonlabored breathing at rest.  Cardiac: Regular rate and rhythm, no S3, no pericardial rub.  Abdomen: Obese, protuberant, bowel sounds present, no guarding or rebound.  Extremities: Improved leg edema, distal pulses 1-2+.   ECG: I personally reviewed the tracing from 01/18/2015 which showed sinus rhythm with PVC, nonspecific ST changes, decreased R wave progression.  Recent Labwork:  August 2017: Hemoglobin 14.8, platelets 179, BUN 73, creatinine 2.3, potassium 4.0, AST 20, ALT 23, cholesterol 156, triglycerides 203, HDL 33, LDL 62, hemoglobin A1c 12.2  Other Studies Reviewed Today:  Echocardiogram 01/18/2015 [facility]): Mild LVH with overall preserved LVEF in the setting of very limited images.  Echocardiogram 05/26/2014: Study Conclusions  - Left ventricle: The cavity size was normal. Wall thickness was increased in a pattern of mild LVH. Systolic function was normal. The estimated ejection fraction was in the range of 60% to 65%. Wall motion was normal; there were no regional wall motion abnormalities. Doppler parameters are consistent with abnormal left ventricular relaxation (grade 1 diastolic dysfunction).  Impressions:  - The quality of this echo is poor. The LV systtolic function appears to be normal.  -No obvious valvular disease although the valves were not seen well.  Lexiscan Cardiolite 05/26/2014: FINDINGS: Perfusion: There is a moderate area of mildly decreased activity involving  the inferolateral aspect of the left ventricle near the base, stable on stress at rest components. No suggestion of ischemia.  Wall Motion: Global mild hypokinesis. No left ventricular dilation.  Left Ventricular Ejection Fraction: 41 %  End diastolic volume 55 ml  End systolic volume 92 ml  IMPRESSION: 1. No reversible ischemia or infarction. Fixed inferolateral attenuation or scar.  2. Mild global hypokinesis without focal wall motion abnormality.  3. Left ventricular ejection fraction 41%  4. Intermediate-risk stress test findings*.  Assessment and Plan:  1. Chronic diastolic heart failure, fluid status looks better, he has lost an additional 6 pounds. As noted above we will continue with present Lasix dose but cut Zaroxolyn to as needed use only.  2. CAD status post CABG and subsequent  percutaneous interventions as outlined above. No active angina symptoms on medical therapy. Cardiolite study from last year as outlined above.  3. CKD stage 3. Last creatinine 2.3.  Current medicines were reviewed with the patient today.   Orders Placed This Encounter  Procedures  . EKG 12-Lead    Disposition: Follow-up with me in 3 months.  Signed, Satira Sark, MD, [facility] 01/29/2016 3:22 PM    [facility] at [place], [place], [place] [address] Phone: [phone]; Fax: [phone]

## 2016-01-29 ENCOUNTER — Encounter: Payer: Self-pay | Admitting: Cardiology

## 2016-01-29 ENCOUNTER — Ambulatory Visit (INDEPENDENT_AMBULATORY_CARE_PROVIDER_SITE_OTHER): Payer: PPO | Admitting: Cardiology

## 2016-01-29 VITALS — BP 112/78 | HR 72 | Ht 64.0 in | Wt 272.0 lb

## 2016-01-29 DIAGNOSIS — I2510 Atherosclerotic heart disease of native coronary artery without angina pectoris: Secondary | ICD-10-CM

## 2016-01-29 DIAGNOSIS — N183 Chronic kidney disease, stage 3 unspecified: Secondary | ICD-10-CM

## 2016-01-29 DIAGNOSIS — I5032 Chronic diastolic (congestive) heart failure: Secondary | ICD-10-CM

## 2016-01-29 MED ORDER — METOLAZONE 2.5 MG PO TABS: ORAL_TABLET | ORAL | 1 refills | Status: DC

## 2016-01-29 NOTE — Patient Instructions (Signed)
Medication Instructions:   Your physician has recommended you make the following change in your medication:   Change metolazone 2.5 mg to taking one tablet daily for 2-3 days as needed only for weight gain of 3-5 lbs in a 48 hour period.  Continue all other medications the same.  Labwork: NONE  Testing/Procedures: NONE  Follow-Up:  Your physician recommends that you schedule a follow-up appointment in: 3 months.  Any Other Special Instructions Will Be Listed Below (If Applicable).  If you need a refill on your cardiac medications before your next appointment, please call your pharmacy.

## 2016-02-20 DIAGNOSIS — G4733 Obstructive sleep apnea (adult) (pediatric): Secondary | ICD-10-CM | POA: Diagnosis not present

## 2016-02-20 DIAGNOSIS — R0902 Hypoxemia: Secondary | ICD-10-CM | POA: Diagnosis not present

## 2016-03-17 DIAGNOSIS — I5032 Chronic diastolic (congestive) heart failure: Secondary | ICD-10-CM | POA: Diagnosis not present

## 2016-03-17 DIAGNOSIS — I10 Essential (primary) hypertension: Secondary | ICD-10-CM | POA: Diagnosis not present

## 2016-03-17 DIAGNOSIS — F419 Anxiety disorder, unspecified: Secondary | ICD-10-CM | POA: Diagnosis not present

## 2016-03-17 DIAGNOSIS — N183 Chronic kidney disease, stage 3 (moderate): Secondary | ICD-10-CM | POA: Diagnosis not present

## 2016-03-17 DIAGNOSIS — E782 Mixed hyperlipidemia: Secondary | ICD-10-CM | POA: Diagnosis not present

## 2016-03-17 DIAGNOSIS — E1165 Type 2 diabetes mellitus with hyperglycemia: Secondary | ICD-10-CM | POA: Diagnosis not present

## 2016-03-19 DIAGNOSIS — G4733 Obstructive sleep apnea (adult) (pediatric): Secondary | ICD-10-CM | POA: Diagnosis not present

## 2016-03-19 DIAGNOSIS — Z6841 Body Mass Index (BMI) 40.0 and over, adult: Secondary | ICD-10-CM | POA: Diagnosis not present

## 2016-03-19 DIAGNOSIS — E782 Mixed hyperlipidemia: Secondary | ICD-10-CM | POA: Diagnosis not present

## 2016-03-19 DIAGNOSIS — F419 Anxiety disorder, unspecified: Secondary | ICD-10-CM | POA: Diagnosis not present

## 2016-03-19 DIAGNOSIS — M1990 Unspecified osteoarthritis, unspecified site: Secondary | ICD-10-CM | POA: Diagnosis not present

## 2016-03-19 DIAGNOSIS — E1165 Type 2 diabetes mellitus with hyperglycemia: Secondary | ICD-10-CM | POA: Diagnosis not present

## 2016-03-19 DIAGNOSIS — I5032 Chronic diastolic (congestive) heart failure: Secondary | ICD-10-CM | POA: Diagnosis not present

## 2016-03-19 DIAGNOSIS — I10 Essential (primary) hypertension: Secondary | ICD-10-CM | POA: Diagnosis not present

## 2016-03-21 DIAGNOSIS — R0902 Hypoxemia: Secondary | ICD-10-CM | POA: Diagnosis not present

## 2016-03-21 DIAGNOSIS — G4733 Obstructive sleep apnea (adult) (pediatric): Secondary | ICD-10-CM | POA: Diagnosis not present

## 2016-04-10 ENCOUNTER — Other Ambulatory Visit: Payer: Self-pay | Admitting: Cardiology

## 2016-04-21 DIAGNOSIS — G4733 Obstructive sleep apnea (adult) (pediatric): Secondary | ICD-10-CM | POA: Diagnosis not present

## 2016-04-21 DIAGNOSIS — R0902 Hypoxemia: Secondary | ICD-10-CM | POA: Diagnosis not present

## 2016-04-30 ENCOUNTER — Ambulatory Visit: Payer: PPO | Admitting: Cardiology

## 2016-05-22 DIAGNOSIS — G4733 Obstructive sleep apnea (adult) (pediatric): Secondary | ICD-10-CM | POA: Diagnosis not present

## 2016-05-22 DIAGNOSIS — R0902 Hypoxemia: Secondary | ICD-10-CM | POA: Diagnosis not present

## 2016-06-02 ENCOUNTER — Ambulatory Visit (INDEPENDENT_AMBULATORY_CARE_PROVIDER_SITE_OTHER): Payer: PPO | Admitting: Cardiology

## 2016-06-02 ENCOUNTER — Encounter: Payer: Self-pay | Admitting: Cardiology

## 2016-06-02 VITALS — BP 150/82 | HR 73 | Ht 64.0 in | Wt 271.0 lb

## 2016-06-02 DIAGNOSIS — Z789 Other specified health status: Secondary | ICD-10-CM

## 2016-06-02 DIAGNOSIS — N183 Chronic kidney disease, stage 3 unspecified: Secondary | ICD-10-CM

## 2016-06-02 DIAGNOSIS — E1159 Type 2 diabetes mellitus with other circulatory complications: Secondary | ICD-10-CM | POA: Diagnosis not present

## 2016-06-02 DIAGNOSIS — E1165 Type 2 diabetes mellitus with hyperglycemia: Secondary | ICD-10-CM

## 2016-06-02 DIAGNOSIS — I25119 Atherosclerotic heart disease of native coronary artery with unspecified angina pectoris: Secondary | ICD-10-CM | POA: Diagnosis not present

## 2016-06-02 DIAGNOSIS — I5032 Chronic diastolic (congestive) heart failure: Secondary | ICD-10-CM

## 2016-06-02 MED ORDER — ISOSORBIDE MONONITRATE ER 60 MG PO TB24: ORAL_TABLET | ORAL | 3 refills | Status: DC

## 2016-06-02 NOTE — Patient Instructions (Signed)
Your physician has recommended you make the following change in your medication:  Increase Imdur(Isosorbide) to 60mg  in the morning & 30mg  in the evening.  Your physician recommends that you schedule a follow-up appointment in: 4 months with Dr. [name].  Thank you for choosing [facility]!!

## 2016-06-02 NOTE — Progress Notes (Signed)
Cardiology Office Note  Date: 06/02/2016   ID: [name], DOB 01/19/1934, MRN [id number]  PCP: [name], MD  Primary Cardiologist: [name], MD   Chief Complaint  Patient presents with  . Diastolic heart failure    History of Present Illness:  [name] is an 83 y.o. male last seen in October 2017. He presents for a routine follow-up visit. Tells me that his wife has been hospitalized recently. He has been getting over to [facility] to visit her with the help of family. He does stable angina symptoms, but states that these quickly resolve with nitroglycerin spray. Seems to have more in the evenings. He is taking his Imdur in the mornings.  I reviewed his medications which are outlined below and stable from a cardiac perspective. He remains on Lasix at 80 mg daily and has had to use Zaroxolyn very little. Actually, his weight is stable compared to last visit. He reports good control of his leg edema.  I reviewed his lab work from PCP done back in December 2017. Creatinine had improved down to 1.9 at that point.  Past Medical History:  Diagnosis Date  . Allergic rhinitis   . Anxiety   . Arthritis   . COPD (chronic obstructive pulmonary disease) ([place])   . Coronary atherosclerosis of native coronary artery    a. CABG x 4 in 1994. Previous stent placement to SVG to distal RCA;  b. DES to native Cx in 2006;  c. DES SVG to RCA 05/08/11; d. Inf MI/Cath/PCI: LM 25, LAD 100, LCX patent stent, 50 into OM, RI 60-70, RCA 100, LIMA->LAD nl, VG->Diag 100 old, VG->RCA/PL 99 (DES x 1), EF 50-55%.  . Depression    With anxious features/hx panic attacks  . Essential hypertension, benign   . GERD (gastroesophageal reflux disease)   . Hyperlipidemia    Not on statin 2/2 hx of intolerance.   . Morbid obesity ([place])   . Nephrolithiasis   . Pneumonia   . Sleep apnea   . ST elevation myocardial infarction (STEMI) of inferior wall ([place])    4/14 - DES SVG to PDA  . Type 2  diabetes mellitus ([place])     Past Surgical History:  Procedure Laterality Date  . CORONARY ARTERY BYPASS GRAFT  1994  . EYE SURGERY    . LEFT HEART CATHETERIZATION WITH CORONARY ANGIOGRAM N/A 05/08/2011   Procedure: LEFT HEART CATHETERIZATION WITH CORONARY ANGIOGRAM;  Surgeon: [name], MD;  Location: [facility] CATH LAB;  Service: Cardiovascular;  Laterality: N/A;  . LEFT HEART CATHETERIZATION WITH CORONARY ANGIOGRAM N/A 08/08/2012   Procedure: LEFT HEART CATHETERIZATION WITH CORONARY ANGIOGRAM;  Surgeon: [name], MD;  Location: [facility] CATH LAB;  Service: Cardiovascular;  Laterality: N/A;  . PERCUTANEOUS CORONARY STENT INTERVENTION (PCI-S) Right 05/08/2011   Procedure: PERCUTANEOUS CORONARY STENT INTERVENTION (PCI-S);  Surgeon: [name], MD;  Location: [facility] CATH LAB;  Service: Cardiovascular;  Laterality: Right;  . PERCUTANEOUS CORONARY STENT INTERVENTION (PCI-S) N/A 08/08/2012   Procedure: PERCUTANEOUS CORONARY STENT INTERVENTION (PCI-S);  Surgeon: [name], MD;  Location[facility] CATH LAB;  Service: Cardiovascular;  Laterality: N/A;    Current Outpatient Prescriptions  Medication Sig Dispense Refill  . allopurinol (ZYLOPRIM) 100 MG tablet Take 1 tablet (100 mg total) by mouth daily. 30 tablet 2  . alprazolam (XANAX) 2 MG tablet Take 1 mg by mouth at bedtime.     Marland Kitchen aspirin EC 81 MG tablet Take 81 mg by mouth  every morning.    . clopidogrel (PLAVIX) 75 MG tablet take 1 tablet by mouth once daily with BREAKFAST 90 tablet 0  . cyclobenzaprine (FLEXERIL) 10 MG tablet Take 10 mg by mouth at bedtime.     Marland Kitchen diltiazem (CARDIZEM CD) 240 MG 24 hr capsule Take 240 mg by mouth daily.    . furosemide (LASIX) 40 MG tablet Take 2 tablets (80 mg total) by mouth daily. 180 tablet 3  . metolazone (ZAROXOLYN) 2.5 MG tablet 01/29/16 change in directions Take one day for 2-3 days as needed only for weight gain of 3-5 lbs in a 48 hour period. 15 tablet 1  . metoprolol tartrate  (LOPRESSOR) 25 MG tablet Take 25 mg by mouth 2 (two) times daily.    . nitroGLYCERIN (NITROLINGUAL) 0.4 MG/SPRAY spray Place 1 spray under the tongue every 5 (five) minutes x 3 doses as needed for chest pain.    . polyethylene glycol powder (GLYCOLAX/MIRALAX) powder Take 17 g by mouth daily as needed (constipation).     . psyllium (METAMUCIL) 58.6 % powder Take by mouth every morning.    . tamsulosin (FLOMAX) 0.4 MG CAPS capsule Take 0.4 mg by mouth every evening.     Marland Kitchen TOUJEO SOLOSTAR 300 UNIT/ML SOPN inject 100 units subcutaneously once daily  0  . Vitamin D, Ergocalciferol, (DRISDOL) 50000 UNITS CAPS capsule Take 50,000 Units by mouth every Wednesday.  0  . isosorbide mononitrate (IMDUR) 60 MG 24 hr tablet 60mg  1 tab in the morning & 1/2 tablet in the evening 45 tablet 3   No current facility-administered medications for this visit.    Allergies:  Zocor [simvastatin] and Lipitor [atorvastatin]   Social History: The patient  reports that he has never smoked. He has never used smokeless tobacco. He reports that he does not drink alcohol or use drugs.   ROS:  Please see the history of present illness. Otherwise, complete review of systems is positive for hearing loss, arthritic stiffness and pain.  All other systems are reviewed and negative.   Physical Exam: VS:  BP (!) 150/82   Pulse 73   Ht 5\' 4"  (1.626 m)   Wt 271 lb (122.9 kg)   SpO2 94%   BMI 46.52 kg/m , BMI Body mass index is 46.52 kg/m.  Wt Readings from Last 3 Encounters:  06/02/16 271 lb (122.9 kg)  01/29/16 272 lb (123.4 kg)  10/25/15 278 lb 6.4 oz (126.3 kg)    Morbidly obese male in no acute distress.Using a cane. HEENT: Conjunctiva and lids normal, oropharynx clear.  Neck: Supple, no elevated JVP or carotid bruits, no thyromegaly.  Lungs: Clear to auscultation, nonlabored breathing at rest.  Cardiac: Regular rate and rhythm with ectopy, no S3, no pericardial rub.  Abdomen: Obese, protuberant, bowel sounds  present, no guarding or rebound.  Extremities: Chronic appearing mild leg edema, distal pulses 1-2+. Skin: Warm and dry. Musculoskeletal: No kyphosis. Circumflex neuropsychiatric: Alert and oriented 3, affect appropriate.  ECG: I personally reviewed the tracing from 01/29/2016 which showed sinus rhythm with ventricular bigeminy, IVCD, poor R wave progression.  Recent Labwork: December 2017: Hemoglobin A1c 10.1, cholesterol 211, triglycerides 361, HDL 36, LDL 103, BUN 51, creatinine 1.9, potassium 4.2, AST 20, ALT 18, hemoglobin 14.2, platelets 167     Component Value Date/Time   CHOL 185 05/25/2014 0549   TRIG 92 05/25/2014 0549   HDL 44 05/25/2014 0549   CHOLHDL 4.2 05/25/2014 0549   VLDL 18 05/25/2014 0549  LDLCALC 123 (H) 05/25/2014 0549    Other Studies Reviewed Today:  Echocardiogram 01/18/2015 [facility]): Mild LVH with overall preserved LVEF in the setting of very limited images.  Echocardiogram 05/26/2014: Study Conclusions  - Left ventricle: The cavity size was normal. Wall thickness was increased in a pattern of mild LVH. Systolic function was normal. The estimated ejection fraction was in the range of 60% to 65%. Wall motion was normal; there were no regional wall motion abnormalities. Doppler parameters are consistent with abnormal left ventricular relaxation (grade 1 diastolic dysfunction).  Impressions:  - The quality of this echo is poor. The LV systtolic function appears to be normal.  -No obvious valvular disease although the valves were not seen well.  Lexiscan Cardiolite 05/26/2014: FINDINGS: Perfusion: There is a moderate area of mildly decreased activity involving the inferolateral aspect of the left ventricle near the base, stable on stress at rest components. No suggestion of ischemia.  Wall Motion: Global mild hypokinesis. No left ventricular dilation.  Left Ventricular Ejection Fraction: 41 %  End diastolic volume 55  ml  End systolic volume 92 ml  IMPRESSION: 1. No reversible ischemia or infarction. Fixed inferolateral attenuation or scar.  2. Mild global hypokinesis without focal wall motion abnormality.  3. Left ventricular ejection fraction 41%  4. Intermediate-risk stress test findings*.  Assessment and Plan:  1. CAD status post CABG with subsequent PCI as outlined above. He reports stable angina symptoms at this time on medical therapy. We will change Imdur to 60 mg in the morning and 30 mg in the evening. Cardiolite from February 2016 revealed inferolateral scar but no active ischemia.  2. Chronic diastolic heart failure, weight is quite stable compared to last visit. Continue current dose of Lasix with as needed Zaroxolyn for weight gain of 2-3 pounds in 24 hours or 5 pounds in a week.  3. CKD stage 3, most recent creatinine 1.9.  4. Type 2 diabetes mellitus, uncontrolled. Weight loss discussed. Keep follow-up with PCP. Last hemoglobin A1c was 10.1.  5. Statin intolerance.  Current medicines were reviewed with the patient today.  Disposition: Follow up in 4 months.  Signed, Satira Sark, MD, [facility] 06/02/2016 8:43 AM    [facility] [place], [place], [place] [address] Phone: [phone]; Fax: [phone]

## 2016-06-18 ENCOUNTER — Other Ambulatory Visit: Payer: Self-pay | Admitting: Cardiology

## 2016-06-19 DIAGNOSIS — G4733 Obstructive sleep apnea (adult) (pediatric): Secondary | ICD-10-CM | POA: Diagnosis not present

## 2016-06-19 DIAGNOSIS — R0902 Hypoxemia: Secondary | ICD-10-CM | POA: Diagnosis not present

## 2016-07-08 ENCOUNTER — Other Ambulatory Visit: Payer: Self-pay | Admitting: Cardiology

## 2016-07-09 ENCOUNTER — Other Ambulatory Visit: Payer: Self-pay | Admitting: Cardiology

## 2016-07-19 ENCOUNTER — Emergency Department (HOSPITAL_COMMUNITY): Payer: PPO

## 2016-07-19 ENCOUNTER — Inpatient Hospital Stay (HOSPITAL_COMMUNITY)
Admission: EM | Admit: 2016-07-19 | Discharge: 2016-07-27 | DRG: 291 | Disposition: A | Discharge status: Home / Self Care | Payer: PPO | Attending: Family Medicine | Admitting: Family Medicine

## 2016-07-19 ENCOUNTER — Encounter (HOSPITAL_COMMUNITY): Payer: Self-pay | Admitting: Emergency Medicine

## 2016-07-19 DIAGNOSIS — Z6841 Body Mass Index (BMI) 40.0 and over, adult: Secondary | ICD-10-CM | POA: Diagnosis not present

## 2016-07-19 DIAGNOSIS — E119 Type 2 diabetes mellitus without complications: Secondary | ICD-10-CM | POA: Diagnosis not present

## 2016-07-19 DIAGNOSIS — I10 Essential (primary) hypertension: Secondary | ICD-10-CM | POA: Diagnosis not present

## 2016-07-19 DIAGNOSIS — J449 Chronic obstructive pulmonary disease, unspecified: Secondary | ICD-10-CM | POA: Diagnosis present

## 2016-07-19 DIAGNOSIS — E1159 Type 2 diabetes mellitus with other circulatory complications: Secondary | ICD-10-CM | POA: Diagnosis not present

## 2016-07-19 DIAGNOSIS — I200 Unstable angina: Secondary | ICD-10-CM | POA: Diagnosis not present

## 2016-07-19 DIAGNOSIS — I5033 Acute on chronic diastolic (congestive) heart failure: Secondary | ICD-10-CM | POA: Diagnosis not present

## 2016-07-19 DIAGNOSIS — N183 Chronic kidney disease, stage 3 unspecified: Secondary | ICD-10-CM | POA: Diagnosis present

## 2016-07-19 DIAGNOSIS — E1122 Type 2 diabetes mellitus with diabetic chronic kidney disease: Secondary | ICD-10-CM | POA: Diagnosis not present

## 2016-07-19 DIAGNOSIS — I208 Other forms of angina pectoris: Secondary | ICD-10-CM | POA: Diagnosis not present

## 2016-07-19 DIAGNOSIS — Z794 Long term (current) use of insulin: Secondary | ICD-10-CM | POA: Diagnosis not present

## 2016-07-19 DIAGNOSIS — Z789 Other specified health status: Secondary | ICD-10-CM | POA: Diagnosis not present

## 2016-07-19 DIAGNOSIS — Z951 Presence of aortocoronary bypass graft: Secondary | ICD-10-CM | POA: Diagnosis not present

## 2016-07-19 DIAGNOSIS — M1A9XX0 Chronic gout, unspecified, without tophus (tophi): Secondary | ICD-10-CM | POA: Diagnosis present

## 2016-07-19 DIAGNOSIS — E785 Hyperlipidemia, unspecified: Secondary | ICD-10-CM | POA: Diagnosis present

## 2016-07-19 DIAGNOSIS — I209 Angina pectoris, unspecified: Secondary | ICD-10-CM | POA: Diagnosis not present

## 2016-07-19 DIAGNOSIS — I445 Left posterior fascicular block: Secondary | ICD-10-CM | POA: Diagnosis present

## 2016-07-19 DIAGNOSIS — I252 Old myocardial infarction: Secondary | ICD-10-CM

## 2016-07-19 DIAGNOSIS — Z7982 Long term (current) use of aspirin: Secondary | ICD-10-CM

## 2016-07-19 DIAGNOSIS — Z9111 Patient's noncompliance with dietary regimen: Secondary | ICD-10-CM

## 2016-07-19 DIAGNOSIS — I25810 Atherosclerosis of coronary artery bypass graft(s) without angina pectoris: Secondary | ICD-10-CM | POA: Diagnosis present

## 2016-07-19 DIAGNOSIS — I25708 Atherosclerosis of coronary artery bypass graft(s), unspecified, with other forms of angina pectoris: Secondary | ICD-10-CM | POA: Diagnosis not present

## 2016-07-19 DIAGNOSIS — I2510 Atherosclerotic heart disease of native coronary artery without angina pectoris: Secondary | ICD-10-CM | POA: Diagnosis not present

## 2016-07-19 DIAGNOSIS — I130 Hypertensive heart and chronic kidney disease with heart failure and stage 1 through stage 4 chronic kidney disease, or unspecified chronic kidney disease: Principal | ICD-10-CM | POA: Diagnosis present

## 2016-07-19 DIAGNOSIS — Z888 Allergy status to other drugs, medicaments and biological substances status: Secondary | ICD-10-CM

## 2016-07-19 DIAGNOSIS — I5042 Chronic combined systolic (congestive) and diastolic (congestive) heart failure: Secondary | ICD-10-CM | POA: Diagnosis present

## 2016-07-19 DIAGNOSIS — K219 Gastro-esophageal reflux disease without esophagitis: Secondary | ICD-10-CM | POA: Diagnosis present

## 2016-07-19 DIAGNOSIS — E1165 Type 2 diabetes mellitus with hyperglycemia: Secondary | ICD-10-CM | POA: Diagnosis not present

## 2016-07-19 DIAGNOSIS — Z79899 Other long term (current) drug therapy: Secondary | ICD-10-CM | POA: Diagnosis not present

## 2016-07-19 DIAGNOSIS — I248 Other forms of acute ischemic heart disease: Secondary | ICD-10-CM | POA: Diagnosis not present

## 2016-07-19 DIAGNOSIS — R079 Chest pain, unspecified: Secondary | ICD-10-CM | POA: Diagnosis present

## 2016-07-19 DIAGNOSIS — Z7902 Long term (current) use of antithrombotics/antiplatelets: Secondary | ICD-10-CM

## 2016-07-19 DIAGNOSIS — E782 Mixed hyperlipidemia: Secondary | ICD-10-CM | POA: Diagnosis not present

## 2016-07-19 DIAGNOSIS — I25701 Atherosclerosis of coronary artery bypass graft(s), unspecified, with angina pectoris with documented spasm: Secondary | ICD-10-CM | POA: Diagnosis not present

## 2016-07-19 DIAGNOSIS — Z955 Presence of coronary angioplasty implant and graft: Secondary | ICD-10-CM

## 2016-07-19 DIAGNOSIS — I25110 Atherosclerotic heart disease of native coronary artery with unstable angina pectoris: Secondary | ICD-10-CM | POA: Diagnosis present

## 2016-07-19 DIAGNOSIS — I5032 Chronic diastolic (congestive) heart failure: Secondary | ICD-10-CM | POA: Diagnosis not present

## 2016-07-19 DIAGNOSIS — I25700 Atherosclerosis of coronary artery bypass graft(s), unspecified, with unstable angina pectoris: Secondary | ICD-10-CM | POA: Diagnosis present

## 2016-07-19 LAB — CBC
HEMATOCRIT: 40.1 % (ref 39.0–52.0)
HEMOGLOBIN: 12.9 g/dL — AB (ref 13.0–17.0)
MCH: 29.8 pg (ref 26.0–34.0)
MCHC: 32.2 g/dL (ref 30.0–36.0)
MCV: 92.6 fL (ref 78.0–100.0)
Platelets: 170 10*3/uL (ref 150–400)
RBC: 4.33 MIL/uL (ref 4.22–5.81)
RDW: 15.2 % (ref 11.5–15.5)
WBC: 6.7 10*3/uL (ref 4.0–10.5)

## 2016-07-19 LAB — HEPATIC FUNCTION PANEL
ALBUMIN: 3.7 g/dL (ref 3.5–5.0)
ALK PHOS: 47 U/L (ref 38–126)
ALT: 15 U/L — ABNORMAL LOW (ref 17–63)
AST: 7 U/L — AB (ref 15–41)
Bilirubin, Direct: <0.1 mg/dL — ABNORMAL LOW (ref 0.1–0.5)
Total Bilirubin: 0.2 mg/dL — ABNORMAL LOW (ref 0.3–1.2)
Total Protein: 6.9 g/dL (ref 6.5–8.1)

## 2016-07-19 LAB — I-STAT TROPONIN, ED: TROPONIN I, POC: 0.05 ng/mL (ref 0.00–0.08)

## 2016-07-19 LAB — BASIC METABOLIC PANEL
ANION GAP: 8 (ref 5–15)
BUN: 30 mg/dL — ABNORMAL HIGH (ref 6–20)
CALCIUM: 9 mg/dL (ref 8.9–10.3)
CHLORIDE: 105 mmol/L (ref 101–111)
CO2: 24 mmol/L (ref 22–32)
Creatinine, Ser: 1.59 mg/dL — ABNORMAL HIGH (ref 0.61–1.24)
GFR calc non Af Amer: 39 mL/min — ABNORMAL LOW (ref >60)
GFR, EST AFRICAN AMERICAN: 45 mL/min — AB (ref >60)
GLUCOSE: 235 mg/dL — AB (ref 65–99)
Potassium: 4.8 mmol/L (ref 3.5–5.1)
Sodium: 137 mmol/L (ref 135–145)

## 2016-07-19 LAB — BRAIN NATRIURETIC PEPTIDE: B Natriuretic Peptide: 227 pg/mL — ABNORMAL HIGH (ref 0.0–100.0)

## 2016-07-19 MED ORDER — NITROGLYCERIN 0.4 MG SL SUBL
0.4000 mg | SUBLINGUAL_TABLET | SUBLINGUAL | Status: DC | PRN
  Administered 2016-07-19: 0.4 mg via SUBLINGUAL
  Filled 2016-07-19: qty 1

## 2016-07-19 MED ORDER — SODIUM CHLORIDE 0.9 % IV BOLUS (SEPSIS)
250.0000 mL | Freq: Once | INTRAVENOUS | Status: AC
  Administered 2016-07-19: 250 mL via INTRAVENOUS

## 2016-07-19 MED ORDER — MORPHINE SULFATE (PF) 2 MG/ML IV SOLN
2.0000 mg | Freq: Once | INTRAVENOUS | Status: AC
  Administered 2016-07-19: 2 mg via INTRAVENOUS
  Filled 2016-07-19: qty 1

## 2016-07-19 MED ORDER — FUROSEMIDE 10 MG/ML IJ SOLN
40.0000 mg | Freq: Once | INTRAMUSCULAR | Status: AC
  Administered 2016-07-19: 40 mg via INTRAVENOUS
  Filled 2016-07-19: qty 4

## 2016-07-19 NOTE — ED Provider Notes (Signed)
[facility] DEPT Provider Note   CSN: [id number] Arrival date & time: 07/19/16  2127     History   Chief Complaint Chief Complaint  Patient presents with  . Chest Pain    HPI [name] is a 82 y.o. male.  Patient states that he's been having chest pain periodically today. He has been using nitroglycerin and it has helped the discomfort.   The history is provided by the patient. No language interpreter was used.  Chest Pain   This is a recurrent problem. The current episode started 6 to 12 hours ago. The problem occurs rarely. The problem has been resolved. The pain is associated with exertion. The pain is present in the substernal region. The pain is at a severity of 4/10. The pain is moderate. Pertinent negatives include no abdominal pain, no back pain, no cough and no headaches.  Pertinent negatives for past medical history include no seizures.    Past Medical History:  Diagnosis Date  . Allergic rhinitis   . Anxiety   . Arthritis   . COPD (chronic obstructive pulmonary disease) ([place])   . Coronary atherosclerosis of native coronary artery    a. CABG x 4 in 1994. Previous stent placement to SVG to distal RCA;  b. DES to native Cx in 2006;  c. DES SVG to RCA 05/08/11; d. Inf MI/Cath/PCI: LM 25, LAD 100, LCX patent stent, 50 into OM, RI 60-70, RCA 100, LIMA->LAD nl, VG->Diag 100 old, VG->RCA/PL 99 (DES x 1), EF 50-55%.  . Depression    With anxious features/hx panic attacks  . Essential hypertension, benign   . GERD (gastroesophageal reflux disease)   . Hyperlipidemia    Not on statin 2/2 hx of intolerance.   . Morbid obesity ([place])   . Nephrolithiasis   . Pneumonia   . Sleep apnea   . ST elevation myocardial infarction (STEMI) of inferior wall ([place])    4/14 - DES SVG to PDA  . Type 2 diabetes mellitus [facility])     Patient Active Problem List   Diagnosis Date Noted  . Chest pain 07/19/2016  . CKD (chronic kidney disease) stage 3, GFR 30-59 ml/min 01/01/2014  .  Chronic diastolic heart failure ([place]) 08/11/2012  . CAD (coronary artery disease) of artery bypass graft 08/08/2012  . Sleep apnea   . Essential hypertension, benign   . Morbid obesity ([place])   . Hyperlipidemia   . Type II or unspecified type diabetes mellitus without mention of complication, uncontrolled   . Coronary atherosclerosis of native coronary artery     Past Surgical History:  Procedure Laterality Date  . CORONARY ARTERY BYPASS GRAFT  1994  . EYE SURGERY    . LEFT HEART CATHETERIZATION WITH CORONARY ANGIOGRAM N/A 05/08/2011   Procedure: LEFT HEART CATHETERIZATION WITH CORONARY ANGIOGRAM;  Surgeon: [name], MD;  Location: [facility] CATH LAB;  Service: Cardiovascular;  Laterality: N/A;  . LEFT HEART CATHETERIZATION WITH CORONARY ANGIOGRAM N/A 08/08/2012   Procedure: LEFT HEART CATHETERIZATION WITH CORONARY ANGIOGRAM;  Surgeon: [name], MD;  Location: [facility] CATH LAB;  Service: Cardiovascular;  Laterality: N/A;  . PERCUTANEOUS CORONARY STENT INTERVENTION (PCI-S) Right 05/08/2011   Procedure: PERCUTANEOUS CORONARY STENT INTERVENTION (PCI-S);  Surgeon: [name], MD;  Location[facility] CATH LAB;  Service: Cardiovascular;  Laterality: Right;  . PERCUTANEOUS CORONARY STENT INTERVENTION (PCI-S) N/A 08/08/2012   Procedure: PERCUTANEOUS CORONARY STENT INTERVENTION (PCI-S);  Surgeon: [name], MD;  Location: [facility] CATH LAB;  Service: Cardiovascular;  Laterality: N/A;       Home Medications    Prior to Admission medications   Medication Sig Start Date End Date Taking? Authorizing Provider  allopurinol (ZYLOPRIM) 100 MG tablet Take 1 tablet (100 mg total) by mouth daily. 05/28/14   Brett Canales, PA-C  alprazolam Duanne Moron) 2 MG tablet Take 1 mg by mouth at bedtime.     Historical Provider, MD  aspirin EC 81 MG tablet Take 81 mg by mouth every morning.    Historical Provider, MD  clopidogrel (PLAVIX) 75 MG tablet take 1 tablet by mouth once daily WITH BREAKFAST 07/09/16    Satira Sark, MD  cyclobenzaprine (FLEXERIL) 10 MG tablet Take 10 mg by mouth at bedtime.     Historical Provider, MD  diltiazem (CARDIZEM CD) 240 MG 24 hr capsule Take 240 mg by mouth daily.    Historical Provider, MD  furosemide (LASIX) 40 MG tablet Take 2 tablets (80 mg total) by mouth daily. 10/04/15   Satira Sark, MD  isosorbide mononitrate (IMDUR) 60 MG 24 hr tablet 60mg  1 tab in the morning & 1/2 tablet in the evening 06/02/16   Satira Sark, MD  metolazone (ZAROXOLYN) 2.5 MG tablet 01/29/16 change in directions Take one day for 2-3 days as needed only for weight gain of 3-5 lbs in a 48 hour period. 01/29/16   Satira Sark, MD  metoprolol tartrate (LOPRESSOR) 25 MG tablet Take 25 mg by mouth 2 (two) times daily.    Historical Provider, MD  nitroGLYCERIN (NITROLINGUAL) 0.4 MG/SPRAY spray use 1-2 sprays under the tongue if needed for chest pain *MAXIMUM OF 3 SPRAYS IN 15 MINUTES 07/09/16   Satira Sark, MD  polyethylene glycol powder (GLYCOLAX/MIRALAX) powder Take 17 g by mouth daily as needed (constipation).  02/08/13   Historical Provider, MD  psyllium (METAMUCIL) 58.6 % powder Take by mouth every morning.    Historical Provider, MD  tamsulosin (FLOMAX) 0.4 MG CAPS capsule Take 0.4 mg by mouth every evening.  01/11/13   Historical Provider, MD  TOUJEO SOLOSTAR 300 UNIT/ML SOPN inject 100 units subcutaneously once daily 12/18/14   Historical Provider, MD  Vitamin D, Ergocalciferol, (DRISDOL) 50000 UNITS CAPS capsule Take 50,000 Units by mouth every Wednesday. 04/18/14   Historical Provider, MD    Family History Family History  Problem Relation Age of Onset  . Heart attack Mother     Age 76    Social History Social History  Substance Use Topics  . Smoking status: Never Smoker  . Smokeless tobacco: Never Used  . Alcohol use No     Allergies   Zocor [simvastatin] and Lipitor [atorvastatin]   Review of Systems Review of Systems  Constitutional: Negative for  appetite change and fatigue.  HENT: Negative for congestion, ear discharge and sinus pressure.   Eyes: Negative for discharge.  Respiratory: Negative for cough.   Cardiovascular: Positive for chest pain.  Gastrointestinal: Negative for abdominal pain and diarrhea.  Genitourinary: Negative for frequency and hematuria.  Musculoskeletal: Negative for back pain.  Skin: Negative for rash.  Neurological: Negative for seizures and headaches.  Psychiatric/Behavioral: Negative for hallucinations.     Physical Exam Updated Vital Signs BP (!) 147/69   Pulse 77   Temp 99.3 F (37.4 C) (Oral)   Resp 17   Ht 5\' 4"  (1.626 m)   Wt 271 lb (122.9 kg)   SpO2 94%   BMI 46.52 kg/m   Physical Exam  Constitutional: He is oriented  to person, place, and time. He appears well-developed.  HENT:  Head: Normocephalic.  Eyes: Conjunctivae and EOM are normal. No scleral icterus.  Neck: Neck supple. No thyromegaly present.  Cardiovascular: Normal rate and regular rhythm.  Exam reveals no gallop and no friction rub.   No murmur heard. Pulmonary/Chest: No stridor. He has no wheezes. He has no rales. He exhibits no tenderness.  Abdominal: He exhibits no distension. There is no tenderness. There is no rebound.  Musculoskeletal: Normal range of motion. He exhibits no edema.  Lymphadenopathy:    He has no cervical adenopathy.  Neurological: He is oriented to person, place, and time. He exhibits normal muscle tone. Coordination normal.  Skin: No rash noted. No erythema.  Psychiatric: He has a normal mood and affect. His behavior is normal.     ED Treatments / Results  Labs (all labs ordered are listed, but only abnormal results are displayed) Labs Reviewed  BASIC METABOLIC PANEL - Abnormal; Notable for the following:       Result Value   Glucose, Bld 235 (*)    BUN 30 (*)    Creatinine, Ser 1.59 (*)    GFR calc non Af Amer 39 (*)    GFR calc Af Amer 45 (*)    All other components within normal  limits  CBC - Abnormal; Notable for the following:    Hemoglobin 12.9 (*)    All other components within normal limits  BRAIN NATRIURETIC PEPTIDE - Abnormal; Notable for the following:    B Natriuretic Peptide 227.0 (*)    All other components within normal limits  HEPATIC FUNCTION PANEL - Abnormal; Notable for the following:    AST 7 (*)    ALT 15 (*)    Total Bilirubin 0.2 (*)    Bilirubin, Direct <0.1 (*)    All other components within normal limits  I-STAT TROPOININ, ED    EKG  EKG Interpretation None       Radiology Dg Chest Portable 1 View  Result Date: 07/19/2016 CLINICAL DATA:  Chest pain today radiating to left arm and jaw. EXAM: PORTABLE CHEST 1 VIEW COMPARISON:  01/17/2015 and 05/26/2014 FINDINGS: Sternotomy wires are intact. Lungs are adequately inflated demonstrate mild hazy opacification over the left base and mild prominence of the perihilar markings without significant change likely representing mild chronic vascular congestion. Cannot exclude effusion/atelectasis or infection in the left base. Stable moderate cardiomegaly. Calcified plaque over the aortic arch. Degenerative change of the spine. IMPRESSION: Cardiomegaly with findings suggesting mild chronic vascular congestion. Stable hazy opacification over the left base as cannot exclude effusion, atelectasis or infection. Consider follow-up PA and lateral chest radiograph for better evaluation of the left base. Electronically Signed   By: Marin Olp M.D.   On: 07/19/2016 22:31    Procedures Procedures (including critical care time)  Medications Ordered in ED Medications  nitroGLYCERIN (NITROSTAT) SL tablet 0.4 mg (not administered)  furosemide (LASIX) injection 40 mg (not administered)  morphine 2 MG/ML injection 2 mg (2 mg Intravenous Given 07/19/16 2204)  sodium chloride 0.9 % bolus 250 mL (0 mLs Intravenous Stopped 07/19/16 2237)     Initial Impression / Assessment and Plan / ED Course  I have reviewed  the triage vital signs and the nursing notes.  Pertinent labs & imaging results that were available during my care of the patient were reviewed by me and considered in my medical decision making (see chart for details).     Patient with history  coronary artery disease. Patient's chest pain has resolved with nitroglycerin and morphine. Patient's first troponin was normal. He will be admitted to medicine and cardiology will consult in the a.m.  Final Clinical Impressions(s) / ED Diagnoses   Final diagnoses:  Unstable angina pectoris [facility])    New Prescriptions New Prescriptions   No medications on file     Milton Ferguson, MD 07/19/16 2248

## 2016-07-19 NOTE — ED Triage Notes (Signed)
Pt states that he has not been taking fluid pills as prescribed.  Started having chest pain today that radiates into left arm and jaw.  Has been using Nitro spray at home.

## 2016-07-19 NOTE — ED Notes (Addendum)
SpO2 90% on Room Air. Pt placed on nasal cannula on 2 liters per [employer]. Pt states he is normally on 2 liters of Oxygen at home. SpO2 now 94% on [place]

## 2016-07-20 ENCOUNTER — Observation Stay (HOSPITAL_BASED_OUTPATIENT_CLINIC_OR_DEPARTMENT_OTHER): Payer: PPO

## 2016-07-20 ENCOUNTER — Encounter (HOSPITAL_COMMUNITY): Payer: Self-pay | Admitting: *Deleted

## 2016-07-20 DIAGNOSIS — I130 Hypertensive heart and chronic kidney disease with heart failure and stage 1 through stage 4 chronic kidney disease, or unspecified chronic kidney disease: Secondary | ICD-10-CM | POA: Diagnosis not present

## 2016-07-20 DIAGNOSIS — I200 Unstable angina: Secondary | ICD-10-CM | POA: Diagnosis not present

## 2016-07-20 DIAGNOSIS — E1159 Type 2 diabetes mellitus with other circulatory complications: Secondary | ICD-10-CM | POA: Diagnosis not present

## 2016-07-20 DIAGNOSIS — Z79899 Other long term (current) drug therapy: Secondary | ICD-10-CM | POA: Diagnosis not present

## 2016-07-20 DIAGNOSIS — E1165 Type 2 diabetes mellitus with hyperglycemia: Secondary | ICD-10-CM

## 2016-07-20 DIAGNOSIS — I209 Angina pectoris, unspecified: Secondary | ICD-10-CM | POA: Diagnosis not present

## 2016-07-20 DIAGNOSIS — E1122 Type 2 diabetes mellitus with diabetic chronic kidney disease: Secondary | ICD-10-CM | POA: Diagnosis not present

## 2016-07-20 DIAGNOSIS — R072 Precordial pain: Secondary | ICD-10-CM

## 2016-07-20 DIAGNOSIS — I5032 Chronic diastolic (congestive) heart failure: Secondary | ICD-10-CM

## 2016-07-20 DIAGNOSIS — Z789 Other specified health status: Secondary | ICD-10-CM

## 2016-07-20 DIAGNOSIS — Z7982 Long term (current) use of aspirin: Secondary | ICD-10-CM | POA: Diagnosis not present

## 2016-07-20 DIAGNOSIS — I25700 Atherosclerosis of coronary artery bypass graft(s), unspecified, with unstable angina pectoris: Secondary | ICD-10-CM | POA: Diagnosis not present

## 2016-07-20 DIAGNOSIS — I25708 Atherosclerosis of coronary artery bypass graft(s), unspecified, with other forms of angina pectoris: Secondary | ICD-10-CM | POA: Diagnosis not present

## 2016-07-20 DIAGNOSIS — I10 Essential (primary) hypertension: Secondary | ICD-10-CM

## 2016-07-20 DIAGNOSIS — Z7902 Long term (current) use of antithrombotics/antiplatelets: Secondary | ICD-10-CM | POA: Diagnosis not present

## 2016-07-20 DIAGNOSIS — N183 Chronic kidney disease, stage 3 (moderate): Secondary | ICD-10-CM | POA: Diagnosis not present

## 2016-07-20 DIAGNOSIS — I252 Old myocardial infarction: Secondary | ICD-10-CM | POA: Diagnosis not present

## 2016-07-20 DIAGNOSIS — I248 Other forms of acute ischemic heart disease: Secondary | ICD-10-CM | POA: Diagnosis not present

## 2016-07-20 DIAGNOSIS — Z955 Presence of coronary angioplasty implant and graft: Secondary | ICD-10-CM | POA: Diagnosis not present

## 2016-07-20 DIAGNOSIS — I25701 Atherosclerosis of coronary artery bypass graft(s), unspecified, with angina pectoris with documented spasm: Secondary | ICD-10-CM

## 2016-07-20 DIAGNOSIS — G4733 Obstructive sleep apnea (adult) (pediatric): Secondary | ICD-10-CM | POA: Diagnosis not present

## 2016-07-20 DIAGNOSIS — Z9111 Patient's noncompliance with dietary regimen: Secondary | ICD-10-CM | POA: Diagnosis not present

## 2016-07-20 DIAGNOSIS — R0902 Hypoxemia: Secondary | ICD-10-CM | POA: Diagnosis not present

## 2016-07-20 DIAGNOSIS — J449 Chronic obstructive pulmonary disease, unspecified: Secondary | ICD-10-CM | POA: Diagnosis not present

## 2016-07-20 DIAGNOSIS — I445 Left posterior fascicular block: Secondary | ICD-10-CM | POA: Diagnosis not present

## 2016-07-20 DIAGNOSIS — Z794 Long term (current) use of insulin: Secondary | ICD-10-CM | POA: Diagnosis not present

## 2016-07-20 DIAGNOSIS — I2510 Atherosclerotic heart disease of native coronary artery without angina pectoris: Secondary | ICD-10-CM | POA: Diagnosis not present

## 2016-07-20 DIAGNOSIS — E782 Mixed hyperlipidemia: Secondary | ICD-10-CM

## 2016-07-20 DIAGNOSIS — K219 Gastro-esophageal reflux disease without esophagitis: Secondary | ICD-10-CM | POA: Diagnosis not present

## 2016-07-20 DIAGNOSIS — Z951 Presence of aortocoronary bypass graft: Secondary | ICD-10-CM | POA: Diagnosis not present

## 2016-07-20 DIAGNOSIS — E119 Type 2 diabetes mellitus without complications: Secondary | ICD-10-CM | POA: Diagnosis not present

## 2016-07-20 DIAGNOSIS — Z6841 Body Mass Index (BMI) 40.0 and over, adult: Secondary | ICD-10-CM | POA: Diagnosis not present

## 2016-07-20 DIAGNOSIS — I5033 Acute on chronic diastolic (congestive) heart failure: Secondary | ICD-10-CM

## 2016-07-20 DIAGNOSIS — E785 Hyperlipidemia, unspecified: Secondary | ICD-10-CM | POA: Diagnosis not present

## 2016-07-20 DIAGNOSIS — M1A9XX0 Chronic gout, unspecified, without tophus (tophi): Secondary | ICD-10-CM | POA: Diagnosis not present

## 2016-07-20 DIAGNOSIS — I208 Other forms of angina pectoris: Secondary | ICD-10-CM | POA: Diagnosis not present

## 2016-07-20 DIAGNOSIS — Z888 Allergy status to other drugs, medicaments and biological substances status: Secondary | ICD-10-CM | POA: Diagnosis not present

## 2016-07-20 LAB — CBC
HCT: 40.2 % (ref 39.0–52.0)
HEMOGLOBIN: 13.1 g/dL (ref 13.0–17.0)
MCH: 30.1 pg (ref 26.0–34.0)
MCHC: 32.6 g/dL (ref 30.0–36.0)
MCV: 92.4 fL (ref 78.0–100.0)
PLATELETS: 157 10*3/uL (ref 150–400)
RBC: 4.35 MIL/uL (ref 4.22–5.81)
RDW: 15.1 % (ref 11.5–15.5)
WBC: 6.4 10*3/uL (ref 4.0–10.5)

## 2016-07-20 LAB — GLUCOSE, CAPILLARY
GLUCOSE-CAPILLARY: 114 mg/dL — AB (ref 65–99)
GLUCOSE-CAPILLARY: 124 mg/dL — AB (ref 65–99)
GLUCOSE-CAPILLARY: 154 mg/dL — AB (ref 65–99)
Glucose-Capillary: 124 mg/dL — ABNORMAL HIGH (ref 65–99)
Glucose-Capillary: 145 mg/dL — ABNORMAL HIGH (ref 65–99)

## 2016-07-20 LAB — ECHOCARDIOGRAM COMPLETE
HEIGHTINCHES: 64 in
WEIGHTICAEL: 4537.6 [oz_av]

## 2016-07-20 LAB — TROPONIN I
Troponin I: 0.13 ng/mL (ref <0.03)
Troponin I: 0.12 ng/mL (ref <0.03)
Troponin I: 0.08 ng/mL (ref <0.03)
Troponin I: 0.07 ng/mL (ref <0.03)

## 2016-07-20 LAB — CREATININE, SERUM
CREATININE: 1.5 mg/dL — AB (ref 0.61–1.24)
GFR, EST AFRICAN AMERICAN: 49 mL/min — AB (ref >60)
GFR, EST NON AFRICAN AMERICAN: 42 mL/min — AB (ref >60)

## 2016-07-20 MED ORDER — INSULIN ASPART 100 UNIT/ML ~~LOC~~ SOLN
0.0000 [IU] | Freq: Three times a day (TID) | SUBCUTANEOUS | Status: DC
  Administered 2016-07-20 (×2): 1 [IU] via SUBCUTANEOUS
  Administered 2016-07-21 (×2): 2 [IU] via SUBCUTANEOUS
  Administered 2016-07-22 (×2): 3 [IU] via SUBCUTANEOUS
  Administered 2016-07-23: 1 [IU] via SUBCUTANEOUS
  Administered 2016-07-23: 3 [IU] via SUBCUTANEOUS
  Administered 2016-07-24: 1 [IU] via SUBCUTANEOUS
  Administered 2016-07-24: 2 [IU] via SUBCUTANEOUS
  Administered 2016-07-25: 7 [IU] via SUBCUTANEOUS
  Administered 2016-07-25: 3 [IU] via SUBCUTANEOUS
  Administered 2016-07-25: 9 [IU] via SUBCUTANEOUS
  Administered 2016-07-26: 1 [IU] via SUBCUTANEOUS
  Administered 2016-07-26: 3 [IU] via SUBCUTANEOUS
  Administered 2016-07-26: 2 [IU] via SUBCUTANEOUS
  Administered 2016-07-27: 1 [IU] via SUBCUTANEOUS
  Administered 2016-07-27: 2 [IU] via SUBCUTANEOUS

## 2016-07-20 MED ORDER — ALLOPURINOL 100 MG PO TABS
100.0000 mg | ORAL_TABLET | Freq: Every day | ORAL | Status: DC
  Administered 2016-07-20 – 2016-07-27 (×8): 100 mg via ORAL
  Filled 2016-07-20 (×9): qty 1

## 2016-07-20 MED ORDER — ACETAMINOPHEN 325 MG PO TABS
650.0000 mg | ORAL_TABLET | Freq: Four times a day (QID) | ORAL | Status: DC | PRN
  Administered 2016-07-20 – 2016-07-26 (×6): 650 mg via ORAL
  Filled 2016-07-20 (×6): qty 2

## 2016-07-20 MED ORDER — MORPHINE SULFATE (PF) 2 MG/ML IV SOLN
2.0000 mg | INTRAVENOUS | Status: DC | PRN
  Administered 2016-07-20 – 2016-07-23 (×13): 2 mg via INTRAVENOUS
  Filled 2016-07-20 (×13): qty 1

## 2016-07-20 MED ORDER — DILTIAZEM HCL ER COATED BEADS 240 MG PO CP24
240.0000 mg | ORAL_CAPSULE | Freq: Every day | ORAL | Status: DC
  Administered 2016-07-20 – 2016-07-27 (×8): 240 mg via ORAL
  Filled 2016-07-20 (×8): qty 1

## 2016-07-20 MED ORDER — ASPIRIN EC 81 MG PO TBEC
81.0000 mg | DELAYED_RELEASE_TABLET | Freq: Every morning | ORAL | Status: DC
  Administered 2016-07-20 – 2016-07-27 (×8): 81 mg via ORAL
  Filled 2016-07-20 (×8): qty 1

## 2016-07-20 MED ORDER — ENOXAPARIN SODIUM 40 MG/0.4ML ~~LOC~~ SOLN
40.0000 mg | SUBCUTANEOUS | Status: DC
  Administered 2016-07-20 – 2016-07-27 (×8): 40 mg via SUBCUTANEOUS
  Filled 2016-07-20 (×9): qty 0.4

## 2016-07-20 MED ORDER — FUROSEMIDE 10 MG/ML IJ SOLN
40.0000 mg | Freq: Two times a day (BID) | INTRAMUSCULAR | Status: DC
  Administered 2016-07-20 – 2016-07-22 (×5): 40 mg via INTRAVENOUS
  Filled 2016-07-20 (×5): qty 4

## 2016-07-20 MED ORDER — ONDANSETRON HCL 4 MG/2ML IJ SOLN: 4.0000 mg | Freq: Four times a day (QID) | INTRAMUSCULAR | Status: DC | PRN

## 2016-07-20 MED ORDER — CYCLOBENZAPRINE HCL 10 MG PO TABS
10.0000 mg | ORAL_TABLET | Freq: Every day | ORAL | Status: DC
  Administered 2016-07-20 – 2016-07-26 (×8): 10 mg via ORAL
  Filled 2016-07-20 (×8): qty 1

## 2016-07-20 MED ORDER — TAMSULOSIN HCL 0.4 MG PO CAPS
0.4000 mg | ORAL_CAPSULE | Freq: Every evening | ORAL | Status: DC
  Administered 2016-07-20 – 2016-07-26 (×7): 0.4 mg via ORAL
  Filled 2016-07-20 (×7): qty 1

## 2016-07-20 MED ORDER — INSULIN GLARGINE 100 UNIT/ML ~~LOC~~ SOLN
100.0000 [IU] | Freq: Every day | SUBCUTANEOUS | Status: DC
  Administered 2016-07-20 – 2016-07-27 (×8): 100 [IU] via SUBCUTANEOUS
  Filled 2016-07-20 (×9): qty 1

## 2016-07-20 MED ORDER — PERFLUTREN LIPID MICROSPHERE
1.0000 mL | INTRAVENOUS | Status: AC | PRN
  Administered 2016-07-20 (×2): 1 mL via INTRAVENOUS
  Administered 2016-07-20: 2 mL via INTRAVENOUS
  Filled 2016-07-20: qty 10

## 2016-07-20 MED ORDER — POLYETHYLENE GLYCOL 3350 17 G PO PACK
17.0000 g | PACK | Freq: Every day | ORAL | Status: DC | PRN
  Administered 2016-07-21 – 2016-07-24 (×3): 17 g via ORAL
  Filled 2016-07-20 (×4): qty 1

## 2016-07-20 MED ORDER — METOPROLOL TARTRATE 25 MG PO TABS
25.0000 mg | ORAL_TABLET | Freq: Two times a day (BID) | ORAL | Status: DC
  Administered 2016-07-20 – 2016-07-22 (×7): 25 mg via ORAL
  Filled 2016-07-20 (×7): qty 1

## 2016-07-20 MED ORDER — POLYETHYLENE GLYCOL 3350 17 GM/SCOOP PO POWD
17.0000 g | Freq: Every day | ORAL | Status: DC | PRN
  Filled 2016-07-20: qty 255

## 2016-07-20 MED ORDER — CLOPIDOGREL BISULFATE 75 MG PO TABS
75.0000 mg | ORAL_TABLET | Freq: Every day | ORAL | Status: DC
  Administered 2016-07-20 – 2016-07-27 (×8): 75 mg via ORAL
  Filled 2016-07-20 (×8): qty 1

## 2016-07-20 MED ORDER — VITAMIN D (ERGOCALCIFEROL) 1.25 MG (50000 UNIT) PO CAPS
50000.0000 [IU] | ORAL_CAPSULE | ORAL | Status: DC
  Administered 2016-07-22: 50000 [IU] via ORAL
  Filled 2016-07-20 (×2): qty 1

## 2016-07-20 MED ORDER — NITROGLYCERIN 2 % TD OINT
1.0000 [in_us] | TOPICAL_OINTMENT | Freq: Four times a day (QID) | TRANSDERMAL | Status: DC
  Administered 2016-07-20 – 2016-07-21 (×6): 1 [in_us] via TOPICAL
  Filled 2016-07-20 (×6): qty 1

## 2016-07-20 MED ORDER — ALPRAZOLAM 1 MG PO TABS
1.0000 mg | ORAL_TABLET | Freq: Every day | ORAL | Status: DC
  Administered 2016-07-20 – 2016-07-26 (×8): 1 mg via ORAL
  Filled 2016-07-20 (×8): qty 1

## 2016-07-20 NOTE — Progress Notes (Addendum)
CRITICAL VALUE ALERT  Critical value received:  Troponin 0.13  Date of notification:  07/20/16  Time of notification:  0220  Critical value read back:Yes.    Nurse who received alert:  Maryruth Bun  MD notified (1st page):  Georgiann Mohs  Time of first page:  0224  MD notified (2nd page):  Time of second page:  Responding MD:  Georgiann Mohs  Time MD responded: [phone]

## 2016-07-20 NOTE — Progress Notes (Signed)
Patient seen. Database reviewed. Here with CP and CHF. Continue diuresis. Ischemic evaluation prior to DC. Cards on board. Will continue to follow.  Domingo Mend, MD [facility] Pager: [phone]

## 2016-07-20 NOTE — Progress Notes (Signed)
*  PRELIMINARY RESULTS* Echocardiogram 2D Echocardiogram has been performed.  Samuel Germany 07/20/2016, 11:28 AM

## 2016-07-20 NOTE — Consult Note (Signed)
CARDIOLOGY CONSULT NOTE   Patient ID: [name] MRN: [id number] DOB/AGE: 81-Nov-1935 81 y.o.  Admit Date: 07/19/2016 Referring Physician: Jerilee Hoh, MD Primary Physician: [name], MD Consulting Cardiologist: Bronson Ing, MD Primary Cardiologist:  [name], MD Reason for Consultation: Chest pain CHF  Clinical Summary [name] is a 83 y.o. male who is being seen today for the evaluation of chest pain at the request of [facility], Dr. Jerilee Hoh. The patient has a history of CAD, with CABG X 4, DES to SVG to RCA (2013), DES to SVG to PDA (2014), COPD, hypertension, anxiety and depression.    The patient states that he did not take his lasix for at least 3-4 days, as his wife usually helps him with medication. His wife was admitted to [facility] and had cardiac cath, with complications of large groin hematoma, and has been recovering at home, but unable to help care for the patient due to her own health concerns. He admits to not taking metolazone QOD as well. He is uncertain of dietary non-compliance concerning salt as he states they get meals on wheels at home, and family members have been helping with meals.    He reports chest pressure on the left which occurred when he was walking in his home yesterday afternoon. He uses a walker to ambulate. The pressure radiated to his left arm. He uses NTG spray, x 3. Pain continued to bother him and become worse. He asked family member to bring him to the hospital. He has chronic chest pain and is on nitrates, but the pressure was worse than he had been experiencing in the past. On route chest pressure radiated to his right jaw.   He presented to the ER with complaints of chest pain. On arrival BP 180/76, HR 93., O2 Sat 90%. Temp 99.3. Pertinent labs include glucose of 235, Creatinine of 1.59, BNP 227.  Troponin 0.13;0.12 respectively. EKG revealed NSR with PAC's. CXR did not reveal over CHF, or pneumonia, but mild chronic vascular congestion. He was  treated with SL NTG, morphine and lasix. He has diuresed 2.2 liters since admission. Weight at last office visit with Dr. [name], 271 lbs, weight on admission 287 lbs. Remains on lasix 40 mg IV BID.   Allergies  Allergen Reactions  . Zocor [Simvastatin] Other (See Comments)    fatigue  . Lipitor [Atorvastatin] Other (See Comments)    Muscle cramping    Medications Scheduled Medications: . alprazolam  1 mg Oral QHS  . aspirin EC  81 mg Oral q morning - 10a  . clopidogrel  75 mg Oral Daily  . cyclobenzaprine  10 mg Oral QHS  . diltiazem  240 mg Oral Daily  . enoxaparin (LOVENOX) injection  40 mg Subcutaneous Q24H  . furosemide  40 mg Intravenous Q12H  . insulin aspart  0-9 Units Subcutaneous TID WC  . insulin glargine  100 Units Subcutaneous Daily  . metoprolol tartrate  25 mg Oral BID  . nitroGLYCERIN  1 inch Topical Q6H  . tamsulosin  0.4 mg Oral QPM  . [START ON 07/22/2016] Vitamin D (Ergocalciferol)  50,000 Units Oral Q Wed     Infusions:   PRN Medications:  acetaminophen, morphine injection, ondansetron (ZOFRAN) IV, polyethylene glycol   Past Medical History:  Diagnosis Date  . Allergic rhinitis   . Anxiety   . Arthritis   . COPD (chronic obstructive pulmonary disease) ([place])   . Coronary atherosclerosis of native coronary artery    a. CABG x  4 in 1994. Previous stent placement to SVG to distal RCA;  b. DES to native Cx in 2006;  c. DES SVG to RCA 05/08/11; d. Inf MI/Cath/PCI: LM 25, LAD 100, LCX patent stent, 50 into OM, RI 60-70, RCA 100, LIMA->LAD nl, VG->Diag 100 old, VG->RCA/PL 99 (DES x 1), EF 50-55%.  . Depression    With anxious features/hx panic attacks  . Essential hypertension, benign   . GERD (gastroesophageal reflux disease)   . Hyperlipidemia    Not on statin 2/2 hx of intolerance.   . Morbid obesity ([place])   . Nephrolithiasis   . Pneumonia   . Sleep apnea   . ST elevation myocardial infarction (STEMI) of inferior wall ([place])    4/14 - DES SVG to  PDA  . Type 2 diabetes mellitus ([place])     Past Surgical History:  Procedure Laterality Date  . CORONARY ARTERY BYPASS GRAFT  1994  . EYE SURGERY    . LEFT HEART CATHETERIZATION WITH CORONARY ANGIOGRAM N/A 05/08/2011   Procedure: LEFT HEART CATHETERIZATION WITH CORONARY ANGIOGRAM;  Surgeon: [name], MD;  Location: [facility] CATH LAB;  Service: Cardiovascular;  Laterality: N/A;  . LEFT HEART CATHETERIZATION WITH CORONARY ANGIOGRAM N/A 08/08/2012   Procedure: LEFT HEART CATHETERIZATION WITH CORONARY ANGIOGRAM;  Surgeon: [name], MD;  Location: [facility] CATH LAB;  Service: Cardiovascular;  Laterality: N/A;  . PERCUTANEOUS CORONARY STENT INTERVENTION (PCI-S) Right 05/08/2011   Procedure: PERCUTANEOUS CORONARY STENT INTERVENTION (PCI-S);  Surgeon: [name], MD;  Location: [facility] CATH LAB;  Service: Cardiovascular;  Laterality: Right;  . PERCUTANEOUS CORONARY STENT INTERVENTION (PCI-S) N/A 08/08/2012   Procedure: PERCUTANEOUS CORONARY STENT INTERVENTION (PCI-S);  Surgeon: [name], MD;  Location: [facility] CATH LAB;  Service: Cardiovascular;  Laterality: N/A;    Family History  Problem Relation Age of Onset  . Heart attack Mother     Age 42    Social History Mr. Azimi reports that he has never smoked. He has never used smokeless tobacco. Mr. Batty reports that he does not drink alcohol.  Review of Systems Complete review of systems are found to be negative unless outlined in H&P above.  Physical Examination Blood pressure (!) 168/66, pulse 71, temperature 98.6 F (37 C), temperature source Oral, resp. rate 18, height 5\' 4"  (1.626 m), weight 283 lb 9.6 oz (128.6 kg), SpO2 91 %.  Intake/Output Summary (Last 24 hours) at 07/20/16 0801 Last data filed at 07/20/16 0618  Gross per 24 hour  Intake              250 ml  Output             2500 ml  Net            -2250 ml    Telemetry: NSR rates in the 70's.   GEN: Morbidly obese male, in no acute distress.  HEENT:  Conjunctiva and lids normal, oropharynx clear with moist mucosa. Neck: Supple, no elevated JVP or carotid bruits, no thyromegaly. Neck obese Lungs: Crackles in the bases, diminished slightly in the right base. No wheezes or coughing. Wearing O2. . Cardiac: Regular rate and rhythm, no S3 or significant systolic murmur, no pericardial rub. Abdomen: Mild distention,  nontender, no hepatomegaly, bowel sounds present, no guarding or rebound. Extremities: Bilateral pitting edema to the knees.  Skin: Warm and dry. Musculoskeletal: No kyphosis. Neuropsychiatric: Alert and oriented x3, affect grossly appropriate.  Prior Cardiac Testing/Procedures Echocardiogram 05/26/2014    Left ventricle: The  cavity size was normal. Wall thickness was increased in a pattern of mild LVH. Systolic function was normal. The estimated ejection fraction was in the range of 60% to 65%. Wall motion was normal; there were no regional wall motion abnormalities. Doppler parameters are consistent with abnormal left ventricular relaxation (grade 1 diastolic dysfunction). no obvious valvular disease although the valves were not seen well.   Cardiac Catheterization;  08/08/2012 Angiographic Findings:  Left mainstem: Long mid and distal calcified 25% stenosis.   Left anterior descending (LAD): Diffuse proximal vessel disease. 100% occlusion in the mid vessel. Heavy calcification. The distal vessel fills via the LIMA and appears to be free of high-grade disease. There is a diagonal that is occluded and no longer fills via the vein graft.   Left circumflex (LCx): There is a ramus intermediate that is moderate-sized. It is branching. There is moderate calcification. There is diffuse 60-70% stenosis in a somewhat narrow caliber vessel which is unchanged from last cath.  The proximal AV groove has a stent with mild luminal irregularities but is otherwise widely patent. There is a step off in the caliber of the vessel  immediately after the stent with long 50% stenosis leading into a large obtuse marginal. The obtuse marginal is free of high-grade disease.   Right coronary artery (RCA): Occluded at the proximal segment. The distal vessel fills via the sequential vein graft.   Graft Anatomy:  LIMA to LAD: Widely patent  SVG to Diag:  Known to be occluded (as demonstrated in the previous catheterization)  SVG to distal RCA/PL: The proximal stents are widely patent. The mid stented segment in the body of SVG has a 99% hazy stenosis    Left ventriculography: Left ventricular systolic function is normal with LVEF 50-55%, inferoapical hypokinesis.   Impression: 1. Acute inferior STEMI secondary to thrombotic lesion in mid body of SVG to PDA, now s/p successful PTCA/DES x 1 mid body of SVG to PDA 2. Preserved LV systolic function 3. Triple vessel CAD s/p 5V CABG with 3/5 patent bypass grafts  Lab Results  Basic Metabolic Panel:  Recent Labs Lab 07/19/16 2147 07/20/16 0113  NA 137  --   K 4.8  --   CL 105  --   CO2 24  --   GLUCOSE 235*  --   BUN 30*  --   CREATININE 1.59* 1.50*  CALCIUM 9.0  --     Liver Function Tests:  Recent Labs Lab 07/19/16 2147  AST 7*  ALT 15*  ALKPHOS 47  BILITOT 0.2*  PROT 6.9  ALBUMIN 3.7    CBC:  Recent Labs Lab 07/19/16 2147 07/20/16 0113  WBC 6.7 6.4  HGB 12.9* 13.1  HCT 40.1 40.2  MCV 92.6 92.4  PLT 170 157    Cardiac Enzymes:  Recent Labs Lab 07/20/16 0113 07/20/16 0712  TROPONINI 0.13* 0.12*    BNP: 227   Radiology: Dg Chest Portable 1 View  Result Date: 07/19/2016 CLINICAL DATA:  Chest pain today radiating to left arm and jaw. EXAM: PORTABLE CHEST 1 VIEW COMPARISON:  01/17/2015 and 05/26/2014 FINDINGS: Sternotomy wires are intact. Lungs are adequately inflated demonstrate mild hazy opacification over the left base and mild prominence of the perihilar markings without significant change likely representing mild chronic vascular  congestion. Cannot exclude effusion/atelectasis or infection in the left base. Stable moderate cardiomegaly. Calcified plaque over the aortic arch. Degenerative change of the spine. IMPRESSION: Cardiomegaly with findings suggesting mild chronic vascular congestion. Stable hazy opacification  over the left base as cannot exclude effusion, atelectasis or infection. Consider follow-up PA and lateral chest radiograph for better evaluation of the left base. Electronically Signed   By: Marin Olp M.D.   On: 07/19/2016 22:31     ECG: NSR with Q-waves anterior/septal with PAC's.   Impression and Recommendations  1. Chest Pain; Mildly elevated troponin, likely related to demand ischemia. No acute ST T wave abnormalities. However, with known CAD, and symptoms worrisome for angina, will likely need further ischemic testing. Will discuss with Dr. Bronson Ing need to have cardiac cath vs Lexiscan MPI. For now, will continue DAPT, ASA, metoprolol, Allergic to statins.   2. Acute on Chronic Diastolic CHF: He has gained 16 lbs since being seen in the office in February. He admits to not taking metolazone and lasix for several days. He has not been weighing regularly as well. He depends on his wife to take care of him, and give him his medications. He states that he has been taking all other medications as directed. Creatinine 1.50 this am.  Will repeat echo. HE has diuresed 2.2 liters since admission, wt is down 4 lbs since admission.   Consider social services consult for [facility] in the setting of his current status and wife's illness decreasing her ability to take care of him.   3. Type II Diabetes: He admits to dietary noncompliance with carb modified diet. Ate cake the day of admission for wife's birthday.  4. COPD: Currently on O2 therapy.   5. Hypertension: BP remains elevated. Review of home medications has revealed that he is on diltiazem, 240 mg daily. This has been resumed on admission. Receiving  medications as I was assessing him. Will follow BP.    Signed: Phill Myron. Lawrence NP [place]  07/20/2016, 8:01 AM Co-Sign MD  The patient was seen and examined, and I agree with the history, physical exam, assessment and plan as documented above, with modifications as noted below. I have also personally reviewed all relevant documentation, old records, labs, and both radiographic and cardiovascular studies. I have also independently interpreted old and new ECG's.  83 yr old male with aforementioned cardiovascular history admitted with accelerating angina and significant weight gain (acute on chronic diastolic heart failure).   Dr. [name] increased Imdur to 60 mg q am and 30 mg q pm at 2/20 office visit, but the patient does not report much relief. He then started having chest pain yesterday when walking from the bedroom to the bathroom as detailed above.  He does not weight himself daily and thus has not taken metolazone for weight gain (up 16 lbs since 2/20 office visit).  ECG on 4/8 which I personally interpreted showed sinus rhythm with PAC's.  CXR 4/8 showed mild chronic vascular congestion.  Nuclear stress test 05/27/14 showed no ischemia with fixed inferolateral defect consistent with attenuation vs scar.  Continue IV Lasix given significant weight gain. I will review echocardiogram once completed to see if there has been an interval change in LV systolic function and/or regional wall motion.  He will need an ischemic evaluation once he is euvolemic (Lexiscan Myoview). Current troponin elevation consistent with demand ischemia.   Continue ASA, Plavix, metoprolol, and nitro paste. Statin intolerant.  Kate Sable, MD, [facility]  07/20/2016 10:50 AM

## 2016-07-20 NOTE — H&P (Signed)
TRH H&P    Patient Demographics:    [name], is a 83 y.o. male  MRN: [id number]  DOB - Jun 17, 1933  Admit Date - 07/19/2016  Referring MD/NP/PA: Dr. Roderic Palau  Outpatient Primary MD for the patient is [name], MD  Patient coming from: Home  Chief Complaint  Patient presents with  . Chest Pain      HPI:    [name]  is a 83 y.o. male, With history of CAD status post CABG 4 in 1994, with subsequent PCI and DES placement, diabetes mellitus, chronic kidney disease stage III, chronic diastolic CHF who came to the hospital with chief complaint of chest pain which started on TPN. Patient says that the pain was getting worse on exertion and he had to take sublingual nitroglycerin. His chest pain subsided after taking nitroglycerin, but again started when he walked to his backyard.  He denies shortness of breath. No nausea vomiting or diarrhea. No fever or dysuria.  In the ED, EKG showed normal sinus rhythm, left posterior fascicular block, (no change from previous EKGs,)  Patient says that he has not been taking Lasix for past 3 weeks as his wife , who used to  give him medications is currently hospitalized. His previous weight was 271 pounds and today he weighs 287 pounds. He also complains of swelling in lower extremities.   Review of systems:    In addition to the HPI above,  No Fever-chills, No Headache, No changes with Vision or hearing, No problems swallowing food or Liquids, No Abdominal pain, No Nausea or Vomiting, bowel movements are regular, No Blood in stool or Urine, No dysuria, No new skin rashes or bruises, No new joints pains-aches,  No new weakness, tingling, numbness in any extremity, No recent weight gain or loss, No polyuria, polydypsia or polyphagia, No significant Mental Stressors.  A full 10 point Review of Systems was done, except as stated above, all other Review of  Systems were negative.   With Past History of the following :    Past Medical History:  Diagnosis Date  . Allergic rhinitis   . Anxiety   . Arthritis   . COPD (chronic obstructive pulmonary disease) ([place])   . Coronary atherosclerosis of native coronary artery    a. CABG x 4 in 1994. Previous stent placement to SVG to distal RCA;  b. DES to native Cx in 2006;  c. DES SVG to RCA 05/08/11; d. Inf MI/Cath/PCI: LM 25, LAD 100, LCX patent stent, 50 into OM, RI 60-70, RCA 100, LIMA->LAD nl, VG->Diag 100 old, VG->RCA/PL 99 (DES x 1), EF 50-55%.  . Depression    With anxious features/hx panic attacks  . Essential hypertension, benign   . GERD (gastroesophageal reflux disease)   . Hyperlipidemia    Not on statin 2/2 hx of intolerance.   . Morbid obesity ([place])   . Nephrolithiasis   . Pneumonia   . Sleep apnea   . ST elevation myocardial infarction (STEMI) of inferior wall ([place])    4/14 - DES SVG to  PDA  . Type 2 diabetes mellitus ([place])       Past Surgical History:  Procedure Laterality Date  . CORONARY ARTERY BYPASS GRAFT  1994  . EYE SURGERY    . LEFT HEART CATHETERIZATION WITH CORONARY ANGIOGRAM N/A 05/08/2011   Procedure: LEFT HEART CATHETERIZATION WITH CORONARY ANGIOGRAM;  Surgeon: [name], MD;  Location: [facility] CATH LAB;  Service: Cardiovascular;  Laterality: N/A;  . LEFT HEART CATHETERIZATION WITH CORONARY ANGIOGRAM N/A 08/08/2012   Procedure: LEFT HEART CATHETERIZATION WITH CORONARY ANGIOGRAM;  Surgeon: [name], MD;  Location: [facility] CATH LAB;  Service: Cardiovascular;  Laterality: N/A;  . PERCUTANEOUS CORONARY STENT INTERVENTION (PCI-S) Right 05/08/2011   Procedure: PERCUTANEOUS CORONARY STENT INTERVENTION (PCI-S);  Surgeon: [name], MD;  Location: [facility] CATH LAB;  Service: Cardiovascular;  Laterality: Right;  . PERCUTANEOUS CORONARY STENT INTERVENTION (PCI-S) N/A 08/08/2012   Procedure: PERCUTANEOUS CORONARY STENT INTERVENTION (PCI-S);  Surgeon: [name], MD;  Location: [facility] CATH LAB;  Service: Cardiovascular;  Laterality: N/A;      Social History:      Social History  Substance Use Topics  . Smoking status: Never Smoker  . Smokeless tobacco: Never Used  . Alcohol use No       Family History :     Family History  Problem Relation Age of Onset  . Heart attack Mother     Age 48      Home Medications:   Prior to Admission medications   Medication Sig Start Date End Date Taking? Authorizing Provider  allopurinol (ZYLOPRIM) 100 MG tablet Take 1 tablet (100 mg total) by mouth daily. 05/28/14  Yes Brett Canales, PA-C  alprazolam Duanne Moron) 2 MG tablet Take 1 mg by mouth at bedtime.    Yes Historical Provider, MD  aspirin EC 81 MG tablet Take 81 mg by mouth every morning.   Yes Historical Provider, MD  clopidogrel (PLAVIX) 75 MG tablet take 1 tablet by mouth once daily WITH BREAKFAST 07/09/16  Yes Satira Sark, MD  cyclobenzaprine (FLEXERIL) 10 MG tablet Take 10 mg by mouth at bedtime.    Yes Historical Provider, MD  diltiazem (CARDIZEM CD) 240 MG 24 hr capsule Take 240 mg by mouth daily.   Yes Historical Provider, MD  furosemide (LASIX) 40 MG tablet Take 2 tablets (80 mg total) by mouth daily. 10/04/15  Yes Satira Sark, MD  isosorbide mononitrate (IMDUR) 60 MG 24 hr tablet 60mg  1 tab in the morning & 1/2 tablet in the evening 06/02/16  Yes Satira Sark, MD  metolazone (ZAROXOLYN) 2.5 MG tablet 01/29/16 change in directions Take one day for 2-3 days as needed only for weight gain of 3-5 lbs in a 48 hour period. 01/29/16  Yes Satira Sark, MD  metoprolol tartrate (LOPRESSOR) 25 MG tablet Take 25 mg by mouth 2 (two) times daily.   Yes Historical Provider, MD  nitroGLYCERIN (NITROLINGUAL) 0.4 MG/SPRAY spray use 1-2 sprays under the tongue if needed for chest pain *MAXIMUM OF 3 SPRAYS IN 15 MINUTES 07/09/16  Yes Satira Sark, MD  psyllium (METAMUCIL) 58.6 % powder Take by mouth every morning.   Yes Historical  Provider, MD  tamsulosin (FLOMAX) 0.4 MG CAPS capsule Take 0.4 mg by mouth every evening.  01/11/13  Yes Historical Provider, MD  TOUJEO SOLOSTAR 300 UNIT/ML SOPN inject 100 units subcutaneously once daily 12/18/14  Yes Historical Provider, MD  Vitamin D, Ergocalciferol, (DRISDOL) 50000 UNITS CAPS capsule Take 50,000 Units  by mouth every Wednesday. 04/18/14  Yes Historical Provider, MD  polyethylene glycol powder (GLYCOLAX/MIRALAX) powder Take 17 g by mouth daily as needed (constipation).  02/08/13   Historical Provider, MD     Allergies:     Allergies  Allergen Reactions  . Zocor [Simvastatin] Other (See Comments)    fatigue  . Lipitor [Atorvastatin] Other (See Comments)    Muscle cramping     Physical Exam:   Vitals  Blood pressure (!) 172/80, pulse 78, temperature 99.3 F (37.4 C), temperature source Oral, resp. rate (!) 21, height 5\' 4"  (1.626 m), weight 130.5 kg (287 lb 12.8 oz), SpO2 95 %.  1.  General: Appears in no acute distress  2. Psychiatric:  Intact judgement and  insight, awake alert, oriented x 3.  3. Neurologic: No focal neurological deficits, all cranial nerves intact.Strength 5/5 all 4 extremities, sensation intact all 4 extremities, plantars down going.  4. Eyes :  anicteric sclerae, moist conjunctivae with no lid lag. PERRLA.  5. ENMT:  Oropharynx clear with moist mucous membranes and good dentition  6. Neck:  supple, no cervical lymphadenopathy appriciated, No thyromegaly  7. Respiratory : Normal respiratory effort, scattered wheezing bilaterally at lung bases  8. Cardiovascular : RRR, no gallops, rubs or murmurs, bilateral 2+ pitting edema  9. Gastrointestinal:  Positive bowel sounds, abdomen soft, non-tender to palpation,no hepatosplenomegaly, no rigidity or guarding       10. Skin:  No cyanosis, normal texture and turgor, no rash, lesions or ulcers  11.Musculoskeletal:  Good muscle tone,  joints appear normal , no effusions,  normal range of  motion    Data Review:    CBC  Recent Labs Lab 07/19/16 2147  WBC 6.7  HGB 12.9*  HCT 40.1  PLT 170  MCV 92.6  MCH 29.8  MCHC 32.2  RDW 15.2   ------------------------------------------------------------------------------------------------------------------  Chemistries   Recent Labs Lab 07/19/16 2147  NA 137  K 4.8  CL 105  CO2 24  GLUCOSE 235*  BUN 30*  CREATININE 1.59*  CALCIUM 9.0  AST 7*  ALT 15*  ALKPHOS 47  BILITOT 0.2*   ------------------------------------------------------------------------------------------------------------------  ------------------------------------------------------------------------------------------------------------------ GFR: Estimated Creatinine Clearance: 45.2 mL/min (A) (by C-G formula based on SCr of 1.59 mg/dL (H)). Liver Function Tests:  Recent Labs Lab 07/19/16 2147  AST 7*  ALT 15*  ALKPHOS 47  BILITOT 0.2*  PROT 6.9  ALBUMIN 3.7   CBG:  Recent Labs Lab 07/20/16 0020  GLUCAP 154*      Imaging Results:    Dg Chest Portable 1 View  Result Date: 07/19/2016 CLINICAL DATA:  Chest pain today radiating to left arm and jaw. EXAM: PORTABLE CHEST 1 VIEW COMPARISON:  01/17/2015 and 05/26/2014 FINDINGS: Sternotomy wires are intact. Lungs are adequately inflated demonstrate mild hazy opacification over the left base and mild prominence of the perihilar markings without significant change likely representing mild chronic vascular congestion. Cannot exclude effusion/atelectasis or infection in the left base. Stable moderate cardiomegaly. Calcified plaque over the aortic arch. Degenerative change of the spine. IMPRESSION: Cardiomegaly with findings suggesting mild chronic vascular congestion. Stable hazy opacification over the left base as cannot exclude effusion, atelectasis or infection. Consider follow-up PA and lateral chest radiograph for better evaluation of the left base. Electronically Signed   By: Marin Olp  M.D.   On: 07/19/2016 22:31    My personal review of EKG: Rhythm NSR,Left posterior fascicular block   Assessment & Plan:    Active Problems:  Coronary atherosclerosis of native coronary artery   Essential hypertension, benign   CAD (coronary artery disease) of artery bypass graft   Chronic diastolic heart failure (HCC)   CKD (chronic kidney disease) stage 3, GFR 30-59 ml/min   Chest pain   1. Chest pain/unstable angina- appears typical as patient pain relieved with nitroglycerin, EKG shows no significant changes. Chest pain is resolved after patient received nitroglycerin in the ED. Will start Nitropaste 15 mg every 6 hours, morphine 2 mg IV every 4 hours when necessary for pain, will cycle cardiac enzymes. 2. Acute on chronic diastolic CHF- chest x-ray shows pulmonary vascular congestion, he also has bilateral lower extremity edema. He is rate has gone from 271 pounds to 287 pounds. Patient received Lasix 40 mg IV in the ED. We'll start Lasix 40 mg IV every 12 hours from tomorrow morning. [facility] consult cardiology for further recommendations. Will insert Foley catheter for strict intake and output. 3. Diabetes mellitus- continue Lantus 100 units subcutaneous daily, will start sliding scale insulin with NovoLog. 4. Chronic kidney disease stage III-at baseline, patient's creatinine is 1.59. 5. Hypertension- continue metoprolol, Cardizem. 6. CAD status post CABG- continue aspirin, Plavix .Will hold Imdur as patient started on Nitropaste.     DVT Prophylaxis-   Lovenox   AM Labs Ordered, also please review Full Orders  Family Communication: Admission, patients condition and plan of care including tests being ordered have been discussed with the patient and his son at bedside who indicate understanding and agree with the plan and Code Status.  Code Status:  DO NOT RESUSCITATE  Admission status: Observation    Time spent in minutes : 60 minutes   Treasa Bradshaw S M.D on 07/20/2016 at 1:05  AM  Between 7am to 7pm - Pager - [phone]. After 7pm go to [email or web address] - password [facility]  [facility] - Office  [phone]

## 2016-07-21 DIAGNOSIS — I208 Other forms of angina pectoris: Secondary | ICD-10-CM

## 2016-07-21 LAB — GLUCOSE, CAPILLARY
GLUCOSE-CAPILLARY: 167 mg/dL — AB (ref 65–99)
Glucose-Capillary: 75 mg/dL (ref 65–99)
Glucose-Capillary: 195 mg/dL — ABNORMAL HIGH (ref 65–99)
Glucose-Capillary: 215 mg/dL — ABNORMAL HIGH (ref 65–99)

## 2016-07-21 LAB — HEMOGLOBIN A1C
HEMOGLOBIN A1C: 7.6 % — AB (ref 4.8–5.6)
Mean Plasma Glucose: 171 mg/dL

## 2016-07-21 MED ORDER — HYDRALAZINE HCL 25 MG PO TABS
25.0000 mg | ORAL_TABLET | Freq: Four times a day (QID) | ORAL | Status: DC | PRN
  Administered 2016-07-21 (×2): 25 mg via ORAL
  Filled 2016-07-21 (×3): qty 1

## 2016-07-21 MED ORDER — ORAL CARE MOUTH RINSE
15.0000 mL | Freq: Two times a day (BID) | OROMUCOSAL | Status: DC
  Administered 2016-07-21 – 2016-07-27 (×12): 15 mL via OROMUCOSAL

## 2016-07-21 MED ORDER — ISOSORBIDE MONONITRATE ER 60 MG PO TB24
60.0000 mg | ORAL_TABLET | Freq: Every morning | ORAL | Status: DC
  Administered 2016-07-21 – 2016-07-27 (×7): 60 mg via ORAL
  Filled 2016-07-21 (×7): qty 1

## 2016-07-21 MED ORDER — ALPRAZOLAM 0.5 MG PO TABS
0.5000 mg | ORAL_TABLET | Freq: Once | ORAL | Status: AC
  Administered 2016-07-21: 0.5 mg via ORAL
  Filled 2016-07-21: qty 1

## 2016-07-21 MED ORDER — ISOSORBIDE MONONITRATE ER 60 MG PO TB24
30.0000 mg | ORAL_TABLET | Freq: Every evening | ORAL | Status: DC
  Administered 2016-07-21 – 2016-07-26 (×6): 30 mg via ORAL
  Filled 2016-07-21 (×6): qty 1

## 2016-07-21 NOTE — Progress Notes (Signed)
Dr. Darrick Meigs text paged with notification of BP 186/78 in RUE, BP 199/68 in LUE, and HR 72. Patient denies chest pain. Resting in bed at this time. No distress noted.

## 2016-07-21 NOTE — Progress Notes (Signed)
Progress Note  Patient Name: [name] Date of Encounter: 07/21/2016  Primary Cardiologist: [name]  Subjective   No chest pain at rest, but notes angina when he walks (worse than baseline). Breathing still short. Also reports gout in his toe.  Inpatient Medications    Scheduled Meds: . allopurinol  100 mg Oral Daily  . alprazolam  1 mg Oral QHS  . aspirin EC  81 mg Oral q morning - 10a  . clopidogrel  75 mg Oral Daily  . cyclobenzaprine  10 mg Oral QHS  . diltiazem  240 mg Oral Daily  . enoxaparin (LOVENOX) injection  40 mg Subcutaneous Q24H  . furosemide  40 mg Intravenous Q12H  . insulin aspart  0-9 Units Subcutaneous TID WC  . insulin glargine  100 Units Subcutaneous Daily  . mouth rinse  15 mL Mouth Rinse BID  . metoprolol tartrate  25 mg Oral BID  . nitroGLYCERIN  1 inch Topical Q6H  . tamsulosin  0.4 mg Oral QPM  . [START ON 07/22/2016] Vitamin D (Ergocalciferol)  50,000 Units Oral Q Wed   Continuous Infusions:  PRN Meds: acetaminophen, hydrALAZINE, morphine injection, ondansetron (ZOFRAN) IV, polyethylene glycol   Vital Signs    Vitals:   07/21/16 0458 07/21/16 0541 07/21/16 0544 07/21/16 0637  BP:  (!) 186/78 (!) 199/68 (!) 180/64  Pulse:  76  74  Resp:  18    Temp:  98.6 F (37 C)    TempSrc:  Oral    SpO2:      Weight: 280 lb 1.6 oz (127.1 kg)     Height:        Intake/Output Summary (Last 24 hours) at 07/21/16 0910 Last data filed at 07/21/16 0458  Gross per 24 hour  Intake              600 ml  Output             6250 ml  Net            -5650 ml   Filed Weights   07/19/16 2354 07/20/16 0500 07/21/16 0458  Weight: 287 lb 12.8 oz (130.5 kg) 283 lb 9.6 oz (128.6 kg) 280 lb 1.6 oz (127.1 kg)    Telemetry    NSR PACs - Personally Reviewed   Physical Exam   GEN: No acute distress, morbidly obese HEENT: Normocephalic, atraumatic, sclera non-icteric. Neck: No JVD or bruits. Cardiac: RRR no murmurs, rubs, or gallops.    Radials/DP/PT 1+ and equal bilaterally.  Respiratory: Diminished bases, otherwise clear to auscultation bilaterally. Breathing is unlabored. GI: Soft, nontender, non-distended, BS +x 4. MS: no deformity. Dry skin Extremities: No clubbing or cyanosis. Bilateral 1+ LE edema. Distal pedal pulses are 2+ and equal bilaterally. Neuro:  AAOx3. Follows commands. Psych:  Responds to questions appropriately with a normal affect.  Labs    Chemistry Recent Labs Lab 07/19/16 2147 07/20/16 0113  NA 137  --   K 4.8  --   CL 105  --   CO2 24  --   GLUCOSE 235*  --   BUN 30*  --   CREATININE 1.59* 1.50*  CALCIUM 9.0  --   PROT 6.9  --   ALBUMIN 3.7  --   AST 7*  --   ALT 15*  --   ALKPHOS 47  --   BILITOT 0.2*  --   GFRNONAA 39* 42*  GFRAA 45* 49*  ANIONGAP 8  --  Hematology Recent Labs Lab 07/19/16 2147 07/20/16 0113  WBC 6.7 6.4  RBC 4.33 4.35  HGB 12.9* 13.1  HCT 40.1 40.2  MCV 92.6 92.4  MCH 29.8 30.1  MCHC 32.2 32.6  RDW 15.2 15.1  PLT 170 157    Cardiac Enzymes Recent Labs Lab 07/20/16 0113 07/20/16 0712 07/20/16 1353 07/20/16 1837  TROPONINI 0.13* 0.12* 0.08* 0.07*    Recent Labs Lab 07/19/16 2155  TROPIPOC 0.05     BNP Recent Labs Lab 07/19/16 2147  BNP 227.0*     DDimer No results for input(s): DDIMER in the last 168 hours.   Radiology    Dg Chest Portable 1 View  Result Date: 07/19/2016 CLINICAL DATA:  Chest pain today radiating to left arm and jaw. EXAM: PORTABLE CHEST 1 VIEW COMPARISON:  01/17/2015 and 05/26/2014 FINDINGS: Sternotomy wires are intact. Lungs are adequately inflated demonstrate mild hazy opacification over the left base and mild prominence of the perihilar markings without significant change likely representing mild chronic vascular congestion. Cannot exclude effusion/atelectasis or infection in the left base. Stable moderate cardiomegaly. Calcified plaque over the aortic arch. Degenerative change of the spine. IMPRESSION:  Cardiomegaly with findings suggesting mild chronic vascular congestion. Stable hazy opacification over the left base as cannot exclude effusion, atelectasis or infection. Consider follow-up PA and lateral chest radiograph for better evaluation of the left base. Electronically Signed   By: Marin Olp M.D.   On: 07/19/2016 22:31    Cardiac Studies   2D echo 07/20/16: - Procedure narrative: Transthoracic echocardiography. Image   quality was fair. Contrast enhancement utilized. - Left ventricle: The cavity size was normal. There was moderate   concentric hypertrophy. Systolic function was normal. The   estimated ejection fraction was in the range of 50% to 55%.   Doppler parameters are consistent with abnormal left ventricular   relaxation (grade 1 diastolic dysfunction). Doppler parameters   are consistent with high ventricular filling pressure. - Regional wall motion abnormality: Hypokinesis of the mid   anterior, mid anteroseptal, and mid inferoseptal myocardium. - Aortic valve: Trileaflet; mildly calcified leaflets. - Mitral valve: Calcified annulus. Mildly calcified leaflets . - Left atrium: The atrium was mildly dilated. - Right ventricle: Systolic function was mildly reduced.  Patient Profile     83 y.o. male with CAD (CABGx4 in 1994, prior stent to [place], DES to native Cx 2006, DES to SVG-RCA 04/2011, STEMI s/p DES to SVG to PDA 2014, NSTEMI 05/2014 with nonischemic nuc treated medically), chronic angina, HTN, anxiety, depression, GERD, HLD (with history of statin intolerance), morbid obesity, OSA, DM, chronic diastolic CHF, suspected CKD III, admitted with worsened chest pain and weight gain. + recent med noncompliance and possible dietary noncompliance in setting of his wife (caregiver) recently having her own health issues.  2D Echo 07/20/16: EF 50-55%, grade 1 DD, +WMA, mildly reduced RV function.  Assessment & Plan    1. Acute on chronic angina with known complex CAD - angina may  be exacerbated by fluid accumulation. Continue aspirin, Plavix. Consider titration of beta blocker and changing nitro paste to Imdur today given high BP. Prior Imdur dose was 60QAM/30QPM. Per Dr. Raliegh Ip, he will need an ischemic evaluation once he is euvolemic (Lexiscan Myoview). Current troponin elevation consistent with demand ischemia.    2.  Acute on chronic diastolic CHF - admit weight 287 (prior office weight 271lb). Down to 280 today. Would continue current IV Lasix. Agree with recommendation for [place] at discharge given his complicated social  situation recently. LV function grossly preserved - most recent echo for comparison in 01/2015 was technically difficult so there may be mild decrease in EF compared to prior.  3. Essential HTN - see above re: possible med titration for angina.  4. CKD stage III - Dr. [name] references recent OP Cr of 1.9. Previously in the 2 range in 2016. Will order daily BMET. Suspect Cr may have to run higher to achieve euvolemia.  Signed, Charlie Pitter, PA-C  07/21/2016, 9:10 AM    The patient was seen and examined, and I agree with the history, physical exam, assessment and plan as documented above, with modifications as noted below. I have also personally reviewed all relevant documentation and labs. Continues to complain of shortness of breath and blames himself, in spite of >7.6 L output in last 48 hrs. Also has exertional angina. Echo showed LVEF 50-55% with wall motion abnormalities.  I will switch nitro paste to Imdur 60 mg q am and 30 mg q pm as per outpatient regimen. Plan for Lexiscan Myoview once euvolemic. Continue IV Lasix. Continue ASA, Plavix, metoprolol, and nitro paste. Statin intolerant.  Kate Sable, MD, [facility]  07/21/2016 9:44 AM

## 2016-07-21 NOTE — Plan of Care (Signed)
Problem: Safety: Goal: Ability to remain free from injury will improve Outcome: Progressing Pt a high fall risk. Pt is a two person assist from bed to chair. Educate pt on safety and being a high fall risk. Bed alarm on and call bell within reach.   Problem: Pain Managment: Goal: General experience of comfort will improve Outcome: Not Progressing Pt complains of pain in the right foot. States he got off track with his medications which is how he ended up in the hospital. Pt trying to establish pain control in the right foot.   Problem: Tissue Perfusion: Goal: Risk factors for ineffective tissue perfusion will decrease Outcome: Progressing Pt VTE is Lovenox.  Problem: Fluid Volume: Goal: Ability to maintain a balanced intake and output will improve Outcome: Progressing Monitoring pt's intake and output (strict)

## 2016-07-21 NOTE — Progress Notes (Signed)
PROGRESS NOTE    [name]  RXV:[id number] DOB: Sep 12, 1933 DOA: 07/19/2016 PCP: [name], MD     Brief Narrative:  83 y/o man admitted 4/9 from home with CP. Found to have acute on chronic angina with known CAD and acute on chronic diastolic CHF. Admission requested.   Assessment & Plan:   Active Problems:   Coronary atherosclerosis of native coronary artery   Essential hypertension, benign   CAD (coronary artery disease) of artery bypass graft   Chronic diastolic heart failure (HCC)   CKD (chronic kidney disease) stage 3, GFR 30-59 ml/min   Chest pain   Angina with known complex CAD/Demand Ischemia -Still SOB with ambulation. -Cards on board and is planning a myoview once euvolemic.  Acute on Chronic Diastolic CHF -is 7.6 L negative since admission, still hypervolemic based on clinical finding. -Continue IV lasix.  Morbid Obesity -noted. -Counseled on diet and exercise.  IDDM -Fair control. -Continue current management.  HTN -Remains hypertensive. -Has been placed back on Imdur, which should also help with his anginal symptoms  CKD Stage III -Cr is at baseline of around 1.5-1.6.   DVT prophylaxis: lovenox Code Status: DNR Family Communication: patient only Disposition Plan: pending clinical improvement  Consultants:   Cardiology  Procedures:   None  Antimicrobials:  Anti-infectives    None       Subjective: Has exertional dyspnea  Objective: Vitals:   07/21/16 0544 07/21/16 0637 07/21/16 0949 07/21/16 1136  BP: (!) 199/68 (!) 180/64 (!) 182/105 (!) 167/60  Pulse:  74    Resp:      Temp:      TempSrc:      SpO2:      Weight:      Height:        Intake/Output Summary (Last 24 hours) at 07/21/16 1702 Last data filed at 07/21/16 1132  Gross per 24 hour  Intake              360 ml  Output             6550 ml  Net            -6190 ml   Filed Weights   07/19/16 2354 07/20/16 0500 07/21/16 0458  Weight: 130.5 kg (287 lb  12.8 oz) 128.6 kg (283 lb 9.6 oz) 127.1 kg (280 lb 1.6 oz)    Examination:  General exam: Alert, awake, oriented x 3, obese Respiratory system: Clear to auscultation. Respiratory effort normal. Cardiovascular system:RRR. No murmurs, rubs, gallops. Gastrointestinal system: Abdomen is nondistended, soft and nontender. No organomegaly or masses felt. Normal bowel sounds heard. Central nervous system: Alert and oriented. No focal neurological deficits. Extremities: No C/C/E, +pedal pulses Skin: No rashes, lesions or ulcers Psychiatry: Judgement and insight appear normal. Mood & affect appropriate.     Data Reviewed: I have personally reviewed following labs and imaging studies  CBC:  Recent Labs Lab 07/19/16 2147 07/20/16 0113  WBC 6.7 6.4  HGB 12.9* 13.1  HCT 40.1 40.2  MCV 92.6 92.4  PLT 170 509   Basic Metabolic Panel:  Recent Labs Lab 07/19/16 2147 07/20/16 0113  NA 137  --   K 4.8  --   CL 105  --   CO2 24  --   GLUCOSE 235*  --   BUN 30*  --   CREATININE 1.59* 1.50*  CALCIUM 9.0  --    GFR: Estimated Creatinine Clearance: 47.2 mL/min (A) (by C-G formula based on  SCr of 1.5 mg/dL (H)). Liver Function Tests:  Recent Labs Lab 07/19/16 2147  AST 7*  ALT 15*  ALKPHOS 47  BILITOT 0.2*  PROT 6.9  ALBUMIN 3.7   No results for input(s): LIPASE, AMYLASE in the last 168 hours. No results for input(s): AMMONIA in the last 168 hours. Coagulation Profile: No results for input(s): INR, PROTIME in the last 168 hours. Cardiac Enzymes:  Recent Labs Lab 07/20/16 0113 07/20/16 0712 07/20/16 1353 07/20/16 1837  TROPONINI 0.13* 0.12* 0.08* 0.07*   BNP (last 3 results) No results for input(s): PROBNP in the last 8760 hours. HbA1C:  Recent Labs  07/20/16 0113  HGBA1C 7.6*   CBG:  Recent Labs Lab 07/20/16 1612 07/20/16 2119 07/21/16 0742 07/21/16 1128 07/21/16 1649  GLUCAP 114* 124* 75 195* 167*   Lipid Profile: No results for input(s): CHOL,  HDL, LDLCALC, TRIG, CHOLHDL, LDLDIRECT in the last 72 hours. Thyroid Function Tests: No results for input(s): TSH, T4TOTAL, FREET4, T3FREE, THYROIDAB in the last 72 hours. Anemia Panel: No results for input(s): VITAMINB12, FOLATE, FERRITIN, TIBC, IRON, RETICCTPCT in the last 72 hours. Urine analysis: No results found for: COLORURINE, APPEARANCEUR, LABSPEC, PHURINE, GLUCOSEU, HGBUR, BILIRUBINUR, KETONESUR, PROTEINUR, UROBILINOGEN, NITRITE, LEUKOCYTESUR Sepsis Labs: @LABRCNTIP (procalcitonin:4,lacticidven:4)  )No results found for this or any previous visit (from the past 240 hour(s)).       Radiology Studies: Dg Chest Portable 1 View  Result Date: 07/19/2016 CLINICAL DATA:  Chest pain today radiating to left arm and jaw. EXAM: PORTABLE CHEST 1 VIEW COMPARISON:  01/17/2015 and 05/26/2014 FINDINGS: Sternotomy wires are intact. Lungs are adequately inflated demonstrate mild hazy opacification over the left base and mild prominence of the perihilar markings without significant change likely representing mild chronic vascular congestion. Cannot exclude effusion/atelectasis or infection in the left base. Stable moderate cardiomegaly. Calcified plaque over the aortic arch. Degenerative change of the spine. IMPRESSION: Cardiomegaly with findings suggesting mild chronic vascular congestion. Stable hazy opacification over the left base as cannot exclude effusion, atelectasis or infection. Consider follow-up PA and lateral chest radiograph for better evaluation of the left base. Electronically Signed   By: Marin Olp M.D.   On: 07/19/2016 22:31        Scheduled Meds: . allopurinol  100 mg Oral Daily  . alprazolam  1 mg Oral QHS  . aspirin EC  81 mg Oral q morning - 10a  . clopidogrel  75 mg Oral Daily  . cyclobenzaprine  10 mg Oral QHS  . diltiazem  240 mg Oral Daily  . enoxaparin (LOVENOX) injection  40 mg Subcutaneous Q24H  . furosemide  40 mg Intravenous Q12H  . insulin aspart  0-9 Units  Subcutaneous TID WC  . insulin glargine  100 Units Subcutaneous Daily  . isosorbide mononitrate  30 mg Oral QPM  . isosorbide mononitrate  60 mg Oral q morning - 10a  . mouth rinse  15 mL Mouth Rinse BID  . metoprolol tartrate  25 mg Oral BID  . tamsulosin  0.4 mg Oral QPM  . [START ON 07/22/2016] Vitamin D (Ergocalciferol)  50,000 Units Oral Q Wed   Continuous Infusions:   LOS: 1 day    Time spent: 25 minutes. Greater than 50% of this time was spent in direct contact with the patient coordinating care.     Lelon Frohlich, MD [facility] Pager [phone]  If 7PM-7AM, please contact night-coverage [email or web address] Password [email or web address] 07/21/2016, 5:02 PM

## 2016-07-21 NOTE — Care Management Note (Addendum)
Case Management Note  Patient Details  Name: [name] MRN: [id number] Date of Birth: 18-Jan-1934  Subjective/Objective:                  Pt admitted with CP and is being tx for CHF. He is from home, lives with his wife. He is ind with ADL's. He has PCP, drives himself to appointments. He has insurance with drug coverage and reports non-compliance with meds when he fixes them himself, however his wife usually fixes them. (Wife has been recently hospitalized and pt was having to fix his own medications for about 2 weeks). Pt has cane and walker he uses PRN. He has CPAP and supplemental oxygen at night through [employer]. He states he does not want HH as he will not pay the co-pays.   Action/Plan: Pt plans to return home with self care at DC. CM will cont to follow. Pt is eligible for [facility] services and will be referred.   Expected Discharge Date:  07/21/16               Expected Discharge Plan:  Home/Self Care  In-House Referral:  NA  Discharge planning Services  CM Consult  Post Acute Care Choice:  NA Choice offered to:  NA  Status of Service:  In process, will continue to follow  Sherald Barge, RN 07/21/2016, 2:38 PM

## 2016-07-22 DIAGNOSIS — I200 Unstable angina: Secondary | ICD-10-CM

## 2016-07-22 DIAGNOSIS — I5033 Acute on chronic diastolic (congestive) heart failure: Secondary | ICD-10-CM

## 2016-07-22 LAB — BASIC METABOLIC PANEL
Anion gap: 8 (ref 5–15)
BUN: 42 mg/dL — ABNORMAL HIGH (ref 6–20)
CO2: 30 mmol/L (ref 22–32)
CREATININE: 2.06 mg/dL — AB (ref 0.61–1.24)
Calcium: 8.2 mg/dL — ABNORMAL LOW (ref 8.9–10.3)
Chloride: 95 mmol/L — ABNORMAL LOW (ref 101–111)
GFR, EST AFRICAN AMERICAN: 33 mL/min — AB (ref >60)
GFR, EST NON AFRICAN AMERICAN: 29 mL/min — AB (ref >60)
Glucose, Bld: 114 mg/dL — ABNORMAL HIGH (ref 65–99)
Potassium: 4.3 mmol/L (ref 3.5–5.1)
Sodium: 133 mmol/L — ABNORMAL LOW (ref 135–145)

## 2016-07-22 LAB — GLUCOSE, CAPILLARY
GLUCOSE-CAPILLARY: 97 mg/dL (ref 65–99)
GLUCOSE-CAPILLARY: 161 mg/dL — AB (ref 65–99)
Glucose-Capillary: 179 mg/dL — ABNORMAL HIGH (ref 65–99)
Glucose-Capillary: 195 mg/dL — ABNORMAL HIGH (ref 65–99)

## 2016-07-22 MED ORDER — NYSTATIN 100000 UNIT/GM EX POWD
CUTANEOUS | Status: AC
  Filled 2016-07-22: qty 15

## 2016-07-22 MED ORDER — FUROSEMIDE 10 MG/ML IJ SOLN
40.0000 mg | Freq: Every day | INTRAMUSCULAR | Status: DC
  Administered 2016-07-23 – 2016-07-27 (×5): 40 mg via INTRAVENOUS
  Filled 2016-07-22 (×5): qty 4

## 2016-07-22 MED ORDER — NYSTATIN 100000 UNIT/GM EX POWD
Freq: Three times a day (TID) | CUTANEOUS | Status: DC
  Administered 2016-07-22 – 2016-07-27 (×15): via TOPICAL

## 2016-07-22 NOTE — Evaluation (Signed)
Physical Therapy Evaluation Patient Details Name: [name] MRN: [id number] DOB: 1933-05-06 Today's Date: 07/22/2016   History of Present Illness  83 y.o. male, With history of CAD status post CABG 4 in 1994, with subsequent PCI and DES placement, diabetes mellitus, chronic kidney disease stage III, chronic diastolic CHF who came to the hospital with chief complaint of chest pain which started on TPN. Patient says that the pain was getting worse on exertion and he had to take sublingual nitroglycerin. His chest pain subsided after taking nitroglycerin, but again started when he walked to his backyard.    EKG showed normal sinus rhythm, left posterior fascicular block, (no change from previous EKGs,)   Patient says that he has not been taking Lasix for past 3 weeks as his wife , who used to  give him medications is currently hospitalized. His previous weight was 271 pounds and today he weighs 287 pounds. He also complains of swelling in lower extremities.  Dx: Chest pain and unstable angina relieved with nitropaste, and acute on chronic diastolic congestive heart faillure.     Clinical Impression  Pt received sitting up in the chair, and was agreeable to PT evaluation.  Pt states that he normally uses a RW for ambulation in the house, and he is independent with dressing and bathing.  During PT evaluation he c/o pain in R foot, and has a history of gout.  Pt required Min guard for sit<>stand with walker, and min guard for gait x 37ft with walker.  Pt is recommended to return home with HHPT, however he states that last time they had home health they were not able to afford the co-pay of $25 each visit.      Follow Up Recommendations Home health PT    Equipment Recommendations  None recommended by PT    Recommendations for Other Services       Precautions / Restrictions Precautions Precautions: Fall Precaution Comments: Due to immobility, however pt states that he has not had any falls in  the past 6 months.   Restrictions Weight Bearing Restrictions: No      Mobility  Bed Mobility               General bed mobility comments: Not observed, pt already sitting up in the chair upon entering, however he has a hospital bed at home.   Transfers Overall transfer level: Needs assistance Equipment used: Standard walker (bariatric) Transfers: Sit to/from Stand Sit to Stand: Min guard         General transfer comment: Pt attempted to pull upon the walker several times, but was unable.  PT instructed on pushing up from the chair instead.    Ambulation/Gait Ambulation/Gait assistance: Min guard Ambulation Distance (Feet): 25 Feet Assistive device: Standard walker (bariatric) Gait Pattern/deviations: Step-to pattern;Wide base of support;Trunk flexed   Gait velocity interpretation: <1.8 ft/sec, indicative of risk for recurrent falls    Stairs            Wheelchair Mobility    Modified Rankin (Stroke Patients Only)       Balance Overall balance assessment: Needs assistance Sitting-balance support: Bilateral upper extremity supported;Feet supported Sitting balance-Leahy Scale: Good     Standing balance support: Bilateral upper extremity supported Standing balance-Leahy Scale: Fair                               Pertinent Vitals/Pain Pain Assessment: 0-10 Pain Location: R  foot during mobility and it goes up to ~7/10 "just a pain"  Pain Intervention(s): Limited activity within patient's tolerance;Monitored during session;Repositioned    Home Living   Living Arrangements: Spouse/significant other Available Help at Discharge: Family (granddaughter and dtr that assists with groceries.  ) Type of Home: House Home Access: Level entry     Home Layout: One level;Other (Comment) (1 step going out from the den.  ) Home Equipment: Gilford Rile - 2 wheels;Cane - single point;Bedside commode;Shower seat;Hospital bed      Prior Function Level of  Independence: Independent with assistive device(s)   Gait / Transfers Assistance Needed: Pt uses RW for ambulation in the house.  ADL's / Homemaking Assistance Needed: independent.  Wife assists with socks and shoes.    Comments: Pt states they haven't done HH due to $25 per visit.       Hand Dominance   Dominant Hand: Left    Extremity/Trunk Assessment   Upper Extremity Assessment Upper Extremity Assessment: Generalized weakness    Lower Extremity Assessment Lower Extremity Assessment: Generalized weakness       Communication   Communication: No difficulties  Cognition Arousal/Alertness: Awake/alert Behavior During Therapy: WFL for tasks assessed/performed Overall Cognitive Status: Impaired/Different from baseline Area of Impairment: Orientation                 Orientation Level: Situation;Disoriented to             General Comments: Pt stated he is here due to pain when urinating.  Pt is very tangental with conversation.        General Comments      Exercises     Assessment/Plan    PT Assessment Patient needs continued PT services  PT Problem List Decreased activity tolerance;Decreased balance;Decreased mobility;Obesity;Pain       PT Treatment Interventions DME instruction;Gait training;Functional mobility training;Therapeutic activities;Therapeutic exercise;Balance training;Patient/family education    PT Goals (Current goals can be found in the Care Plan section)  Acute Rehab PT Goals Patient Stated Goal: To go home PT Goal Formulation: With patient Time For Goal Achievement: 07/29/16 Potential to Achieve Goals: Fair    Frequency Min 3X/week   Barriers to discharge        Co-evaluation               End of Session Equipment Utilized During Treatment: Gait belt;Oxygen Activity Tolerance: Patient limited by fatigue;Patient limited by pain Patient left: in chair;with call bell/phone within reach Nurse Communication: Mobility  status (mobility sheet left up in the room. ) PT Visit Diagnosis: Muscle weakness (generalized) (M62.81);Other abnormalities of gait and mobility (R26.89)    Time: 2841-3244 PT Time Calculation (min) (ACUTE ONLY): 27 min   Charges:   PT Evaluation $PT Eval Low Complexity: 1 Procedure PT Treatments $Therapeutic Activity: 8-22 mins   PT G Codes:   PT G-Codes **NOT FOR INPATIENT CLASS** Functional Assessment Tool Used: AM-PAC 6 Clicks Basic Mobility;Clinical judgement Functional Limitation: Mobility: Walking and moving around Mobility: Walking and Moving Around Current Status (W1027): At least 40 percent but less than 60 percent impaired, limited or restricted Mobility: Walking and Moving Around Goal Status [id number]): At least 20 percent but less than 40 percent impaired, limited or restricted    Beth Daissy Yerian, PT, DPT X: [phone]

## 2016-07-22 NOTE — Progress Notes (Signed)
PROGRESS NOTE    [name]  [id number]:[id number] DOB: Jan 28, 1934 DOA: 07/19/2016 PCP: [name], MD   Brief Narrative:  83 year old male with history of CAD with CABG X4 in 1994, chronic angina, anxiety, hypertension, hyperlipidemia, GERD, morbid obesity, diabetes mellitus, diastolic CHF, CKG stage III came with the complaints of chest pain and weight gain. He was found to be in acute on chronic diastolic CHF. Echocardiogram done on 07/20/2016 showed ejection fraction of 50-55 percent with grade 1 diastolic dysfunction with mild reduction of RV function. He has been treated with IV Lasix. As noted to have elevated renal function.   Assessment & Plan:   Active Problems:   Coronary atherosclerosis of native coronary artery   Essential hypertension, benign   CAD (coronary artery disease) of artery bypass graft   Chronic diastolic heart failure (HCC)   CKD (chronic kidney disease) stage 3, GFR 30-59 ml/min   Chest pain   Acute on chronic diastolic CHF; class II-III - improving -He has been diuresing well on Lasix 40 mg twice daily IV. About -8.6 L since admission.  -Echocardiogram done on 07/20/2016 showed ejection fraction of 50-55 percent with grade 1 diastolic dysfunction with mild reduction of RV function.  -Cardiology has been consulted and plans on doing Myoview 1 mL euvolemic -I will decrease his IV Lasix to daily as his renal function is going up now. His lungs sound better but still has lower extremity edema which is still improving.  Angina with coronary artery disease status post CABG X4 -Nitropaste has been switched to Imdur 60 mg in the morning and 30 mg at night -Continue aspirin, Plavix, metoprolol. History of intolerance to statin -Plans for Lexiscan Myoview -Pain control  Hypertension -Continue current medications. Also on Cardizem 240 mg daily  CK D stage III -Might be getting intravascularly dry given his creatinine is rising again after improving to  1.5 -Avoid nephrotoxic drugs. I have decreased the dose of Lasix to once a day now  Diabetes type 2 -Continue Lantus 100 units daily and insulin sliding scale  BPH -Continue Flomax  History of anxiety/insomnia -On Xanax 1 mg daily at bedtime  Will order PT  DVT prophylaxis: lovenox  Code Status: DNR Family Communication:  Patient comprehends well  Disposition Plan: Cont Inpatient tele   Consultants:   Cards  Procedures:   None  Antimicrobials:   None   Subjective: Patient states his breathing feels better but he has not tried to walk around much therefore doesn't know if he will get short of breath with walking. No other complaints at this time. States his lower summary swelling has been reducing.   Objective: Vitals:   07/21/16 1400 07/21/16 1957 07/21/16 2221 07/22/16 0454  BP: (!) 127/54  (!) 142/67 (!) 146/59  Pulse: 79  80 68  Resp: 16  18 18   Temp: 97.7 F (36.5 C)  99.4 F (37.4 C) 98.7 F (37.1 C)  TempSrc: Oral  Oral Oral  SpO2: 93% 92% 94% 95%  Weight:      Height:        Intake/Output Summary (Last 24 hours) at 07/22/16 1047 Last data filed at 07/22/16 0925  Gross per 24 hour  Intake              480 ml  Output             2625 ml  Net            -2145 ml   [employer]  07/19/16 2354 07/20/16 0500 07/21/16 0458  Weight: 130.5 kg (287 lb 12.8 oz) 128.6 kg (283 lb 9.6 oz) 127.1 kg (280 lb 1.6 oz)    Examination:  General exam: Appears calm and comfortable  RespiratoryMild bibasilar crackles CV system: S1 & S2 heard, RRR. No JVD, murmurs, rubs, gallops or clicks. 1+ bilateral lower summary pitting edema  Gastrointestinal system: Abdomen is nondistended, soft and nontender. No organomegaly or masses felt. Normal bowel sounds heard. Central nervous system: Alert and oriented. No focal neurological deficits. Extremities: Symmetric 5 x 5 power. Skin: multiple chronic skin tags throughout his body especially and torso.  Psychiatry:  Judgement and insight appear normal. Mood & affect appropriate.     Data Reviewed:   CBC:  Recent Labs Lab 07/19/16 2147 07/20/16 0113  WBC 6.7 6.4  HGB 12.9* 13.1  HCT 40.1 40.2  MCV 92.6 92.4  PLT 170 858   Basic Metabolic Panel:  Recent Labs Lab 07/19/16 2147 07/20/16 0113 07/22/16 0458  NA 137  --  133*  K 4.8  --  4.3  CL 105  --  95*  CO2 24  --  30  GLUCOSE 235*  --  114*  BUN 30*  --  42*  CREATININE 1.59* 1.50* 2.06*  CALCIUM 9.0  --  8.2*   GFR: Estimated Creatinine Clearance: 34.4 mL/min (A) (by C-G formula based on SCr of 2.06 mg/dL (H)). Liver Function Tests:  Recent Labs Lab 07/19/16 2147  AST 7*  ALT 15*  ALKPHOS 47  BILITOT 0.2*  PROT 6.9  ALBUMIN 3.7   No results for input(s): LIPASE, AMYLASE in the last 168 hours. No results for input(s): AMMONIA in the last 168 hours. Coagulation Profile: No results for input(s): INR, PROTIME in the last 168 hours. Cardiac Enzymes:  Recent Labs Lab 07/20/16 0113 07/20/16 0712 07/20/16 1353 07/20/16 1837  TROPONINI 0.13* 0.12* 0.08* 0.07*   BNP (last 3 results) No results for input(s): PROBNP in the last 8760 hours. HbA1C:  Recent Labs  07/20/16 0113  HGBA1C 7.6*   CBG:  Recent Labs Lab 07/21/16 0742 07/21/16 1128 07/21/16 1649 07/21/16 2033 07/22/16 0720  GLUCAP 75 195* 167* 215* 97   Lipid Profile: No results for input(s): CHOL, HDL, LDLCALC, TRIG, CHOLHDL, LDLDIRECT in the last 72 hours. Thyroid Function Tests: No results for input(s): TSH, T4TOTAL, FREET4, T3FREE, THYROIDAB in the last 72 hours. Anemia Panel: No results for input(s): VITAMINB12, FOLATE, FERRITIN, TIBC, IRON, RETICCTPCT in the last 72 hours. Sepsis Labs: No results for input(s): PROCALCITON, LATICACIDVEN in the last 168 hours.  No results found for this or any previous visit (from the past 240 hour(s)).       Radiology Studies: No results found.      Scheduled Meds: . allopurinol  100 mg Oral  Daily  . alprazolam  1 mg Oral QHS  . aspirin EC  81 mg Oral q morning - 10a  . clopidogrel  75 mg Oral Daily  . cyclobenzaprine  10 mg Oral QHS  . diltiazem  240 mg Oral Daily  . enoxaparin (LOVENOX) injection  40 mg Subcutaneous Q24H  . furosemide  40 mg Intravenous Q12H  . insulin aspart  0-9 Units Subcutaneous TID WC  . insulin glargine  100 Units Subcutaneous Daily  . isosorbide mononitrate  30 mg Oral QPM  . isosorbide mononitrate  60 mg Oral q morning - 10a  . mouth rinse  15 mL Mouth Rinse BID  . metoprolol tartrate  25 mg Oral BID  . tamsulosin  0.4 mg Oral QPM  . Vitamin D (Ergocalciferol)  50,000 Units Oral Q Wed   Continuous Infusions:   LOS: 2 days    Time spent: 35 mins     [name] Arsenio Loader, MD [facility] Pager [phone]   If 7PM-7AM, please contact night-coverage [email or web address] Password [facility] 07/22/2016, 10:47 AM

## 2016-07-22 NOTE — Consult Note (Signed)
   [facility] CM Inpatient Consult   07/22/2016  [name] 1933/12/27 [id number]   Referral received to assess for care management services. Spoke with the patient through his son Aaron Edelman regarding the benefits of [facility] services. Explained that [facility] is a covered benefit of patient's [employer] insurance. Reviewed information for [facility] and a brochure was provided with contact information.  Explained that [facility] does not interfere with or replace any services arranged by the inpatient care management staff. Aaron Edelman stated the patient has a hard time getting to the phone and is not interested, he asked the patient directly and [name] said," no".  Patient declined services with [facility] at this time. Inpatient case manager made aware of patients decision. For questions or concerns please contact:  Devario Bucklew RN, [facility] Liaison  [phone]) [employer] [phone]) Toll free office

## 2016-07-22 NOTE — Progress Notes (Addendum)
Progress Note  Patient Name: [name] Date of Encounter: 07/22/2016  Primary Cardiologist: Dr. [name]  Subjective   Complains of gout  Inpatient Medications    Scheduled Meds: . allopurinol  100 mg Oral Daily  . alprazolam  1 mg Oral QHS  . aspirin EC  81 mg Oral q morning - 10a  . clopidogrel  75 mg Oral Daily  . cyclobenzaprine  10 mg Oral QHS  . diltiazem  240 mg Oral Daily  . enoxaparin (LOVENOX) injection  40 mg Subcutaneous Q24H  . furosemide  40 mg Intravenous Q12H  . insulin aspart  0-9 Units Subcutaneous TID WC  . insulin glargine  100 Units Subcutaneous Daily  . isosorbide mononitrate  30 mg Oral QPM  . isosorbide mononitrate  60 mg Oral q morning - 10a  . mouth rinse  15 mL Mouth Rinse BID  . metoprolol tartrate  25 mg Oral BID  . tamsulosin  0.4 mg Oral QPM  . Vitamin D (Ergocalciferol)  50,000 Units Oral Q Wed   Continuous Infusions:  PRN Meds: acetaminophen, hydrALAZINE, morphine injection, ondansetron (ZOFRAN) IV, polyethylene glycol   Vital Signs    Vitals:   07/21/16 1400 07/21/16 1957 07/21/16 2221 07/22/16 0454  BP: (!) 127/54  (!) 142/67 (!) 146/59  Pulse: 79  80 68  Resp: 16  18 18   Temp: 97.7 F (36.5 C)  99.4 F (37.4 C) 98.7 F (37.1 C)  TempSrc: Oral  Oral Oral  SpO2: 93% 92% 94% 95%  Weight:      Height:        Intake/Output Summary (Last 24 hours) at 07/22/16 0836 Last data filed at 07/22/16 0658  Gross per 24 hour  Intake              720 ml  Output             2400 ml  Net            -1680 ml   Filed Weights   07/19/16 2354 07/20/16 0500 07/21/16 0458  Weight: 287 lb 12.8 oz (130.5 kg) 283 lb 9.6 oz (128.6 kg) 280 lb 1.6 oz (127.1 kg)    Telemetry    NSR with PAC's PVC's - Personally Reviewed  ECG       Physical Exam   GEN: No acute distress.   Neck: No JVD Cardiac: RRR, S4, 2/6 sys murmur LSB, rubs Respiratory: Decreased breath sounds with crackles at bases GI: Soft, nontender, non-distended    MS: 2-3 plus edema No deformity. Neuro:  Nonfocal  Psych: Normal affect   Labs    Chemistry Recent Labs Lab 07/19/16 2147 07/20/16 0113 07/22/16 0458  NA 137  --  133*  K 4.8  --  4.3  CL 105  --  95*  CO2 24  --  30  GLUCOSE 235*  --  114*  BUN 30*  --  42*  CREATININE 1.59* 1.50* 2.06*  CALCIUM 9.0  --  8.2*  PROT 6.9  --   --   ALBUMIN 3.7  --   --   AST 7*  --   --   ALT 15*  --   --   ALKPHOS 47  --   --   BILITOT 0.2*  --   --   GFRNONAA 39* 42* 29*  GFRAA 45* 49* 33*  ANIONGAP 8  --  8     Hematology Recent Labs Lab 07/19/16 2147 07/20/16 0113  WBC 6.7 6.4  RBC 4.33 4.35  HGB 12.9* 13.1  HCT 40.1 40.2  MCV 92.6 92.4  MCH 29.8 30.1  MCHC 32.2 32.6  RDW 15.2 15.1  PLT 170 157    Cardiac Enzymes Recent Labs Lab 07/20/16 0113 07/20/16 0712 07/20/16 1353 07/20/16 1837  TROPONINI 0.13* 0.12* 0.08* 0.07*    Recent Labs Lab 07/19/16 2155  TROPIPOC 0.05     BNP Recent Labs Lab 07/19/16 2147  BNP 227.0*     DDimer No results for input(s): DDIMER in the last 168 hours.   Radiology    No results found.  Cardiac Studies    2D echo 07/20/16: - Procedure narrative: Transthoracic echocardiography. Image   quality was fair. Contrast enhancement utilized. - Left ventricle: The cavity size was normal. There was moderate   concentric hypertrophy. Systolic function was normal. The   estimated ejection fraction was in the range of 50% to 55%.   Doppler parameters are consistent with abnormal left ventricular   relaxation (grade 1 diastolic dysfunction). Doppler parameters   are consistent with high ventricular filling pressure. - Regional wall motion abnormality: Hypokinesis of the mid   anterior, mid anteroseptal, and mid inferoseptal myocardium. - Aortic valve: Trileaflet; mildly calcified leaflets. - Mitral valve: Calcified annulus. Mildly calcified leaflets . - Left atrium: The atrium was mildly dilated. - Right ventricle: Systolic  function was mildly reduced.     Patient Profile      83 y.o. male with CAD (CABGx4 in 1994, prior stent to [place], DES to native Cx 2006, DES to SVG-RCA 04/2011, STEMI s/p DES to SVG to PDA 2014, NSTEMI 05/2014 with nonischemic nuc treated medically), chronic angina, HTN, anxiety, depression, GERD, HLD (with history of statin intolerance), morbid obesity, OSA, DM, chronic diastolic CHF, suspected CKD III, admitted with worsened chest pain and weight gain. + recent med noncompliance and possible dietary noncompliance in setting of his wife (caregiver) recently having her own health issues.  2D Echo 07/20/16: EF 50-55%, grade 1 DD, +WMA, mildly reduced RV function.   Assessment & Plan     1. Acute on chronic angina with known complex CAD - angina may be exacerbated by fluid accumulation. Continue aspirin, Plavix. he will need an ischemic evaluation once he is euvolemic (Lexiscan Myoview). Current troponin elevation consistent with demand ischemia.     2.  Acute on chronic diastolic CHF - admit weight 287 (prior office weight 271lb). Down to 32 yesterday-not weighed today yet..Diuresed 9 L so far. Would continue current IV Lasix. Agree with recommendation for [facility] at discharge given his complicated social situation recently. LV function grossly preserved - most recent echo for comparison in 01/2015 was technically difficult so there may be mild decrease in EF compared to prior.   3. Essential HTN - much better today since Imdur added.   4. CKD stage III - Dr. [name] references recent OP Cr of 1.9. Previously in the 2 range in 2016.     Signed, Ermalinda Barrios, PA-C  07/22/2016, 8:36 AM    The patient was seen and examined, and I agree with the history, physical exam, assessment and plan as documented above, with modifications as noted below. I have also personally reviewed all relevant documentation and labs.  He feels better today with less shortness of breath. Has had over 7L output in last 48  hrs with IV Lasix. BUN/Cr rising. BP now better controlled after addition of Imdur.  Plan for Lexiscan Myoview once euvolemic (may be in  outpatient setting). Continue ASA, Plavix, metoprolol. Statin intolerant.  Kate Sable, MD, [facility]  07/22/2016 11:22 AM

## 2016-07-22 NOTE — Progress Notes (Signed)
Late Entry for this shift:  Notified Dr. Reesa Chew that the patients foley was removed prior to my shift and that the patient had been complaining that he was not able to urinate.  I voiced to him that the I bladder scanned him and he was retaining about 700 cc.  Voiced to him that the patient was in so much pain that I replaced the foley and the patient immediately voided 1000 cc of amber color urine.  MD stated to leave the foley in place and that we would do another trial out tomorrow.  I also notified the MD that the patient had a rash under the abdominal folds and in the inguinal  Areas.  Suggested the use of Nystatin powder new orders given and followed.

## 2016-07-23 LAB — BASIC METABOLIC PANEL
ANION GAP: 8 (ref 5–15)
BUN: 47 mg/dL — ABNORMAL HIGH (ref 6–20)
CALCIUM: 8.7 mg/dL — AB (ref 8.9–10.3)
CHLORIDE: 98 mmol/L — AB (ref 101–111)
CO2: 29 mmol/L (ref 22–32)
Creatinine, Ser: 1.8 mg/dL — ABNORMAL HIGH (ref 0.61–1.24)
GFR calc non Af Amer: 34 mL/min — ABNORMAL LOW (ref >60)
GFR, EST AFRICAN AMERICAN: 39 mL/min — AB (ref >60)
Glucose, Bld: 106 mg/dL — ABNORMAL HIGH (ref 65–99)
POTASSIUM: 4.2 mmol/L (ref 3.5–5.1)
Sodium: 135 mmol/L (ref 135–145)

## 2016-07-23 LAB — GLUCOSE, CAPILLARY
GLUCOSE-CAPILLARY: 137 mg/dL — AB (ref 65–99)
GLUCOSE-CAPILLARY: 145 mg/dL — AB (ref 65–99)
Glucose-Capillary: 215 mg/dL — ABNORMAL HIGH (ref 65–99)

## 2016-07-23 MED ORDER — METOPROLOL TARTRATE 50 MG PO TABS
50.0000 mg | ORAL_TABLET | Freq: Two times a day (BID) | ORAL | Status: DC
  Administered 2016-07-23 – 2016-07-27 (×9): 50 mg via ORAL
  Filled 2016-07-23 (×9): qty 1

## 2016-07-23 NOTE — Progress Notes (Signed)
PROGRESS NOTE  [name] UUV:[id number] DOB: 09/26/1933 DOA: 07/19/2016 PCP: [name], MD  Brief Narrative: 83 year old man PMH CAD, CABG, PCI with stent placement, diabetes, chronic kidney disease stage III, chronic diastolic congestive heart failure presented with chest pain. Stopped taking Lasix 3 weeks prior to presentation. Admitted for chest pain, acute on chronic diastolic congestive heart failure.  Assessment/Plan 1. Acute on chronic angina with demand ischemia. Outpatient nuclear stress test planned per cardiology. Currently appears stable.  Continue Imdur.  2. Acute on chronic diastolic congestive heart failure. Still has significant volume overload on examination.  Continue IV Lasix.  3. Diabetes mellitus type 2, stable. 4. CKD stage III 5. Coronary artery disease, stable 6. COPD, stable 7. Morbid obesity 8. Urinary retention.  Continue Foley catheter for now given aggressive diuresis.  DVT prophylaxis: enoxaparin Code Status: DNR Family Communication: none Disposition Plan: HH PT    [name], MD  [facility] Direct contact: [phone] --Via amion app OR  --[email or web address]; password [email or web address]  7PM-7AM contact night coverage as above 07/23/2016, 6:51 PM  LOS: 3 days   Consultants:  Cardiology   Procedures:  2-d echo Study Conclusions  - Procedure narrative: Transthoracic echocardiography. Image   quality was fair. Contrast enhancement utilized. - Left ventricle: The cavity size was normal. There was moderate   concentric hypertrophy. Systolic function was normal. The   estimated ejection fraction was in the range of 50% to 55%.   Doppler parameters are consistent with abnormal left ventricular   relaxation (grade 1 diastolic dysfunction). Doppler parameters   are consistent with high ventricular filling pressure. - Regional wall motion abnormality: Hypokinesis of the mid   anterior, mid anteroseptal, and mid inferoseptal  myocardium. - Aortic valve: Trileaflet; mildly calcified leaflets. - Mitral valve: Calcified annulus. Mildly calcified leaflets . - Left atrium: The atrium was mildly dilated. - Right ventricle: Systolic function was mildly reduced.  Antimicrobials:    Interval history/Subjective: Overall feeling okay but still has significant lower extremity edema. Breathing better.  Objective: Vitals:   07/23/16 0513 07/23/16 1124 07/23/16 1603 07/23/16 1605  BP: (!) 179/71 (!) 158/69 (!) 153/93 (!) 154/58  Pulse: 86 79 77   Resp: 20  16   Temp: 98.6 F (37 C)  99 F (37.2 C)   TempSrc: Oral  Oral   SpO2: 100%  95%   Weight:      Height:        Intake/Output Summary (Last 24 hours) at 07/23/16 1851 Last data filed at 07/23/16 1821  Gross per 24 hour  Intake              720 ml  Output             3800 ml  Net            -3080 ml     Filed Weights   07/20/16 0500 07/21/16 0458 07/23/16 0455  Weight: 128.6 kg (283 lb 9.6 oz) 127.1 kg (280 lb 1.6 oz) 127.7 kg (281 lb 9.6 oz)    Exam:Blood sugars stable.    Constitutional: Appears calm, comfortable Respiratory: Clear to auscultation bilaterally. No wheezes, rales or rhonchi. Cardiovascular: Regular rate and rhythm. No murmur, rub or gallop. 2+ bilateral lower extremity edema. Psychiatric: Grossly normal mood and affect. Speech fluent and appropriate.  I have personally reviewed following labs and imaging studies:  Blood sugars stable.  BUN 47, no significant change. Creatinine modestly improved, 1.80.  Scheduled Meds: . allopurinol  100 mg  Oral Daily  . alprazolam  1 mg Oral QHS  . aspirin EC  81 mg Oral q morning - 10a  . clopidogrel  75 mg Oral Daily  . cyclobenzaprine  10 mg Oral QHS  . diltiazem  240 mg Oral Daily  . enoxaparin (LOVENOX) injection  40 mg Subcutaneous Q24H  . furosemide  40 mg Intravenous Daily  . insulin aspart  0-9 Units Subcutaneous TID WC  . insulin glargine  100 Units Subcutaneous Daily  .  isosorbide mononitrate  30 mg Oral QPM  . isosorbide mononitrate  60 mg Oral q morning - 10a  . mouth rinse  15 mL Mouth Rinse BID  . metoprolol tartrate  50 mg Oral BID  . nystatin   Topical Q8H  . tamsulosin  0.4 mg Oral QPM  . Vitamin D (Ergocalciferol)  50,000 Units Oral Q Wed   Continuous Infusions:  Principal Problem:   Acute on chronic diastolic heart failure (HCC) Active Problems:   Coronary atherosclerosis of native coronary artery   CAD (coronary artery disease) of artery bypass graft   CKD (chronic kidney disease) stage 3, GFR 30-59 ml/min   LOS: 3 days

## 2016-07-23 NOTE — Progress Notes (Signed)
Progress Note  Patient Name: [name] Date of Encounter: 07/23/2016  Primary Cardiologist: [name], MD  Subjective  Feeling better. Breathing better than when first admitted.  Not motivated to ambulate because of gout.   Inpatient Medications    Scheduled Meds: . allopurinol  100 mg Oral Daily  . alprazolam  1 mg Oral QHS  . aspirin EC  81 mg Oral q morning - 10a  . clopidogrel  75 mg Oral Daily  . cyclobenzaprine  10 mg Oral QHS  . diltiazem  240 mg Oral Daily  . enoxaparin (LOVENOX) injection  40 mg Subcutaneous Q24H  . furosemide  40 mg Intravenous Daily  . insulin aspart  0-9 Units Subcutaneous TID WC  . insulin glargine  100 Units Subcutaneous Daily  . isosorbide mononitrate  30 mg Oral QPM  . isosorbide mononitrate  60 mg Oral q morning - 10a  . mouth rinse  15 mL Mouth Rinse BID  . metoprolol tartrate  25 mg Oral BID  . nystatin   Topical Q8H  . tamsulosin  0.4 mg Oral QPM  . Vitamin D (Ergocalciferol)  50,000 Units Oral Q Wed   Continuous Infusions:  PRN Meds: acetaminophen, hydrALAZINE, morphine injection, ondansetron (ZOFRAN) IV, polyethylene glycol   Vital Signs    Vitals:   07/22/16 1426 07/22/16 2201 07/23/16 0455 07/23/16 0513  BP:  (!) 158/60  (!) 179/71  Pulse:  80  86  Resp:  15  20  Temp:  99.1 F (37.3 C)  98.6 F (37 C)  TempSrc:  Oral  Oral  SpO2: 91% 96%  100%  Weight:   281 lb 9.6 oz (127.7 kg)   Height:        Intake/Output Summary (Last 24 hours) at 07/23/16 0823 Last data filed at 07/23/16 0454  Gross per 24 hour  Intake                0 ml  Output             3425 ml  Net            -3425 ml   Filed Weights   07/20/16 0500 07/21/16 0458 07/23/16 0455  Weight: 283 lb 9.6 oz (128.6 kg) 280 lb 1.6 oz (127.1 kg) 281 lb 9.6 oz (127.7 kg)    Telemetry    SR with frequent PAC's. rates in the 90's to low 100's at rest. - Personally Reviewed  Physical Exam   GEN: No acute distress.   Neck: No JVD Cardiac: RRR, no  murmurs, rubs, or gallops.  Respiratory: Clear to auscultation bilaterally. GI: Soft, nontender, non-distended  MS: No edema; No deformity. Neuro:  Nonfocal  Psych: Normal affect   Labs    Chemistry Recent Labs Lab 07/19/16 2147 07/20/16 0113 07/22/16 0458 07/23/16 0508  NA 137  --  133* 135  K 4.8  --  4.3 4.2  CL 105  --  95* 98*  CO2 24  --  30 29  GLUCOSE 235*  --  114* 106*  BUN 30*  --  42* 47*  CREATININE 1.59* 1.50* 2.06* 1.80*  CALCIUM 9.0  --  8.2* 8.7*  PROT 6.9  --   --   --   ALBUMIN 3.7  --   --   --   AST 7*  --   --   --   ALT 15*  --   --   --   ALKPHOS 47  --   --   --  BILITOT 0.2*  --   --   --   GFRNONAA 39* 42* 29* 34*  GFRAA 45* 49* 33* 39*  ANIONGAP 8  --  8 8     Hematology Recent Labs Lab 07/19/16 2147 07/20/16 0113  WBC 6.7 6.4  RBC 4.33 4.35  HGB 12.9* 13.1  HCT 40.1 40.2  MCV 92.6 92.4  MCH 29.8 30.1  MCHC 32.2 32.6  RDW 15.2 15.1  PLT 170 157    Cardiac Enzymes Recent Labs Lab 07/20/16 0113 07/20/16 0712 07/20/16 1353 07/20/16 1837  TROPONINI 0.13* 0.12* 0.08* 0.07*    Recent Labs Lab 07/19/16 2155  TROPIPOC 0.05     BNP Recent Labs Lab 07/19/16 2147  BNP 227.0*        Cardiac Studies   2D echo 07/20/16: - Procedure narrative: Transthoracic echocardiography. Image quality was fair. Contrast enhancement utilized. - Left ventricle: The cavity size was normal. There was moderate concentric hypertrophy. Systolic function was normal. The estimated ejection fraction was in the range of 50% to 55%. Doppler parameters are consistent with abnormal left ventricular relaxation (grade 1 diastolic dysfunction). Doppler parameters are consistent with high ventricular filling pressure. - Regional wall motion abnormality: Hypokinesis of the mid anterior, mid anteroseptal, and mid inferoseptal myocardium. - Aortic valve: Trileaflet; mildly calcified leaflets. - Mitral valve: Calcified annulus. Mildly  calcified leaflets . - Left atrium: The atrium was mildly dilated. - Right ventricle: Systolic function was mildly reduced.   Patient Profile     83 y.o. male with CAD (CABGx4 in 1994, prior stent to [place], DES to native Cx 2006, DES to SVG-RCA 04/2011, STEMI s/p DES to SVG to PDA 2014, NSTEMI 05/2014 with nonischemic nuc treated medically), chronic angina, HTN, anxiety, depression, GERD, HLD (with history of statin intolerance), morbid obesity, OSA, DM, chronic diastolic CHF, suspected CKD III, admitted with worsened chest pain and weight gain. + recent med noncompliance and possible dietary noncompliance in setting of his wife (caregiver) recently having her own health issues. 2D Echo 07/20/16: EF 50-55%, grade 1 DD, +WMA, mildly reduced RV function.  Assessment & Plan    1. Atrial fib vs NSR with frequent PACs.: Noted on telemetry. Will order EKG for confirmation. Rate is not well controlled currently at rest. Review of telemetry reveals HR's in the 90's and low 100's. Will increase metoprolol to  50 mg p.o BID. BP is stable.   2. Acute on Chronic Diastolic CHF: He remains on Lasix IV, has diuresed 12.7 liters. Wt is down 7 lbs, but uncertain of accuracy. He had to have foley catheter placed back in as he was unable to urinate after removal. Now on IV lasix 40 mg daily, from BID. Creatinine 1.80. CO2 29. Continues to have evidence of fluid overload. Continue IV for another 24 hours. May need to transition to torsemide for OP therapy, for better bioavailability. He is on lasix 40 mg daily and metolazone 2.5 daily at home.   3. Hypertension: BP is elevated this am. Will increase metoprolol as above. Continue diltiazem, nitrates, and  Hydralazine prn.   4. CAD:  Complex without complaints of angina this am. Continues on DAPT with ASA and Plavix. Plan OP Lexiscan.     Signed, Jory Sims, NP  07/23/2016, 8:23 AM    The patient was seen and examined, and I agree with the history, physical  exam, assessment and plan as documented above, with modifications as noted below. I have also personally reviewed all relevant documentation and labs.  He continues to feel better with regards to shortness of breath. Still has leg edema. Over 3.4 L output in last 24 hours. Currently on 40 mg IV Lasix daily. BUN up to 47 from 42, creatinine down to 1.8 from 2.06. BP markedly elevated.  Agree with increasing metoprolol to 50 mg bid. May need to schedule oral hydralazine if BP does not improve.  Plan for outpatient nuclear stress test. Continue ASA, Plavix, and nitrates.  Kate Sable, MD, [facility]  07/23/2016 10:54 AM

## 2016-07-23 NOTE — Progress Notes (Signed)
**Note De-Identified [name] Obfuscation** EKG complete; results reported to RN and placed in patient chart

## 2016-07-24 DIAGNOSIS — E119 Type 2 diabetes mellitus without complications: Secondary | ICD-10-CM

## 2016-07-24 DIAGNOSIS — I248 Other forms of acute ischemic heart disease: Secondary | ICD-10-CM

## 2016-07-24 LAB — BASIC METABOLIC PANEL
Anion gap: 10 (ref 5–15)
BUN: 53 mg/dL — ABNORMAL HIGH (ref 6–20)
CHLORIDE: 99 mmol/L — AB (ref 101–111)
CO2: 27 mmol/L (ref 22–32)
Calcium: 8.5 mg/dL — ABNORMAL LOW (ref 8.9–10.3)
Creatinine, Ser: 1.82 mg/dL — ABNORMAL HIGH (ref 0.61–1.24)
GFR calc non Af Amer: 33 mL/min — ABNORMAL LOW (ref >60)
GFR, EST AFRICAN AMERICAN: 38 mL/min — AB (ref >60)
Glucose, Bld: 76 mg/dL (ref 65–99)
POTASSIUM: 4.2 mmol/L (ref 3.5–5.1)
SODIUM: 136 mmol/L (ref 135–145)

## 2016-07-24 LAB — GLUCOSE, CAPILLARY
GLUCOSE-CAPILLARY: 91 mg/dL (ref 65–99)
GLUCOSE-CAPILLARY: 141 mg/dL — AB (ref 65–99)
GLUCOSE-CAPILLARY: 143 mg/dL — AB (ref 65–99)
Glucose-Capillary: 252 mg/dL — ABNORMAL HIGH (ref 65–99)

## 2016-07-24 MED ORDER — PREDNISONE 20 MG PO TABS
40.0000 mg | ORAL_TABLET | Freq: Every day | ORAL | Status: AC
  Administered 2016-07-24 – 2016-07-26 (×3): 40 mg via ORAL
  Filled 2016-07-24 (×3): qty 2

## 2016-07-24 NOTE — Progress Notes (Signed)
PROGRESS NOTE  [name]:[id number] DOB: Aug 23, 1933 DOA: 07/19/2016 PCP: [name], MD  Brief Narrative: 83 year old man PMH CAD, CABG, PCI with stent placement, diabetes, chronic kidney disease stage III, chronic diastolic congestive heart failure presented with chest pain. Stopped taking Lasix 3 weeks prior to presentation. Admitted for chest pain, acute on chronic diastolic congestive heart failure.  Assessment/Plan 1. Acute on chronic angina with demand ischemia. Outpatient nuclear stress test planned per cardiology. Stable.  Continue Imdur.  2. Acute on chronic diastolic congestive heart failure. Massive volume overload persists.  Continue IV Lasix.  3. Diabetes mellitus type 2, remains stable. 4. CKD stage III, remains stable. 5. Coronary artery disease, remains stable. 6. COPD, stable 7. Acute on chronic gout. continue allopurinol. Trial steroid. 8. Morbid obesity 9. Urinary retention.  Continue Foley catheter for now given aggressive diuresis.  DVT prophylaxis: enoxaparin Code Status: DNR Family Communication: none Disposition Plan: HH PT    [name], MD  [facility] Direct contact: [phone] --Via amion app OR  --[email or web address]; password [email or web address]  7PM-7AM contact night coverage as above 07/24/2016, 4:32 PM  LOS: 4 days   Consultants:  Cardiology   Procedures:  2-d echo Study Conclusions  - Procedure narrative: Transthoracic echocardiography. Image   quality was fair. Contrast enhancement utilized. - Left ventricle: The cavity size was normal. There was moderate   concentric hypertrophy. Systolic function was normal. The   estimated ejection fraction was in the range of 50% to 55%.   Doppler parameters are consistent with abnormal left ventricular   relaxation (grade 1 diastolic dysfunction). Doppler parameters   are consistent with high ventricular filling pressure. - Regional wall motion abnormality: Hypokinesis of the mid   anterior, mid anteroseptal, and mid inferoseptal myocardium. - Aortic valve: Trileaflet; mildly calcified leaflets. - Mitral valve: Calcified annulus. Mildly calcified leaflets . - Left atrium: The atrium was mildly dilated. - Right ventricle: Systolic function was mildly reduced.  Antimicrobials:    Interval history/Subjective: Continues to feel better. Breathing better. Still has significant bilateral lower extremity edema.  Objective: Vitals:   07/23/16 1605 07/23/16 2142 07/24/16 0710 07/24/16 1323  BP: (!) 154/58 100/83 (!) 142/53 (!) 146/68  Pulse:  74 71 79  Resp:  16 16 18   Temp:  99.3 F (37.4 C) 98.4 F (36.9 C) 97.8 F (36.6 C)  TempSrc:  Oral Oral Oral  SpO2:  96% 97% 95%  Weight:   126.6 kg (279 lb)   Height:        Intake/Output Summary (Last 24 hours) at 07/24/16 1632 Last data filed at 07/24/16 1323  Gross per 24 hour  Intake              720 ml  Output             3000 ml  Net            -2280 ml     Filed Weights   07/21/16 0458 07/23/16 0455 07/24/16 0710  Weight: 127.1 kg (280 lb 1.6 oz) 127.7 kg (281 lb 9.6 oz) 126.6 kg (279 lb)    Exam:Blood sugars stable.    Constitutional: Appears calm, comfortable. Respiratory: Clear to auscultation bilaterally. No wheezes, rales or rhonchi. Normal respiratory effort. Cardiovascular: Regular rate and rhythm. No murmur, rub or gallop. 4+ bilateral lower extremity edema. Psychiatric: Grossly normal mood and affect. Speech fluent and appropriate.  I have personally reviewed following labs and imaging studies:  Urine output 1600. -13.6 L since admission.  Blood sugars stable.  BUN slightly higher, 53. Creatinine stable 1.82.  Scheduled Meds: . allopurinol  100 mg Oral Daily  . alprazolam  1 mg Oral QHS  . aspirin EC  81 mg Oral q morning - 10a  . clopidogrel  75 mg Oral Daily  . cyclobenzaprine  10 mg Oral QHS  . diltiazem  240 mg Oral Daily  . enoxaparin (LOVENOX) injection  40 mg Subcutaneous  Q24H  . furosemide  40 mg Intravenous Daily  . insulin aspart  0-9 Units Subcutaneous TID WC  . insulin glargine  100 Units Subcutaneous Daily  . isosorbide mononitrate  30 mg Oral QPM  . isosorbide mononitrate  60 mg Oral q morning - 10a  . mouth rinse  15 mL Mouth Rinse BID  . metoprolol tartrate  50 mg Oral BID  . nystatin   Topical Q8H  . tamsulosin  0.4 mg Oral QPM  . Vitamin D (Ergocalciferol)  50,000 Units Oral Q Wed   Continuous Infusions:  Principal Problem:   Acute on chronic diastolic heart failure (HCC) Active Problems:   Coronary atherosclerosis of native coronary artery   CAD (coronary artery disease) of artery bypass graft   CKD (chronic kidney disease) stage 3, GFR 30-59 ml/min   LOS: 4 days

## 2016-07-24 NOTE — Care Management Note (Signed)
Case Management Note  Patient Details  Name: [name] MRN: [id number] Date of Birth: 04-May-1933  Expected Discharge Date:  07/21/16               Expected Discharge Plan:  Home/Self Care  In-House Referral:  NA  Discharge planning Services  CM Consult  Post Acute Care Choice:  NA Choice offered to:  NA  Status of Service:  In process, will continue to follow   Additional Comments: Pt continues to plan for return home with self care. He is refusing HH and [facility] services. Pt has supplemental oxygen for HS at home. If continuous is needed he will need home oxygen assessment.   Sherald Barge, RN 07/24/2016, 1:42 PM

## 2016-07-24 NOTE — Care Management Important Message (Signed)
Important Message  Patient Details  Name: [name] MRN: [id number] Date of Birth: 07/02/1933   Medicare Important Message Given:  Yes    Sherald Barge, RN 07/24/2016, 1:43 PM

## 2016-07-24 NOTE — Progress Notes (Signed)
Physical Therapy Treatment Patient Details Name: [name] MRN: [id number] DOB: [birth year] Today's Date: 07/24/2016    History of Present Illness 83 y.o. male, With history of CAD status post CABG 4 in 1994, with subsequent PCI and DES placement, diabetes mellitus, chronic kidney disease stage III, chronic diastolic CHF who came to the hospital with chief complaint of chest pain which started on TPN. Patient says that the pain was getting worse on exertion and he had to take sublingual nitroglycerin. His chest pain subsided after taking nitroglycerin, but again started when he walked to his backyard.    EKG showed normal sinus rhythm, left posterior fascicular block, (no change from previous EKGs,)   Patient says that he has not been taking Lasix for past 3 weeks as his wife , who used to  give him medications is currently hospitalized. His previous weight was 271 pounds and today he weighs 287 pounds. He also complains of swelling in lower extremities.  Dx: Chest pain and unstable angina relieved with nitropaste, and acute on chronic diastolic congestive heart faillure.     PT Comments    Pt received in bed, and was agreeable to PT tx.  Pt demonstrated significant improvement in gait distance today, however continues to be limited by R foot gout pain, and decreased endurance.  He was able to ambulate 22ft with standard bariatric walker with min guard.  Continue to recommend HHPT upon d/c.    Follow Up Recommendations  Home health PT     Equipment Recommendations       Recommendations for Other Services       Precautions / Restrictions Precautions Precautions: Fall Precaution Comments: Due to immobility, however pt states that he has not had any falls in the past 6 months.   Restrictions Weight Bearing Restrictions: No    Mobility  Bed Mobility Overal bed mobility: Needs Assistance Bed Mobility: Supine to Sit     Supine to sit: Min guard;HOB elevated         Transfers Overall transfer level: Needs assistance Equipment used: Standard walker (bariatric) Transfers: Sit to/from Stand Sit to Stand: Supervision            Ambulation/Gait Ambulation/Gait assistance: Supervision Ambulation Distance (Feet): 80 Feet Assistive device: Standard walker (bariatric) Gait Pattern/deviations: Step-to pattern;Trunk flexed         Stairs            Wheelchair Mobility    Modified Rankin (Stroke Patients Only)       Balance Overall balance assessment: Needs assistance Sitting-balance support: Bilateral upper extremity supported;Feet supported Sitting balance-Leahy Scale: Good     Standing balance support: Bilateral upper extremity supported Standing balance-Leahy Scale: Fair                              Cognition Arousal/Alertness: Awake/alert Behavior During Therapy: WFL for tasks assessed/performed Overall Cognitive Status: Within Functional Limits for tasks assessed                                        Exercises      General Comments        Pertinent Vitals/Pain Pain Location: R foot - gout pain - pt does not rate Pain Intervention(s): Limited activity within patient's tolerance;Monitored during session;Repositioned    Home Living  Prior Function            PT Goals (current goals can now be found in the care plan section) Acute Rehab PT Goals Patient Stated Goal: To go home PT Goal Formulation: With patient Time For Goal Achievement: 07/29/16 Potential to Achieve Goals: Fair Progress towards PT goals: Progressing toward goals    Frequency    Min 3X/week      PT Plan Current plan remains appropriate    Co-evaluation             End of Session Equipment Utilized During Treatment: Gait belt;Oxygen Activity Tolerance: Patient limited by fatigue;Patient limited by pain Patient left: in chair;with call bell/phone within reach Nurse  Communication: Mobility status PT Visit Diagnosis: Muscle weakness (generalized) (M62.81);Other abnormalities of gait and mobility (R26.89)     Time: 1510-1540 PT Time Calculation (min) (ACUTE ONLY): 30 min  Charges:  $Gait Training: 8-22 mins $Therapeutic Activity: 8-22 mins                    G Codes:       Beth Arrielle Mcginn, PT, DPT X: [phone]

## 2016-07-24 NOTE — Progress Notes (Addendum)
Progress Note  Patient Name: [name] Date of Encounter: 07/24/2016  Primary Cardiologist: [name]  Subjective   No complaints.  Inpatient Medications    Scheduled Meds: . allopurinol  100 mg Oral Daily  . alprazolam  1 mg Oral QHS  . aspirin EC  81 mg Oral q morning - 10a  . clopidogrel  75 mg Oral Daily  . cyclobenzaprine  10 mg Oral QHS  . diltiazem  240 mg Oral Daily  . enoxaparin (LOVENOX) injection  40 mg Subcutaneous Q24H  . furosemide  40 mg Intravenous Daily  . insulin aspart  0-9 Units Subcutaneous TID WC  . insulin glargine  100 Units Subcutaneous Daily  . isosorbide mononitrate  30 mg Oral QPM  . isosorbide mononitrate  60 mg Oral q morning - 10a  . mouth rinse  15 mL Mouth Rinse BID  . metoprolol tartrate  50 mg Oral BID  . nystatin   Topical Q8H  . tamsulosin  0.4 mg Oral QPM  . Vitamin D (Ergocalciferol)  50,000 Units Oral Q Wed   Continuous Infusions:  PRN Meds: acetaminophen, hydrALAZINE, ondansetron (ZOFRAN) IV, polyethylene glycol   Vital Signs    Vitals:   07/23/16 1603 07/23/16 1605 07/23/16 2142 07/24/16 0710  BP: (!) 153/93 (!) 154/58 100/83 (!) 142/53  Pulse: 77  74 71  Resp: 16  16 16   Temp: 99 F (37.2 C)  99.3 F (37.4 C) 98.4 F (36.9 C)  TempSrc: Oral  Oral Oral  SpO2: 95%  96% 97%  Weight:    279 lb (126.6 kg)  Height:        Intake/Output Summary (Last 24 hours) at 07/24/16 0911 Last data filed at 07/24/16 7902  Gross per 24 hour  Intake              480 ml  Output             3000 ml  Net            -2520 ml   Filed Weights   07/21/16 0458 07/23/16 0455 07/24/16 0710  Weight: 280 lb 1.6 oz (127.1 kg) 281 lb 9.6 oz (127.7 kg) 279 lb (126.6 kg)    Telemetry    N/A - Personally Reviewed  ECG    NSR/sinus tach with occ PACs - Personally Reviewed  Physical Exam   GEN: No acute distress, morbidly obese HEENT: Normocephalic, atraumatic, sclera non-icteric. Neck:No JVD or bruits. Cardiac:RRR no  murmurs, rubs, or gallops.  Radials/DP/PT 1+ and equal bilaterally.  Respiratory:Diminished bases, otherwise clear to auscultation bilaterally. Breathing is unlabored. IO:XBDZ, nontender, non-distended, BS +x 4. MS:no deformity. Dry skin Extremities: No clubbing or cyanosis. Bilateral 1-2+ LE edema. Neuro:AAOx3. Follows commands. Psych:  Responds to questions appropriately with a normal affect  Labs    Chemistry Recent Labs Lab 07/19/16 2147  07/22/16 0458 07/23/16 0508 07/24/16 0553  NA 137  --  133* 135 136  K 4.8  --  4.3 4.2 4.2  CL 105  --  95* 98* 99*  CO2 24  --  30 29 27   GLUCOSE 235*  --  114* 106* 76  BUN 30*  --  42* 47* 53*  CREATININE 1.59*  < > 2.06* 1.80* 1.82*  CALCIUM 9.0  --  8.2* 8.7* 8.5*  PROT 6.9  --   --   --   --   ALBUMIN 3.7  --   --   --   --  AST 7*  --   --   --   --   ALT 15*  --   --   --   --   ALKPHOS 47  --   --   --   --   BILITOT 0.2*  --   --   --   --   GFRNONAA 39*  < > 29* 34* 33*  GFRAA 45*  < > 33* 39* 38*  ANIONGAP 8  --  8 8 10   < > = values in this interval not displayed.   Hematology Recent Labs Lab 07/19/16 2147 07/20/16 0113  WBC 6.7 6.4  RBC 4.33 4.35  HGB 12.9* 13.1  HCT 40.1 40.2  MCV 92.6 92.4  MCH 29.8 30.1  MCHC 32.2 32.6  RDW 15.2 15.1  PLT 170 157    Cardiac Enzymes Recent Labs Lab 07/20/16 0113 07/20/16 0712 07/20/16 1353 07/20/16 1837  TROPONINI 0.13* 0.12* 0.08* 0.07*    Recent Labs Lab 07/19/16 2155  TROPIPOC 0.05     BNP Recent Labs Lab 07/19/16 2147  BNP 227.0*    Radiology    No results found.  Cardiac Studies   2D echo 07/20/16: - Procedure narrative: Transthoracic echocardiography. Image quality was fair. Contrast enhancement utilized. - Left ventricle: The cavity size was normal. There was moderate concentric hypertrophy. Systolic function was normal. The estimated ejection fraction was in the range of 50% to 55%. Doppler parameters are consistent with  abnormal left ventricular relaxation (grade 1 diastolic dysfunction). Doppler parameters are consistent with high ventricular filling pressure. - Regional wall motion abnormality: Hypokinesis of the mid anterior, mid anteroseptal, and mid inferoseptal myocardium. - Aortic valve: Trileaflet; mildly calcified leaflets. - Mitral valve: Calcified annulus. Mildly calcified leaflets . - Left atrium: The atrium was mildly dilated. - Right ventricle: Systolic function was mildly reduced.  Patient Profile     83 y.o. male with CAD (CABGx4 in 1994, prior stent to [place], DES to native Cx 2006, DES to SVG-RCA 04/2011, STEMI s/p DES to SVG to PDA 2014, NSTEMI 05/2014 with nonischemic nuc treated medically), chronic angina, HTN, anxiety, depression, GERD, HLD (with history of statin intolerance), morbid obesity, OSA, DM, chronic diastolic CHF, suspected CKD III, admitted with worsened chest pain and weight gain. + recent med noncompliance and possible dietary noncompliance in setting of his wife (caregiver) recently having her own health issues.  2D Echo 07/20/16: EF 50-55%, grade 1 DD, +WMA, mildly reduced RV function.  Assessment & Plan    1. Acute on chronic angina with known complex CAD - angina may have been exacerbated by fluid accumulation. He is currently symptomatically stable. Continue aspirin, Plavix, and nitrates. Troponin elevation consistent with demand ischemia. Plan OP stress test.  2.  Acute on chronic diastolic CHF - admit weight 287 (prior office weight 271lb). Down to 279 today. Would continue current IV Lasix. Agree with recommendation for [place] at discharge given his complicated social situation recently. LV function grossly preserved - most recent echo for comparison in 01/2015 was technically difficult so there may be mild decrease in EF compared to prior.  3. Essential HTN - improved following BB increase.  4. CKD stage III - Cr remains near baseline.  Dispo: patient  unfortunately declines [facility] management, would remain at risk for rehospitalization wo will need close OP f/u when ready for dc. PT recommending HHPT but not sure patient will comply.  Signed, Kate Sable, MD, [facility] 07/24/2016 9:51 AM

## 2016-07-25 LAB — BASIC METABOLIC PANEL
ANION GAP: 9 (ref 5–15)
BUN: 58 mg/dL — ABNORMAL HIGH (ref 6–20)
CO2: 26 mmol/L (ref 22–32)
Calcium: 8.4 mg/dL — ABNORMAL LOW (ref 8.9–10.3)
Chloride: 98 mmol/L — ABNORMAL LOW (ref 101–111)
Creatinine, Ser: 1.78 mg/dL — ABNORMAL HIGH (ref 0.61–1.24)
GFR calc Af Amer: 39 mL/min — ABNORMAL LOW (ref >60)
GFR, EST NON AFRICAN AMERICAN: 34 mL/min — AB (ref >60)
GLUCOSE: 223 mg/dL — AB (ref 65–99)
Potassium: 4.7 mmol/L (ref 3.5–5.1)
Sodium: 133 mmol/L — ABNORMAL LOW (ref 135–145)

## 2016-07-25 LAB — GLUCOSE, CAPILLARY
GLUCOSE-CAPILLARY: 221 mg/dL — AB (ref 65–99)
GLUCOSE-CAPILLARY: 317 mg/dL — AB (ref 65–99)
Glucose-Capillary: 366 mg/dL — ABNORMAL HIGH (ref 65–99)
Glucose-Capillary: 274 mg/dL — ABNORMAL HIGH (ref 65–99)

## 2016-07-25 NOTE — Progress Notes (Signed)
PROGRESS NOTE  [name] YQM:[id number] DOB: Apr 19, 1933 DOA: 07/19/2016 PCP: [name] Hilding, MD  Brief Narrative: 83 year old man PMH CAD, CABG, PCI with stent placement, diabetes, chronic kidney disease stage III, chronic diastolic congestive heart failure presented with chest pain. Stopped taking Lasix 3 weeks prior to presentation. Admitted for chest pain, acute on chronic diastolic congestive heart failure.  Assessment/Plan 1. Acute on chronic diastolic congestive heart failure.  2. Massive volume overload continues to slowly improve with excellent diuresis and stable creatinine. 3. Acute on chronic angina with demand ischemia. Outpatient nuclear stress test planned per cardiology. Appears stable.   Continue Imdur.  4. Diabetes mellitus type 2,labile today. No changes for now, but may need to add meal coverage on steroids. 5. CKD stage III, remains stable. 6. Coronary artery disease, remains stable. 7. COPD stable. 8. Acute on chronic gout. Continue allopurinol. Improving with steroids. 9. Morbid obesity 10. Urinary retention.  Continue Foley catheter for now given aggressive diuresis.  Overall improving, but still has massive volume overload. Not ready for discharge.  DVT prophylaxis: enoxaparin Code Status: DNR Family Communication: none Disposition Plan: HH PT    [name] Hodgkins, MD  [facility] Direct contact: [phone] --Via amion app OR  --[email or web address]; password [email or web address]  7PM-7AM contact night coverage as above 07/25/2016, 1:57 PM  LOS: 5 days   Consultants:  Cardiology   Procedures:  2-d echo Study Conclusions  - Procedure narrative: Transthoracic echocardiography. Image   quality was fair. Contrast enhancement utilized. - Left ventricle: The cavity size was normal. There was moderate   concentric hypertrophy. Systolic function was normal. The   estimated ejection fraction was in the range of 50% to 55%.   Doppler parameters are consistent  with abnormal left ventricular   relaxation (grade 1 diastolic dysfunction). Doppler parameters   are consistent with high ventricular filling pressure. - Regional wall motion abnormality: Hypokinesis of the mid   anterior, mid anteroseptal, and mid inferoseptal myocardium. - Aortic valve: Trileaflet; mildly calcified leaflets. - Mitral valve: Calcified annulus. Mildly calcified leaflets . - Left atrium: The atrium was mildly dilated. - Right ventricle: Systolic function was mildly reduced.  Antimicrobials:    Interval history/Subjective: Breathing fine, eating well, swelling somewhat better.  Objective: Vitals:   07/24/16 1323 07/24/16 2221 07/25/16 0734 07/25/16 1150  BP: (!) 146/68 (!) 140/59 (!) 156/62 (!) 170/66  Pulse: 79 71 80 87  Resp: 18 20 20    Temp: 97.8 F (36.6 C) 99.1 F (37.3 C) 97.8 F (36.6 C)   TempSrc: Oral Oral Oral   SpO2: 95% 94% 97%   Weight:   124 kg (273 lb 5.9 oz)   Height:        Intake/Output Summary (Last 24 hours) at 07/25/16 1357 Last data filed at 07/25/16 1055  Gross per 24 hour  Intake              240 ml  Output             2000 ml  Net            -1760 ml     Filed Weights   07/23/16 0455 07/24/16 0710 07/25/16 0734  Weight: 127.7 kg (281 lb 9.6 oz) 126.6 kg (279 lb) 124 kg (273 lb 5.9 oz)    Exam: Constitutional: appears calm, comfortable, sitting on side of bed CV: RRR no m/r/g. 3+ bilateral LE edema. Respiratory: CTA bilaterally, no w/r/r. Normal respiratory effort. Psychiatric: grossly normal mood and affect, speech fluent  and appropriate.  I have personally reviewed the following:  UOP 3700 -2980 overnight -16.625 L since admission  Labs:  CBG labile  BUN and creatinine stable, 58/1.78  Imaging studies:    Medical tests:      Scheduled Meds: . allopurinol  100 mg Oral Daily  . alprazolam  1 mg Oral QHS  . aspirin EC  81 mg Oral q morning - 10a  . clopidogrel  75 mg Oral Daily  . cyclobenzaprine  10  mg Oral QHS  . diltiazem  240 mg Oral Daily  . enoxaparin (LOVENOX) injection  40 mg Subcutaneous Q24H  . furosemide  40 mg Intravenous Daily  . insulin aspart  0-9 Units Subcutaneous TID WC  . insulin glargine  100 Units Subcutaneous Daily  . isosorbide mononitrate  30 mg Oral QPM  . isosorbide mononitrate  60 mg Oral q morning - 10a  . mouth rinse  15 mL Mouth Rinse BID  . metoprolol tartrate  50 mg Oral BID  . nystatin   Topical Q8H  . predniSONE  40 mg Oral Q breakfast  . tamsulosin  0.4 mg Oral QPM  . Vitamin D (Ergocalciferol)  50,000 Units Oral Q Wed   Continuous Infusions:  Principal Problem:   Acute on chronic diastolic heart failure (HCC) Active Problems:   Coronary atherosclerosis of native coronary artery   CAD (coronary artery disease) of artery bypass graft   CKD (chronic kidney disease) stage 3, GFR 30-59 ml/min   LOS: 5 days

## 2016-07-26 DIAGNOSIS — E1122 Type 2 diabetes mellitus with diabetic chronic kidney disease: Secondary | ICD-10-CM

## 2016-07-26 LAB — BASIC METABOLIC PANEL
Anion gap: 8 (ref 5–15)
BUN: 63 mg/dL — ABNORMAL HIGH (ref 6–20)
CHLORIDE: 98 mmol/L — AB (ref 101–111)
CO2: 28 mmol/L (ref 22–32)
CREATININE: 1.74 mg/dL — AB (ref 0.61–1.24)
Calcium: 8.4 mg/dL — ABNORMAL LOW (ref 8.9–10.3)
GFR calc Af Amer: 41 mL/min — ABNORMAL LOW (ref >60)
GFR calc non Af Amer: 35 mL/min — ABNORMAL LOW (ref >60)
Glucose, Bld: 147 mg/dL — ABNORMAL HIGH (ref 65–99)
Potassium: 4.2 mmol/L (ref 3.5–5.1)
SODIUM: 134 mmol/L — AB (ref 135–145)

## 2016-07-26 LAB — GLUCOSE, CAPILLARY
GLUCOSE-CAPILLARY: 127 mg/dL — AB (ref 65–99)
GLUCOSE-CAPILLARY: 224 mg/dL — AB (ref 65–99)
Glucose-Capillary: 172 mg/dL — ABNORMAL HIGH (ref 65–99)
Glucose-Capillary: 203 mg/dL — ABNORMAL HIGH (ref 65–99)

## 2016-07-26 NOTE — Progress Notes (Signed)
PROGRESS NOTE  [name] [id number] DOB: Apr 13, 1934 DOA: 07/19/2016 PCP: [name], MD  Brief Narrative: 83 year old man PMH CAD, CABG, PCI with stent placement, diabetes, chronic kidney disease stage III, chronic diastolic congestive heart failure presented with chest pain. Stopped taking Lasix 3 weeks prior to presentation. Admitted for chest pain, acute on chronic diastolic congestive heart failure.  Assessment/Plan 1. Acute on chronic diastolic congestive heart failure.   Massive volume overload slowly improving. Excellent diuresis, down 18 liters since admission but still has significant volume overload.  2. Acute on chronic angina with demand ischemia. Outpatient nuclear stress test planned per cardiology. Appears stable.   Continue Imdur.  3. Diabetes mellitus type 2, better control today.  Continue Lantus present dosing and SSI  4. CKD stage III. Stable. 5. COPD. Stable. 6. Acute on chronic gout. Continue allopurinol.   7. Morbid obesity 8. Urinary retention.  Continue Foley catheter for now given aggressive diuresis.  Improving, but still with significant volume overload. Will continue IV Lasix. Check BMP in AM  DVT prophylaxis: enoxaparin Code Status: DNR Family Communication: none Disposition Plan: HH PT    [name], MD  [facility] Direct contact: [phone] --Via amion app OR  --[email or web address]; password [email or web address]  7PM-7AM contact night coverage as above 07/26/2016, 12:28 PM  LOS: 6 days   Consultants:  Cardiology   Procedures:  2-d echo Study Conclusions  - Procedure narrative: Transthoracic echocardiography. Image   quality was fair. Contrast enhancement utilized. - Left ventricle: The cavity size was normal. There was moderate   concentric hypertrophy. Systolic function was normal. The   estimated ejection fraction was in the range of 50% to 55%.   Doppler parameters are consistent with abnormal left ventricular  relaxation (grade 1 diastolic dysfunction). Doppler parameters   are consistent with high ventricular filling pressure. - Regional wall motion abnormality: Hypokinesis of the mid   anterior, mid anteroseptal, and mid inferoseptal myocardium. - Aortic valve: Trileaflet; mildly calcified leaflets. - Mitral valve: Calcified annulus. Mildly calcified leaflets . - Left atrium: The atrium was mildly dilated. - Right ventricle: Systolic function was mildly reduced.  Antimicrobials:    Interval history/Subjective: Breathing fine, eating fine, feeling better. Gout in right ankle is improving. Still has a lot of LE edema.  Objective: Vitals:   07/25/16 1150 07/25/16 1617 07/25/16 2306 07/26/16 0651  BP: (!) 170/66 (!) 136/56 (!) 131/57 (!) 150/65  Pulse: 87 71 64 70  Resp:  18 17   Temp:  98.4 F (36.9 C) 98.6 F (37 C) 97.8 F (36.6 C)  TempSrc:  Oral Oral Oral  SpO2:  97% 94% 97%  Weight:    122 kg (268 lb 15.4 oz)  Height:        Intake/Output Summary (Last 24 hours) at 07/26/16 1228 Last data filed at 07/26/16 1014  Gross per 24 hour  Intake              480 ml  Output             2000 ml  Net            -1520 ml     Filed Weights   07/24/16 0710 07/25/16 0734 07/26/16 0651  Weight: 126.6 kg (279 lb) 124 kg (273 lb 5.9 oz) 122 kg (268 lb 15.4 oz)    Exam: General: appears calm and comfortable, lying flat in bed CV: RRR no m/r/g. 3+ bilateral lower extremity edema Respiratory: CTA bilaterally, no w/r/r. Normal respiratory  effort. Psych: alert, speech fluent and clear.  I have personally reviewed the following:  UOP [place] overnight I/O -18.505 L since admission  Labs:  CBG stable  BMP: BUN and creatinine stable, 63/1.74 (BUN also high from steroids)  Imaging studies:    Medical tests:      Scheduled Meds: . allopurinol  100 mg Oral Daily  . alprazolam  1 mg Oral QHS  . aspirin EC  81 mg Oral q morning - 10a  . clopidogrel  75 mg Oral Daily   . cyclobenzaprine  10 mg Oral QHS  . diltiazem  240 mg Oral Daily  . enoxaparin (LOVENOX) injection  40 mg Subcutaneous Q24H  . furosemide  40 mg Intravenous Daily  . insulin aspart  0-9 Units Subcutaneous TID WC  . insulin glargine  100 Units Subcutaneous Daily  . isosorbide mononitrate  30 mg Oral QPM  . isosorbide mononitrate  60 mg Oral q morning - 10a  . mouth rinse  15 mL Mouth Rinse BID  . metoprolol tartrate  50 mg Oral BID  . nystatin   Topical Q8H  . predniSONE  40 mg Oral Q breakfast  . tamsulosin  0.4 mg Oral QPM  . Vitamin D (Ergocalciferol)  50,000 Units Oral Q Wed   Continuous Infusions:  Principal Problem:   Acute on chronic diastolic heart failure (HCC) Active Problems:   Coronary atherosclerosis of native coronary artery   CAD (coronary artery disease) of artery bypass graft   CKD (chronic kidney disease) stage 3, GFR 30-59 ml/min   LOS: 6 days

## 2016-07-27 DIAGNOSIS — I2510 Atherosclerotic heart disease of native coronary artery without angina pectoris: Secondary | ICD-10-CM

## 2016-07-27 LAB — BASIC METABOLIC PANEL
Anion gap: 10 (ref 5–15)
BUN: 70 mg/dL — AB (ref 6–20)
CHLORIDE: 96 mmol/L — AB (ref 101–111)
CO2: 27 mmol/L (ref 22–32)
CREATININE: 1.91 mg/dL — AB (ref 0.61–1.24)
Calcium: 8.4 mg/dL — ABNORMAL LOW (ref 8.9–10.3)
GFR calc Af Amer: 36 mL/min — ABNORMAL LOW (ref >60)
GFR calc non Af Amer: 31 mL/min — ABNORMAL LOW (ref >60)
GLUCOSE: 133 mg/dL — AB (ref 65–99)
Potassium: 4.2 mmol/L (ref 3.5–5.1)
Sodium: 133 mmol/L — ABNORMAL LOW (ref 135–145)

## 2016-07-27 LAB — GLUCOSE, CAPILLARY
Glucose-Capillary: 122 mg/dL — ABNORMAL HIGH (ref 65–99)
Glucose-Capillary: 132 mg/dL — ABNORMAL HIGH (ref 65–99)

## 2016-07-27 MED ORDER — TORSEMIDE 20 MG PO TABS: 20.0000 mg | ORAL_TABLET | Freq: Two times a day (BID) | ORAL | Status: DC

## 2016-07-27 MED ORDER — TORSEMIDE 20 MG PO TABS: 20.0000 mg | ORAL_TABLET | Freq: Two times a day (BID) | ORAL | 0 refills | Status: DC

## 2016-07-27 NOTE — Discharge Summary (Addendum)
Physician Discharge Summary  KACE HARTJE ZTI:[id number] DOB: [birth year] DOA: 07/19/2016  PCP: [name], MD  Admit date: 07/19/2016 Discharge date: 07/27/2016  Recommendations for Outpatient Follow-up:  1. Follow-up ongoing treatment for CHF 2. Follow-up acute on chronic angina with demand ischemia, consider outpatient nuclear testing. 3. Ongoing care for CKD stage III and DM type 2    Follow-up Information    Jory Sims, NP Follow up on 08/03/2016.   Specialties:  Nurse Practitioner, Radiology, Cardiology Why:  at 1:50 pm Contact information: [address] [place] [id number] [phone]          Discharge Diagnoses:  1. Acute on chronic diastolic CHF 2. Acute on chronic angina with demand ischemia 3. DM type 2 with CKD 4. CKD stage III 5. COPD 6. Acute on chronic gout, right foot 7. Morbid obesity  Discharge Condition: improved Disposition: HH PT, RN  Diet recommendation: heart healthy, diabetic diet  Filed Weights   07/24/16 0710 07/25/16 0734 07/26/16 0651  Weight: 126.6 kg (279 lb) 124 kg (273 lb 5.9 oz) 122 kg (268 lb 15.4 oz)    History of present illness:  83 year old man PMH CAD, CABG, PCI with stent placement, diabetes, chronic kidney disease stage III, chronic diastolic congestive heart failure presented with chest pain. Stopped taking Lasix 3 weeks prior to presentation. Admitted for chest pain, acute on chronic diastolic congestive heart failure.  Hospital Course:  Angina resolved with supportive care; troponins consistent with demand ischemia. Seen by cardiology with recommendation for outpatient nuclear stress testing, continue Imdur, ASA, Plavix, lopressor. Treated for massive volume overload secondary to acute diastolic CHF, on discharge -20L. Still with significant edema but patient requested d/c and was cleared by cardiology; anticipate continued improvement as an outpatient. Lasix stopped, started on torsemide. Outpatient follow-up  with cardiology arranged. PT recommended HH.  CKD stage III noted; creatinine has fluctuated; was 2-2.6 05/2014, so suspect discharge value of 1.91 close to baseline.  Consultants:  Cardiology   Procedures:  2-d echo Study Conclusions  - Procedure narrative: Transthoracic echocardiography. Image quality was fair. Contrast enhancement utilized. - Left ventricle: The cavity size was normal. There was moderate concentric hypertrophy. Systolic function was normal. The estimated ejection fraction was in the range of 50% to 55%. Doppler parameters are consistent with abnormal left ventricular relaxation (grade 1 diastolic dysfunction). Doppler parameters are consistent with high ventricular filling pressure. - Regional wall motion abnormality: Hypokinesis of the mid anterior, mid anteroseptal, and mid inferoseptal myocardium. - Aortic valve: Trileaflet; mildly calcified leaflets. - Mitral valve: Calcified annulus. Mildly calcified leaflets . - Left atrium: The atrium was mildly dilated. - Right ventricle: Systolic function was mildly reduced.  Today's assessment: S: feels much better, breathing fine. No chest pain. O: Vitals:   07/27/16 0535 07/27/16 0910  BP: 139/65 (!) 159/55  Pulse: 60 73  Resp: 19   Temp: 98.4 F (36.9 C)    General: appears calm and comfortable, sitting in chair CV: RRR no m/r/g. 2-3+ bilateral lower extremity edema Respiratory: CTA bilaterally, no w/r/r. Normal respiratory effort. Speaks in full sentences. Skin: right foot appears unremarkable.  Psychiatric: speech fluent and clear; grossly normal mood and affect.   Discharge Instructions  Discharge Instructions    Diet - low sodium heart healthy    Complete by:  As directed    Increase activity slowly    Complete by:  As directed      Allergies as of 07/27/2016      Reactions  Zocor [simvastatin] Other (See Comments)   fatigue   Lipitor [atorvastatin] Other (See Comments)    Muscle cramping      Medication List    STOP taking these medications   furosemide 40 MG tablet Commonly known as:  LASIX     TAKE these medications   allopurinol 100 MG tablet Commonly known as:  ZYLOPRIM Take 1 tablet (100 mg total) by mouth daily.   alprazolam 2 MG tablet Commonly known as:  XANAX Take 1 mg by mouth at bedtime.   aspirin EC 81 MG tablet Take 81 mg by mouth every morning.   clopidogrel 75 MG tablet Commonly known as:  PLAVIX take 1 tablet by mouth once daily WITH BREAKFAST   cyclobenzaprine 10 MG tablet Commonly known as:  FLEXERIL Take 10 mg by mouth at bedtime.   diltiazem 240 MG 24 hr capsule Commonly known as:  CARDIZEM CD Take 240 mg by mouth daily.   isosorbide mononitrate 60 MG 24 hr tablet Commonly known as:  IMDUR 60mg  1 tab in the morning & 1/2 tablet in the evening   metolazone 2.5 MG tablet Commonly known as:  ZAROXOLYN 01/29/16 change in directions Take one day for 2-3 days as needed only for weight gain of 3-5 lbs in a 48 hour period.   metoprolol tartrate 25 MG tablet Commonly known as:  LOPRESSOR Take 25 mg by mouth 2 (two) times daily.   nitroGLYCERIN 0.4 MG/SPRAY spray Commonly known as:  NITROLINGUAL use 1-2 sprays under the tongue if needed for chest pain *MAXIMUM OF 3 SPRAYS IN 15 MINUTES   polyethylene glycol powder powder Commonly known as:  GLYCOLAX/MIRALAX Take 17 g by mouth daily as needed (constipation).   psyllium 58.6 % powder Commonly known as:  METAMUCIL Take by mouth every morning.   tamsulosin 0.4 MG Caps capsule Commonly known as:  FLOMAX Take 0.4 mg by mouth every evening.   torsemide 20 MG tablet Commonly known as:  DEMADEX Take 1 tablet (20 mg total) by mouth 2 (two) times daily. Start taking on:  07/28/2016   TOUJEO SOLOSTAR 300 UNIT/ML Sopn Generic drug:  Insulin Glargine inject 100 units subcutaneously once daily   Vitamin D (Ergocalciferol) 50000 units Caps capsule Commonly known as:   DRISDOL Take 50,000 Units by mouth every Wednesday.      Allergies  Allergen Reactions  . Zocor [Simvastatin] Other (See Comments)    fatigue  . Lipitor [Atorvastatin] Other (See Comments)    Muscle cramping    The results of significant diagnostics from this hospitalization (including imaging, microbiology, ancillary and laboratory) are listed below for reference.    Significant Diagnostic Studies: Dg Chest Portable 1 View  Result Date: 07/19/2016 CLINICAL DATA:  Chest pain today radiating to left arm and jaw. EXAM: PORTABLE CHEST 1 VIEW COMPARISON:  01/17/2015 and 05/26/2014 FINDINGS: Sternotomy wires are intact. Lungs are adequately inflated demonstrate mild hazy opacification over the left base and mild prominence of the perihilar markings without significant change likely representing mild chronic vascular congestion. Cannot exclude effusion/atelectasis or infection in the left base. Stable moderate cardiomegaly. Calcified plaque over the aortic arch. Degenerative change of the spine. IMPRESSION: Cardiomegaly with findings suggesting mild chronic vascular congestion. Stable hazy opacification over the left base as cannot exclude effusion, atelectasis or infection. Consider follow-up PA and lateral chest radiograph for better evaluation of the left base. Electronically Signed   By: Marin Olp M.D.   On: 07/19/2016 22:31    Labs: Basic Metabolic  Panel:  Recent Labs Lab 07/23/16 0508 07/24/16 0553 07/25/16 0627 07/26/16 0556 07/27/16 0453  NA 135 136 133* 134* 133*  K 4.2 4.2 4.7 4.2 4.2  CL 98* 99* 98* 98* 96*  CO2 29 27 26 28 27   GLUCOSE 106* 76 223* 147* 133*  BUN 47* 53* 58* 63* 70*  CREATININE 1.80* 1.82* 1.78* 1.74* 1.91*  CALCIUM 8.7* 8.5* 8.4* 8.4* 8.4*   Cardiac Enzymes:  Recent Labs Lab 07/20/16 1353 07/20/16 1837  TROPONINI 0.08* 0.07*     Recent Labs  07/19/16 2147  BNP 227.0*    CBG:  Recent Labs Lab 07/26/16 0756 07/26/16 1137  07/26/16 1603 07/26/16 2136 07/27/16 0809  GLUCAP 127* 172* 203* 224* 122*    Principal Problem:   Acute on chronic diastolic heart failure (HCC) Active Problems:   Coronary atherosclerosis of native coronary artery   CAD (coronary artery disease) of artery bypass graft   CKD (chronic kidney disease) stage 3, GFR 30-59 ml/min   Time coordinating discharge: 35 minutes  Signed:  [name], MD [facility] 07/27/2016, 11:07 AM

## 2016-07-27 NOTE — Progress Notes (Signed)
Patient ambulated in hallway, using front wheel walker times one assist. Patient tolerated ambulation without difficulty.Room air saturation while ambulating was 97 percent. Will continue to monitor patient.

## 2016-07-27 NOTE — Care Management Note (Signed)
Case Management Note  Patient Details  Name: HAO DION MRN: [id number] Date of Birth: 10-15-1933  Expected Discharge Date:  07/21/16               Expected Discharge Plan:  Home/Self Care  In-House Referral:  NA  Discharge planning Services  CM Consult  Post Acute Care Choice:  NA Choice offered to:  NA  Status of Service: Completed.   Additional Comments: Pt discharging home today. Pt continues to refuse all HH services and [facility] services. Pt has HS oxygen and is tolerating room air at present.  Sherald Barge, RN 07/27/2016, 11:52 AM

## 2016-07-27 NOTE — Progress Notes (Signed)
Progress Note  Patient Name: [name] Date of Encounter: 07/27/2016   Subjective   No SOB, no chest pain Inpatient Medications    Scheduled Meds: . allopurinol  100 mg Oral Daily  . alprazolam  1 mg Oral QHS  . aspirin EC  81 mg Oral q morning - 10a  . clopidogrel  75 mg Oral Daily  . cyclobenzaprine  10 mg Oral QHS  . diltiazem  240 mg Oral Daily  . enoxaparin (LOVENOX) injection  40 mg Subcutaneous Q24H  . furosemide  40 mg Intravenous Daily  . insulin aspart  0-9 Units Subcutaneous TID WC  . insulin glargine  100 Units Subcutaneous Daily  . isosorbide mononitrate  30 mg Oral QPM  . isosorbide mononitrate  60 mg Oral q morning - 10a  . mouth rinse  15 mL Mouth Rinse BID  . metoprolol tartrate  50 mg Oral BID  . nystatin   Topical Q8H  . tamsulosin  0.4 mg Oral QPM  . Vitamin D (Ergocalciferol)  50,000 Units Oral Q Wed   Continuous Infusions:  PRN Meds: acetaminophen, hydrALAZINE, ondansetron (ZOFRAN) IV, polyethylene glycol   Vital Signs    Vitals:   07/26/16 0651 07/26/16 1509 07/26/16 2149 07/27/16 0535  BP: (!) 150/65 107/88 (!) 119/49 139/65  Pulse: 70 81 60 60  Resp:  18 18 19   Temp: 97.8 F (36.6 C) 98 F (36.7 C) 98.4 F (36.9 C) 98.4 F (36.9 C)  TempSrc: Oral Oral Oral Oral  SpO2: 97% 96% 95% 96%  Weight: 268 lb 15.4 oz (122 kg)     Height:        Intake/Output Summary (Last 24 hours) at 07/27/16 0909 Last data filed at 07/27/16 0500  Gross per 24 hour  Intake              480 ml  Output             3100 ml  Net            -2620 ml   Filed Weights   07/24/16 0710 07/25/16 0734 07/26/16 0651  Weight: 279 lb (126.6 kg) 273 lb 5.9 oz (124 kg) 268 lb 15.4 oz (122 kg)    Telemetry     - Personally Reviewed  ECG    - Personally Reviewed  Physical Exam   GEN: No acute distress.   Neck: No JVD Cardiac: RRR, no murmurs, rubs, or gallops.  Respiratory: Clear to auscultation bilaterally. GI: Soft, nontender, non-distended  MS:  No edema; No deformity. Neuro:  Nonfocal  Psych: Normal affect  MSK: 2+ bialteral LE edema  Labs    Chemistry Recent Labs Lab 07/25/16 0627 07/26/16 0556 07/27/16 0453  NA 133* 134* 133*  K 4.7 4.2 4.2  CL 98* 98* 96*  CO2 26 28 27   GLUCOSE 223* 147* 133*  BUN 58* 63* 70*  CREATININE 1.78* 1.74* 1.91*  CALCIUM 8.4* 8.4* 8.4*  GFRNONAA 34* 35* 31*  GFRAA 39* 41* 36*  ANIONGAP 9 8 10      HematologyNo results for input(s): WBC, RBC, HGB, HCT, MCV, MCH, MCHC, RDW, PLT in the last 168 hours.  Cardiac Enzymes Recent Labs Lab 07/20/16 1353 07/20/16 1837  TROPONINI 0.08* 0.07*   No results for input(s): TROPIPOC in the last 168 hours.   BNPNo results for input(s): BNP, PROBNP in the last 168 hours.   DDimer No results for input(s): DDIMER in the last 168 hours.   Radiology  No results found.  Cardiac Studies     Patient Profile     83 y.o. male with CAD (CABGx4 in 1994, prior stent to [place], DES to native Cx 2006, DES to SVG-RCA 04/2011, STEMI s/p DES to SVG to PDA 2014, NSTEMI 05/2014 with nonischemic nuc treated medically), chronic angina, HTN, anxiety, depression, GERD, HLD (with history of statin intolerance), morbid obesity, OSA, DM, chronic diastolic CHF, suspected CKD III, admitted with worsened chest pain and weight gain. + recent med noncompliance and possible dietary noncompliance in setting of his wife (caregiver) recently having her own health issues. 2D Echo 07/20/16: EF 50-55%, grade 1 DD, +WMA, mildly reduced RV function.   Assessment & Plan   1. Acute on chronic angina with complex CAD - complex CAD history as described above - history of chronic angina, recent medication noncompliance - recent symptoms possibly due to medication noncompliance as well as fluid overload - started back on medical therapy: ASA, plavix, diltiazem 240, imdur 60am 30pm lopressor 50 bid. Has statin allergy.  No ACE due to poor renal function - peak trop 0.13 in setting of  severe acute on chronic diastolic HF and CKD.  - consisder outpatient stress testing. High threshold for cath given his renal dysfunction  2. Acute on chronic diastolic HF - 07/1636 echo LVEF 45-36%, grade I diastolic dysfunction - admit weight 287, baselne 271 - negative 2.3 liters yesterday, negative 21 L since admission. He is on lasix 40mg  IV daily. Uptrend in Cr. Fairly labile baseline over the years ranging 1-2. Admit Cr around 1.5-1.6   He is very anxious to get home to see his wife who was also recently hospitalized. Will ask nursing staff to ambulate patient, depending on symptoms can consider discharge with f/u in 1 week in our clinic. Would change oral regimen to torsemide  20mg  bid. Will needs labs next week as well. I have spoke with our office to arrange.     Merrily Pew, MD  07/27/2016, 9:09 AM

## 2016-07-27 NOTE — Care Management Important Message (Signed)
Important Message  Patient Details  Name: [name] MRN: [id number] Date of Birth: Aug 18, 1933   Medicare Important Message Given:  Yes    Sherald Barge, RN 07/27/2016, 11:52 AM

## 2016-07-27 NOTE — Progress Notes (Signed)
IV discontinued catheter intact. Discharge instructions given on medications,and follow up visits, patient and family verbalized understanding. Prescription sent to Pharmacy of choice documented on AVS. Staff to accompanied patient to awaiting vehicle.

## 2016-08-03 ENCOUNTER — Encounter: Payer: Self-pay | Admitting: Adult Health

## 2016-08-03 ENCOUNTER — Ambulatory Visit (INDEPENDENT_AMBULATORY_CARE_PROVIDER_SITE_OTHER): Payer: PPO | Admitting: Adult Health

## 2016-08-03 VITALS — BP 134/74 | HR 84 | Ht 64.0 in | Wt 271.0 lb

## 2016-08-03 DIAGNOSIS — I10 Essential (primary) hypertension: Secondary | ICD-10-CM

## 2016-08-03 DIAGNOSIS — Z79899 Other long term (current) drug therapy: Secondary | ICD-10-CM | POA: Diagnosis not present

## 2016-08-03 DIAGNOSIS — I5032 Chronic diastolic (congestive) heart failure: Secondary | ICD-10-CM | POA: Diagnosis not present

## 2016-08-03 DIAGNOSIS — I2510 Atherosclerotic heart disease of native coronary artery without angina pectoris: Secondary | ICD-10-CM | POA: Diagnosis not present

## 2016-08-03 MED ORDER — METOPROLOL TARTRATE 25 MG PO TABS: 25.0000 mg | ORAL_TABLET | Freq: Two times a day (BID) | ORAL | 3 refills | Status: DC

## 2016-08-03 MED ORDER — ISOSORBIDE MONONITRATE ER 30 MG PO TB24: 30.0000 mg | ORAL_TABLET | Freq: Every day | ORAL | 3 refills | Status: DC

## 2016-08-03 MED ORDER — TORSEMIDE 20 MG PO TABS: 20.0000 mg | ORAL_TABLET | Freq: Two times a day (BID) | ORAL | 3 refills | Status: DC

## 2016-08-03 MED ORDER — CLOPIDOGREL BISULFATE 75 MG PO TABS: ORAL_TABLET | ORAL | 1 refills | Status: DC

## 2016-08-03 NOTE — Patient Instructions (Addendum)
Medication Instructions:  DECREASE ISOSORBIDE TO 30 MG DAILY  I HAVE REFILLED ALL CARDIAC MEDICATIONS   Labwork: 1 WEEK :  BMET   Testing/Procedures: NONE  Follow-Up: Your physician recommends that you schedule a follow-up appointment in: June WITH DR. MCDOWELL    Any Other Special Instructions Will Be Listed Below (If Applicable).  CONTINUE WITH DAILY WEIGHTS    If you need a refill on your cardiac medications before your next appointment, please call your pharmacy.

## 2016-08-03 NOTE — Progress Notes (Signed)
Cardiology Office Note   Date:  08/03/2016   ID:  [name], DOB 03-23-1934, MRN [id number]  PCP:  [name], MD  Cardiologist: [place]/  Jory Sims, NP   Chief Complaint  Patient presents with  . Congestive Heart Failure  . Coronary Artery Disease      History of Present Illness: [name] is a 83 y.o. male who presents for posthospitalization follow-up after admission for acute on chronic diastolic heart failure, with known history of coronary artery disease, CAD (CABGx4 in 1994, prior stent to [place], DES to native Cx 2006, DES to SVG-RCA 04/2011, STEMI s/p DES to SVG to PDA 2014, NSTEMI 05/2014 , with demand ischemia noted during hospitalization, hypertension, chronic kidney disease 3. The patient was diuresed 20 L, but continued to have some mild lower extremity edema. He was started on torsemide, sent home with home health nurses and physical therapy.  Discharge weight 268 pounds.  Echocardiogram 07/23/2016  - Procedure narrative: Transthoracic echocardiography. Image quality was fair. Contrast enhancement utilized. - Left ventricle: The cavity size was normal. There was moderate concentric hypertrophy. Systolic function was normal. The estimated ejection fraction was in the range of 50% to 55%. Doppler parameters are consistent with abnormal left ventricular relaxation (grade 1 diastolic dysfunction). Doppler parameters are consistent with high ventricular filling pressure. - Regional wall motion abnormality: Hypokinesis of the mid anterior, mid anteroseptal, and mid inferoseptal myocardium. - Aortic valve: Trileaflet; mildly calcified leaflets. - Mitral valve: Calcified annulus. Mildly calcified leaflets . - Left atrium: The atrium was mildly dilated. - Right ventricle: Systolic function was mildly reduced.  He is doing very well since discharge and has continued to weigh himself, avoid salt, and is medically complaint. He has lost and  additional 5 lbs according to his home scale. He states he feels good and has no complaints of chest pain, dyspnea, or LEE.  Past Medical History:  Diagnosis Date  . Allergic rhinitis   . Anxiety   . Arthritis   . COPD (chronic obstructive pulmonary disease) ([place])   . Coronary atherosclerosis of native coronary artery    a. CABG x 4 in 1994. Previous stent placement to SVG to distal RCA;  b. DES to native Cx in 2006;  c. DES SVG to RCA 05/08/11; d. Inf MI/Cath/PCI: LM 25, LAD 100, LCX patent stent, 50 into OM, RI 60-70, RCA 100, LIMA->LAD nl, VG->Diag 100 old, VG->RCA/PL 99 (DES x 1), EF 50-55%.  . Depression    With anxious features/hx panic attacks  . Essential hypertension, benign   . GERD (gastroesophageal reflux disease)   . Hyperlipidemia    Not on statin 2/2 hx of intolerance.   . Morbid obesity ([place])   . Nephrolithiasis   . Pneumonia   . Sleep apnea   . ST elevation myocardial infarction (STEMI) of inferior wall ([place])    4/14 - DES SVG to PDA  . Type 2 diabetes mellitus ([place])     Past Surgical History:  Procedure Laterality Date  . CORONARY ARTERY BYPASS GRAFT  1994  . EYE SURGERY    . LEFT HEART CATHETERIZATION WITH CORONARY ANGIOGRAM N/A 05/08/2011   Procedure: LEFT HEART CATHETERIZATION WITH CORONARY ANGIOGRAM;  Surgeon: [name], MD;  Location: [facility] CATH LAB;  Service: Cardiovascular;  Laterality: N/A;  . LEFT HEART CATHETERIZATION WITH CORONARY ANGIOGRAM N/A 08/08/2012   Procedure: LEFT HEART CATHETERIZATION WITH CORONARY ANGIOGRAM;  Surgeon: [name], MD;  Location: [facility] CATH LAB;  Service: Cardiovascular;  Laterality: N/A;  . PERCUTANEOUS CORONARY STENT INTERVENTION (PCI-S) Right 05/08/2011   Procedure: PERCUTANEOUS CORONARY STENT INTERVENTION (PCI-S);  Surgeon: [name], MD;  Location: [facility] CATH LAB;  Service: Cardiovascular;  Laterality: Right;  . PERCUTANEOUS CORONARY STENT INTERVENTION (PCI-S) N/A 08/08/2012   Procedure: PERCUTANEOUS CORONARY  STENT INTERVENTION (PCI-S);  Surgeon: [name], MD;  Location: [facility] CATH LAB;  Service: Cardiovascular;  Laterality: N/A;     Current Outpatient Prescriptions  Medication Sig Dispense Refill  . allopurinol (ZYLOPRIM) 100 MG tablet Take 1 tablet (100 mg total) by mouth daily. 30 tablet 2  . alprazolam (XANAX) 2 MG tablet Take 1 mg by mouth at bedtime.     Marland Kitchen aspirin EC 81 MG tablet Take 81 mg by mouth every morning.    . clopidogrel (PLAVIX) 75 MG tablet take 1 tablet by mouth once daily WITH BREAKFAST 90 tablet 1  . cyclobenzaprine (FLEXERIL) 10 MG tablet Take 10 mg by mouth at bedtime.     Marland Kitchen diltiazem (CARDIZEM CD) 240 MG 24 hr capsule Take 240 mg by mouth daily.    . metolazone (ZAROXOLYN) 2.5 MG tablet 01/29/16 change in directions Take one day for 2-3 days as needed only for weight gain of 3-5 lbs in a 48 hour period. 15 tablet 1  . metoprolol tartrate (LOPRESSOR) 25 MG tablet Take 1 tablet (25 mg total) by mouth 2 (two) times daily. 180 tablet 3  . nitroGLYCERIN (NITROLINGUAL) 0.4 MG/SPRAY spray use 1-2 sprays under the tongue if needed for chest pain *MAXIMUM OF 3 SPRAYS IN 15 MINUTES 25 g 3  . polyethylene glycol powder (GLYCOLAX/MIRALAX) powder Take 17 g by mouth daily as needed (constipation).     . psyllium (METAMUCIL) 58.6 % powder Take by mouth every morning.    . tamsulosin (FLOMAX) 0.4 MG CAPS capsule Take 0.4 mg by mouth every evening.     . torsemide (DEMADEX) 20 MG tablet Take 1 tablet (20 mg total) by mouth 2 (two) times daily. 180 tablet 3  . TOUJEO SOLOSTAR 300 UNIT/ML SOPN inject 100 units subcutaneously once daily  0  . Vitamin D, Ergocalciferol, (DRISDOL) 50000 UNITS CAPS capsule Take 50,000 Units by mouth every Wednesday.  0  . isosorbide mononitrate (IMDUR) 30 MG 24 hr tablet Take 1 tablet (30 mg total) by mouth daily. 90 tablet 3   No current facility-administered medications for this visit.     Allergies:   Zocor [simvastatin] and Lipitor  [atorvastatin]    Social History:  The patient  reports that he has never smoked. He has never used smokeless tobacco. He reports that he does not drink alcohol or use drugs.   Family History:  The patient's family history includes Heart attack in his mother.    ROS: All other systems are reviewed and negative. Unless otherwise mentioned in H&P    PHYSICAL EXAM: VS:  BP 134/74   Pulse 84   Ht 5\' 4"  (1.626 m)   Wt 271 lb (122.9 kg)   SpO2 92%   BMI 46.52 kg/m  , BMI Body mass index is 46.52 kg/m. GEN: Well nourished, well developed, in no acute distress  HEENT: normal  Neck: no JVD, carotid bruits, or masses Cardiac: RRR; 1/6 systolic  murmurs, rubs, or gallops,no edema  Respiratory: Clear to auscultation bilaterally, normal work of breathing.  GI: soft, nontender, nondistended, + BS. Abdomen obese. MS: no deformity or atrophy Uses walker for ambulation.  Skin: warm and dry, no rash Neuro:  Strength and sensation are intact Psych: euthymic mood, full affect  Recent Labs: 07/19/2016: ALT 15; B Natriuretic Peptide 227.0 07/20/2016: Hemoglobin 13.1; Platelets 157 07/27/2016: BUN 70; Creatinine, Ser 1.91; Potassium 4.2; Sodium 133    Lipid Panel    Component Value Date/Time   CHOL 185 05/25/2014 0549   TRIG 92 05/25/2014 0549   HDL 44 05/25/2014 0549   CHOLHDL 4.2 05/25/2014 0549   VLDL 18 05/25/2014 0549   LDLCALC 123 (H) 05/25/2014 0549      Wt Readings from Last 3 Encounters:  08/03/16 271 lb (122.9 kg)  07/26/16 268 lb 15.4 oz (122 kg)  06/02/16 271 lb (122.9 kg)      Other studies Reviewed: Hemodynamic Findings: Central aortic pressure: 149/63 Left ventricular pressure: 169/9/21  Angiographic Findings:  Left mainstem: Long mid and distal calcified 25% stenosis.   Left anterior descending (LAD): Diffuse proximal vessel disease. 100% occlusion in the mid vessel. Heavy calcification. The distal vessel fills via the LIMA and appears to be free of high-grade  disease. There is a diagonal that is occluded and no longer fills via the vein graft.   Left circumflex (LCx): There is a ramus intermediate that is moderate-sized. It is branching. There is moderate calcification. There is diffuse 60-70% stenosis in a somewhat narrow caliber vessel which is unchanged from last cath.  The proximal AV groove has a stent with mild luminal irregularities but is otherwise widely patent. There is a step off in the caliber of the vessel immediately after the stent with long 50% stenosis leading into a large obtuse marginal. The obtuse marginal is free of high-grade disease.   Right coronary artery (RCA): Occluded at the proximal segment. The distal vessel fills via the sequential vein graft.   Graft Anatomy:  LIMA to LAD: Widely patent  SVG to Diag:  Known to be occluded (as demonstrated in the previous catheterization)  SVG to distal RCA/PL: The proximal stents are widely patent. The mid stented segment in the body of SVG has a 99% hazy stenosis    Left ventriculography: Left ventricular systolic function is normal with LVEF 50-55%, inferoapical hypokinesis.   Impression: 1. Acute inferior STEMI secondary to thrombotic lesion in mid body of SVG to PDA, now s/p successful PTCA/DES x 1 mid body of SVG to PDA 2. Preserved LV systolic function 3. Triple vessel CAD s/p 5V CABG with 3/5 patent bypass grafts  ASSESSMENT AND PLAN:  1.  Chronic Diastolic CHF:  He is doing very well. Continues to be medically compliant, weigh daily, and avoid salt. He denies symptoms. Tolerating torsemide.  Will repeat BMET to evaluate kidney function and potassium status.   2. CAD: CABG with stents as described above. He will continue on DAPT, metoprolol,. He is intolerant to statins. Consider pharmacist evaluation in lipid clinic.   3. Hypertension: BP is well controlled. No changes in regimen.    Current medicines are reviewed at length with the patient today.    Labs/ tests  ordered today include:   Orders Placed This Encounter  Procedures  . Basic Metabolic Panel (BMET)     Disposition:   FU with in [place] with Dr. [name].   Signed, Jory Sims, NP  08/03/2016 4:14 PM    [facility] [address], [place], [place] [address] Phone: [phone]; Fax: [phone]

## 2016-08-10 ENCOUNTER — Other Ambulatory Visit (HOSPITAL_COMMUNITY)
Admission: RE | Admit: 2016-08-10 | Discharge: 2016-08-10 | Disposition: A | Discharge status: Home / Self Care | Payer: PPO | Source: Ambulatory Visit | Attending: Adult Health | Admitting: Adult Health

## 2016-08-10 DIAGNOSIS — Z79899 Other long term (current) drug therapy: Secondary | ICD-10-CM | POA: Diagnosis not present

## 2016-08-10 LAB — BASIC METABOLIC PANEL
Anion gap: 10 (ref 5–15)
BUN: 51 mg/dL — ABNORMAL HIGH (ref 6–20)
CHLORIDE: 96 mmol/L — AB (ref 101–111)
CO2: 32 mmol/L (ref 22–32)
CREATININE: 2.21 mg/dL — AB (ref 0.61–1.24)
Calcium: 9.7 mg/dL (ref 8.9–10.3)
GFR calc non Af Amer: 26 mL/min — ABNORMAL LOW (ref >60)
GFR, EST AFRICAN AMERICAN: 30 mL/min — AB (ref >60)
Glucose, Bld: 231 mg/dL — ABNORMAL HIGH (ref 65–99)
POTASSIUM: 4.3 mmol/L (ref 3.5–5.1)
SODIUM: 138 mmol/L (ref 135–145)

## 2016-08-11 ENCOUNTER — Telehealth: Payer: Self-pay | Admitting: *Deleted

## 2016-08-11 MED ORDER — TORSEMIDE 20 MG PO TABS: ORAL_TABLET | ORAL | 6 refills | Status: DC

## 2016-08-11 NOTE — Telephone Encounter (Signed)
-----   Message from Lendon Colonel, NP sent at 08/10/2016  4:54 PM EDT ----- Creatinine elevated. Please change his torsemide dose to two tablets daily, alternating with one tablet daily.  Continue to use metolazone for sudden weight gain only.  Glucose elevated. Will need to follow up with PCP.

## 2016-08-18 ENCOUNTER — Other Ambulatory Visit: Payer: Self-pay

## 2016-08-18 NOTE — Patient Outreach (Addendum)
[place] [facility]) Care Management  08/18/2016  Danyon Mcginness Keech [birth year] [id number]     EMMI- CHF RED ON EMMI ALERT Day # 20 Date: 08/17/16 Red Alert Reason: "Weight: 270 lbs, previous weight 267 lbs"    Outreach attempt #1 to patient. No answer. RN CM left HIPAA compliant message along with contact info.         Plan: RN CM will make outreach attempt to patient within one business day if no return call from patient.   [name], RN,BSN,CCM [place] [facility] Telephonic Care Management Coordinator Direct Phone: [phone] Toll Free: [phone] Fax: [phone]

## 2016-08-19 ENCOUNTER — Other Ambulatory Visit: Payer: Self-pay

## 2016-08-19 ENCOUNTER — Telehealth: Payer: Self-pay | Admitting: Cardiology

## 2016-08-19 DIAGNOSIS — R0902 Hypoxemia: Secondary | ICD-10-CM | POA: Diagnosis not present

## 2016-08-19 DIAGNOSIS — G4733 Obstructive sleep apnea (adult) (pediatric): Secondary | ICD-10-CM | POA: Diagnosis not present

## 2016-08-19 NOTE — Telephone Encounter (Signed)
Was told to call and let us know that he has gained 3 lbs

## 2016-08-19 NOTE — Telephone Encounter (Signed)
Pt says he ate more this past weekend and thinks this may be causing 3 lbs weight gain in the last 2 days - denies SOB/dizziness/increased swelling - pt hasn't taken metolazone this week and will take today and update [place] on weight tomorrow - routed to provider FYI

## 2016-08-19 NOTE — Patient Outreach (Signed)
[place] [facility]) Care Management  08/19/2016  [name] February 20, 1934 [id number]   EMMI- CHF RED ON EMMI ALERT Day # 20 Date: 08/17/16 Red Alert Reason: "Weight: 270 lbs, previous weight 267 lbs"    Outreach attempt #2 to patient. Spoke with patient who stated he is doing good. Reviewed and addressed red alert. Patient confirmed that his weight was 270 lbs on Monday. He states that he has weighed this morning and his weight remains at 270 lbs. He denies any edema, SOB or other symptoms. Patient reports that he is taking diuretic as ordered. He states that he did not alert MD office of three pound weight gain as he was not aware that he was supposed to. RN CM educated patient on possible s/s of worsening condition including weight gain of 3 pounds in 1 day and 5 pounds in one week. Advised patient to contact MD office to alert of weight gain and to see what they wanted him to do. Patient voiced understanding and will call MD office right away. He reports that he has completed f/u appts. He goes back to cardiologist sometime next month. Denies any issues with transportation and or meds. He voices no further RN CM needs or concerns at this time. Advised patient that they would continue to get automated EMMI- HF post discharge calls to assess how they are doing following recent hospitalization and will receive a call from a nurse if any of their responses were abnormal. Patient voiced understanding and was appreciative of f/u call.    Plan:  RN CM will notify [facility] administrative assistant of case status. RN CM will send EMMI HF educational information to patient via mail.    [name], RN,BSN,CCM [place] [facility] Telephonic Care Management Coordinator Direct Phone: [phone] Toll Free: [phone] Fax: [phone]

## 2016-08-26 DIAGNOSIS — F419 Anxiety disorder, unspecified: Secondary | ICD-10-CM | POA: Diagnosis not present

## 2016-08-26 DIAGNOSIS — N183 Chronic kidney disease, stage 3 (moderate): Secondary | ICD-10-CM | POA: Diagnosis not present

## 2016-08-26 DIAGNOSIS — Z6841 Body Mass Index (BMI) 40.0 and over, adult: Secondary | ICD-10-CM | POA: Diagnosis not present

## 2016-08-26 DIAGNOSIS — I10 Essential (primary) hypertension: Secondary | ICD-10-CM | POA: Diagnosis not present

## 2016-08-26 DIAGNOSIS — E1165 Type 2 diabetes mellitus with hyperglycemia: Secondary | ICD-10-CM | POA: Diagnosis not present

## 2016-08-26 DIAGNOSIS — E782 Mixed hyperlipidemia: Secondary | ICD-10-CM | POA: Diagnosis not present

## 2016-08-26 DIAGNOSIS — I5032 Chronic diastolic (congestive) heart failure: Secondary | ICD-10-CM | POA: Diagnosis not present

## 2016-08-26 DIAGNOSIS — R609 Edema, unspecified: Secondary | ICD-10-CM | POA: Diagnosis not present

## 2016-08-31 DIAGNOSIS — Z0001 Encounter for general adult medical examination with abnormal findings: Secondary | ICD-10-CM | POA: Diagnosis not present

## 2016-08-31 DIAGNOSIS — F419 Anxiety disorder, unspecified: Secondary | ICD-10-CM | POA: Diagnosis not present

## 2016-08-31 DIAGNOSIS — E1165 Type 2 diabetes mellitus with hyperglycemia: Secondary | ICD-10-CM | POA: Diagnosis not present

## 2016-08-31 DIAGNOSIS — Z6841 Body Mass Index (BMI) 40.0 and over, adult: Secondary | ICD-10-CM | POA: Diagnosis not present

## 2016-08-31 DIAGNOSIS — M1990 Unspecified osteoarthritis, unspecified site: Secondary | ICD-10-CM | POA: Diagnosis not present

## 2016-08-31 DIAGNOSIS — G4733 Obstructive sleep apnea (adult) (pediatric): Secondary | ICD-10-CM | POA: Diagnosis not present

## 2016-08-31 DIAGNOSIS — I5032 Chronic diastolic (congestive) heart failure: Secondary | ICD-10-CM | POA: Diagnosis not present

## 2016-08-31 DIAGNOSIS — I10 Essential (primary) hypertension: Secondary | ICD-10-CM | POA: Diagnosis not present

## 2016-08-31 DIAGNOSIS — E782 Mixed hyperlipidemia: Secondary | ICD-10-CM | POA: Diagnosis not present

## 2016-09-19 DIAGNOSIS — R0902 Hypoxemia: Secondary | ICD-10-CM | POA: Diagnosis not present

## 2016-09-19 DIAGNOSIS — G4733 Obstructive sleep apnea (adult) (pediatric): Secondary | ICD-10-CM | POA: Diagnosis not present

## 2016-10-05 DIAGNOSIS — G4733 Obstructive sleep apnea (adult) (pediatric): Secondary | ICD-10-CM | POA: Diagnosis not present

## 2016-10-07 NOTE — Progress Notes (Signed)
Cardiology Office Note  Date: 10/08/2016   ID: [name], DOB 1934/01/21, MRN [id number]  PCP: [name], MD  Primary Cardiologist: [name], MD   Chief Complaint  Patient presents with  . Coronary Artery Disease    History of Present Illness: [name] is an 83 y.o. male last seen by Ms. Lawrence DNP in April Following hospitalization with acute on chronic diastolic heart failure. Peak troponin I level was only 0.13 at that time and he was treated with medical therapy, improved with diuresis. He reports overall stable weights and no progressive shortness of breath since April. I reviewed his medications and we discussed compliance. He states that he has been taking them regularly.  Follow-up echocardiogram in April revealed LVEF 50-55% with grade 1 diastolic dysfunction. Also has mildly reduced right ventricular contraction.  Weight is up by only 2 pounds compared to last visit. He continues on standing Demadex and has Zaroxolyn available as well.  Past Medical History:  Diagnosis Date  . Allergic rhinitis   . Anxiety   . Arthritis   . COPD (chronic obstructive pulmonary disease) ([place])   . Coronary atherosclerosis of native coronary artery    a. CABG x 4 in 1994. Previous stent placement to SVG to distal RCA;  b. DES to native Cx in 2006;  c. DES SVG to RCA 05/08/11; d. Inf MI/Cath/PCI: LM 25, LAD 100, LCX patent stent, 50 into OM, RI 60-70, RCA 100, LIMA->LAD nl, VG->Diag 100 old, VG->RCA/PL 99 (DES x 1), EF 50-55%.  . Depression    With anxious features/hx panic attacks  . Essential hypertension, benign   . GERD (gastroesophageal reflux disease)   . Hyperlipidemia    Not on statin 2/2 hx of intolerance.   . Morbid obesity ([place])   . Nephrolithiasis   . Pneumonia   . Sleep apnea   . ST elevation myocardial infarction (STEMI) of inferior wall ([place])    4/14 - DES SVG to PDA  . Type 2 diabetes mellitus ([place])     Past Surgical History:  Procedure  Laterality Date  . CORONARY ARTERY BYPASS GRAFT  1994  . EYE SURGERY    . LEFT HEART CATHETERIZATION WITH CORONARY ANGIOGRAM N/A 05/08/2011   Procedure: LEFT HEART CATHETERIZATION WITH CORONARY ANGIOGRAM;  Surgeon: [name], MD;  Location[facility] CATH LAB;  Service: Cardiovascular;  Laterality: N/A;  . LEFT HEART CATHETERIZATION WITH CORONARY ANGIOGRAM N/A 08/08/2012   Procedure: LEFT HEART CATHETERIZATION WITH CORONARY ANGIOGRAM;  Surgeon: [name], MD;  Location: [facility] CATH LAB;  Service: Cardiovascular;  Laterality: N/A;  . PERCUTANEOUS CORONARY STENT INTERVENTION (PCI-S) Right 05/08/2011   Procedure: PERCUTANEOUS CORONARY STENT INTERVENTION (PCI-S);  Surgeon: [name], MD;  Location: [facility] CATH LAB;  Service: Cardiovascular;  Laterality: Right;  . PERCUTANEOUS CORONARY STENT INTERVENTION (PCI-S) N/A 08/08/2012   Procedure: PERCUTANEOUS CORONARY STENT INTERVENTION (PCI-S);  Surgeon: [name], MD;  Location: [facility] CATH LAB;  Service: Cardiovascular;  Laterality: N/A;    Current Outpatient Prescriptions  Medication Sig Dispense Refill  . allopurinol (ZYLOPRIM) 100 MG tablet Take 1 tablet (100 mg total) by mouth daily. 30 tablet 2  . alprazolam (XANAX) 2 MG tablet Take 1 mg by mouth at bedtime.     Marland Kitchen aspirin EC 81 MG tablet Take 81 mg by mouth every morning.    . clopidogrel (PLAVIX) 75 MG tablet take 1 tablet by mouth once daily WITH BREAKFAST 90 tablet 1  .  cyclobenzaprine (FLEXERIL) 10 MG tablet Take 10 mg by mouth at bedtime.     Marland Kitchen diltiazem (CARDIZEM CD) 240 MG 24 hr capsule Take 240 mg by mouth daily.    . isosorbide mononitrate (IMDUR) 30 MG 24 hr tablet Take 1 tablet (30 mg total) by mouth daily. 90 tablet 3  . metolazone (ZAROXOLYN) 2.5 MG tablet 01/29/16 change in directions Take one day for 2-3 days as needed only for weight gain of 3-5 lbs in a 48 hour period. 15 tablet 1  . metoprolol tartrate (LOPRESSOR) 25 MG tablet Take 1 tablet (25 mg total) by  mouth 2 (two) times daily. 180 tablet 3  . nitroGLYCERIN (NITROLINGUAL) 0.4 MG/SPRAY spray use 1-2 sprays under the tongue if needed for chest pain *MAXIMUM OF 3 SPRAYS IN 15 MINUTES 25 g 3  . polyethylene glycol powder (GLYCOLAX/MIRALAX) powder Take 17 g by mouth daily as needed (constipation).     . psyllium (METAMUCIL) 58.6 % powder Take by mouth every morning.    . tamsulosin (FLOMAX) 0.4 MG CAPS capsule Take 0.4 mg by mouth every evening.     . torsemide (DEMADEX) 20 MG tablet Take Two Tablets ( 40 mg )  Daily Alternating with One Tablet (20 mg) Daily 45 tablet 6  . TOUJEO SOLOSTAR 300 UNIT/ML SOPN inject 100 units subcutaneously once daily  0   No current facility-administered medications for this visit.    Allergies:  Zocor [simvastatin] and Lipitor [atorvastatin]   Social History: The patient  reports that he has never smoked. He has never used smokeless tobacco. He reports that he does not drink alcohol or use drugs.   ROS:  Please see the history of present illness. Otherwise, complete review of systems is positive for hearing loss, arthritic pain.  All other systems are reviewed and negative.   Physical Exam: VS:  BP (!) 162/75   Pulse 69   Ht 5\' 4"  (1.626 m)   Wt 273 lb 9.6 oz (124.1 kg)   SpO2 92%   BMI 46.96 kg/m , BMI Body mass index is 46.96 kg/m.  Wt Readings from Last 3 Encounters:  10/08/16 273 lb 9.6 oz (124.1 kg)  08/03/16 271 lb (122.9 kg)  07/26/16 268 lb 15.4 oz (122 kg)    Morbidly obese male in no acute distress.Using a cane. HEENT: Conjunctiva and lids normal, oropharynx clear.  Neck: Supple, no elevated JVP or carotid bruits, no thyromegaly.  Lungs: Clear to auscultation, nonlabored breathing at rest.  Cardiac: Regular rate and rhythm with ectopy, no S3, no pericardial rub.  Abdomen: Obese, protuberant, bowel sounds present, no guarding or rebound.  Extremities: Chronic appearing mild leg edema, distal pulses 1-2+. Skin: Warm and  dry. Musculoskeletal: No kyphosis. Circumflex neuropsychiatric: Alert and oriented 3, affect appropriate.  ECG: I personally reviewed the tracing from 07/23/2016 which showed sinus rhythm with PACs, old anterolateral infarct pattern.  Recent Labwork: 07/19/2016: ALT 15; AST 7; B Natriuretic Peptide 227.0 07/20/2016: Hemoglobin 13.1; Platelets 157 08/10/2016: BUN 51; Creatinine, Ser 2.21; Potassium 4.3; Sodium 138     Component Value Date/Time   CHOL 185 05/25/2014 0549   TRIG 92 05/25/2014 0549   HDL 44 05/25/2014 0549   CHOLHDL 4.2 05/25/2014 0549   VLDL 18 05/25/2014 0549   LDLCALC 123 (H) 05/25/2014 0549    Other Studies Reviewed Today:  Echocardiogram 07/20/2016: Study Conclusions  - Procedure narrative: Transthoracic echocardiography. Image   quality was fair. Contrast enhancement utilized. - Left ventricle: The cavity size  was normal. There was moderate    concentric hypertrophy. Systolic function was normal. The   estimated ejection fraction was in the range of 50% to 55%.   Doppler parameters are consistent with abnormal left ventricular   relaxation (grade 1 diastolic dysfunction). Doppler parameters   are consistent with high ventricular filling pressure. - Regional wall motion abnormality: Hypokinesis of the mid   anterior, mid anteroseptal, and mid inferoseptal myocardium. - Aortic valve: Trileaflet; mildly calcified leaflets. - Mitral valve: Calcified annulus. Mildly calcified leaflets . - Left atrium: The atrium was mildly dilated. - Right ventricle: Systolic function was mildly reduced.  Lexiscan Cardiolite 05/26/2014: FINDINGS: Perfusion: There is a moderate area of mildly decreased activity involving the inferolateral aspect of the left ventricle near the base, stable on stress at rest components. No suggestion of ischemia.  Wall Motion: Global mild hypokinesis. No left ventricular dilation.  Left Ventricular Ejection Fraction: 41 %  End diastolic  volume 55 ml  End systolic volume 92 ml  IMPRESSION: 1. No reversible ischemia or infarction. Fixed inferolateral attenuation or scar.  2. Mild global hypokinesis without focal wall motion abnormality.  3. Left ventricular ejection fraction 41%  4. Intermediate-risk stress test findings*.  Assessment and Plan:  1. Multivessel CAD status post CABG with graft disease and subsequent PCI. He continues on medical therapy for angina management. No progressive symptoms.  2. Chronic diastolic heart failure. Weight is relatively stable on current dose of Demadex with as needed Zaroxolyn.  3. CKD stage III, last creatinine 2.2.  4. Hyperlipidemia with statin intolerance.  Current medicines were reviewed with the patient today.  Disposition: Follow-up in 4 months.  Signed, Satira Sark, MD, [facility] 10/08/2016 2:12 PM    [facility] [place], [place], [place] [address] Phone: [phone]; Fax: [phone]

## 2016-10-08 ENCOUNTER — Ambulatory Visit (INDEPENDENT_AMBULATORY_CARE_PROVIDER_SITE_OTHER): Payer: PPO | Admitting: Cardiology

## 2016-10-08 ENCOUNTER — Encounter: Payer: Self-pay | Admitting: Cardiology

## 2016-10-08 VITALS — BP 162/75 | HR 69 | Ht 64.0 in | Wt 273.6 lb

## 2016-10-08 DIAGNOSIS — N183 Chronic kidney disease, stage 3 unspecified: Secondary | ICD-10-CM

## 2016-10-08 DIAGNOSIS — I5032 Chronic diastolic (congestive) heart failure: Secondary | ICD-10-CM

## 2016-10-08 DIAGNOSIS — I25119 Atherosclerotic heart disease of native coronary artery with unspecified angina pectoris: Secondary | ICD-10-CM | POA: Diagnosis not present

## 2016-10-08 DIAGNOSIS — Z789 Other specified health status: Secondary | ICD-10-CM | POA: Diagnosis not present

## 2016-10-08 DIAGNOSIS — E782 Mixed hyperlipidemia: Secondary | ICD-10-CM

## 2016-10-08 NOTE — Patient Instructions (Signed)
Medication Instructions:  Your physician recommends that you continue on your current medications as directed. Please refer to the Current Medication list given to you today.  Labwork: NONE  Testing/Procedures: NONE  Follow-Up: Your physician wants you to follow-up in: 4 MONTHS WITH DR. [name] You will receive a reminder letter in the mail two months in advance. If you don't receive a letter, please call our office to schedule the follow-up appointment.  Any Other Special Instructions Will Be Listed Below (If Applicable).  If you need a refill on your cardiac medications before your next appointment, please call your pharmacy.

## 2016-10-19 DIAGNOSIS — G4733 Obstructive sleep apnea (adult) (pediatric): Secondary | ICD-10-CM | POA: Diagnosis not present

## 2016-10-19 DIAGNOSIS — R0902 Hypoxemia: Secondary | ICD-10-CM | POA: Diagnosis not present

## 2016-11-19 DIAGNOSIS — G4733 Obstructive sleep apnea (adult) (pediatric): Secondary | ICD-10-CM | POA: Diagnosis not present

## 2016-11-19 DIAGNOSIS — R0902 Hypoxemia: Secondary | ICD-10-CM | POA: Diagnosis not present

## 2016-11-21 DIAGNOSIS — M1990 Unspecified osteoarthritis, unspecified site: Secondary | ICD-10-CM | POA: Diagnosis not present

## 2016-11-21 DIAGNOSIS — F419 Anxiety disorder, unspecified: Secondary | ICD-10-CM | POA: Diagnosis not present

## 2016-11-21 DIAGNOSIS — I5032 Chronic diastolic (congestive) heart failure: Secondary | ICD-10-CM | POA: Diagnosis not present

## 2016-11-21 DIAGNOSIS — R609 Edema, unspecified: Secondary | ICD-10-CM | POA: Diagnosis not present

## 2016-11-21 DIAGNOSIS — G4733 Obstructive sleep apnea (adult) (pediatric): Secondary | ICD-10-CM | POA: Diagnosis not present

## 2016-11-21 DIAGNOSIS — I10 Essential (primary) hypertension: Secondary | ICD-10-CM | POA: Diagnosis not present

## 2016-11-21 DIAGNOSIS — E782 Mixed hyperlipidemia: Secondary | ICD-10-CM | POA: Diagnosis not present

## 2016-11-21 DIAGNOSIS — N183 Chronic kidney disease, stage 3 (moderate): Secondary | ICD-10-CM | POA: Diagnosis not present

## 2016-11-21 DIAGNOSIS — Z6841 Body Mass Index (BMI) 40.0 and over, adult: Secondary | ICD-10-CM | POA: Diagnosis not present

## 2016-11-21 DIAGNOSIS — E1165 Type 2 diabetes mellitus with hyperglycemia: Secondary | ICD-10-CM | POA: Diagnosis not present

## 2016-11-21 DIAGNOSIS — G4700 Insomnia, unspecified: Secondary | ICD-10-CM | POA: Diagnosis not present

## 2016-11-26 ENCOUNTER — Other Ambulatory Visit: Payer: Self-pay

## 2016-11-26 NOTE — Patient Outreach (Addendum)
[facility] [facility]) Care Management  11/26/2016  [name] February 13, 1934 [id number]   Telephone Screen  Referral Date: 11/26/16 Referral Source: MD office([name]) Referral Reason: "uncontrolled diabetes and prescription assistance" Insurance: HTA   Outreach attempt # 1  to patient. Spoke with patient and screening completed.   Social: Patient resides in his home along with his spouse. He voices that spouse assists him with all of his ADLs/IADLs. He is still able to drive locally short distances and takes himself to MD appts. Patient states that he was a Theme park manager for over fifty years and within the past few years he had to give up his church due to his declining health. 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 He denies any falls. DME in the home includes cbg meter, walker, cane, hospital bed, oxygen, CPAP and shower chair.   Conditions: Patient has PMH of  Anxiety, arthritis, COPD, CAD, Depression, HTN, DM, obesity, sleep apnea, GERD and HLD. Patient reports that his health has continued to decline over the past few years. He voices that he went to MD a few days and his labs were elevated. A1C noted to be 13.3(11/22/16). Patient admits he needs assistance managing and getting Diabetes under control. He is not checking cbgs in the home as he has been advised to do. Patient unable to recall the last time he checked blood sugar at home. He does have monitor but states he doesn't check.   Medications: He voices taking eight meds in the morning and three meds at night. Patient report difficulty affording insulin(Toujeo). He states that he costs him over $400 for nine pens which is not even a month supply. He voices MD recently changed his insulin from 100 units QAM to 60 units QAM & QPM. Spouse is assisting patient with managing meds.  Appointments: Patient saw PCP on 11/22/16. He is also followed by cardiologist-Dr.  Domenic [name] and sees again on 02/08/17.   Advance Directives: Patient voices he has living will and has given PCP office a copy. He desires a natural death and no aggressive measures.   Consent: [facility] services reviewed and discussed. Patient gave verbal consent for [facility] services.   Plan: RN CM will notify [facility] [place] administrative assistant of case status. RN CM will send referral to [facility] RN for further in home eval/assessment of care needs and management of chronic conditions. RN CM will send [facility] referral for polypharmacy med review and med assistance.   [name] Montgomery, RN,BSN,CCM [place] Management Telephonic Care Management Coordinator Direct Phone: [phone] Toll Free: [phone] Fax: [phone]

## 2016-11-27 ENCOUNTER — Telehealth: Payer: Self-pay | Admitting: *Deleted

## 2016-11-27 NOTE — Patient Outreach (Signed)
[place] [facility]) Care Management  11/27/2016  [name] [birth year] [id number]   [place] CM received referral on 11/26/16 from [place] telephonic RN after referral from pcp, [place] [facility] office  Telephonic RN assessed and confirmed [name] needs help managing Diabetes and needs further in home eval/assessment of care needs & management of chronic conditions. This THN CM called [name], no answer but THN CM was able to leave a HIPPA compliant voice message to include [facility] CM's mobile number for a return call    Plans:  THN CM will update assigned THN CM on THN CM interventions completed  [name] will be contacted next week   Jahmari Esbenshade L. Lavina Hamman, RN, BSN, [place] Care Management [phone]

## 2016-11-30 ENCOUNTER — Other Ambulatory Visit: Payer: Self-pay | Admitting: *Deleted

## 2016-11-30 NOTE — Patient Outreach (Signed)
Telephone call to pt to schedule initial home visit, spoke with pt, HIPAA verified, initial home visit scheduled for this week.  Jacqlyn Larsen [facility], [place] Coordinator [phone]

## 2016-12-02 ENCOUNTER — Other Ambulatory Visit: Payer: Self-pay | Admitting: *Deleted

## 2016-12-02 ENCOUNTER — Encounter: Payer: Self-pay | Admitting: *Deleted

## 2016-12-02 NOTE — Patient Outreach (Signed)
[place] [place] [facility]) Care Management   12/02/2016  [name] [birth year] [id number]  [name] is an 83 y.o. male  Subjective: Initial home visit with pt, HIPAA verified, wife present, pt states he has no record of CBG because he chooses to share a glucometer with his wife, pt does not check CBG daily or record, pt does not weigh daily or record, does not always follow his carbohydrate modified diet. Pt states wife is primary caregiver and his adult children assist.  Pt and wife still drive.  Objective:   Vitals:   12/02/16 1244  BP: 126/62  Pulse: 86  Resp: 18  SpO2: 94%  Weight: 273 lb (123.8 kg)  Height: 1.651 m (5\' 5" )  CBG 229 fasting today Pt not keeping log and shares glucometer with his wife so unable to determine readings. ROS  Physical Exam  Constitutional: He is oriented to person, place, and time. He appears well-developed and well-nourished.  HENT:  Head: Normocephalic.  Neck: Normal range of motion. Neck supple.  Cardiovascular: Normal rate.   Respiratory: Effort normal and breath sounds normal.  GI: Soft. Bowel sounds are normal.  Musculoskeletal: Normal range of motion. He exhibits edema.  2+ edema lower extremities bil  Neurological: He is alert and oriented to person, place, and time.  Skin: Skin is warm and dry.  Psychiatric: He has a normal mood and affect. His behavior is normal. Judgment and thought content normal.    Encounter Medications:   Outpatient Encounter Prescriptions as of 12/02/2016  Medication Sig Note  . allopurinol (ZYLOPRIM) 100 MG tablet Take 1 tablet (100 mg total) by mouth daily.   Marland Kitchen alprazolam (XANAX) 2 MG tablet Take 1 mg by mouth at bedtime.    Marland Kitchen aspirin EC 81 MG tablet Take 81 mg by mouth every morning.   . clopidogrel (PLAVIX) 75 MG tablet take 1 tablet by mouth once daily WITH BREAKFAST   . cyclobenzaprine (FLEXERIL) 10 MG tablet Take 10 mg by mouth at bedtime.    Marland Kitchen diltiazem (CARDIZEM CD) 240 MG 24 hr  capsule Take 240 mg by mouth daily.   . metolazone (ZAROXOLYN) 2.5 MG tablet 01/29/16 change in directions Take one day for 2-3 days as needed only for weight gain of 3-5 lbs in a 48 hour period.   . metoprolol tartrate (LOPRESSOR) 25 MG tablet Take 1 tablet (25 mg total) by mouth 2 (two) times daily.   . polyethylene glycol powder (GLYCOLAX/MIRALAX) powder Take 17 g by mouth daily as needed (constipation).  02/23/2013: Received from: External Pharmacy  . tamsulosin (FLOMAX) 0.4 MG CAPS capsule Take 0.4 mg by mouth every evening.  02/23/2013: Received from: External Pharmacy  . torsemide (DEMADEX) 20 MG tablet Take Two Tablets ( 40 mg )  Daily Alternating with One Tablet (20 mg) Daily   . TOUJEO SOLOSTAR 300 UNIT/ML SOPN inject 60 units BID 03/26/2015: Received from: External Pharmacy  . isosorbide mononitrate (IMDUR) 30 MG 24 hr tablet Take 1 tablet (30 mg total) by mouth daily. (Patient taking differently: Take 30 mg by mouth daily. 12/02/16- pt is taking as prescribed)   . nitroGLYCERIN (NITROLINGUAL) 0.4 MG/SPRAY spray use 1-2 sprays under the tongue if needed for chest pain *MAXIMUM OF 3 SPRAYS IN 15 MINUTES (Patient not taking: Reported on 12/02/2016)   . psyllium (METAMUCIL) 58.6 % powder Take by mouth every morning. 05/25/2014: Heaping tsp   No facility-administered encounter medications on file as of 12/02/2016.     Functional  Status:   In your present state of health, do you have any difficulty performing the following activities: 12/02/2016 07/20/2016  Hearing? Y N  Vision? N N  Difficulty concentrating or making decisions? N N  Walking or climbing stairs? Y N  Dressing or bathing? Y N  Doing errands, shopping? Y N  Preparing Food and eating ? Y -  Using the Toilet? N -  In the past six months, have you accidently leaked urine? N -  Do you have problems with loss of bowel control? N -  Managing your Medications? Y -  Managing your Finances? Y -  Comment wife takes care of all  financial issues -  Housekeeping or managing your Housekeeping? Y -  Some recent data might be hidden    Fall/Depression Screening:    Fall Risk  12/02/2016 11/26/2016  Falls in the past year? No No  Risk for fall due to : Impaired balance/gait;Impaired mobility;Medication side effect Impaired balance/gait;Impaired mobility;Medication side effect;Impaired vision   PHQ 2/9 Scores 12/02/2016 11/26/2016  PHQ - 2 Score 2 2  PHQ- 9 Score 6 6    Assessment:  RN CM gave pt THN calendar and ask pt to start recording weight and CBG, pt is to start doing both and take to MD appointments.  RN CM emphasized  CHF zones.  RN CM ask pt to use his own glucometer and not share with his wife.  RN CM ask pt to try to be more active, walking through the house daily with walker, chair exercises, reviewed safety precautions.RN CM addressed difficulty hearing and pt not interested in obtaining hearing aides. RN CM observed all medication bottles and reviewed with pt and wife.  RN CM faxed initial home visit and barrier letter letter to primary MD office- [facility] PA-C.  THN CM Care Plan Problem One     Most Recent Value  Care Plan Problem One  Knowledge deficit related to diabetes  Role Documenting the Problem One  Care Management Coordinator  Care Plan for Problem One  Active  THN Long Term Goal   Pt will have reduction in Hgb AIC by 2 points within 90 days  THN Long Term Goal Start Date  12/03/16  Interventions for Problem One Long Term Goal  RN CM reviewed how diabetes affects the body, reviewed importance of proper food choices and checking CBG daily and logging  THN CM Short Term Goal #1    Pt will consistently/ daily check and record CBG in [facility] calendar  [facility] CM Short Term Goal #1 Start Date  12/03/16  Interventions for Short Term Goal #1  RN CM gave pt [facility] calendar and reviewed checking CBG daily and logging , take to all MD appointments  THN CM Short Term Goal #2   Pt will verbalize healthier food  choices being mindful of carbohydrate intake within 30 days.  THN CM Short Term Goal #2 Start Date  12/03/16  Interventions for Short Term Goal #2  RN CM gave pt, wife EMMI handouts and reviewed on diabetic diet, gave Basic Carb counting poster and reviewed with pt and wife.    THN CM Care Plan Problem Two     Most Recent Value  Care Plan Problem Two  Knowledge deficit related to CHF  Role Documenting the Problem Two  Care Management Coordinator  Care Plan for Problem Two  Active  THN CM Short Term Goal #1   Pt will weigh daily and record in [facility] calendar within  30 days.  THN CM Short Term Goal #1 Start Date  12/03/16  Interventions for Short Term Goal #2   RN CM reviewed importance of daily weights and recording, pt states he will start weighing daily  THN CM Short Term Goal #2   Pt, wife will verbalize CHF action plan/ zones within 30 days.  THN CM Short Term Goal #2 Start Date  12/03/16  Interventions for Short Term Goal #2  RN CM gave pt copy of CHF zones and reviewed all zones, importance of calling MD early to avoid hospitalization      Plan: see pt for home visit next month Assess CBG and weight log  Jacqlyn Larsen [facility], [place] Coordinator [phone]

## 2016-12-09 ENCOUNTER — Other Ambulatory Visit: Payer: Self-pay | Admitting: Pharmacist

## 2016-12-09 NOTE — Patient Outreach (Signed)
[name] was referred to pharmacy for medication assistance with the cost of his insulin. Left a HIPAA compliant message on the patient's voicemail. If have not heard from patient by 12/11/16, will give him another call at that time.  Harlow Asa, PharmD, [place] Management [phone]

## 2016-12-09 NOTE — Patient Outreach (Signed)
Receive a call back from [facility] regarding medication assistance. HIPAA identifiers verified and verbal consent received.  Mr. Bonner asks that I speak with his wife regarding medication assistance options, as he reports that she handles the medications. Speak with Mrs. Advincula and discuss patient assistance options such as the extra help application through [employer] and patient assistance for [employer] through [employer] Mrs. Cubero states that they would like my assistance with completing the extra help application over the phone once she has found the paperwork that she needs. Offer to schedule a time to complete this application. However, Mrs. Essick reports that she would prefer to call me back once she has put together all of the paperwork.   If have not heard back from Mr and Mrs. Kopf by next week, will call to follow up again at that time.  Harlow Asa, PharmD, [place] Management [phone]

## 2016-12-11 ENCOUNTER — Ambulatory Visit: Payer: PPO | Admitting: Pharmacist

## 2016-12-18 ENCOUNTER — Other Ambulatory Visit: Payer: Self-pay | Admitting: Cardiology

## 2016-12-18 ENCOUNTER — Other Ambulatory Visit: Payer: Self-pay | Admitting: Pharmacist

## 2016-12-18 ENCOUNTER — Ambulatory Visit: Payer: Self-pay | Admitting: Pharmacist

## 2016-12-18 NOTE — Patient Outreach (Signed)
Receive a call back from [facility] regarding medication assistance. HIPAA identifiers verified and verbal consent received.  Mr. Arrighi again gives permission for me to speak with his wife regarding medication assistance options. Mrs. Purnell reports that she has gotten together the paperwork to complete the extra help application and asks if we can complete this application now. Start the extra help application with Mr and Mrs. Orgeron now over the phone, as requested. While completing this application, receive an error message from the Social Security website requiring that Mr and Mrs Brow call Social Security before we proceed. Provide Mrs. Kusch with the phone number to call.  PLAN  1) Mr and Mrs. Zynda to call to follow up with Social Security regarding the extra help application and then call me back.  2) If have not heard back from Mr and Mrs. Dilling by next week, will call to follow up again at that time.  Harlow Asa, PharmD, [place] Management [phone]

## 2016-12-20 DIAGNOSIS — G4733 Obstructive sleep apnea (adult) (pediatric): Secondary | ICD-10-CM | POA: Diagnosis not present

## 2016-12-20 DIAGNOSIS — R0902 Hypoxemia: Secondary | ICD-10-CM | POA: Diagnosis not present

## 2016-12-23 ENCOUNTER — Other Ambulatory Visit: Payer: Self-pay | Admitting: Pharmacist

## 2016-12-23 NOTE — Patient Outreach (Signed)
Outreach call to Mr and Mrs. Hilton regarding the extra help application. HIPAA identifiers verified and verbal consent received.   Note that last time that I spoke with Mr and Mrs. Thakur, I was helping them to complete the extra help application online and we received an error message from the Social Security website requiring that Mr and Mrs Yahr call Social Security before we proceed. Mrs. Spink states that she has not yet followed up with Social Security. Speak with Mrs. Skeen about the importance of completing this application in order for the patient to receive medication assistance. Mrs. Bowlby verbalizes understanding and states that she will call today.   Mrs. Bredeson reports that she typically fills the patient's pillbox on Thursdays. Advise that she fills the pillbox today to verify that they will have enough medication to last through the coming storm. Mrs. Etsitty states that she will check now.  PLAN  Will call to follow up with Mr and Mrs. Scannell on Friday.  Harlow Asa, PharmD, [place] Management [phone]

## 2016-12-24 ENCOUNTER — Other Ambulatory Visit: Payer: Self-pay | Admitting: *Deleted

## 2016-12-24 ENCOUNTER — Encounter: Payer: Self-pay | Admitting: *Deleted

## 2016-12-24 NOTE — Patient Outreach (Signed)
[facility]) Care Management   12/24/2016  [name] 1933/09/01 [id number]  [name] is an 83 y.o. male  Subjective: Routine home visit with pt, HIPAA verified, pt reports he has been weighing daily and recording in [facility] calendar, has only checked CBG a few times.  Pt reports appetite good, no changes in medications.  Objective:   Vitals:   12/24/16 1523  BP: 126/62  Pulse: 75  Resp: 16  SpO2: 96%  Weight: 265 lb (120.2 kg)   ROS  Physical Exam  Constitutional: He is oriented to person, place, and time. He appears well-developed and well-nourished.  HENT:  Head: Normocephalic.  Neck: Normal range of motion. Neck supple.  Cardiovascular: Normal rate.   Respiratory: Effort normal and breath sounds normal.  GI: Soft. Bowel sounds are normal.  Musculoskeletal: Normal range of motion. He exhibits edema.  2+ edema left lower extremity calf and foot 1+ edema right lower extremity calf and foot  Neurological: He is alert and oriented to person, place, and time.  Skin: Skin is warm and dry.  Psychiatric: He has a normal mood and affect. His behavior is normal. Thought content normal.    Encounter Medications:   Outpatient Encounter Prescriptions as of 12/24/2016  Medication Sig Note  . allopurinol (ZYLOPRIM) 100 MG tablet Take 1 tablet (100 mg total) by mouth daily.   Marland Kitchen alprazolam (XANAX) 2 MG tablet Take 1 mg by mouth at bedtime.    Marland Kitchen aspirin EC 81 MG tablet Take 81 mg by mouth every morning.   . clopidogrel (PLAVIX) 75 MG tablet take 1 tablet by mouth once daily WITH BREAKFAST   . cyclobenzaprine (FLEXERIL) 10 MG tablet Take 10 mg by mouth at bedtime.    Marland Kitchen diltiazem (CARDIZEM CD) 240 MG 24 hr capsule Take 240 mg by mouth daily.   . metolazone (ZAROXOLYN) 2.5 MG tablet 01/29/16 change in directions Take one day for 2-3 days as needed only for weight gain of 3-5 lbs in a 48 hour period.   . metoprolol tartrate (LOPRESSOR) 25 MG tablet Take 1 tablet  (25 mg total) by mouth 2 (two) times daily.   . polyethylene glycol powder (GLYCOLAX/MIRALAX) powder Take 17 g by mouth daily as needed (constipation).  02/23/2013: Received from: External Pharmacy  . psyllium (METAMUCIL) 58.6 % powder Take by mouth every morning. 05/25/2014: Heaping tsp  . tamsulosin (FLOMAX) 0.4 MG CAPS capsule Take 0.4 mg by mouth every evening.  02/23/2013: Received from: External Pharmacy  . torsemide (DEMADEX) 20 MG tablet Take Two Tablets ( 40 mg )  Daily Alternating with One Tablet (20 mg) Daily   . TOUJEO SOLOSTAR 300 UNIT/ML SOPN inject 60 units BID 03/26/2015: Received from: External Pharmacy  . isosorbide mononitrate (IMDUR) 30 MG 24 hr tablet Take 1 tablet (30 mg total) by mouth daily. (Patient taking differently: Take 30 mg by mouth daily. 12/02/16- pt is taking as prescribed)   . nitroGLYCERIN (NITROLINGUAL) 0.4 MG/SPRAY spray use 1-2 sprays under the tongue if needed for chest pain *MAXIMUM OF 3 SPRAYS IN 15 MINUTES (Patient not taking: Reported on 12/02/2016)    No facility-administered encounter medications on file as of 12/24/2016.     Functional Status:   In your present state of health, do you have any difficulty performing the following activities: 12/02/2016 07/20/2016  Hearing? Y N  Vision? N N  Difficulty concentrating or making decisions? N N  Walking or climbing stairs? Y N  Dressing or bathing?  Y N  Doing errands, shopping? Y N  Preparing Food and eating ? Y -  Using the Toilet? N -  In the past six months, have you accidently leaked urine? N -  Do you have problems with loss of bowel control? N -  Managing your Medications? Y -  Managing your Finances? Y -  Comment wife takes care of all financial issues -  Housekeeping or managing your Housekeeping? Y -  Some recent data might be hidden    Fall/Depression Screening:    Fall Risk  12/02/2016 11/26/2016  Falls in the past year? No No  Risk for fall due to : Impaired balance/gait;Impaired  mobility;Medication side effect Impaired balance/gait;Impaired mobility;Medication side effect;Impaired vision   PHQ 2/9 Scores 12/02/2016 11/26/2016  PHQ - 2 Score 2 2  PHQ- 9 Score 6 6    Assessment:  RN CM reiterated importance of checking CBG daily and recording in [facility] calendar, pt states he still wants to work towards this goal.  RN CM praised and encouraged pt for weighing daily and recording. Patient's family continues to assist pt as needed.  THN CM Care Plan Problem One     Most Recent Value  Care Plan Problem One  Knowledge deficit related to diabetes  Role Documenting the Problem One  Care Management Coordinator  Care Plan for Problem One  Active  THN Long Term Goal   Pt will have reduction in Hgb AIC by 2 points within 90 days  THN Long Term Goal Start Date  12/03/16  Interventions for Problem One Long Term Goal  RN CM reinforced how diabetes affects the body, reviewed importance of proper food choices and checking CBG daily and logging  THN CM Short Term Goal #1    Pt will consistently/ daily check and record CBG in [facility] calendar  [facility] CM Short Term Goal #1 Start Date  12/24/16  Interventions for Short Term Goal #1  RN CM reviewed THN calendar and pt has recorded a few CBG readings, pt is not recording daily  THN CM Short Term Goal #2   Pt will verbalize healthier food choices being mindful of carbohydrate intake within 30 days.  THN CM Short Term Goal #2 Start Date  12/24/16 [goal restarted]  Interventions for Short Term Goal #2  RN CM reviewed diabetic diet and food choices, plate method    THN CM Care Plan Problem Two     Most Recent Value  Care Plan Problem Two  Knowledge deficit related to CHF  Role Documenting the Problem Two  Care Management [place] for Problem Two  Active  THN CM Short Term Goal #1   Pt will weigh daily and record in [facility] calendar within 30 days.  THN CM Short Term Goal #1 Start Date  12/03/16  [facility] CM Short Term Goal #1 Met Date    12/24/16  Interventions for Short Term Goal #2   RN CM reviewed weights in [facility] calendar, pt is recording daily  THN CM Short Term Goal #2   Pt, wife will verbalize CHF action plan/ zones within 30 days.  THN CM Short Term Goal #2 Start Date  12/24/16 Barrie Folk restarted]  Interventions for Short Term Goal #2  RN CM reinforced CHF zones/ action plan, symptom managmement      PLAN See pt for home visit next month  Jacqlyn Larsen [facility], [place] Coordinator [phone]

## 2016-12-25 ENCOUNTER — Ambulatory Visit: Payer: Self-pay | Admitting: Pharmacist

## 2016-12-28 ENCOUNTER — Ambulatory Visit: Payer: Self-pay | Admitting: Pharmacist

## 2016-12-28 ENCOUNTER — Other Ambulatory Visit: Payer: Self-pay | Admitting: Pharmacist

## 2016-12-28 NOTE — Patient Outreach (Signed)
Receive a voicemail from patient's wife stating that she called Social Security about the Extra Help application and requested that they receive a paper copy in the mail. Mrs. Zeien reports that she will call me back as soon as she receives this paper copy of the application in the mail.  If have not heard back from Mrs. Doubek by 12/30/16, will follow up again at this time.  Harlow Asa, PharmD, [place] Management [phone]

## 2017-01-01 ENCOUNTER — Other Ambulatory Visit: Payer: Self-pay | Admitting: Pharmacist

## 2017-01-01 NOTE — Patient Outreach (Signed)
Call to follow up with Mr and Mrs. Biven about the extra help application. Mrs. Deupree reports that they have requested a papercopy be mailed and that she will follow up with me as soon as they receive this in the mail.   Denies any further medication questions/concerns at this time.  If I have not heard back from Mr and Mrs. Brazzel by next week, will follow up again at that time.  Harlow Asa, PharmD, [place] Management [phone]

## 2017-01-08 ENCOUNTER — Other Ambulatory Visit: Payer: Self-pay | Admitting: Pharmacist

## 2017-01-08 ENCOUNTER — Ambulatory Visit: Payer: Self-pay | Admitting: Pharmacist

## 2017-01-08 NOTE — Patient Outreach (Signed)
Receive a voicemail from Mrs. Gailey requesting a call back. Call to follow up with Mr and Mrs. Pflieger about the extra help application. HIPAA identifiers verified and verbal consent received.  Mrs. Zwart (on consent) reports that they have now received the papercopy of the application. Briefly review with Mrs. Boxley the components of the extra help application and the application process. She denies any further questions/concerns at this time. Remind her that she can call me with any questions as she is completing the application.   If I have not heard back from Mr and Mrs. Guse in 1 month will follow up again at that time.  Harlow Asa, PharmD, [place] Management [phone]

## 2017-01-19 DIAGNOSIS — G4733 Obstructive sleep apnea (adult) (pediatric): Secondary | ICD-10-CM | POA: Diagnosis not present

## 2017-01-19 DIAGNOSIS — R0902 Hypoxemia: Secondary | ICD-10-CM | POA: Diagnosis not present

## 2017-01-21 ENCOUNTER — Encounter: Payer: Self-pay | Admitting: *Deleted

## 2017-01-21 ENCOUNTER — Other Ambulatory Visit: Payer: Self-pay | Admitting: *Deleted

## 2017-01-21 ENCOUNTER — Telehealth: Payer: Self-pay

## 2017-01-21 NOTE — Telephone Encounter (Signed)
Patients weight was 273 lbs on October 8th. Jacqlyn Larsen RN stated that patient had a lot of edema in face, legs did not have much swelling and lungs sounded clear. RN advised patient to take the metolazone as directed for 3 days. Patient states he cannot start medication until Saturday due to having to drive. Nurse just wanted to be sure Dr. [name] did not have any further recommendations for patient.

## 2017-01-21 NOTE — Telephone Encounter (Signed)
Patient notified

## 2017-01-21 NOTE — Telephone Encounter (Signed)
Agree with the metolazone use. This has been effective for him.

## 2017-01-21 NOTE — Patient Outreach (Signed)
[facility]) Care Management   01/21/2017  [name] Apr 06, 1934 [id number]  [name] is an 83 y.o. male  Subjective: Routine home visit with pt, HIPAA verified, wife present, pt reports " my back has really been hurting and I haven't been able to weigh since October 8th and that day I weighed 273 pounds so gained 8 pounds". Patient's wife reports pt has not been taking the extra metolazone as prescribed by MD, pt states he does not like to take the metolazone because of increased urination.  Pt states they did not call MD for any change in symptoms.  Objective: Vitals:   01/21/17 1227  BP: 124/60  Pulse: 72  Resp: 16  SpO2: 96%   CBG ranges 75-244   Pt not recording daily ROS  Physical Exam  Constitutional: He is oriented to person, place, and time. He appears well-developed and well-nourished.  HENT:  Head: Normocephalic.  Neck: Normal range of motion. Neck supple.  Cardiovascular: Normal rate.   Respiratory: Effort normal and breath sounds normal.  Dyspnea with exertion  GI: Soft. Bowel sounds are normal.  Musculoskeletal: He exhibits edema.  Dependent edema lower extremities bil Facial edema under eyes noted  Neurological: He is alert and oriented to person, place, and time.  Skin: Skin is warm and dry.  Psychiatric: He has a normal mood and affect. His behavior is normal. Thought content normal.    Encounter Medications:   Outpatient Encounter Prescriptions as of 01/21/2017  Medication Sig Note  . allopurinol (ZYLOPRIM) 100 MG tablet Take 1 tablet (100 mg total) by mouth daily.   Marland Kitchen alprazolam (XANAX) 2 MG tablet Take 1 mg by mouth at bedtime.    Marland Kitchen aspirin EC 81 MG tablet Take 81 mg by mouth every morning.   . clopidogrel (PLAVIX) 75 MG tablet take 1 tablet by mouth once daily WITH BREAKFAST   . cyclobenzaprine (FLEXERIL) 10 MG tablet Take 10 mg by mouth at bedtime.    Marland Kitchen diltiazem (CARDIZEM CD) 240 MG 24 hr capsule Take 240 mg by mouth  daily.   . metolazone (ZAROXOLYN) 2.5 MG tablet 01/29/16 change in directions Take one day for 2-3 days as needed only for weight gain of 3-5 lbs in a 48 hour period.   . metoprolol tartrate (LOPRESSOR) 25 MG tablet Take 1 tablet (25 mg total) by mouth 2 (two) times daily.   . polyethylene glycol powder (GLYCOLAX/MIRALAX) powder Take 17 g by mouth daily as needed (constipation).  02/23/2013: Received from: External Pharmacy  . psyllium (METAMUCIL) 58.6 % powder Take by mouth every morning. 05/25/2014: Heaping tsp  . tamsulosin (FLOMAX) 0.4 MG CAPS capsule Take 0.4 mg by mouth every evening.  02/23/2013: Received from: External Pharmacy  . torsemide (DEMADEX) 20 MG tablet Take Two Tablets ( 40 mg )  Daily Alternating with One Tablet (20 mg) Daily   . TOUJEO SOLOSTAR 300 UNIT/ML SOPN inject 60 units BID 03/26/2015: Received from: External Pharmacy  . isosorbide mononitrate (IMDUR) 30 MG 24 hr tablet Take 1 tablet (30 mg total) by mouth daily. (Patient taking differently: Take 30 mg by mouth daily. 12/02/16- pt is taking as prescribed)   . nitroGLYCERIN (NITROLINGUAL) 0.4 MG/SPRAY spray use 1-2 sprays under the tongue if needed for chest pain *MAXIMUM OF 3 SPRAYS IN 15 MINUTES (Patient not taking: Reported on 12/02/2016)    No facility-administered encounter medications on file as of 01/21/2017.     Functional Status:   In your present  state of health, do you have any difficulty performing the following activities: 12/02/2016 07/20/2016  Hearing? Y N  Vision? N N  Difficulty concentrating or making decisions? N N  Walking or climbing stairs? Y N  Dressing or bathing? Y N  Doing errands, shopping? Y N  Preparing Food and eating ? Y -  Using the Toilet? N -  In the past six months, have you accidently leaked urine? N -  Do you have problems with loss of bowel control? N -  Managing your Medications? Y -  Managing your Finances? Y -  Comment wife takes care of all financial issues -  Housekeeping  or managing your Housekeeping? Y -  Some recent data might be hidden    Fall/Depression Screening:    Fall Risk  01/21/2017 12/02/2016 11/26/2016  Falls in the past year? No No No  Risk for fall due to : Impaired balance/gait;Impaired mobility;Medication side effect Impaired balance/gait;Impaired mobility;Medication side effect Impaired balance/gait;Impaired mobility;Medication side effect;Impaired vision   PHQ 2/9 Scores 12/02/2016 11/26/2016  PHQ - 2 Score 2 2  PHQ- 9 Score 6 6    Assessment:  Pt not adhering to CHF action plan, has not called MD for weight gain and facial edema, has not been taking metolazone.  RN CM talked with patient's wife about pt taking medications as prescribed and calling MD to report any changes such as weight gain.  RN CM reviewed medications with pt and wife.  THN CM Care Plan Problem One     Most Recent Value  Care Plan Problem One  Knowledge deficit related to diabetes  Role Documenting the Problem One  Care Management [place] for Problem One  Active  THN Long Term Goal   Pt will have reduction in Hgb AIC by 2 points within 90 days  THN Long Term Goal Start Date  12/03/16  Interventions for Problem One Long Term Goal  RN CM reviewed importance of keeping detailed CBG log in [facility] calendar, importance of following carbohydrate modified diet and plate method  THN CM Short Term Goal #1    Pt will consistently/ daily check and record CBG in [facility] calendar  [facility] CM Short Term Goal #1 Start Date  01/21/17 [goal restarted]  Interventions for Short Term Goal #1  RN CM reviewed [facility] calendar and pt has recorded a few CBG readings, pt is not recording daily, RN CM urged pt to log readings daily, wife states "we will try to do better"  THN CM Short Term Goal #2   Pt will verbalize healthier food choices being mindful of carbohydrate intake within 30 days.  THN CM Short Term Goal #2 Start Date  12/24/16 [goal restarted]  THN CM Short Term Goal #2 Met Date   01/21/17  Interventions for Short Term Goal #2  RN CM reinforced diabetic diet and food choices, plate method    THN CM Care Plan Problem Two     Most Recent Value  Care Plan Problem Two  Knowledge deficit related to CHF  Role Documenting the Problem Two  Care Management [place] for Problem Two  Active  THN CM Short Term Goal #1   Pt will weigh daily and record in [facility] calendar within 30 days.  THN CM Short Term Goal #1 Start Date  12/03/16  [facility] CM Short Term Goal #1 Met Date   12/24/16  THN CM Short Term Goal #2   Pt, wife will verbalize CHF action  plan/ zones within 30 days.  THN CM Short Term Goal #2 Start Date  01/21/17 [goal restarted]  Interventions for Short Term Goal #2  RN CM reviewed CHF zones/ action plan, symptom managmement, telephone call to Dr. Myles Gip office , spoke with Gearlean Alf and reported patient's weight on 10/8 273 pounds and pt has not been able to weigh since due to back pain, weight last month 265 pounds, pt has facial edema, has not been taking metolazone but plans to start tomorrow, Gearlean Alf will let MD know and call pt if any changes made.      Plan: follow up by telephone tomorrow Ask if any new instructions/ orders by MD Ask if pt taking metolazone See pt for home visit in 3 weeks  Jacqlyn Larsen [facility], [place] Coordinator [phone]

## 2017-01-22 ENCOUNTER — Other Ambulatory Visit: Payer: Self-pay | Admitting: *Deleted

## 2017-01-22 NOTE — Patient Outreach (Signed)
Telephone call to pt for follow up from yesterday's visit, spoke with wife, HIPAA verified, patient's wife reports "he did take metolazone this morning and will take it this weekend"  Pt did not weigh today due to he is not able, wife reports " fluid about the same, some in area of his face"  No changes made by cardiologist office, pt will continue taking medications/ diuretics as prescribed by MD.  RN CM reviewed CHF action plan and reminded wife to call MD for any changes in health status/ symptoms.  PLAN Call pt on Monday 01/25/17 for follow up Ask if pt was able to weigh, follow up on edema, follow up if pt is taking medications as prescribed  Jacqlyn Larsen [facility], [place] Coordinator [phone]

## 2017-01-25 ENCOUNTER — Other Ambulatory Visit: Payer: Self-pay | Admitting: *Deleted

## 2017-01-25 NOTE — Patient Outreach (Signed)
Telephone call to patient for follow up on weight, edema, no answer to telephone, left voicemail requesting return phone call.    PLAN Outreach pt tomorrow  Jacqlyn Larsen [facility], [place] Coordinator [phone]

## 2017-01-26 ENCOUNTER — Other Ambulatory Visit: Payer: Self-pay | Admitting: *Deleted

## 2017-01-26 NOTE — Patient Outreach (Signed)
RN CM received voicemail from patient's wife stating "He's doing better, weight down to 270 pounds and he's taking his medicine". RN CM called pt to follow up today and no answer to telephone, left voicemail requesting return phone call.  Jacqlyn Larsen [facility], [place] Coordinator [phone]

## 2017-02-01 ENCOUNTER — Other Ambulatory Visit: Payer: Self-pay | Admitting: *Deleted

## 2017-02-01 NOTE — Patient Outreach (Signed)
Telephone call to pt for follow up on weight, edema, no answer to telephone, left voicemail requesting return phone call.  PLAN See pt for home visit next week  Jacqlyn Larsen [facility], [place] Coordinator [phone]

## 2017-02-03 NOTE — Progress Notes (Deleted)
Cardiology Office Note  Date: 02/03/2017   ID: [name], DOB [birth year], MRN [id number]  PCP: [name], MD  Primary Cardiologist: [name], MD   No chief complaint on file.   History of Present Illness: [name] is an 83 y.o. male last seen in June.  Past Medical History:  Diagnosis Date  . Allergic rhinitis   . Anxiety   . Arthritis   . COPD (chronic obstructive pulmonary disease) ([place])   . Coronary atherosclerosis of native coronary artery    a. CABG x 4 in 1994. Previous stent placement to SVG to distal RCA;  b. DES to native Cx in 2006;  c. DES SVG to RCA 05/08/11; d. Inf MI/Cath/PCI: LM 25, LAD 100, LCX patent stent, 50 into OM, RI 60-70, RCA 100, LIMA->LAD nl, VG->Diag 100 old, VG->RCA/PL 99 (DES x 1), EF 50-55%.  . Depression    With anxious features/hx panic attacks  . Essential hypertension, benign   . GERD (gastroesophageal reflux disease)   . Hyperlipidemia    Not on statin 2/2 hx of intolerance.   . Morbid obesity ([place])   . Nephrolithiasis   . Pneumonia   . Sleep apnea   . ST elevation myocardial infarction (STEMI) of inferior wall ([place])    4/14 - DES SVG to PDA  . Type 2 diabetes mellitus ([place])     Past Surgical History:  Procedure Laterality Date  . CORONARY ARTERY BYPASS GRAFT  1994  . EYE SURGERY    . LEFT HEART CATHETERIZATION WITH CORONARY ANGIOGRAM N/A 05/08/2011   Procedure: LEFT HEART CATHETERIZATION WITH CORONARY ANGIOGRAM;  Surgeon: [name], MD;  [name] C[facility] LAB;  Service: Cardiovascular;  Laterality: N/A;  . LEFT HEART CATHETERIZATION WITH CORONARY ANGIOGRAM N/A 08/08/2012   Procedure: LEFT HEART CATHETERIZATION WITH CORONARY ANGIOGRAM;  Surgeon: [name], MD;  Location[facility] CATH LAB;  Service: Cardiovascular;  Laterality: N/A;  . PERCUTANEOUS CORONARY STENT INTERVENTION (PCI-S) Right 05/08/2011   Procedure: PERCUTANEOUS CORONARY STENT INTERVENTION (PCI-S);  Surgeon: [name], MD;   Location: [facility] CATH LAB;  Service: Cardiovascular;  Laterality: Right;  . PERCUTANEOUS CORONARY STENT INTERVENTION (PCI-S) N/A 08/08/2012   Procedure: PERCUTANEOUS CORONARY STENT INTERVENTION (PCI-S);  Surgeon: [name], MD;  Location: [facility] CATH LAB;  Service: Cardiovascular;  Laterality: N/A;    Current Outpatient Prescriptions  Medication Sig Dispense Refill  . allopurinol (ZYLOPRIM) 100 MG tablet Take 1 tablet (100 mg total) by mouth daily. 30 tablet 2  . alprazolam (XANAX) 2 MG tablet Take 1 mg by mouth at bedtime.     Marland Kitchen aspirin EC 81 MG tablet Take 81 mg by mouth every morning.    . clopidogrel (PLAVIX) 75 MG tablet take 1 tablet by mouth once daily WITH BREAKFAST 90 tablet 1  . cyclobenzaprine (FLEXERIL) 10 MG tablet Take 10 mg by mouth at bedtime.     Marland Kitchen diltiazem (CARDIZEM CD) 240 MG 24 hr capsule Take 240 mg by mouth daily.    . isosorbide mononitrate (IMDUR) 30 MG 24 hr tablet Take 1 tablet (30 mg total) by mouth daily. (Patient taking differently: Take 30 mg by mouth daily. 12/02/16- pt is taking as prescribed) 90 tablet 3  . metolazone (ZAROXOLYN) 2.5 MG tablet 01/29/16 change in directions Take one day for 2-3 days as needed only for weight gain of 3-5 lbs in a 48 hour period. 15 tablet 1  . metoprolol tartrate (LOPRESSOR) 25 MG tablet Take  1 tablet (25 mg total) by mouth 2 (two) times daily. 180 tablet 3  . nitroGLYCERIN (NITROLINGUAL) 0.4 MG/SPRAY spray use 1-2 sprays under the tongue if needed for chest pain *MAXIMUM OF 3 SPRAYS IN 15 MINUTES (Patient not taking: Reported on 12/02/2016) 25 g 3  . polyethylene glycol powder (GLYCOLAX/MIRALAX) powder Take 17 g by mouth daily as needed (constipation).     . psyllium (METAMUCIL) 58.6 % powder Take by mouth every morning.    . tamsulosin (FLOMAX) 0.4 MG CAPS capsule Take 0.4 mg by mouth every evening.     . torsemide (DEMADEX) 20 MG tablet Take Two Tablets ( 40 mg )  Daily Alternating with One Tablet (20 mg) Daily 45 tablet 6    . TOUJEO SOLOSTAR 300 UNIT/ML SOPN inject 60 units BID  0   No current facility-administered medications for this visit.    Allergies:  Zocor [simvastatin] and Lipitor [atorvastatin]   Social History: The patient  reports that he has never smoked. He has never used smokeless tobacco. He reports that he does not drink alcohol or use drugs.   Family History: The patient's family history includes Heart attack in his mother.   ROS:  Please see the history of present illness. Otherwise, complete review of systems is positive for {NONE DEFAULTED:18576::"none"}.  All other systems are reviewed and negative.   Physical Exam: VS:  There were no vitals taken for this visit., BMI There is no height or weight on file to calculate BMI.  Wt Readings from Last 3 Encounters:  12/24/16 265 lb (120.2 kg)  12/02/16 273 lb (123.8 kg)  10/08/16 273 lb 9.6 oz (124.1 kg)    General: Patient appears comfortable at rest. HEENT: Conjunctiva and lids normal, oropharynx clear with moist mucosa. Neck: Supple, no elevated JVP or carotid bruits, no thyromegaly. Lungs: Clear to auscultation, nonlabored breathing at rest. Cardiac: Regular rate and rhythm, no S3 or significant systolic murmur, no pericardial rub. Abdomen: Soft, nontender, no hepatomegaly, bowel sounds present, no guarding or rebound. Extremities: No pitting edema, distal pulses 2+. Skin: Warm and dry. Musculoskeletal: No kyphosis. Neuropsychiatric: Alert and oriented x3, affect grossly appropriate.  ECG: I personally reviewed the tracing from 07/23/2016 which showed sinus rhythm with PACs, old anterolateral infarct pattern.  Recent Labwork: 07/19/2016: ALT 15; AST 7; B Natriuretic Peptide 227.0 07/20/2016: Hemoglobin 13.1; Platelets 157 08/10/2016: BUN 51; Creatinine, Ser 2.21; Potassium 4.3; Sodium 138     Component Value Date/Time   CHOL 185 05/25/2014 0549   TRIG 92 05/25/2014 0549   HDL 44 05/25/2014 0549   CHOLHDL 4.2 05/25/2014 0549    VLDL 18 05/25/2014 0549   LDLCALC 123 (H) 05/25/2014 0549    Other Studies Reviewed Today:  Echocardiogram 07/20/2016: Study Conclusions  - Procedure narrative: Transthoracic echocardiography. Image   quality was fair. Contrast enhancement utilized. - Left ventricle: The cavity size was normal. There was moderate   concentric hypertrophy. Systolic function was normal. The   estimated ejection fraction was in the range of 50% to 55%.   Doppler parameters are consistent with abnormal left ventricular   relaxation (grade 1 diastolic dysfunction). Doppler parameters   are consistent with high ventricular filling pressure. - Regional wall motion abnormality: Hypokinesis of the mid   anterior, mid anteroseptal, and mid inferoseptal myocardium. - Aortic valve: Trileaflet; mildly calcified leaflets. - Mitral valve: Calcified annulus. Mildly calcified leaflets . - Left atrium: The atrium was mildly dilated. - Right ventricle: Systolic function was mildly reduced.  Assessment and Plan:    Current medicines were reviewed with the patient today.  No orders of the defined types were placed in this encounter.   Disposition:  Signed, Satira Sark, MD, [facility] 02/03/2017 9:19 AM    [facility], [place], [place] [address] Phone: [phone]; Fax: [phone]

## 2017-02-08 ENCOUNTER — Ambulatory Visit: Payer: PPO | Admitting: Cardiology

## 2017-02-08 ENCOUNTER — Encounter (HOSPITAL_COMMUNITY): Payer: Self-pay | Admitting: Emergency Medicine

## 2017-02-08 ENCOUNTER — Emergency Department (HOSPITAL_COMMUNITY): Payer: No Typology Code available for payment source

## 2017-02-08 ENCOUNTER — Emergency Department (HOSPITAL_COMMUNITY)
Admission: EM | Admit: 2017-02-08 | Discharge: 2017-02-08 | Disposition: A | Discharge status: Home / Self Care | Payer: No Typology Code available for payment source | Attending: Emergency Medicine | Admitting: Emergency Medicine

## 2017-02-08 DIAGNOSIS — Z7982 Long term (current) use of aspirin: Secondary | ICD-10-CM | POA: Insufficient documentation

## 2017-02-08 DIAGNOSIS — I5032 Chronic diastolic (congestive) heart failure: Secondary | ICD-10-CM | POA: Insufficient documentation

## 2017-02-08 DIAGNOSIS — Z955 Presence of coronary angioplasty implant and graft: Secondary | ICD-10-CM | POA: Diagnosis not present

## 2017-02-08 DIAGNOSIS — Z794 Long term (current) use of insulin: Secondary | ICD-10-CM | POA: Diagnosis not present

## 2017-02-08 DIAGNOSIS — E119 Type 2 diabetes mellitus without complications: Secondary | ICD-10-CM | POA: Insufficient documentation

## 2017-02-08 DIAGNOSIS — M542 Cervicalgia: Secondary | ICD-10-CM | POA: Insufficient documentation

## 2017-02-08 DIAGNOSIS — Y9289 Other specified places as the place of occurrence of the external cause: Secondary | ICD-10-CM | POA: Diagnosis not present

## 2017-02-08 DIAGNOSIS — I259 Chronic ischemic heart disease, unspecified: Secondary | ICD-10-CM | POA: Diagnosis not present

## 2017-02-08 DIAGNOSIS — I130 Hypertensive heart and chronic kidney disease with heart failure and stage 1 through stage 4 chronic kidney disease, or unspecified chronic kidney disease: Secondary | ICD-10-CM | POA: Diagnosis not present

## 2017-02-08 DIAGNOSIS — N183 Chronic kidney disease, stage 3 (moderate): Secondary | ICD-10-CM | POA: Diagnosis not present

## 2017-02-08 DIAGNOSIS — Z7902 Long term (current) use of antithrombotics/antiplatelets: Secondary | ICD-10-CM | POA: Insufficient documentation

## 2017-02-08 DIAGNOSIS — Y999 Unspecified external cause status: Secondary | ICD-10-CM | POA: Insufficient documentation

## 2017-02-08 DIAGNOSIS — Y9389 Activity, other specified: Secondary | ICD-10-CM | POA: Insufficient documentation

## 2017-02-08 DIAGNOSIS — S199XXA Unspecified injury of neck, initial encounter: Secondary | ICD-10-CM | POA: Diagnosis not present

## 2017-02-08 DIAGNOSIS — S0990XA Unspecified injury of head, initial encounter: Secondary | ICD-10-CM | POA: Diagnosis not present
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
## 2017-02-08 NOTE — Discharge Instructions (Signed)
Follow up with your family md next week for recheck.  He can follow up about your tiny lesion seen on the brain mri

## 2017-02-11 ENCOUNTER — Encounter: Payer: Self-pay | Admitting: *Deleted

## 2017-02-11 ENCOUNTER — Telehealth: Payer: Self-pay

## 2017-02-11 ENCOUNTER — Other Ambulatory Visit: Payer: Self-pay | Admitting: *Deleted
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
## 2017-02-11 NOTE — Telephone Encounter (Signed)
[name] Larsen, RN contacted office stating she was in patients home assessing vitals and patients HR was 114 at rest. Patient was not having any symptoms and states he has felt great today. Nurse stated she would have patient check HR over the next couple of days and if it remains elevated she would have patient contact our office. Patient will contact office to reschedule missed appointment as well.

## 2017-02-12 ENCOUNTER — Other Ambulatory Visit: Payer: Self-pay | Admitting: Pharmacist

## 2017-02-12 NOTE — Patient Outreach (Addendum)
Receive a message from Mrs. Tangen letting me know that she and Mr. Bartell received a response back from [employer] indicating that they were denied for Extra Help. Call to follow up with Mr and Mrs. Groll about the patient assistance program for [employer]. Left a HIPAA compliant message on the patient's voicemail. If have not heard from patient by next week, will give him another call at that time.  Harlow Asa, PharmD, [place] [facility] [phone]

## 2017-02-16 ENCOUNTER — Telehealth: Payer: Self-pay | Admitting: *Deleted

## 2017-02-16 ENCOUNTER — Other Ambulatory Visit: Payer: Self-pay | Admitting: *Deleted
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
## 2017-02-16 NOTE — Patient Outreach (Signed)
Telephone call to patient for follow up on heart rate, spoke with wife, HIPAA verified, wife reports "it's been kinda high"  On 02/12/17-113                           02/13/17-117                           02/14/17-115                           02/15/17-105 Wife states " I was gonna call heart doctor today"  RN CM will call cardiologist and report,  Telephone call to Dr. Myles Gip office, spoke with Edd Fabian , reported heart rate log and pt otherwise  asymptomatic. Edd Fabian states MD will most likely want to see pt in office and they will call pt with instructions.  Jacqlyn Larsen [facility], [place] Coordinator [phone]

## 2017-02-16 NOTE — Telephone Encounter (Signed)
It might be simpler if he can get by the [facility] office to get an ECG done. Mainly need to make sure he is not in atrial fibrillation. Otherwise, I can see him on Thursday as you indicated.

## 2017-02-16 NOTE — Telephone Encounter (Signed)
Wife Thereasa Parkin) notified.  Stated that she has been able to work out transportation for Thursday.  OV scheduled for 02/18/2017 at 1:00 in [place] office.
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
## 2017-02-18 ENCOUNTER — Ambulatory Visit: Payer: PPO | Admitting: Cardiology

## 2017-02-18 ENCOUNTER — Other Ambulatory Visit (HOSPITAL_COMMUNITY)
Admission: RE | Admit: 2017-02-18 | Discharge: 2017-02-18 | Disposition: A | Discharge status: Home / Self Care | Payer: No Typology Code available for payment source | Source: Ambulatory Visit | Attending: Cardiology | Admitting: Cardiology

## 2017-02-18 ENCOUNTER — Encounter: Payer: Self-pay | Admitting: Cardiology

## 2017-02-18 VITALS — BP 108/58 | HR 62 | Ht 65.0 in | Wt 269.0 lb

## 2017-02-18 DIAGNOSIS — I5032 Chronic diastolic (congestive) heart failure: Secondary | ICD-10-CM | POA: Diagnosis not present

## 2017-02-18 DIAGNOSIS — I25119 Atherosclerotic heart disease of native coronary artery with unspecified angina pectoris: Secondary | ICD-10-CM

## 2017-02-18 DIAGNOSIS — N183 Chronic kidney disease, stage 3 unspecified: Secondary | ICD-10-CM

## 2017-02-18 DIAGNOSIS — R000 Tachycardia, unspecified: Secondary | ICD-10-CM | POA: Diagnosis not present

## 2017-02-18 DIAGNOSIS — N289 Disorder of kidney and ureter, unspecified: Secondary | ICD-10-CM | POA: Insufficient documentation

## 2017-02-18 LAB — BASIC METABOLIC PANEL
ANION GAP: 11 (ref 5–15)
BUN: 65 mg/dL — ABNORMAL HIGH (ref 6–20)
CO2: 32 mmol/L (ref 22–32)
Calcium: 9.8 mg/dL (ref 8.9–10.3)
Chloride: 93 mmol/L — ABNORMAL LOW (ref 101–111)
Creatinine, Ser: 2.58 mg/dL — ABNORMAL HIGH (ref 0.61–1.24)
GFR calc Af Amer: 25 mL/min — ABNORMAL LOW (ref >60)
GFR, EST NON AFRICAN AMERICAN: 22 mL/min — AB (ref >60)
Glucose, Bld: 139 mg/dL — ABNORMAL HIGH (ref 65–99)
POTASSIUM: 4 mmol/L (ref 3.5–5.1)
SODIUM: 136 mmol/L (ref 135–145)

## 2017-02-18 NOTE — Patient Instructions (Addendum)
Your physician recommends that you schedule a follow-up appointment in:  6 weeks in the [facility] with Dr.McDowell     Please get lab work done: [employer] physician has recommended that you wear an event monitor for 7 days. Event monitors are medical devices that record the heart's electrical activity. Doctors most often [place] these monitors to diagnose arrhythmias. Arrhythmias are problems with the speed or rhythm of the heartbeat. The monitor is a small, portable device. You can wear one while you do your normal daily activities. This is usually used to diagnose what is causing palpitations/syncope (passing out).    Your physician recommends that you continue on your current medications as directed. Please refer to the Current Medication list given to you today.     If you need a refill on your cardiac medications before your next appointment, please call your pharmacy.         Thank you for choosing [place] !

## 2017-02-19 ENCOUNTER — Other Ambulatory Visit: Payer: Self-pay | Admitting: *Deleted

## 2017-02-19 DIAGNOSIS — R0902 Hypoxemia: Secondary | ICD-10-CM | POA: Diagnosis not present

## 2017-02-19 DIAGNOSIS — G4733 Obstructive sleep apnea (adult) (pediatric): Secondary | ICD-10-CM | POA: Diagnosis not present

## 2017-02-19 NOTE — Patient Outreach (Signed)
Telephone call to patient for follow up, spoke with patient's wife who reports pt saw cardiologist yesterday and heart rate was in the 60's, wife states "it's been over 100 almost everyday with me checking it"  Reports " they've ordered a heart monitor that he'll be wearing"  Wife states pt not instructed to continue monitoring heart rate daily but " we can still do that".  RN CM ask patient's wife to call RN CM for any questions or concerns.     THN CM Care Plan Problem One     Most Recent Value  THN CM Short Term Goal #1 Start Date  -- [goal restarted- needs reinforcement]  THN CM Short Term Goal #2 Start Date  -- [goal restarted]    [facility] CM Care Plan Problem Two     Most Recent Value  Care Plan Problem Two  Knowledge deficit related to CHF  Role Documenting the Problem Two  Care Management Coordinator  Care Plan for Problem Two  Active  THN CM Short Term Goal #2   Pt, wife will verbalize CHF action plan/ zones within 30 days.  THN CM Short Term Goal #2 Start Date  01/21/17 [goal restarted]  THN CM Short Term Goal #2 Met Date  02/11/17  THN CM Short Term Goal #3   Pt, wife will monitor heart rate with pulse oximeter daily for next 4-5 days and report heart rate over 100 to Dr. [name]  [facility] CM Short Term Goal #3 Start Date  02/11/17  Interventions for Short Term Goal #3  see today's note, wife will continue to monitor heart rate, pt saw cardiologist yesterday and will be wearing heart monitor     PLAN See pt for home visit next month  Jacqlyn Larsen [facility], [place] Coordinator [phone]

## 2017-02-22 ENCOUNTER — Other Ambulatory Visit: Payer: Self-pay | Admitting: Pharmacist

## 2017-02-22 NOTE — Patient Outreach (Signed)
Mrs. Cozzolino reports that she needs to follow up with Social Security regarding the extra help pre-denial letter that she received for the patient in the mail. Reports that she does not believe that the information that they have is correct. Reports that she is currently without a car to get to [employer], but that she will have one later in the week. Reports that she will call to follow up with me once she has more information. If have not heard back from Mr and Mrs. Livermore in 2 weeks, will follow up again at that time.  Harlow Asa, PharmD, [place] Management [phone]

## 2017-02-25 ENCOUNTER — Encounter (INDEPENDENT_AMBULATORY_CARE_PROVIDER_SITE_OTHER): Payer: PPO

## 2017-02-25 DIAGNOSIS — R000 Tachycardia, unspecified: Secondary | ICD-10-CM

## 2017-03-09 ENCOUNTER — Telehealth: Payer: Self-pay

## 2017-03-09 NOTE — Telephone Encounter (Signed)
-----   Message from Acquanetta Chain, LPN sent at 70/92/9574  2:42 PM EST -----   ----- Message ----- From: Satira Sark, MD Sent: 03/09/2017  12:53 PM To: Merlene Laughter, LPN  Results reviewed.  Based on available strips for review, no significant arrhythmias were documented.  We will continue with observation on current medications. A copy of this test should be forwarded to [name], MD.

## 2017-03-09 NOTE — Telephone Encounter (Signed)
Patient notified. Routed to PCP 

## 2017-03-10 ENCOUNTER — Other Ambulatory Visit: Payer: Self-pay | Admitting: Pharmacist

## 2017-03-10 NOTE — Patient Outreach (Addendum)
Call to follow up with Mr and Mrs. Adelsberger regarding medication assistance. Left a HIPAA compliant message on the patient's voicemail. If have not heard from patient by next week, will give him another call at that time.  Harlow Asa, PharmD, [place] [facility] [phone]

## 2017-03-15 DIAGNOSIS — I10 Essential (primary) hypertension: Secondary | ICD-10-CM | POA: Diagnosis not present

## 2017-03-15 DIAGNOSIS — F419 Anxiety disorder, unspecified: Secondary | ICD-10-CM | POA: Diagnosis not present

## 2017-03-15 DIAGNOSIS — I5032 Chronic diastolic (congestive) heart failure: Secondary | ICD-10-CM | POA: Diagnosis not present

## 2017-03-15 DIAGNOSIS — E782 Mixed hyperlipidemia: Secondary | ICD-10-CM | POA: Diagnosis not present

## 2017-03-15 DIAGNOSIS — G4733 Obstructive sleep apnea (adult) (pediatric): Secondary | ICD-10-CM | POA: Diagnosis not present

## 2017-03-15 DIAGNOSIS — R609 Edema, unspecified: Secondary | ICD-10-CM | POA: Diagnosis not present

## 2017-03-15 DIAGNOSIS — E1165 Type 2 diabetes mellitus with hyperglycemia: Secondary | ICD-10-CM | POA: Diagnosis not present

## 2017-03-15 DIAGNOSIS — Z6841 Body Mass Index (BMI) 40.0 and over, adult: Secondary | ICD-10-CM | POA: Diagnosis not present

## 2017-03-16 ENCOUNTER — Other Ambulatory Visit: Payer: Self-pay | Admitting: *Deleted

## 2017-03-16 ENCOUNTER — Encounter: Payer: Self-pay | Admitting: *Deleted

## 2017-03-16 NOTE — Patient Outreach (Signed)
[facility]) Care Management   03/16/2017  [name] 1933-12-04 [id number]  [name] is an 83 y.o. male  Subjective: Routine home visit with pt, HIPAA verified, wife present, pt reports he saw primary MD yesterday, wore heart monitor for 7 days and "showed nothing"  "my heart rate has been in 60's"  States "haven't been checking CBG or recording, not weighing everyday, only on occasion"  Objective:   Vitals:   03/16/17 1246  BP: 124/66  Pulse: 68  Resp: 18  SpO2: 94%   ROS  Physical Exam  Constitutional: He is oriented to person, place, and time. He appears well-developed and well-nourished.  HENT:  Head: Normocephalic.  Neck: Normal range of motion. Neck supple.  Cardiovascular: Normal rate.  Respiratory: Effort normal and breath sounds normal.  GI: Soft. Bowel sounds are normal.  Musculoskeletal: Normal range of motion. He exhibits edema.  1+ edema LE Bil  Neurological: He is alert and oriented to person, place, and time.  Skin: Skin is warm and dry.  Psychiatric: He has a normal mood and affect. His behavior is normal. Thought content normal.    Encounter Medications:   Outpatient Encounter Medications as of 03/16/2017  Medication Sig Note  . allopurinol (ZYLOPRIM) 100 MG tablet Take 1 tablet (100 mg total) by mouth daily.   Marland Kitchen alprazolam (XANAX) 2 MG tablet Take 1 mg by mouth at bedtime.    Marland Kitchen aspirin EC 81 MG tablet Take 81 mg by mouth every morning.   . clopidogrel (PLAVIX) 75 MG tablet take 1 tablet by mouth once daily WITH BREAKFAST   . cyclobenzaprine (FLEXERIL) 10 MG tablet Take 10 mg by mouth at bedtime.    Marland Kitchen diltiazem (CARDIZEM CD) 240 MG 24 hr capsule Take 240 mg by mouth daily.   . metolazone (ZAROXOLYN) 2.5 MG tablet 01/29/16 change in directions Take one day for 2-3 days as needed only for weight gain of 3-5 lbs in a 48 hour period.   . metoprolol tartrate (LOPRESSOR) 25 MG tablet Take 1 tablet (25 mg total) by mouth 2 (two)  times daily.   . nitroGLYCERIN (NITROLINGUAL) 0.4 MG/SPRAY spray use 1-2 sprays under the tongue if needed for chest pain *MAXIMUM OF 3 SPRAYS IN 15 MINUTES   . polyethylene glycol powder (GLYCOLAX/MIRALAX) powder Take 17 g by mouth daily as needed (constipation).    . psyllium (METAMUCIL) 58.6 % powder Take by mouth every morning. 05/25/2014: Heaping tsp  . tamsulosin (FLOMAX) 0.4 MG CAPS capsule Take 0.4 mg by mouth every evening.    . torsemide (DEMADEX) 20 MG tablet Take Two Tablets ( 40 mg )  Daily Alternating with One Tablet (20 mg) Daily   . TOUJEO SOLOSTAR 300 UNIT/ML SOPN inject 60 units BID   . isosorbide mononitrate (IMDUR) 30 MG 24 hr tablet Take 1 tablet (30 mg total) by mouth daily. (Patient taking differently: Take 30 mg by mouth daily. 12/02/16- pt is taking as prescribed)    No facility-administered encounter medications on file as of 03/16/2017.     Functional Status:   In your present state of health, do you have any difficulty performing the following activities: 12/02/2016 07/20/2016  Hearing? Y N  Vision? N N  Difficulty concentrating or making decisions? N N  Walking or climbing stairs? Y N  Dressing or bathing? Y N  Doing errands, shopping? Y N  Preparing Food and eating ? Y -  Using the Toilet? N -  In the  past six months, have you accidently leaked urine? N -  Do you have problems with loss of bowel control? N -  Managing your Medications? Y -  Managing your Finances? Y -  Comment wife takes care of all financial issues -  Housekeeping or managing your Housekeeping? Y -  Some recent data might be hidden    Fall/Depression Screening:    Fall Risk  01/21/2017 12/02/2016 11/26/2016  Falls in the past year? No No No  Risk for fall due to : Impaired balance/gait;Impaired mobility;Medication side effect Impaired balance/gait;Impaired mobility;Medication side effect Impaired balance/gait;Impaired mobility;Medication side effect;Impaired vision   PHQ 2/9 Scores 12/02/2016  11/26/2016  PHQ - 2 Score 2 2  PHQ- 9 Score 6 6    Assessment:  RN CM reinforced HF action plan and importance of weighing daily, pt does not want to work towards this goal citing he is not going to weigh daily, pt is not checking CBG and states he is most likely not going to do this. RN CM reviewed correlation of daily weights to know how much diuretic to take (prn diuretic).  RN CM discussed discharge plan and will discharge today as pt not willing to work towards any further goals.  Rn CM sent [facility] pharmacist message and informed of community RN CM discharge.  RN CM faxed primary MD office note reporting RN CM discharge and pt did not meet all goals.  THN CM Care Plan Problem One     Most Recent Value  Care Plan Problem One  Knowledge deficit related to diabetes  Role Documenting the Problem One  Care Management Coordinator  Care Plan for Problem One  Active  THN Long Term Goal   Pt will have reduction in Hgb AIC by 2 points within 90 days  THN Long Term Goal Start Date  12/03/16  Interventions for Problem One Long Term Goal  RN CM reviewed with pt and wife importance of keeping detailed CBG log in [facility] calendar, importance of following carbohydrate modified diet and plate method  THN CM Short Term Goal #1    Pt will consistently/ daily check and record CBG in [facility] calendar  [facility] CM Short Term Goal #1 Start Date  02/11/17 [goal restarted- needs reinforcement]  Interventions for Short Term Goal #1  Pt no longer wants to work towards this goal- pt states he is not checking CBG but on occasion and no plans to change this  THN CM Short Term Goal #2   Pt will verbalize healthier food choices being mindful of carbohydrate intake within 30 days.  THN CM Short Term Goal #2 Start Date  12/24/16 [goal restarted]  THN CM Short Term Goal #2 Met Date  01/21/17    [facility] CM Care Plan Problem Two     Most Recent Value  Care Plan Problem Two  Knowledge deficit related to CHF  Role Documenting the Problem Two   Care Management [place] for Problem Two  Active  THN CM Short Term Goal #1   Pt will weigh daily and record in [facility] calendar within 30 days.  THN CM Short Term Goal #2   Pt, wife will verbalize CHF action plan/ zones within 30 days.  THN CM Short Term Goal #2 Start Date  01/21/17 [goal restarted]  THN CM Short Term Goal #2 Met Date  02/11/17  THN CM Short Term Goal #3   Pt, wife will monitor heart rate with pulse oximeter daily for next 4-5 days  and report heart rate over 100 to Dr. [name]  [facility] CM Short Term Goal #3 Start Date  02/11/17  [facility] CM Short Term Goal #3 Met Date  03/16/17  Interventions for Short Term Goal #3  Pt wore heart monitor and uneventful, wife reports all heart rate in 60's range      Plan: discharge today  Jacqlyn Larsen [facility], [place] Coordinator [phone]

## 2017-03-17 ENCOUNTER — Other Ambulatory Visit: Payer: Self-pay | Admitting: Pharmacist

## 2017-03-17 NOTE — Patient Outreach (Signed)
Call to follow up with Mr and Mrs. Zent regarding medication assistance. Speak with Mrs. Hill. HIPAA identifiers verified.  Mrs. Dedman reports that they have received the official response from Social Security indicating that Mr. Chavarin has been denied for Extra help. Mrs. Kaeser reports that she has further questions about this decision for Social Security. Encourage patient to go to the Social Security office with the letter to follow up. Remind patient to retain this denial letter.  Mrs. Garner reports that she and the patient have just picked up a month supply of his Toujeo. Review with Mrs. Moch the eligibility requirements for the [facility] Patient Assistance Program through [employer]. Caregiver reports that the patient meets the income and out of pocket requirements for this calendar year. Reports that the patient is interested in having our help with completing the application for assistance with Toujeo through [employer]. Let Mrs. Weatherford know that we will send this application to the patient and that I will request that [employer] follow up with her regarding this application. Request that Mrs. Creed and the patient request an updated out of pocket expense report from the patient's pharmacy.  PLAN  1) Mr and Mrs. Wolfinger to obtain an out of pocket expense report from the patient's pharmacy.  2) Will refer patient to [place] to request that she assist the patient with applying for the [facility] Patient Assistance Program through [place].  3) Will follow with Caryl Pina while she assists the patient with this application.   Harlow Asa, PharmD, [place] Management [phone]

## 2017-03-18 ENCOUNTER — Other Ambulatory Visit: Payer: Self-pay | Admitting: Pharmacy Technician

## 2017-03-18 NOTE — Patient Outreach (Signed)
[place] [facility]) Care Management  03/18/2017  [place] Aug 27, 1933 [id number]  Unsuccessful outreach call #1 to patient.Called in reference to patient assistance application requested by Pharmacist Harlow Asa. Left HIPAA appropriate voicemail to return my call.   Maud Deed [place], [place] Management [phone]

## 2017-03-18 NOTE — Patient Outreach (Signed)
[place] [facility]) Care Management  03/18/2017  Deaunte Dente Bassett July 09, 1933 [id number]  Successful incoming call from patients wife, HIPAA identifiers verified. Informed Mrs. Deringer that I had mailed out patients portion of application for Toujeo. Informed her that I had highlighted portions of the applications he would need to fill out. Requested that she contact me when she mails the application back in to me and include an OOP from pharmacy as well as copy of Social Security denial letter. Mrs. Prabhakar acknowledged understanding and stated she would call if she had any other questions. Will follow up with patient in 10 days if I haven't received application or phone call from patient/wife.  Maud Deed [place], [place] Management [phone]

## 2017-03-21 DIAGNOSIS — R0902 Hypoxemia: Secondary | ICD-10-CM | POA: Diagnosis not present

## 2017-03-21 DIAGNOSIS — G4733 Obstructive sleep apnea (adult) (pediatric): Secondary | ICD-10-CM | POA: Diagnosis not present

## 2017-03-29 ENCOUNTER — Ambulatory Visit: Payer: Self-pay | Admitting: Pharmacy Technician

## 2017-04-02 ENCOUNTER — Other Ambulatory Visit: Payer: Self-pay | Admitting: Pharmacy Technician

## 2017-04-02 NOTE — Patient Outreach (Signed)
[name] [facility]) Care Management  04/02/2017  [name] 07/25/1933 [id number]  Successful outreach call to patient wife, HIPAA identifiers verified. [name]  Verified that she mailed out patients portion of Toujeo application one day last week. I informed her that due to power issues within our building we are all working offsite and that I would follow with her once I've sent faxed the application into [employer].   Maud Deed [place], [place] Management [phone]

## 2017-04-09 ENCOUNTER — Other Ambulatory Visit: Payer: Self-pay | Admitting: Pharmacy Technician

## 2017-04-09 NOTE — Patient Outreach (Signed)
[place] [facility]) Care Management  04/09/2017  [place] 05-Jan-1934 [id number]  Successful faxed application for Toujeo to [employer] patient assistance.  Will have Architectural technologist Research scientist (life sciences)) follow up with company on 04/12/17 in my absence.  Maud Deed [place], [place] Management [phone]

## 2017-04-09 NOTE — Patient Outreach (Signed)
[facility]) Care Management  04/09/2017  [name] [birth year] [id number]  Successful incoming call received from [name]. HIPAA identifiers verified. [name] stated that the application she mailed out on 03/25/17 came back to her today as undeliverable. I informed her that we would need to try to have application faxed in by 04/12/17 and asked if she has access to a fax machine. She stated that she would take application to pharmacy and have it faxed into me today.  Maud Deed [place], [place] Management [phone]

## 2017-04-12 ENCOUNTER — Other Ambulatory Visit: Payer: Self-pay | Admitting: Pharmacy Technician

## 2017-04-12 NOTE — Patient Outreach (Signed)
[place] [facility]) Care Management  04/12/2017  [place] 1934/03/14 [id number]   (Covering for Etter Sjogren, CPhT)  Successful outreach call to [facility] patient assistance program in reference to an application that was faxed 04/09/2017. I spoke with Sherlynn Stalls and she stated the application had been marked incomplete due to page 2 missing with the patient's signature. Upon further evaluation she realized all of the necessary documentation to process the application had been received. She was resubmitting the application and the application should be processed by Thursday. Caryl Pina will follow-up with [employer] and the patient once she returns this week.  Doreene Burke, [place] [phone]

## 2017-04-14 ENCOUNTER — Other Ambulatory Visit: Payer: Self-pay | Admitting: Pharmacy Technician

## 2017-04-14 ENCOUNTER — Other Ambulatory Visit: Payer: Self-pay | Admitting: Pharmacist

## 2017-04-14 ENCOUNTER — Ambulatory Visit (INDEPENDENT_AMBULATORY_CARE_PROVIDER_SITE_OTHER): Payer: PPO | Admitting: Cardiology

## 2017-04-14 ENCOUNTER — Encounter: Payer: Self-pay | Admitting: Cardiology

## 2017-04-14 ENCOUNTER — Other Ambulatory Visit: Payer: Self-pay

## 2017-04-14 VITALS — BP 153/67 | HR 69 | Ht 65.0 in | Wt 275.0 lb

## 2017-04-14 DIAGNOSIS — Z789 Other specified health status: Secondary | ICD-10-CM

## 2017-04-14 DIAGNOSIS — I5032 Chronic diastolic (congestive) heart failure: Secondary | ICD-10-CM

## 2017-04-14 DIAGNOSIS — N183 Chronic kidney disease, stage 3 unspecified: Secondary | ICD-10-CM

## 2017-04-14 DIAGNOSIS — I2510 Atherosclerotic heart disease of native coronary artery without angina pectoris: Secondary | ICD-10-CM

## 2017-04-14 NOTE — Patient Outreach (Signed)
Leave a message with Amy in the office of Tawni Carnes requesting that the provider call in a prescription for the patient's Toujeo today for a 90 day supply, rather than 30 day supply, to the patient's pharmacy for cost savings.  Harlow Asa, PharmD, [place] Management [phone]

## 2017-04-14 NOTE — Patient Outreach (Signed)
[place] [facility]) Care Management  04/14/2017  MAL ASHER Jun 26, 1933 [id number]   Successful outreach call to [facility] patient assistance in reference to application sent in for [place]. Representative Laurence Aly stated that Part D plans have a cut off for mid December in which they are allowed to submit applications for Patient Assistance. Patient will have to resubmit application for next year after reaching the 5% out of pocket spending.  Contacted Harlow Asa (Pharmacist) and informed her. She stated she will inform patient and have [place] taken off care team.  Maud Deed. [place], [place] Management [phone]

## 2017-04-14 NOTE — Patient Outreach (Signed)
[place] [facility]) Care Management  04/14/2017  [place] 06/20/1933 [id number]   Receive call from Pharmacy Technician Etter Sjogren regarding application for [facility] patient assistance. Caryl Pina reports that the assistance program has a cut off for processing these applications for Part D patients for mid December and, given this deadline, patient will not be approved for assistance for this calendar year.  Called and speak with Mrs. Florea (on consent). HIPAA identifiers verified. Let Mrs. Deanda know about the status of the Toujeo patient assistance application. Mrs. Brandow reports that the patient has Toujeo for now and that they are planning to get the patient another refill today. Mrs. Boeve reports that they have been receiving a 30 day supply at a time. Let Mrs. Oborn know about the cost savings through their plan of getting a 90 day, rather than 30 day, supply. Let Mrs. Hodge know that I will call to follow up with the patient's pharmacy and provider to request that the patient receive a 90 day supply for cost savings. Mrs. Gorum expresses appreciation.  Mrs. Obeso reports that she has not yet called to follow up with Social Security regarding her concern that their financial information for the patient is incorrect. Encourage Mrs. Rickel to make this call.  Remind Mrs. Sanderlin that the patient will be eligible to apply for patient assistance for Toujeo from [place] again in 2019 after reaching the 5% out of pocket spending requirement. Mrs. Breden states that she will call to follow up with me when they are close to this out of pocket limit requirement.  Mrs. Zenon denies any further medication questions/concerns at this time. Confirm that she has my phone number.  PLAN:  1) I will call to follow up with the patient's pharmacy and provider to request that the patient receive a 90 day supply for cost savings.  2) Mrs. Cooksey to call to follow up with  Social Security.  3) Patient and wife to call us when the patient has met the 5% out of pocket spend requirement for the [employer] patient assistance application for [id number].  4) Will close pharmacy episode after reaching out to patient's pharmacy and provider.  Harlow Asa, PharmD, [place] Management [phone]

## 2017-04-14 NOTE — Progress Notes (Signed)
Cardiology Office Note  Date: 04/14/2017   ID: PAPE PARSON, DOB 09-12-1933, MRN [id number]  PCP: [name], MD  Primary Cardiologist: [name], MD   Chief Complaint  Patient presents with  . Coronary Artery Disease  . Diastolic heart failure    History of Present Illness: [name] is an 84 y.o. male last seen in November. He presents today with his granddaughter for a follow-up visit. Overall states that he has been doing about the same. His weight has crept up as documented below. States that he ate well over the holidays. His leg edema is relatively mild and stable. He does not report any palpitations or chest pain.  Cardiac monitor obtained in November 2018 demonstrated sinus rhythm with occasional PACs and PVCs. No obvious bradycardia or unusual tachycardia noted.  We went over his medications. Discussed using metolazone for 2 or 3 days in a row to try to get his weight back down the right direction. He does have renal insufficiency with limits use on a standing basis.  Past Medical History:  Diagnosis Date  . Allergic rhinitis   . Anxiety   . Arthritis   . COPD (chronic obstructive pulmonary disease) ([place])   . Coronary atherosclerosis of native coronary artery    a. CABG x 4 in 1994. Previous stent placement to SVG to distal RCA;  b. DES to native Cx in 2006;  c. DES SVG to RCA 05/08/11; d. Inf MI/Cath/PCI: LM 25, LAD 100, LCX patent stent, 50 into OM, RI 60-70, RCA 100, LIMA->LAD nl, VG->Diag 100 old, VG->RCA/PL 99 (DES x 1), EF 50-55%.  . Depression    With anxious features/hx panic attacks  . Essential hypertension, benign   . GERD (gastroesophageal reflux disease)   . Hyperlipidemia    Not on statin 2/2 hx of intolerance.   . Morbid obesity ([place])   . Nephrolithiasis   . Pneumonia   . Sleep apnea   . ST elevation myocardial infarction (STEMI) of inferior wall ([place])    4/14 - DES SVG to PDA  . Type 2 diabetes mellitus ([place])     Past  Surgical History:  Procedure Laterality Date  . CORONARY ARTERY BYPASS GRAFT  1994  . EYE SURGERY    . LEFT HEART CATHETERIZATION WITH CORONARY ANGIOGRAM N/A 05/08/2011   Procedure: LEFT HEART CATHETERIZATION WITH CORONARY ANGIOGRAM;  Surgeon: [name], MD;  Location: [facility] CATH LAB;  Service: Cardiovascular;  Laterality: N/A;  . LEFT HEART CATHETERIZATION WITH CORONARY ANGIOGRAM N/A 08/08/2012   Procedure: LEFT HEART CATHETERIZATION WITH CORONARY ANGIOGRAM;  Surgeon: [name], MD;  Location: [facility] CATH LAB;  Service: Cardiovascular;  Laterality: N/A;  . PERCUTANEOUS CORONARY STENT INTERVENTION (PCI-S) Right 05/08/2011   Procedure: PERCUTANEOUS CORONARY STENT INTERVENTION (PCI-S);  Surgeon: [name], MD;  Location: [facility] CATH LAB;  Service: Cardiovascular;  Laterality: Right;  . PERCUTANEOUS CORONARY STENT INTERVENTION (PCI-S) N/A 08/08/2012   Procedure: PERCUTANEOUS CORONARY STENT INTERVENTION (PCI-S);  Surgeon: [name], MD;  Location: [facility] CATH LAB;  Service: Cardiovascular;  Laterality: N/A;    Current Outpatient Medications  Medication Sig Dispense Refill  . allopurinol (ZYLOPRIM) 100 MG tablet Take 1 tablet (100 mg total) by mouth daily. 30 tablet 2  . alprazolam (XANAX) 2 MG tablet Take 1 mg by mouth at bedtime.     Marland Kitchen aspirin EC 81 MG tablet Take 81 mg by mouth every morning.    . clopidogrel (PLAVIX) 75 MG tablet  take 1 tablet by mouth once daily WITH BREAKFAST 90 tablet 1  . cyclobenzaprine (FLEXERIL) 10 MG tablet Take 10 mg by mouth at bedtime.     Marland Kitchen diltiazem (CARDIZEM CD) 240 MG 24 hr capsule Take 240 mg by mouth daily.    . isosorbide mononitrate (IMDUR) 30 MG 24 hr tablet Take 1 tablet (30 mg total) by mouth daily. (Patient taking differently: Take 30 mg by mouth daily. 12/02/16- pt is taking as prescribed) 90 tablet 3  . metolazone (ZAROXOLYN) 2.5 MG tablet 01/29/16 change in directions Take one day for 2-3 days as needed only for weight gain of 3-5  lbs in a 48 hour period. 15 tablet 1  . metoprolol tartrate (LOPRESSOR) 25 MG tablet Take 1 tablet (25 mg total) by mouth 2 (two) times daily. 180 tablet 3  . nitroGLYCERIN (NITROLINGUAL) 0.4 MG/SPRAY spray use 1-2 sprays under the tongue if needed for chest pain *MAXIMUM OF 3 SPRAYS IN 15 MINUTES 25 g 3  . polyethylene glycol powder (GLYCOLAX/MIRALAX) powder Take 17 g by mouth daily as needed (constipation).     . psyllium (METAMUCIL) 58.6 % powder Take by mouth every morning.    . tamsulosin (FLOMAX) 0.4 MG CAPS capsule Take 0.4 mg by mouth every evening.     . torsemide (DEMADEX) 20 MG tablet Take Two Tablets ( 40 mg )  Daily Alternating with One Tablet (20 mg) Daily 45 tablet 6  . TOUJEO SOLOSTAR 300 UNIT/ML SOPN inject 60 units BID  0   No current facility-administered medications for this visit.    Allergies:  Zocor [simvastatin] and Lipitor [atorvastatin]   Social History: The patient  reports that  has never smoked. he has never used smokeless tobacco. He reports that he does not drink alcohol or use drugs.   ROS:  Please see the history of present illness. Otherwise, complete review of systems is positive for hearing loss.  All other systems are reviewed and negative.   Physical Exam: VS:  BP (!) 153/67   Pulse 69   Ht 5\' 5"  (1.651 m)   Wt 275 lb (124.7 kg)   SpO2 92% Comment: on room air  BMI 45.76 kg/m , BMI Body mass index is 45.76 kg/m.  Wt Readings from Last 3 Encounters:  04/14/17 275 lb (124.7 kg)  02/18/17 269 lb (122 kg)  02/11/17 264 lb (119.7 kg)    General: Morbidly obese male, appears comfortable at rest. HEENT: Conjunctiva and lids normal, oropharynx clear. Neck: Supple, no elevated JVP or carotid bruits, no thyromegaly. Lungs: Clear to auscultation, nonlabored breathing at rest. Cardiac: Regular rate and rhythm, no S3 or significant systolic murmur, no pericardial rub. Abdomen: Obese, nontender, bowel sounds present. Extremities: 1+ leg edema, distal  pulses 2+. Skin: Warm and dry. Musculoskeletal: No kyphosis. Neuropsychiatric: Alert and oriented x3, affect grossly appropriate.  ECG: I personally reviewed the tracing from 02/18/2009 which showed sinus rhythm with prolonged PR interval, old anterior infarct pattern and PVCs.  Recent Labwork: 07/19/2016: ALT 15; AST 7; B Natriuretic Peptide 227.0 07/20/2016: Hemoglobin 13.1; Platelets 157 02/18/2017: BUN 65; Creatinine, Ser 2.58; Potassium 4.0; Sodium 136   Other Studies Reviewed Today:  Echocardiogram 07/20/2016: Study Conclusions  - Procedure narrative: Transthoracic echocardiography. Image quality was fair. Contrast enhancement utilized. - Left ventricle: The cavity size was normal. There was moderate concentric hypertrophy. Systolic function was normal. The estimated ejection fraction was in the range of 50% to 55%. Doppler parameters are consistent with abnormal left ventricular  relaxation (grade 1 diastolic dysfunction). Doppler parameters are consistent with high ventricular filling pressure. - Regional wall motion abnormality: Hypokinesis of the mid anterior, mid anteroseptal, and mid inferoseptal myocardium. - Aortic valve: Trileaflet; mildly calcified leaflets. - Mitral valve: Calcified annulus. Mildly calcified leaflets . - Left atrium: The atrium was mildly dilated. - Right ventricle: Systolic function was mildly reduced.  Assessment and Plan:  1. Chronic diastolic heart fair. Weight is up compared to last visit. He will continue with Demadex and use Zaroxolyn for 2 or 3 days in a row and go back to as needed use. We discussed sodium restriction and fluid guidelines. Follow-up BMET for his next visit.  2. CKD stage III, creatinine 2.0 2.5 range.  3. CAD status post CABG with graft disease and subsequent PCI. He reports no active angina. He continues on aspirin, has a statin intolerance.  4. Morbid obesity.  Current medicines were reviewed with the  patient today.   Orders Placed This Encounter  Procedures  . Basic metabolic panel    Disposition: Follow-up in 3 months.  Signed, Satira Sark, MD, [facility] 04/14/2017 2:24 PM    [place] [facility] [place], [place], [place] [address] Phone: [phone]; Fax: [phone]

## 2017-04-14 NOTE — Patient Instructions (Signed)
Medication Instructions:  Your physician recommends that you continue on your current medications as directed. Please refer to the Current Medication list given to you today.  Labwork: BMET Orders given today  Testing/Procedures: NONE  Follow-Up: Your physician recommends that you schedule a follow-up appointment in: 3 MONTHS WITH DR. MCDOWELL  Any Other Special Instructions Will Be Listed Below (If Applicable).  If you need a refill on your cardiac medications before your next appointment, please call your pharmacy.

## 2017-04-14 NOTE — Patient Outreach (Addendum)
Call to follow up with the patient's pharmacy to request that the patient receive a 90 day supply of his Toujeo for cost savings. Pharmacy technician reports that the prescription was written for only a 30 day supply/fill, rather than a 90 day supply. Request that the pharmacy fax the provider's, Lance Bosch, office to request a 90 day supply prescription for the Toujeo.  Harlow Asa, PharmD, [place] Management [phone]

## 2017-04-21 DIAGNOSIS — R0902 Hypoxemia: Secondary | ICD-10-CM | POA: Diagnosis not present

## 2017-04-21 DIAGNOSIS — G4733 Obstructive sleep apnea (adult) (pediatric): Secondary | ICD-10-CM | POA: Diagnosis not present

## 2017-05-22 DIAGNOSIS — G4733 Obstructive sleep apnea (adult) (pediatric): Secondary | ICD-10-CM | POA: Diagnosis not present

## 2017-05-22 DIAGNOSIS — R0902 Hypoxemia: Secondary | ICD-10-CM | POA: Diagnosis not present

## 2017-06-18 ENCOUNTER — Other Ambulatory Visit: Payer: Self-pay | Admitting: *Deleted

## 2017-06-18 MED ORDER — METOLAZONE 2.5 MG PO TABS: ORAL_TABLET | ORAL | 1 refills | Status: DC

## 2017-06-19 DIAGNOSIS — R0902 Hypoxemia: Secondary | ICD-10-CM | POA: Diagnosis not present

## 2017-06-19 DIAGNOSIS — G4733 Obstructive sleep apnea (adult) (pediatric): Secondary | ICD-10-CM | POA: Diagnosis not present

## 2017-06-24 ENCOUNTER — Other Ambulatory Visit: Payer: Self-pay | Admitting: *Deleted

## 2017-06-24 MED ORDER — CLOPIDOGREL BISULFATE 75 MG PO TABS: ORAL_TABLET | ORAL | 1 refills | Status: DC

## 2017-07-20 DIAGNOSIS — G4733 Obstructive sleep apnea (adult) (pediatric): Secondary | ICD-10-CM | POA: Diagnosis not present

## 2017-07-20 DIAGNOSIS — R0902 Hypoxemia: Secondary | ICD-10-CM | POA: Diagnosis not present

## 2017-07-23 DIAGNOSIS — I5032 Chronic diastolic (congestive) heart failure: Secondary | ICD-10-CM | POA: Diagnosis not present

## 2017-07-26 ENCOUNTER — Telehealth: Payer: Self-pay

## 2017-07-26 NOTE — Telephone Encounter (Signed)
Patient notified. Routed to PCP 

## 2017-07-28 NOTE — Progress Notes (Signed)
Cardiology Office Note  Date: 07/30/2017   ID: DIANA ARMIJO, DOB Mar 23, 1934, MRN [id number]  PCP: [name], MD  Primary Cardiologist: [name], MD   Chief Complaint  Patient presents with  . Diastolic heart failure    History of Present Illness: [name] is an 84 y.o. male last seen in January.  He presents for a follow-up visit.  States that his weight has been relatively stable around 270 at home.  He reports compliance with Demadex. At the last visit we temporarily increased his use of Zaroxolyn in addition to [facility] for additional fluid weight loss.  Follow-up lab work is reviewed below.  Degree of renal dysfunction has been stable.  He does not report any angina symptoms or increasing nitroglycerin use.  I reviewed his cardiac medications which are outlined below.  Past Medical History:  Diagnosis Date  . Allergic rhinitis   . Anxiety   . Arthritis   . COPD (chronic obstructive pulmonary disease) ([place])   . Coronary atherosclerosis of native coronary artery    a. CABG x 4 in 1994. Previous stent placement to SVG to distal RCA;  b. DES to native Cx in 2006;  c. DES SVG to RCA 05/08/11; d. Inf MI/Cath/PCI: LM 25, LAD 100, LCX patent stent, 50 into OM, RI 60-70, RCA 100, LIMA->LAD nl, VG->Diag 100 old, VG->RCA/PL 99 (DES x 1), EF 50-55%.  . Depression    With anxious features/hx panic attacks  . Essential hypertension, benign   . GERD (gastroesophageal reflux disease)   . Hyperlipidemia    Not on statin 2/2 hx of intolerance.   . Morbid obesity ([place])   . Nephrolithiasis   . Pneumonia   . Sleep apnea   . ST elevation myocardial infarction (STEMI) of inferior wall ([place])    4/14 - DES SVG to PDA  . Type 2 diabetes mellitus ([place])     Past Surgical History:  Procedure Laterality Date  . CORONARY ARTERY BYPASS GRAFT  1994  . EYE SURGERY    . LEFT HEART CATHETERIZATION WITH CORONARY ANGIOGRAM N/A 05/08/2011   Procedure: LEFT HEART CATHETERIZATION  WITH CORONARY ANGIOGRAM;  Surgeon: [name], MD;  Location: [facility] CATH LAB;  Service: Cardiovascular;  Laterality: N/A;  . LEFT HEART CATHETERIZATION WITH CORONARY ANGIOGRAM N/A 08/08/2012   Procedure: LEFT HEART CATHETERIZATION WITH CORONARY ANGIOGRAM;  Surgeon: [name], MD;  Location: [facility] CATH LAB;  Service: Cardiovascular;  Laterality: N/A;  . PERCUTANEOUS CORONARY STENT INTERVENTION (PCI-S) Right 05/08/2011   Procedure: PERCUTANEOUS CORONARY STENT INTERVENTION (PCI-S);  Surgeon: [name], MD;  Location: [facility] CATH LAB;  Service: Cardiovascular;  Laterality: Right;  . PERCUTANEOUS CORONARY STENT INTERVENTION (PCI-S) N/A 08/08/2012   Procedure: PERCUTANEOUS CORONARY STENT INTERVENTION (PCI-S);  Surgeon: [name], MD;  Location: [facility] CATH LAB;  Service: Cardiovascular;  Laterality: N/A;    Current Outpatient Medications  Medication Sig Dispense Refill  . allopurinol (ZYLOPRIM) 100 MG tablet Take 1 tablet (100 mg total) by mouth daily. 30 tablet 2  . alprazolam (XANAX) 2 MG tablet Take 1 mg by mouth at bedtime.     Marland Kitchen aspirin EC 81 MG tablet Take 81 mg by mouth every morning.    . clopidogrel (PLAVIX) 75 MG tablet take 1 tablet by mouth once daily WITH BREAKFAST 90 tablet 1  . cyclobenzaprine (FLEXERIL) 10 MG tablet Take 10 mg by mouth at bedtime.     Marland Kitchen diltiazem (CARDIZEM CD) 240 MG 24  hr capsule Take 240 mg by mouth daily.    . isosorbide mononitrate (IMDUR) 30 MG 24 hr tablet Take 1 tablet (30 mg total) by mouth daily. (Patient taking differently: Take 30 mg by mouth daily. 12/02/16- pt is taking as prescribed) 90 tablet 3  . metolazone (ZAROXOLYN) 2.5 MG tablet 01/29/16 change in directions Take one day for 2-3 days as needed only for weight gain of 3-5 lbs in a 48 hour period. 15 tablet 1  . metoprolol tartrate (LOPRESSOR) 25 MG tablet Take 1 tablet (25 mg total) by mouth 2 (two) times daily. 180 tablet 3  . nitroGLYCERIN (NITROLINGUAL) 0.4 MG/SPRAY spray use  1-2 sprays under the tongue if needed for chest pain *MAXIMUM OF 3 SPRAYS IN 15 MINUTES 25 g 3  . polyethylene glycol powder (GLYCOLAX/MIRALAX) powder Take 17 g by mouth daily as needed (constipation).     . psyllium (METAMUCIL) 58.6 % powder Take by mouth every morning.    . tamsulosin (FLOMAX) 0.4 MG CAPS capsule Take 0.4 mg by mouth every evening.     . torsemide (DEMADEX) 20 MG tablet Take Two Tablets ( 40 mg )  Daily Alternating with One Tablet (20 mg) Daily 45 tablet 6  . TOUJEO SOLOSTAR 300 UNIT/ML SOPN inject 60 units BID  0   No current facility-administered medications for this visit.    Allergies:  Zocor [simvastatin] and Lipitor [atorvastatin]   Social History: The patient  reports that he has never smoked. He has never used smokeless tobacco. He reports that he does not drink alcohol or use drugs.   ROS:  Please see the history of present illness. Otherwise, complete review of systems is positive for hearing loss.  All other systems are reviewed and negative.   Physical Exam: VS:  BP 136/73   Pulse 68   Ht 5\' 5"  (1.651 m)   Wt 273 lb (123.8 kg)   SpO2 94%   BMI 45.43 kg/m , BMI Body mass index is 45.43 kg/m.  Wt Readings from Last 3 Encounters:  07/30/17 273 lb (123.8 kg)  04/14/17 275 lb (124.7 kg)  02/18/17 269 lb (122 kg)    General: Morbidly obese male, appears comfortable at rest. HEENT: Conjunctiva and lids normal, oropharynx clear. Neck: Supple, no elevated JVP or carotid bruits, no thyromegaly. Lungs: Clear to auscultation, nonlabored breathing at rest. Cardiac: Regular rate and rhythm, no S3 or significant systolic murmur, no pericardial rub. Abdomen: Obese, bowel sounds present, no guarding or rebound. Extremities: Mild lower leg edema, distal pulses 2+. Skin: Warm and dry. Musculoskeletal: No kyphosis. Neuropsychiatric: Alert and oriented x3, affect grossly appropriate.  ECG: I personally reviewed the tracing from 02/18/2017 which showed sinus rhythm  with poor R wave progression rule out old anterior infarct pattern, ventricular trigeminy.  Recent Labwork: 02/18/2017: BUN 65; Creatinine, Ser 2.58; Potassium 4.0; Sodium 136  April 2019: BUN 50, creatinine 2.28, potassium 4.5  Other Studies Reviewed Today:  Echocardiogram 07/20/2016: Study Conclusions  - Procedure narrative: Transthoracic echocardiography. Image quality was fair. Contrast enhancement utilized. - Left ventricle: The cavity size was normal. There was moderate concentric hypertrophy. Systolic function was normal. The estimated ejection fraction was in the range of 50% to 55%. Doppler parameters are consistent with abnormal left ventricular relaxation (grade 1 diastolic dysfunction). Doppler parameters are consistent with high ventricular filling pressure. - Regional wall motion abnormality: Hypokinesis of the mid anterior, mid anteroseptal, and mid inferoseptal myocardium. - Aortic valve: Trileaflet; mildly calcified leaflets. - Mitral valve:  Calcified annulus. Mildly calcified leaflets . - Left atrium: The atrium was mildly dilated. - Right ventricle: Systolic function was mildly reduced.  Assessment and Plan:  1.  Chronic diastolic heart failure.  He has been clinically stable recently with reported stable weights at home.  Continue with current Demadex dose, he also has Zaroxolyn to use as needed for 2 to 3 days if he experiences weight gain of 2 to 3 pounds in 24 hours or 5 pounds in a week.  2.  CKD stage 3, last creatinine 2.28.  3.  CAD status post CABG with graft disease and subsequent PCI.  He reports no angina on medical therapy.  4.  Statin intolerance.  Current medicines were reviewed with the patient today.   Orders Placed This Encounter  Procedures  . Basic metabolic panel    Disposition: Follow-up in 3 months.  Signed, Satira Sark, MD, [facility] 07/30/2017 3:54 PM    [place] at [address] [place], [place], [place] [address] Phone: [phone]; Fax: [phone]

## 2017-07-30 ENCOUNTER — Ambulatory Visit: Payer: PPO | Admitting: Cardiology

## 2017-07-30 ENCOUNTER — Encounter: Payer: Self-pay | Admitting: Cardiology

## 2017-07-30 VITALS — BP 136/73 | HR 68 | Ht 65.0 in | Wt 273.0 lb

## 2017-07-30 DIAGNOSIS — Z789 Other specified health status: Secondary | ICD-10-CM | POA: Diagnosis not present

## 2017-07-30 DIAGNOSIS — N183 Chronic kidney disease, stage 3 unspecified: Secondary | ICD-10-CM

## 2017-07-30 DIAGNOSIS — I2510 Atherosclerotic heart disease of native coronary artery without angina pectoris: Secondary | ICD-10-CM

## 2017-07-30 DIAGNOSIS — I5032 Chronic diastolic (congestive) heart failure: Secondary | ICD-10-CM

## 2017-07-30 NOTE — Patient Instructions (Signed)
Medication Instructions:   Your physician recommends that you continue on your current medications as directed. Please refer to the Current Medication list given to you today.  Labwork:  Your physician recommends that you return for lab work in: 3 months just before your next visit to check your BMET.  Testing/Procedures:  NONE  Follow-Up:  Your physician recommends that you schedule a follow-up appointment in: 3 months.  Any Other Special Instructions Will Be Listed Below (If Applicable).  If you need a refill on your cardiac medications before your next appointment, please call your pharmacy.

## 2017-08-12 ENCOUNTER — Other Ambulatory Visit: Payer: Self-pay | Admitting: *Deleted

## 2017-08-12 MED ORDER — METOLAZONE 2.5 MG PO TABS: ORAL_TABLET | ORAL | 0 refills | Status: DC

## 2017-08-13 ENCOUNTER — Other Ambulatory Visit: Payer: Self-pay | Admitting: *Deleted

## 2017-08-13 MED ORDER — TORSEMIDE 20 MG PO TABS: ORAL_TABLET | ORAL | 6 refills | Status: DC

## 2017-08-19 DIAGNOSIS — G4733 Obstructive sleep apnea (adult) (pediatric): Secondary | ICD-10-CM | POA: Diagnosis not present

## 2017-08-19 DIAGNOSIS — R0902 Hypoxemia: Secondary | ICD-10-CM | POA: Diagnosis not present

## 2017-09-07 ENCOUNTER — Other Ambulatory Visit: Payer: Self-pay | Admitting: *Deleted

## 2017-09-07 MED ORDER — ISOSORBIDE MONONITRATE ER 30 MG PO TB24: 30.0000 mg | ORAL_TABLET | Freq: Every day | ORAL | 3 refills | Status: DC

## 2017-09-19 DIAGNOSIS — G4733 Obstructive sleep apnea (adult) (pediatric): Secondary | ICD-10-CM | POA: Diagnosis not present

## 2017-09-19 DIAGNOSIS — R0902 Hypoxemia: Secondary | ICD-10-CM | POA: Diagnosis not present

## 2017-10-05 ENCOUNTER — Other Ambulatory Visit: Payer: Self-pay | Admitting: *Deleted

## 2017-10-05 ENCOUNTER — Other Ambulatory Visit: Payer: Self-pay | Admitting: Adult Health

## 2017-10-05 MED ORDER — METOLAZONE 2.5 MG PO TABS: ORAL_TABLET | ORAL | 0 refills | Status: DC

## 2017-10-19 ENCOUNTER — Other Ambulatory Visit: Payer: Self-pay | Admitting: *Deleted

## 2017-10-19 DIAGNOSIS — G4733 Obstructive sleep apnea (adult) (pediatric): Secondary | ICD-10-CM | POA: Diagnosis not present

## 2017-10-19 DIAGNOSIS — R0902 Hypoxemia: Secondary | ICD-10-CM | POA: Diagnosis not present

## 2017-10-19 MED ORDER — CLOPIDOGREL BISULFATE 75 MG PO TABS: ORAL_TABLET | ORAL | 3 refills | Status: DC

## 2017-10-22 DIAGNOSIS — Z6841 Body Mass Index (BMI) 40.0 and over, adult: Secondary | ICD-10-CM | POA: Diagnosis not present

## 2017-10-22 DIAGNOSIS — E1165 Type 2 diabetes mellitus with hyperglycemia: Secondary | ICD-10-CM | POA: Diagnosis not present

## 2017-10-22 DIAGNOSIS — I10 Essential (primary) hypertension: Secondary | ICD-10-CM | POA: Diagnosis not present

## 2017-10-22 DIAGNOSIS — N183 Chronic kidney disease, stage 3 (moderate): Secondary | ICD-10-CM | POA: Diagnosis not present

## 2017-10-22 DIAGNOSIS — G4700 Insomnia, unspecified: Secondary | ICD-10-CM | POA: Diagnosis not present

## 2017-10-22 DIAGNOSIS — R609 Edema, unspecified: Secondary | ICD-10-CM | POA: Diagnosis not present

## 2017-10-22 DIAGNOSIS — I5032 Chronic diastolic (congestive) heart failure: Secondary | ICD-10-CM | POA: Diagnosis not present

## 2017-10-22 DIAGNOSIS — E782 Mixed hyperlipidemia: Secondary | ICD-10-CM | POA: Diagnosis not present

## 2017-11-01 DIAGNOSIS — I5032 Chronic diastolic (congestive) heart failure: Secondary | ICD-10-CM | POA: Diagnosis not present

## 2017-11-02 ENCOUNTER — Telehealth: Payer: Self-pay | Admitting: *Deleted

## 2017-11-02 NOTE — Telephone Encounter (Signed)
Patient informed and copy sent to PCP. 

## 2017-11-02 NOTE — Telephone Encounter (Signed)
-----   Message from Satira Sark, MD sent at 11/01/2017  1:29 PM EDT ----- Results reviewed.  Stable renal function with creatinine 2.3 and normal potassium.  Continue with current medical therapy and scheduled follow-up. A copy of this test should be forwarded to [name], MD.

## 2017-11-04 NOTE — Progress Notes (Signed)
Cardiology Office Note  Date: 11/08/2017   ID: [name], DOB [birth year], MRN [id number]  PCP: [name], MD  Primary Cardiologist: [name], MD   Chief Complaint  Patient presents with  . Diastolic heart failure    History of Present Illness: [name] is an 84 y.o. male last seen in April.  He is here today for a follow-up visit.  He tells me that over the last week he has felt nauseated and fatigue.  No fevers or chills, no abdominal pain, no reflux, no definite change in bowel pattern.  He states that he saw his PCP recently for a routine visit, prior to the onset of the symptoms however.  From a fluid weight perspective, he continues to use Demadex and intermittent metolazone, his weight is stable in comparison to April.  Recent follow-up lab work is outlined below.  I reviewed his medications.  Cardiac regimen includes aspirin, Plavix, Cardizem CD, Imdur, Lopressor, Demadex, and as needed metolazone.  Past Medical History:  Diagnosis Date  . Allergic rhinitis   . Anxiety   . Arthritis   . COPD (chronic obstructive pulmonary disease) ([place])   . Coronary atherosclerosis of native coronary artery    a. CABG x 4 in 1994. Previous stent placement to SVG to distal RCA;  b. DES to native Cx in 2006;  c. DES SVG to RCA 05/08/11; d. Inf MI/Cath/PCI: LM 25, LAD 100, LCX patent stent, 50 into OM, RI 60-70, RCA 100, LIMA->LAD nl, VG->Diag 100 old, VG->RCA/PL 99 (DES x 1), EF 50-55%.  . Depression    With anxious features/hx panic attacks  . Essential hypertension, benign   . GERD (gastroesophageal reflux disease)   . Hyperlipidemia    Not on statin 2/2 hx of intolerance.   . Morbid obesity ([place])   . Nephrolithiasis   . Pneumonia   . Sleep apnea   . ST elevation myocardial infarction (STEMI) of inferior wall ([place])    4/14 - DES SVG to PDA  . Type 2 diabetes mellitus ([place])     Past Surgical History:  Procedure Laterality Date  . CORONARY ARTERY  BYPASS GRAFT  1994  . EYE SURGERY    . LEFT HEART CATHETERIZATION WITH CORONARY ANGIOGRAM N/A 05/08/2011   Procedure: LEFT HEART CATHETERIZATION WITH CORONARY ANGIOGRAM;  Surgeon: [name], MD;  Location: [facility] CATH LAB;  Service: Cardiovascular;  Laterality: N/A;  . LEFT HEART CATHETERIZATION WITH CORONARY ANGIOGRAM N/A 08/08/2012   Procedure: LEFT HEART CATHETERIZATION WITH CORONARY ANGIOGRAM;  Surgeon: [name], MD;  Location: [facility] CATH LAB;  Service: Cardiovascular;  Laterality: N/A;  . PERCUTANEOUS CORONARY STENT INTERVENTION (PCI-S) Right 05/08/2011   Procedure: PERCUTANEOUS CORONARY STENT INTERVENTION (PCI-S);  Surgeon: [name], MD;  Location: [facility] CATH LAB;  Service: Cardiovascular;  Laterality: Right;  . PERCUTANEOUS CORONARY STENT INTERVENTION (PCI-S) N/A 08/08/2012   Procedure: PERCUTANEOUS CORONARY STENT INTERVENTION (PCI-S);  Surgeon: [name], MD;  Location: [facility] CATH LAB;  Service: Cardiovascular;  Laterality: N/A;    Current Outpatient Medications  Medication Sig Dispense Refill  . allopurinol (ZYLOPRIM) 100 MG tablet Take 1 tablet (100 mg total) by mouth daily. 30 tablet 2  . alprazolam (XANAX) 2 MG tablet Take 1 mg by mouth at bedtime.     Marland Kitchen aspirin EC 81 MG tablet Take 81 mg by mouth every morning.    . clopidogrel (PLAVIX) 75 MG tablet take 1 tablet by mouth once daily WITH BREAKFAST  90 tablet 3  . cyclobenzaprine (FLEXERIL) 10 MG tablet Take 10 mg by mouth at bedtime.     Marland Kitchen diltiazem (CARDIZEM CD) 240 MG 24 hr capsule Take 240 mg by mouth daily.    . isosorbide mononitrate (IMDUR) 30 MG 24 hr tablet Take 1 tablet (30 mg total) by mouth daily. 90 tablet 3  . metolazone (ZAROXOLYN) 2.5 MG tablet 01/29/16 change in directions Take one day for 2-3 days as needed only for weight gain of 3-5 lbs in a 48 hour period. 15 tablet 1  . metoprolol tartrate (LOPRESSOR) 25 MG tablet TAKE 1 TABLET BY MOUTH TWICE A DAY 180 tablet 0  . nitroGLYCERIN  (NITROLINGUAL) 0.4 MG/SPRAY spray use 1-2 sprays under the tongue if needed for chest pain *MAXIMUM OF 3 SPRAYS IN 15 MINUTES 25 g 3  . polyethylene glycol powder (GLYCOLAX/MIRALAX) powder Take 17 g by mouth daily as needed (constipation).     . psyllium (METAMUCIL) 58.6 % powder Take by mouth every morning.    . tamsulosin (FLOMAX) 0.4 MG CAPS capsule Take 0.4 mg by mouth every evening.     . torsemide (DEMADEX) 20 MG tablet Take Two Tablets ( 40 mg )  Daily Alternating with One Tablet (20 mg) Daily 45 tablet 6  . TOUJEO SOLOSTAR 300 UNIT/ML SOPN inject 60 units BID  0   No current facility-administered medications for this visit.    Allergies:  Zocor [simvastatin] and Lipitor [atorvastatin]   Social History: The patient  reports that he has never smoked. He has never used smokeless tobacco. He reports that he does not drink alcohol or use drugs.   ROS:  Please see the history of present illness. Otherwise, complete review of systems is positive for hearing loss.  All other systems are reviewed and negative.   Physical Exam: VS:  BP (!) 171/82   Pulse 81   Temp 98.8 F (37.1 C) Comment: oral  Ht 5\' 5"  (1.651 m)   Wt 273 lb 9.6 oz (124.1 kg)   SpO2 92% Comment: on room air  BMI 45.53 kg/m , BMI Body mass index is 45.53 kg/m.  Wt Readings from Last 3 Encounters:  11/08/17 273 lb 9.6 oz (124.1 kg)  07/30/17 273 lb (123.8 kg)  04/14/17 275 lb (124.7 kg)    General: Obese elderly male using a walker, appears comfortable at rest. HEENT: Conjunctiva and lids normal, oropharynx clear. Neck: Supple, no elevated JVP or carotid bruits, no thyromegaly. Lungs: Clear to auscultation, nonlabored breathing at rest. Cardiac: Regular rate and rhythm, no S3 or significant systolic murmur. Abdomen: Obese, nontender, bowel sounds present, no guarding or rebound. Extremities: Mild lower leg edema, distal pulses 2+. Skin: Warm and dry.  Musculoskeletal: No kyphosis. Neuropsychiatric: Alert and  oriented x3, affect grossly appropriate.  ECG: I personally reviewed the tracing from 02/18/2017 which showed sinus rhythm with poor R wave progression rule out old anterior infarct pattern, ventricular trigeminy.  Recent Labwork: 02/18/2017: BUN 65; Creatinine, Ser 2.58; Potassium 4.0; Sodium 136     Component Value Date/Time   CHOL 185 05/25/2014 0549   TRIG 92 05/25/2014 0549   HDL 44 05/25/2014 0549   CHOLHDL 4.2 05/25/2014 0549   VLDL 18 05/25/2014 0549   LDLCALC 123 (H) 05/25/2014 0549  April 2019: BUN 50, creatinine 2.28, potassium 4.15 October 2017: BUN 56, creatinine 2.36, potassium 4.3  Other Studies Reviewed Today:  Echocardiogram 07/20/2016: Study Conclusions  - Procedure narrative: Transthoracic echocardiography. Image quality was fair. Contrast  enhancement utilized. - Left ventricle: The cavity size was normal. There was moderate concentric hypertrophy. Systolic function was normal. The estimated ejection fraction was in the range of 50% to 55%. Doppler parameters are consistent with abnormal left ventricular relaxation (grade 1 diastolic dysfunction). Doppler parameters are consistent with high ventricular filling pressure. - Regional wall motion abnormality: Hypokinesis of the mid anterior, mid anteroseptal, and mid inferoseptal myocardium. - Aortic valve: Trileaflet; mildly calcified leaflets. - Mitral valve: Calcified annulus. Mildly calcified leaflets . - Left atrium: The atrium was mildly dilated. - Right ventricle: Systolic function was mildly reduced.  Assessment and Plan:  1.  Chronic diastolic heart failure.  Weight is stable in comparison to April on standing dose of Demadex with intermittent Zaroxolyn use.  I reviewed his recent lab work which shows stable renal insufficiency and creatinine of 2.3.  Continue with current plan.  2.  CKD stage III, creatinine 2.36.  Potassium normal.  3.  CAD status post CABG with documented graft disease and  subsequent PCI.  He does not report any active angina at this time and continues on aspirin, Plavix, beta-blocker, and Imdur.  4.  Recent reported nausea as discussed above.  I asked him to contact his PCP if symptoms persist.  5.  Statin intolerance.  Current medicines were reviewed with the patient today.  Disposition: Follow-up in 4 months.  Signed, Satira Sark, MD, [facility] 11/08/2017 2:52 PM    [facility] [place], [place], [place] [address] Phone: [phone]; Fax: [phone]

## 2017-11-05 ENCOUNTER — Other Ambulatory Visit: Payer: Self-pay | Admitting: *Deleted

## 2017-11-05 MED ORDER — METOLAZONE 2.5 MG PO TABS: ORAL_TABLET | ORAL | 1 refills | Status: DC

## 2017-11-08 ENCOUNTER — Ambulatory Visit: Payer: PPO | Admitting: Cardiology

## 2017-11-08 ENCOUNTER — Encounter: Payer: Self-pay | Admitting: Cardiology

## 2017-11-08 VITALS — BP 171/82 | HR 81 | Temp 98.8°F | Ht 65.0 in | Wt 273.6 lb

## 2017-11-08 DIAGNOSIS — I5032 Chronic diastolic (congestive) heart failure: Secondary | ICD-10-CM

## 2017-11-08 DIAGNOSIS — I2510 Atherosclerotic heart disease of native coronary artery without angina pectoris: Secondary | ICD-10-CM

## 2017-11-08 DIAGNOSIS — Z789 Other specified health status: Secondary | ICD-10-CM | POA: Diagnosis not present

## 2017-11-08 DIAGNOSIS — N183 Chronic kidney disease, stage 3 unspecified: Secondary | ICD-10-CM

## 2017-11-08 NOTE — Patient Instructions (Signed)
Medication Instructions:  Your physician recommends that you continue on your current medications as directed. Please refer to the Current Medication list given to you today.  Labwork: NONE  Testing/Procedures: NONE  Follow-Up: Your physician recommends that you schedule a follow-up appointment in: 4 MONTHS WITH DR. MCDOWELL  Any Other Special Instructions Will Be Listed Below (If Applicable).  If you need a refill on your cardiac medications before your next appointment, please call your pharmacy. 

## 2017-11-19 DIAGNOSIS — R0902 Hypoxemia: Secondary | ICD-10-CM | POA: Diagnosis not present

## 2017-11-19 DIAGNOSIS — G4733 Obstructive sleep apnea (adult) (pediatric): Secondary | ICD-10-CM | POA: Diagnosis not present

## 2017-12-07 DIAGNOSIS — R609 Edema, unspecified: Secondary | ICD-10-CM | POA: Diagnosis not present

## 2017-12-07 DIAGNOSIS — L0390 Cellulitis, unspecified: Secondary | ICD-10-CM | POA: Diagnosis not present

## 2017-12-07 DIAGNOSIS — Z6841 Body Mass Index (BMI) 40.0 and over, adult: Secondary | ICD-10-CM | POA: Diagnosis not present

## 2017-12-20 ENCOUNTER — Other Ambulatory Visit: Payer: Self-pay | Admitting: *Deleted

## 2017-12-20 DIAGNOSIS — G4733 Obstructive sleep apnea (adult) (pediatric): Secondary | ICD-10-CM | POA: Diagnosis not present

## 2017-12-20 DIAGNOSIS — R0902 Hypoxemia: Secondary | ICD-10-CM | POA: Diagnosis not present

## 2017-12-20 MED ORDER — NITROGLYCERIN 0.4 MG/SPRAY TL SOLN: 3 refills | Status: DC

## 2018-01-03 ENCOUNTER — Other Ambulatory Visit: Payer: Self-pay | Admitting: *Deleted

## 2018-01-03 ENCOUNTER — Other Ambulatory Visit: Payer: Self-pay | Admitting: Cardiology

## 2018-01-03 ENCOUNTER — Other Ambulatory Visit: Payer: Self-pay | Admitting: Adult Health

## 2018-01-03 MED ORDER — METOLAZONE 2.5 MG PO TABS: ORAL_TABLET | ORAL | 1 refills | Status: DC

## 2018-01-19 DIAGNOSIS — R0902 Hypoxemia: Secondary | ICD-10-CM | POA: Diagnosis not present

## 2018-01-19 DIAGNOSIS — G4733 Obstructive sleep apnea (adult) (pediatric): Secondary | ICD-10-CM | POA: Diagnosis not present

## 2018-02-02 ENCOUNTER — Other Ambulatory Visit: Payer: Self-pay | Admitting: Cardiology

## 2018-02-15 DIAGNOSIS — Z6841 Body Mass Index (BMI) 40.0 and over, adult: Secondary | ICD-10-CM | POA: Diagnosis not present

## 2018-02-15 DIAGNOSIS — Z0001 Encounter for general adult medical examination with abnormal findings: Secondary | ICD-10-CM | POA: Diagnosis not present

## 2018-02-19 DIAGNOSIS — G4733 Obstructive sleep apnea (adult) (pediatric): Secondary | ICD-10-CM | POA: Diagnosis not present

## 2018-02-19 DIAGNOSIS — R0902 Hypoxemia: Secondary | ICD-10-CM | POA: Diagnosis not present

## 2018-03-04 ENCOUNTER — Other Ambulatory Visit: Payer: Self-pay | Admitting: Cardiology

## 2018-03-07 NOTE — Progress Notes (Signed)
Cardiology Office Note  Date: 03/08/2018   ID: [name], DOB [birth year], MRN [id number]  PCP: [name], MD  Primary Cardiologist: [name], MD   Chief Complaint  Patient presents with  . Coronary Artery Disease  . Diastolic heart failure    History of Present Illness: [name] is an 84 y.o. male last seen in July 2019.  He is here for a routine visit.  Weight has been relatively stable, he has not had to use much extra Zaroxolyn on a regular basis.  His remaining cardiac regimen remains stable as outlined below.  He does report occasional angina symptoms and uses nitroglycerin spray.  Overall very sedentary however.  He uses a walker.  I personally reviewed his ECG today which shows sinus rhythm with frequent PACs and PVCs, low voltage, rightward axis, old inferior infarct pattern.  He continues to follow with Dr. Quintin Alto, states that he declined recent lab work, just "was not ready for it."  Past Medical History:  Diagnosis Date  . Allergic rhinitis   . Anxiety   . Arthritis   . COPD (chronic obstructive pulmonary disease) ([place])   . Coronary atherosclerosis of native coronary artery    a. CABG x 4 in 1994. Previous stent placement to SVG to distal RCA;  b. DES to native Cx in 2006;  c. DES SVG to RCA 05/08/11; d. Inf MI/Cath/PCI: LM 25, LAD 100, LCX patent stent, 50 into OM, RI 60-70, RCA 100, LIMA->LAD nl, VG->Diag 100 old, VG->RCA/PL 99 (DES x 1), EF 50-55%.  . Depression    With anxious features/hx panic attacks  . Essential hypertension, benign   . GERD (gastroesophageal reflux disease)   . Hyperlipidemia    Not on statin 2/2 hx of intolerance.   . Morbid obesity ([place])   . Nephrolithiasis   . Pneumonia   . Sleep apnea   . ST elevation myocardial infarction (STEMI) of inferior wall ([place])    4/14 - DES SVG to PDA  . Type 2 diabetes mellitus ([place])     Past Surgical History:  Procedure Laterality Date  . CORONARY ARTERY BYPASS GRAFT   1994  . EYE SURGERY    . LEFT HEART CATHETERIZATION WITH CORONARY ANGIOGRAM N/A 05/08/2011   Procedure: LEFT HEART CATHETERIZATION WITH CORONARY ANGIOGRAM;  Surgeon: [name], MD;  Location: [facility] CATH LAB;  Service: Cardiovascular;  Laterality: N/A;  . LEFT HEART CATHETERIZATION WITH CORONARY ANGIOGRAM N/A 08/08/2012   Procedure: LEFT HEART CATHETERIZATION WITH CORONARY ANGIOGRAM;  Surgeon: [name], MD;  Location: [facility] CATH LAB;  Service: Cardiovascular;  Laterality: N/A;  . PERCUTANEOUS CORONARY STENT INTERVENTION (PCI-S) Right 05/08/2011   Procedure: PERCUTANEOUS CORONARY STENT INTERVENTION (PCI-S);  Surgeon: [name], MD;  Location: [facility] CATH LAB;  Service: Cardiovascular;  Laterality: Right;  . PERCUTANEOUS CORONARY STENT INTERVENTION (PCI-S) N/A 08/08/2012   Procedure: PERCUTANEOUS CORONARY STENT INTERVENTION (PCI-S);  Surgeon: [name], MD;  Location: [facility] CATH LAB;  Service: Cardiovascular;  Laterality: N/A;    Current Outpatient Medications  Medication Sig Dispense Refill  . allopurinol (ZYLOPRIM) 100 MG tablet Take 1 tablet (100 mg total) by mouth daily. 30 tablet 2  . alprazolam (XANAX) 2 MG tablet Take 1 mg by mouth at bedtime.     Marland Kitchen aspirin EC 81 MG tablet Take 81 mg by mouth every morning.    . clopidogrel (PLAVIX) 75 MG tablet take 1 tablet by mouth once daily WITH BREAKFAST 90 tablet  3  . cyclobenzaprine (FLEXERIL) 10 MG tablet Take 10 mg by mouth at bedtime.     Marland Kitchen diltiazem (CARDIZEM CD) 240 MG 24 hr capsule Take 240 mg by mouth daily.    . isosorbide mononitrate (IMDUR) 30 MG 24 hr tablet Take 1 tablet (30 mg total) by mouth daily. 90 tablet 3  . metolazone (ZAROXOLYN) 2.5 MG tablet TAKE 1 TABLET ONCE DAILY FOR 2 TO 3 DAYS AS NEEDED ONLY FOR WEIGHT GAIN OF 3 TO 5 LBS IN A 48 HR PERIOD 15 tablet 0  . metoprolol tartrate (LOPRESSOR) 25 MG tablet TAKE 1 TABLET BY MOUTH TWICE A DAY 180 tablet 1  . nitroGLYCERIN (NITROLINGUAL) 0.4 MG/SPRAY spray use  1-2 sprays under the tongue if needed for chest pain *MAXIMUM OF 3 SPRAYS IN 15 MINUTES 25 g 3  . polyethylene glycol powder (GLYCOLAX/MIRALAX) powder Take 17 g by mouth daily as needed (constipation).     . psyllium (METAMUCIL) 58.6 % powder Take by mouth every morning.    . tamsulosin (FLOMAX) 0.4 MG CAPS capsule Take 0.4 mg by mouth every evening.     . torsemide (DEMADEX) 20 MG tablet Take Two Tablets ( 40 mg )  Daily Alternating with One Tablet (20 mg) Daily 45 tablet 6  . TOUJEO SOLOSTAR 300 UNIT/ML SOPN inject 60 units BID  0   No current facility-administered medications for this visit.    Allergies:  Zocor [simvastatin] and Lipitor [atorvastatin]   Social History: The patient  reports that he has never smoked. He has never used smokeless tobacco. He reports that he does not drink alcohol or use drugs.   ROS:  Please see the history of present illness. Otherwise, complete review of systems is positive for hearing loss.  All other systems are reviewed and negative.   Physical Exam: VS:  BP 138/70   Pulse 80   Ht 5\' 5"  (1.651 m)   Wt 277 lb (125.6 kg)   SpO2 93%   BMI 46.10 kg/m , BMI Body mass index is 46.1 kg/m.  Wt Readings from Last 3 Encounters:  03/08/18 277 lb (125.6 kg)  11/08/17 273 lb 9.6 oz (124.1 kg)  07/30/17 273 lb (123.8 kg)    General: Obese elderly male, using a walker, appears comfortable at rest. HEENT: Conjunctiva and lids normal, oropharynx clear. Neck: Supple, no elevated JVP or carotid bruits, no thyromegaly. Lungs: Clear to auscultation, nonlabored breathing at rest. Cardiac: Regular rate and rhythm with ectopy, no S3 or significant systolic murmur. Abdomen: Obese,, nontender, bowel sounds present. Extremities: Mild lower leg edema, distal pulses 2+. Skin: Warm and dry. Musculoskeletal: No kyphosis. Neuropsychiatric: Alert and oriented x3, affect grossly appropriate.  ECG: I personally reviewed the tracing from 02/18/2017 which showed sinus rhythm  with poor R wave progression, rule out old anterior infarct pattern, ventricular bigeminy.  Recent Labwork:  April 2019: BUN 50, creatinine 2.28, potassium 4.15 October 2017: BUN 56, creatinine 2.36, potassium 4.3  Other Studies Reviewed Today:  Echocardiogram 07/20/2016: Study Conclusions  - Procedure narrative: Transthoracic echocardiography. Image quality was fair. Contrast enhancement utilized. - Left ventricle: The cavity size was normal. There was moderate concentric hypertrophy. Systolic function was normal. The estimated ejection fraction was in the range of 50% to 55%. Doppler parameters are consistent with abnormal left ventricular relaxation (grade 1 diastolic dysfunction). Doppler parameters are consistent with high ventricular filling pressure. - Regional wall motion abnormality: Hypokinesis of the mid anterior, mid anteroseptal, and mid inferoseptal myocardium. - Aortic  valve: Trileaflet; mildly calcified leaflets. - Mitral valve: Calcified annulus. Mildly calcified leaflets . - Left atrium: The atrium was mildly dilated. - Right ventricle: Systolic function was mildly reduced.  Assessment and Plan:  1.  Chronic diastolic heart failure.  He has done relatively well in terms of stable weight on present diuretic regimen including Demadex with as needed Zaroxolyn.  He does have renal insufficiency, last creatinine was 2.3.  Continue with current medication.  2.  CKD stage 3, creatinine 2.3-2.4.  Keep follow-up with Dr. Quintin Alto.  3.  CAD status post CABG with graft disease and subsequent PCI.  He has intermittent angina and uses nitroglycerin spray.  No progressive symptoms.  Continues on dual antiplatelet therapy, beta-blocker, and long-acting nitrates.  4.  Statin intolerance.  Current medicines were reviewed with the patient today.   Orders Placed This Encounter  Procedures  . EKG 12-Lead    Disposition: Follow-up in 6 months.  Signed, Satira Sark, MD, [facility] 03/08/2018 1:16 PM    [facility] [place], [place], [place] [address] Phone: [phone]; Fax: [phone]

## 2018-03-08 ENCOUNTER — Ambulatory Visit: Payer: PPO | Admitting: Cardiology

## 2018-03-08 ENCOUNTER — Encounter: Payer: Self-pay | Admitting: Cardiology

## 2018-03-08 VITALS — BP 138/70 | HR 80 | Ht 65.0 in | Wt 277.0 lb

## 2018-03-08 DIAGNOSIS — I25119 Atherosclerotic heart disease of native coronary artery with unspecified angina pectoris: Secondary | ICD-10-CM | POA: Diagnosis not present

## 2018-03-08 DIAGNOSIS — Z789 Other specified health status: Secondary | ICD-10-CM | POA: Diagnosis not present

## 2018-03-08 DIAGNOSIS — I5032 Chronic diastolic (congestive) heart failure: Secondary | ICD-10-CM

## 2018-03-08 DIAGNOSIS — N183 Chronic kidney disease, stage 3 unspecified: Secondary | ICD-10-CM

## 2018-03-08 NOTE — Patient Instructions (Addendum)

## 2018-03-21 DIAGNOSIS — R0902 Hypoxemia: Secondary | ICD-10-CM | POA: Diagnosis not present

## 2018-03-21 DIAGNOSIS — G4733 Obstructive sleep apnea (adult) (pediatric): Secondary | ICD-10-CM | POA: Diagnosis not present

## 2018-03-31 ENCOUNTER — Other Ambulatory Visit: Payer: Self-pay | Admitting: *Deleted

## 2018-03-31 MED ORDER — TORSEMIDE 20 MG PO TABS: ORAL_TABLET | ORAL | 6 refills | Status: DC

## 2018-04-03 ENCOUNTER — Other Ambulatory Visit: Payer: Self-pay | Admitting: Cardiology

## 2018-04-12 DIAGNOSIS — G510 Bell's palsy: Secondary | ICD-10-CM | POA: Diagnosis not present

## 2018-04-12 DIAGNOSIS — Z6841 Body Mass Index (BMI) 40.0 and over, adult: Secondary | ICD-10-CM | POA: Diagnosis not present

## 2018-04-21 DIAGNOSIS — R0902 Hypoxemia: Secondary | ICD-10-CM | POA: Diagnosis not present

## 2018-04-21 DIAGNOSIS — G4733 Obstructive sleep apnea (adult) (pediatric): Secondary | ICD-10-CM | POA: Diagnosis not present

## 2018-05-22 DIAGNOSIS — G4733 Obstructive sleep apnea (adult) (pediatric): Secondary | ICD-10-CM | POA: Diagnosis not present

## 2018-05-22 DIAGNOSIS — R0902 Hypoxemia: Secondary | ICD-10-CM | POA: Diagnosis not present

## 2018-06-20 DIAGNOSIS — R0902 Hypoxemia: Secondary | ICD-10-CM | POA: Diagnosis not present

## 2018-06-20 DIAGNOSIS — G4733 Obstructive sleep apnea (adult) (pediatric): Secondary | ICD-10-CM | POA: Diagnosis not present

## 2018-07-02 ENCOUNTER — Other Ambulatory Visit: Payer: Self-pay | Admitting: Adult Health

## 2018-07-21 ENCOUNTER — Inpatient Hospital Stay (HOSPITAL_COMMUNITY)
Admission: EM | Admit: 2018-07-21 | Discharge: 2018-07-26 | DRG: 302 | Disposition: A | Discharge status: Skilled Nursing Facility | Payer: PPO | Attending: Family Medicine | Admitting: Family Medicine

## 2018-07-21 ENCOUNTER — Emergency Department (HOSPITAL_COMMUNITY): Payer: PPO

## 2018-07-21 ENCOUNTER — Telehealth: Payer: Self-pay | Admitting: Student

## 2018-07-21 ENCOUNTER — Observation Stay (HOSPITAL_BASED_OUTPATIENT_CLINIC_OR_DEPARTMENT_OTHER): Payer: PPO

## 2018-07-21 ENCOUNTER — Encounter (HOSPITAL_COMMUNITY): Payer: Self-pay

## 2018-07-21 ENCOUNTER — Other Ambulatory Visit: Payer: Self-pay

## 2018-07-21 DIAGNOSIS — R7989 Other specified abnormal findings of blood chemistry: Secondary | ICD-10-CM

## 2018-07-21 DIAGNOSIS — M1990 Unspecified osteoarthritis, unspecified site: Secondary | ICD-10-CM | POA: Diagnosis not present

## 2018-07-21 DIAGNOSIS — R079 Chest pain, unspecified: Secondary | ICD-10-CM

## 2018-07-21 DIAGNOSIS — E1169 Type 2 diabetes mellitus with other specified complication: Secondary | ICD-10-CM | POA: Diagnosis not present

## 2018-07-21 DIAGNOSIS — F325 Major depressive disorder, single episode, in full remission: Secondary | ICD-10-CM | POA: Diagnosis not present

## 2018-07-21 DIAGNOSIS — I358 Other nonrheumatic aortic valve disorders: Secondary | ICD-10-CM | POA: Diagnosis not present

## 2018-07-21 DIAGNOSIS — F410 Panic disorder [episodic paroxysmal anxiety] without agoraphobia: Secondary | ICD-10-CM | POA: Diagnosis not present

## 2018-07-21 DIAGNOSIS — I5043 Acute on chronic combined systolic (congestive) and diastolic (congestive) heart failure: Secondary | ICD-10-CM | POA: Diagnosis present

## 2018-07-21 DIAGNOSIS — I5032 Chronic diastolic (congestive) heart failure: Secondary | ICD-10-CM | POA: Diagnosis not present

## 2018-07-21 DIAGNOSIS — I252 Old myocardial infarction: Secondary | ICD-10-CM

## 2018-07-21 DIAGNOSIS — I452 Bifascicular block: Secondary | ICD-10-CM | POA: Diagnosis present

## 2018-07-21 DIAGNOSIS — I5033 Acute on chronic diastolic (congestive) heart failure: Secondary | ICD-10-CM

## 2018-07-21 DIAGNOSIS — I248 Other forms of acute ischemic heart disease: Secondary | ICD-10-CM

## 2018-07-21 DIAGNOSIS — I200 Unstable angina: Secondary | ICD-10-CM | POA: Diagnosis not present

## 2018-07-21 DIAGNOSIS — I2699 Other pulmonary embolism without acute cor pulmonale: Secondary | ICD-10-CM | POA: Diagnosis not present

## 2018-07-21 DIAGNOSIS — I429 Cardiomyopathy, unspecified: Secondary | ICD-10-CM | POA: Diagnosis not present

## 2018-07-21 DIAGNOSIS — M109 Gout, unspecified: Secondary | ICD-10-CM | POA: Diagnosis not present

## 2018-07-21 DIAGNOSIS — E785 Hyperlipidemia, unspecified: Secondary | ICD-10-CM | POA: Diagnosis present

## 2018-07-21 DIAGNOSIS — J309 Allergic rhinitis, unspecified: Secondary | ICD-10-CM | POA: Diagnosis present

## 2018-07-21 DIAGNOSIS — J449 Chronic obstructive pulmonary disease, unspecified: Secondary | ICD-10-CM | POA: Diagnosis present

## 2018-07-21 DIAGNOSIS — N400 Enlarged prostate without lower urinary tract symptoms: Secondary | ICD-10-CM | POA: Diagnosis not present

## 2018-07-21 DIAGNOSIS — G4733 Obstructive sleep apnea (adult) (pediatric): Secondary | ICD-10-CM | POA: Diagnosis not present

## 2018-07-21 DIAGNOSIS — I25110 Atherosclerotic heart disease of native coronary artery with unstable angina pectoris: Secondary | ICD-10-CM | POA: Diagnosis not present

## 2018-07-21 DIAGNOSIS — F411 Generalized anxiety disorder: Secondary | ICD-10-CM | POA: Diagnosis not present

## 2018-07-21 DIAGNOSIS — I10 Essential (primary) hypertension: Secondary | ICD-10-CM

## 2018-07-21 DIAGNOSIS — Z955 Presence of coronary angioplasty implant and graft: Secondary | ICD-10-CM

## 2018-07-21 DIAGNOSIS — R778 Other specified abnormalities of plasma proteins: Secondary | ICD-10-CM | POA: Diagnosis present

## 2018-07-21 DIAGNOSIS — J441 Chronic obstructive pulmonary disease with (acute) exacerbation: Secondary | ICD-10-CM | POA: Diagnosis not present

## 2018-07-21 DIAGNOSIS — I509 Heart failure, unspecified: Secondary | ICD-10-CM | POA: Diagnosis not present

## 2018-07-21 DIAGNOSIS — Z66 Do not resuscitate: Secondary | ICD-10-CM | POA: Diagnosis not present

## 2018-07-21 DIAGNOSIS — Z8249 Family history of ischemic heart disease and other diseases of the circulatory system: Secondary | ICD-10-CM

## 2018-07-21 DIAGNOSIS — N183 Chronic kidney disease, stage 3 unspecified: Secondary | ICD-10-CM | POA: Diagnosis present

## 2018-07-21 DIAGNOSIS — R531 Weakness: Secondary | ICD-10-CM | POA: Diagnosis not present

## 2018-07-21 DIAGNOSIS — J45901 Unspecified asthma with (acute) exacerbation: Secondary | ICD-10-CM | POA: Diagnosis not present

## 2018-07-21 DIAGNOSIS — R0902 Hypoxemia: Secondary | ICD-10-CM | POA: Diagnosis not present

## 2018-07-21 DIAGNOSIS — Z888 Allergy status to other drugs, medicaments and biological substances status: Secondary | ICD-10-CM

## 2018-07-21 DIAGNOSIS — E1122 Type 2 diabetes mellitus with diabetic chronic kidney disease: Secondary | ICD-10-CM

## 2018-07-21 DIAGNOSIS — Z951 Presence of aortocoronary bypass graft: Secondary | ICD-10-CM

## 2018-07-21 DIAGNOSIS — Z7982 Long term (current) use of aspirin: Secondary | ICD-10-CM

## 2018-07-21 DIAGNOSIS — G4730 Sleep apnea, unspecified: Secondary | ICD-10-CM | POA: Diagnosis not present

## 2018-07-21 DIAGNOSIS — Z741 Need for assistance with personal care: Secondary | ICD-10-CM | POA: Diagnosis not present

## 2018-07-21 DIAGNOSIS — E782 Mixed hyperlipidemia: Secondary | ICD-10-CM | POA: Diagnosis not present

## 2018-07-21 DIAGNOSIS — E1129 Type 2 diabetes mellitus with other diabetic kidney complication: Secondary | ICD-10-CM | POA: Diagnosis not present

## 2018-07-21 DIAGNOSIS — F329 Major depressive disorder, single episode, unspecified: Secondary | ICD-10-CM | POA: Diagnosis not present

## 2018-07-21 DIAGNOSIS — R279 Unspecified lack of coordination: Secondary | ICD-10-CM | POA: Diagnosis not present

## 2018-07-21 DIAGNOSIS — M1000 Idiopathic gout, unspecified site: Secondary | ICD-10-CM | POA: Diagnosis not present

## 2018-07-21 DIAGNOSIS — Z794 Long term (current) use of insulin: Secondary | ICD-10-CM

## 2018-07-21 DIAGNOSIS — R41841 Cognitive communication deficit: Secondary | ICD-10-CM | POA: Diagnosis not present

## 2018-07-21 DIAGNOSIS — Z7902 Long term (current) use of antithrombotics/antiplatelets: Secondary | ICD-10-CM

## 2018-07-21 DIAGNOSIS — R0789 Other chest pain: Secondary | ICD-10-CM | POA: Diagnosis not present

## 2018-07-21 DIAGNOSIS — K219 Gastro-esophageal reflux disease without esophagitis: Secondary | ICD-10-CM | POA: Diagnosis not present

## 2018-07-21 DIAGNOSIS — Z6841 Body Mass Index (BMI) 40.0 and over, adult: Secondary | ICD-10-CM | POA: Diagnosis not present

## 2018-07-21 DIAGNOSIS — I110 Hypertensive heart disease with heart failure: Secondary | ICD-10-CM | POA: Diagnosis not present

## 2018-07-21 DIAGNOSIS — E1159 Type 2 diabetes mellitus with other circulatory complications: Secondary | ICD-10-CM | POA: Diagnosis present

## 2018-07-21 DIAGNOSIS — R0602 Shortness of breath: Secondary | ICD-10-CM | POA: Diagnosis not present

## 2018-07-21 DIAGNOSIS — Z79899 Other long term (current) drug therapy: Secondary | ICD-10-CM

## 2018-07-21 DIAGNOSIS — M6281 Muscle weakness (generalized): Secondary | ICD-10-CM | POA: Diagnosis not present

## 2018-07-21 DIAGNOSIS — I130 Hypertensive heart and chronic kidney disease with heart failure and stage 1 through stage 4 chronic kidney disease, or unspecified chronic kidney disease: Secondary | ICD-10-CM | POA: Diagnosis not present

## 2018-07-21 LAB — CBC WITH DIFFERENTIAL/PLATELET
Abs Immature Granulocytes: 0.05 10*3/uL (ref 0.00–0.07)
Basophils Absolute: 0 10*3/uL (ref 0.0–0.1)
Basophils Relative: 0 %
Eosinophils Absolute: 0 10*3/uL (ref 0.0–0.5)
Eosinophils Relative: 1 %
HCT: 40.5 % (ref 39.0–52.0)
Hemoglobin: 13 g/dL (ref 13.0–17.0)
Immature Granulocytes: 1 %
Lymphocytes Relative: 14 %
Lymphs Abs: 1.3 10*3/uL (ref 0.7–4.0)
MCH: 29.4 pg (ref 26.0–34.0)
MCHC: 32.1 g/dL (ref 30.0–36.0)
MCV: 91.6 fL (ref 80.0–100.0)
Monocytes Absolute: 1.2 10*3/uL — ABNORMAL HIGH (ref 0.1–1.0)
Monocytes Relative: 14 %
Neutro Abs: 6.2 10*3/uL (ref 1.7–7.7)
Neutrophils Relative %: 70 %
Platelets: 155 10*3/uL (ref 150–400)
RBC: 4.42 MIL/uL (ref 4.22–5.81)
RDW: 13.1 % (ref 11.5–15.5)
WBC: 8.8 10*3/uL (ref 4.0–10.5)
nRBC: 0 % (ref 0.0–0.2)

## 2018-07-21 LAB — HEPARIN LEVEL (UNFRACTIONATED): Heparin Unfractionated: 0.19 IU/mL — ABNORMAL LOW (ref 0.30–0.70)

## 2018-07-21 LAB — BASIC METABOLIC PANEL
Anion gap: 13 (ref 5–15)
BUN: 59 mg/dL — ABNORMAL HIGH (ref 8–23)
CO2: 29 mmol/L (ref 22–32)
Calcium: 9.4 mg/dL (ref 8.9–10.3)
Chloride: 92 mmol/L — ABNORMAL LOW (ref 98–111)
Creatinine, Ser: 2.31 mg/dL — ABNORMAL HIGH (ref 0.61–1.24)
GFR calc Af Amer: 29 mL/min — ABNORMAL LOW (ref >60)
GFR calc non Af Amer: 25 mL/min — ABNORMAL LOW (ref >60)
Glucose, Bld: 341 mg/dL — ABNORMAL HIGH (ref 70–99)
Potassium: 4.1 mmol/L (ref 3.5–5.1)
Sodium: 134 mmol/L — ABNORMAL LOW (ref 135–145)

## 2018-07-21 LAB — BRAIN NATRIURETIC PEPTIDE: B Natriuretic Peptide: 126 pg/mL — ABNORMAL HIGH (ref 0.0–100.0)

## 2018-07-21 LAB — ECHOCARDIOGRAM LIMITED
Height: 65 in
Weight: 4400 oz

## 2018-07-21 LAB — HEMOGLOBIN A1C
Hgb A1c MFr Bld: 11.5 % — ABNORMAL HIGH (ref 4.8–5.6)
Mean Plasma Glucose: 283.35 mg/dL

## 2018-07-21 LAB — PROTIME-INR
INR: 1.1 (ref 0.8–1.2)
Prothrombin Time: 14 seconds (ref 11.4–15.2)

## 2018-07-21 LAB — TROPONIN I
Troponin I: 0.13 ng/mL (ref <0.03)
Troponin I: 0.42 ng/mL (ref <0.03)
Troponin I: 0.77 ng/mL (ref <0.03)
Troponin I: 0.91 ng/mL (ref <0.03)

## 2018-07-21 LAB — GLUCOSE, CAPILLARY
Glucose-Capillary: 259 mg/dL — ABNORMAL HIGH (ref 70–99)
Glucose-Capillary: 267 mg/dL — ABNORMAL HIGH (ref 70–99)

## 2018-07-21 MED ORDER — NITROGLYCERIN 0.4 MG SL SUBL
0.4000 mg | SUBLINGUAL_TABLET | SUBLINGUAL | Status: AC | PRN
  Administered 2018-07-22 (×4): 0.4 mg via SUBLINGUAL
  Filled 2018-07-21 (×3): qty 1

## 2018-07-21 MED ORDER — INSULIN ASPART 100 UNIT/ML ~~LOC~~ SOLN
0.0000 [IU] | Freq: Three times a day (TID) | SUBCUTANEOUS | Status: DC
  Administered 2018-07-21: 18:00:00 8 [IU] via SUBCUTANEOUS
  Administered 2018-07-22: 09:00:00 3 [IU] via SUBCUTANEOUS
  Administered 2018-07-22: 13:00:00 5 [IU] via SUBCUTANEOUS
  Administered 2018-07-22: 8 [IU] via SUBCUTANEOUS
  Administered 2018-07-23: 09:00:00 3 [IU] via SUBCUTANEOUS
  Administered 2018-07-23: 8 [IU] via SUBCUTANEOUS
  Administered 2018-07-23: 12:00:00 11 [IU] via SUBCUTANEOUS
  Administered 2018-07-24 (×2): 5 [IU] via SUBCUTANEOUS

## 2018-07-21 MED ORDER — TAMSULOSIN HCL 0.4 MG PO CAPS
0.4000 mg | ORAL_CAPSULE | Freq: Every evening | ORAL | Status: DC
  Administered 2018-07-21 – 2018-07-25 (×5): 0.4 mg via ORAL
  Filled 2018-07-21 (×5): qty 1

## 2018-07-21 MED ORDER — INSULIN GLARGINE 100 UNIT/ML ~~LOC~~ SOLN
60.0000 [IU] | Freq: Two times a day (BID) | SUBCUTANEOUS | Status: DC
  Administered 2018-07-21 – 2018-07-25 (×8): 60 [IU] via SUBCUTANEOUS
  Filled 2018-07-21 (×10): qty 0.6

## 2018-07-21 MED ORDER — PERFLUTREN LIPID MICROSPHERE
1.0000 mL | INTRAVENOUS | Status: AC | PRN
  Administered 2018-07-21: 16:00:00 2 mL via INTRAVENOUS
  Filled 2018-07-21: qty 10

## 2018-07-21 MED ORDER — FUROSEMIDE 10 MG/ML IJ SOLN
40.0000 mg | Freq: Two times a day (BID) | INTRAMUSCULAR | Status: DC
  Administered 2018-07-21 – 2018-07-24 (×6): 40 mg via INTRAVENOUS
  Filled 2018-07-21 (×6): qty 4

## 2018-07-21 MED ORDER — ASPIRIN EC 81 MG PO TBEC
81.0000 mg | DELAYED_RELEASE_TABLET | Freq: Every morning | ORAL | Status: DC
  Administered 2018-07-21 – 2018-07-26 (×6): 81 mg via ORAL
  Filled 2018-07-21 (×6): qty 1

## 2018-07-21 MED ORDER — MORPHINE SULFATE (PF) 4 MG/ML IV SOLN
4.0000 mg | INTRAVENOUS | Status: DC | PRN
  Administered 2018-07-21: 10:00:00 4 mg via INTRAVENOUS
  Filled 2018-07-21: qty 1

## 2018-07-21 MED ORDER — ALPRAZOLAM 1 MG PO TABS
1.0000 mg | ORAL_TABLET | Freq: Every day | ORAL | Status: DC
  Administered 2018-07-21 – 2018-07-22 (×2): 1 mg via ORAL
  Filled 2018-07-21 (×2): qty 1

## 2018-07-21 MED ORDER — ACETAMINOPHEN 325 MG PO TABS: 650.0000 mg | ORAL_TABLET | ORAL | Status: DC | PRN

## 2018-07-21 MED ORDER — INSULIN ASPART 100 UNIT/ML ~~LOC~~ SOLN
0.0000 [IU] | Freq: Every day | SUBCUTANEOUS | Status: DC
  Administered 2018-07-21 – 2018-07-23 (×3): 3 [IU] via SUBCUTANEOUS

## 2018-07-21 MED ORDER — NITROGLYCERIN 2 % TD OINT: 1.0000 [in_us] | TOPICAL_OINTMENT | Freq: Four times a day (QID) | TRANSDERMAL | Status: DC

## 2018-07-21 MED ORDER — FUROSEMIDE 10 MG/ML IJ SOLN
40.0000 mg | Freq: Once | INTRAMUSCULAR | Status: AC
  Administered 2018-07-21: 40 mg via INTRAVENOUS
  Filled 2018-07-21: qty 4

## 2018-07-21 MED ORDER — POLYETHYLENE GLYCOL 3350 17 G PO PACK: 17.0000 g | PACK | Freq: Every day | ORAL | Status: DC | PRN

## 2018-07-21 MED ORDER — DILTIAZEM HCL ER COATED BEADS 240 MG PO CP24
240.0000 mg | ORAL_CAPSULE | Freq: Every day | ORAL | Status: DC
  Administered 2018-07-21 – 2018-07-22 (×2): 240 mg via ORAL
  Filled 2018-07-21 (×2): qty 1

## 2018-07-21 MED ORDER — METOPROLOL TARTRATE 25 MG PO TABS
37.5000 mg | ORAL_TABLET | Freq: Two times a day (BID) | ORAL | Status: DC
  Administered 2018-07-21 – 2018-07-22 (×2): 37.5 mg via ORAL
  Filled 2018-07-21 (×2): qty 2

## 2018-07-21 MED ORDER — HEPARIN (PORCINE) 25000 UT/250ML-% IV SOLN
1450.0000 [IU]/h | INTRAVENOUS | Status: AC
  Administered 2018-07-21: 1000 [IU]/h via INTRAVENOUS
  Administered 2018-07-23 (×2): 1450 [IU]/h via INTRAVENOUS
  Filled 2018-07-21 (×4): qty 250

## 2018-07-21 MED ORDER — ALLOPURINOL 100 MG PO TABS
100.0000 mg | ORAL_TABLET | Freq: Every day | ORAL | Status: DC
  Administered 2018-07-21 – 2018-07-26 (×6): 100 mg via ORAL
  Filled 2018-07-21 (×7): qty 1

## 2018-07-21 MED ORDER — ONDANSETRON HCL 4 MG/2ML IJ SOLN: 4.0000 mg | Freq: Four times a day (QID) | INTRAMUSCULAR | Status: DC | PRN

## 2018-07-21 MED ORDER — HEPARIN BOLUS VIA INFUSION
4000.0000 [IU] | Freq: Once | INTRAVENOUS | Status: AC
  Administered 2018-07-21: 12:00:00 4000 [IU] via INTRAVENOUS

## 2018-07-21 MED ORDER — ISOSORBIDE MONONITRATE ER 60 MG PO TB24
30.0000 mg | ORAL_TABLET | Freq: Two times a day (BID) | ORAL | Status: DC
  Administered 2018-07-21 – 2018-07-26 (×10): 30 mg via ORAL
  Filled 2018-07-21 (×10): qty 1

## 2018-07-21 MED ORDER — INSULIN GLARGINE 100 UNIT/ML ~~LOC~~ SOLN
60.0000 [IU] | Freq: Two times a day (BID) | SUBCUTANEOUS | Status: DC
  Filled 2018-07-21 (×3): qty 0.6

## 2018-07-21 MED ORDER — CLOPIDOGREL BISULFATE 75 MG PO TABS
75.0000 mg | ORAL_TABLET | Freq: Every day | ORAL | Status: DC
  Administered 2018-07-21 – 2018-07-26 (×6): 75 mg via ORAL
  Filled 2018-07-21 (×6): qty 1

## 2018-07-21 MED ORDER — MORPHINE SULFATE (PF) 2 MG/ML IV SOLN
2.0000 mg | INTRAVENOUS | Status: DC | PRN
  Administered 2018-07-21 – 2018-07-23 (×10): 2 mg via INTRAVENOUS
  Filled 2018-07-21 (×10): qty 1

## 2018-07-21 MED ORDER — METOPROLOL TARTRATE 25 MG PO TABS
25.0000 mg | ORAL_TABLET | Freq: Two times a day (BID) | ORAL | Status: DC
  Administered 2018-07-21: 25 mg via ORAL
  Filled 2018-07-21: qty 1

## 2018-07-21 MED ORDER — ASPIRIN 81 MG PO CHEW
324.0000 mg | CHEWABLE_TABLET | Freq: Once | ORAL | Status: AC
  Administered 2018-07-21: 09:00:00 324 mg via ORAL
  Filled 2018-07-21: qty 4

## 2018-07-21 MED ORDER — ISOSORBIDE MONONITRATE ER 60 MG PO TB24
30.0000 mg | ORAL_TABLET | Freq: Every day | ORAL | Status: DC
  Administered 2018-07-21: 16:00:00 30 mg via ORAL
  Filled 2018-07-21: qty 1

## 2018-07-21 NOTE — Progress Notes (Signed)
*  PRELIMINARY RESULTS* Echocardiogram 2D Echocardiogram LIMITED WITH DEFINITY has been performed.  Leavy Cella 07/21/2018, 4:44 PM

## 2018-07-21 NOTE — Consult Note (Signed)
Cardiology Consultation:   Patient ID: [name]; [id number]; [birth year]   Admit date: 07/21/2018 Date of Consult: 07/21/2018  Primary Care Provider: [name], MD Primary Cardiologist: Satira Sark, MD   Patient Profile:   [name] is an 85 y.o. male with a history of CAD status post CABG and PCI, chronic diastolic heart failure, hypertension, type 2 diabetes mellitus, COPD, and CKD stage III who is being seen today for the evaluation of chest discomfort at the request of Dr. Roderic Palau.  History of Present Illness:   Mr. Sugg presented to the [facility] today with chest discomfort that began yesterday evening at rest.  He describes a tightness in his chest, mild breathlessness, did not improve with nitroglycerin spray at home.  He was not able to sleep overnight.  He states that he has been taking his medications regularly but does feel like he has "a little extra fluid."  He could not tell me what his recent weights have been at home, but there does not look to be any substantial change at least based on comparison to his prior weight in November 2019.  He was treated with Lasix and morphine in the ER, on my interview stated that his chest discomfort had resolved.  Was of increased from 0.13-0.42, BNP 126.  Current creatinine 2.31.  Chest x-ray shows low lung volumes with bibasilar opacities.  He is afebrile and denies any recent cough.  Past Medical History:  Diagnosis Date  . Allergic rhinitis   . Anxiety   . Arthritis   . COPD (chronic obstructive pulmonary disease) ([place])   . Coronary atherosclerosis of native coronary artery    a. CABG x 4 in 1994. Previous stent placement to SVG to distal RCA;  b. DES to native Cx in 2006;  c. DES SVG to RCA 05/08/11; d. Inf MI/Cath/PCI: LM 25, LAD 100, LCX patent stent, 50 into OM, RI 60-70, RCA 100, LIMA->LAD nl, VG->Diag 100 old, VG->RCA/PL 99 (DES x 1), EF 50-55%.  . Depression    With anxious features/hx panic  attacks  . Essential hypertension, benign   . GERD (gastroesophageal reflux disease)   . Hyperlipidemia    Not on statin 2/2 hx of intolerance.   . Morbid obesity ([place])   . Nephrolithiasis   . Pneumonia   . Sleep apnea   . ST elevation myocardial infarction (STEMI) of inferior wall ([place])    4/14 - DES SVG to PDA  . Type 2 diabetes mellitus ([place])     Past Surgical History:  Procedure Laterality Date  . CORONARY ARTERY BYPASS GRAFT  1994  . EYE SURGERY    . LEFT HEART CATHETERIZATION WITH CORONARY ANGIOGRAM N/A 05/08/2011   Procedure: LEFT HEART CATHETERIZATION WITH CORONARY ANGIOGRAM;  Surgeon: [name], MD;  Location: [facility] CATH LAB;  Service: Cardiovascular;  Laterality: N/A;  . LEFT HEART CATHETERIZATION WITH CORONARY ANGIOGRAM N/A 08/08/2012   Procedure: LEFT HEART CATHETERIZATION WITH CORONARY ANGIOGRAM;  Surgeon: [name], MD;  Location: [facility] CATH LAB;  Service: Cardiovascular;  Laterality: N/A;  . PERCUTANEOUS CORONARY STENT INTERVENTION (PCI-S) Right 05/08/2011   Procedure: PERCUTANEOUS CORONARY STENT INTERVENTION (PCI-S);  Surgeon: [name], MD;  Location: [facility] CATH LAB;  Service: Cardiovascular;  Laterality: Right;  . PERCUTANEOUS CORONARY STENT INTERVENTION (PCI-S) N/A 08/08/2012   Procedure: PERCUTANEOUS CORONARY STENT INTERVENTION (PCI-S);  Surgeon: [name], MD;  Location: [facility] CATH LAB;  Service: Cardiovascular;  Laterality: N/A;  Inpatient Medications: Scheduled Meds: . allopurinol  100 mg Oral Daily  . alprazolam  1 mg Oral QHS  . aspirin EC  81 mg Oral q morning - 10a  . clopidogrel  75 mg Oral Daily  . diltiazem  240 mg Oral Daily  . furosemide  40 mg Intravenous BID  . insulin aspart  0-15 Units Subcutaneous TID WC  . insulin aspart  0-5 Units Subcutaneous QHS  . insulin glargine  60 Units Subcutaneous BID  . isosorbide mononitrate  30 mg Oral Daily  . metoprolol tartrate  25 mg Oral BID  . nitroGLYCERIN  1 inch Topical Q6H   . tamsulosin  0.4 mg Oral QPM   Continuous Infusions: . heparin 1,000 Units/hr (07/21/18 1213)   PRN Meds: acetaminophen, morphine injection, nitroGLYCERIN, ondansetron (ZOFRAN) IV, polyethylene glycol  Allergies:    Allergies  Allergen Reactions  . Zocor [Simvastatin] Other (See Comments)    fatigue  . Lipitor [Atorvastatin] Other (See Comments)    Muscle cramping    Social History:   Social History   Socioeconomic History  . Marital status: Married    Spouse name: Not on file  . Number of children: Not on file  . Years of education: Not on file  . Highest education level: Not on file  Occupational History  . Not on file  Social Needs  . Financial resource strain: Not on file  . Food insecurity:    Worry: Not on file    Inability: Not on file  . Transportation needs:    Medical: Not on file    Non-medical: Not on file  Tobacco Use  . Smoking status: Never Smoker  . Smokeless tobacco: Never Used  Substance and Sexual Activity  . Alcohol use: No    Alcohol/week: 0.0 standard drinks  . Drug use: No  . Sexual activity: Never  Lifestyle  . Physical activity:    Days per week: Not on file    Minutes per session: Not on file  . Stress: Not on file  Relationships  . Social connections:    Talks on phone: Not on file    Gets together: Not on file    Attends religious service: Not on file    Active member of club or organization: Not on file    Attends meetings of clubs or organizations: Not on file    Relationship status: Not on file  . Intimate partner violence:    Fear of current or ex partner: Not on file    Emotionally abused: Not on file    Physically abused: Not on file    Forced sexual activity: Not on file  Other Topics Concern  . Not on file  Social History Narrative  . Not on file    Family History:   The patient's family history includes Heart attack in his mother.  ROS:  Please see the history of present illness.  Hearing loss.  All other  ROS reviewed and negative.     Physical Exam/Data:   Vitals:   07/21/18 1230 07/21/18 1245 07/21/18 1300 07/21/18 1500  BP: (!) 145/69  135/79 (!) 161/74  Pulse: 81 82 82 81  Resp: 15 13 17 18   Temp:    97.6 F (36.4 C)  TempSrc:    Oral  SpO2: 93% 94% 94% 92%  Weight:      Height:       No intake or output data in the 24 hours ending 07/21/18 [place]  07/21/18 0833  Weight: 124.7 kg   Body mass index is 45.76 kg/m.   Gen: Morbidly obese male in no distress. HEENT: Conjunctiva and lids normal, oropharynx clear. Neck: Supple, elevated JVP, no carotid bruits, no thyromegaly. Lungs: Decreased breath sounds at the bases, nonlabored breathing at rest. Cardiac: Regular rate and rhythm, no S3 or significant systolic murmur, no pericardial rub. Abdomen: Soft, nontender, no hepatomegaly, bowel sounds present, no guarding or rebound. Extremities: No pitting edema, distal pulses 2+. Skin: Warm and dry. Musculoskeletal: No kyphosis. Neuropsychiatric: Alert and oriented x3, affect grossly appropriate.  EKG:  I personally reviewed the tracing from 12/2018 which shows sinus rhythm with atrial ectopy, probable old inferior infarct pattern based on serial tracings with diffuse nonspecific ST-T changes.  Telemetry:  I personally reviewed telemetry which shows sinus rhythm with atrial and ventricular ectopy.  Relevant CV Studies:  Echocardiogram 07/20/2016: Study Conclusions  - Procedure narrative: Transthoracic echocardiography. Image   quality was fair. Contrast enhancement utilized. - Left ventricle: The cavity size was normal. There was moderate   concentric hypertrophy. Systolic function was normal. The   estimated ejection fraction was in the range of 50% to 55%.   Doppler parameters are consistent with abnormal left ventricular   relaxation (grade 1 diastolic dysfunction). Doppler parameters   are consistent with high ventricular filling pressure. - Regional wall  motion abnormality: Hypokinesis of the mid   anterior, mid anteroseptal, and mid inferoseptal myocardium. - Aortic valve: Trileaflet; mildly calcified leaflets. - Mitral valve: Calcified annulus. Mildly calcified leaflets . - Left atrium: The atrium was mildly dilated. - Right ventricle: Systolic function was mildly reduced.  Laboratory Data:  Chemistry Recent Labs  Lab 07/21/18 0835  NA 134*  K 4.1  CL 92*  CO2 29  GLUCOSE 341*  BUN 59*  CREATININE 2.31*  CALCIUM 9.4  GFRNONAA 25*  GFRAA 29*  ANIONGAP 13    No results for input(s): PROT, ALBUMIN, AST, ALT, ALKPHOS, BILITOT in the last 168 hours. Hematology Recent Labs  Lab 07/21/18 0835  WBC 8.8  RBC 4.42  HGB 13.0  HCT 40.5  MCV 91.6  MCH 29.4  MCHC 32.1  RDW 13.1  PLT 155   Cardiac Enzymes Recent Labs  Lab 07/21/18 0835 07/21/18 1155  TROPONINI 0.13* 0.42*   No results for input(s): TROPIPOC in the last 168 hours.  BNP Recent Labs  Lab 07/21/18 0835  BNP 126.0*    DDimer No results for input(s): DDIMER in the last 168 hours.  Radiology/Studies:  Dg Chest Port 1 View  Result Date: 07/21/2018 CLINICAL DATA:  85 year old male with a history of chest pain EXAM: PORTABLE CHEST 1 VIEW COMPARISON:  07/19/2016, 01/17/2015 FINDINGS: Cardiomediastinal silhouette likely unchanged with cardiomegaly. Heart borders partially obscured by soft tissues of the chest wall and lung/pleural disease. Surgical changes of median sternotomy. Patchy opacities at the lung bases in the retrocardiac region with obscuration of the left hemidiaphragm. IMPRESSION: Low lung volumes, with bibasilar opacities, potentially combination of atelectasis, scarring, and/or consolidation. Surgical changes of median sternotomy. Electronically Signed   By: Corrie Mckusick D.O.   On: 07/21/2018 09:16    Assessment and Plan:   1.  Recent onset chest tightness, consistent with unstable angina.  Troponin I levels are mildly increased in the setting of  known ischemic heart disease and also CKD stage III.  Chest pain has resolved at present and his ECG shows largely old ST segment abnormalities.  2.  Multivessel CAD status post CABG  in 1994 with subsequently documented graft disease and multiple percutaneous interventions as detailed above.  3.  Chronic diastolic heart failure, most recent LVEF 50 to 55% by echocardiogram in April 2018.  Follow-up study has been ordered.  4.  CKD stage III, current creatinine 2.31.  5.  Morbid obesity.  6.  Statin intolerance.  Patient has been admitted to the hospitalist service for further management.  Continue aspirin and Plavix, agree with heparin infusion while cycling cardiac markers.  He is now on Lasix 40 mg IV twice daily and lieu of outpatient regimen which included Demadex with as needed Zaroxolyn.  Increase Lopressor to 37.5 mg twice daily and increase Imdur to 30 mg twice daily.  Follow-up ECG in a.m.  Ideally, we will try to manage him medically at this point and defer cardiac catheterization for refractory symptoms or declining clinical status.  Signed, [name], MD  07/21/2018 4:20 PM

## 2018-07-21 NOTE — Progress Notes (Signed)
ANTICOAGULATION CONSULT NOTE - Initial Consult  Pharmacy Consult for heparin gtt  Indication: chest pain/ACS  Allergies  Allergen Reactions  . Zocor [Simvastatin] Other (See Comments)    fatigue  . Lipitor [Atorvastatin] Other (See Comments)    Muscle cramping    Patient Measurements: Height: 5\' 5"  (165.1 cm) Weight: 275 lb (124.7 kg) IBW/kg (Calculated) : 61.5 Heparin Dosing Weight: HEPARIN DW (KG): 91.2   Vital Signs: Temp: 98.1 F (36.7 C) (04/09 0841) Temp Source: Oral (04/09 0841) BP: 159/86 (04/09 1130) Pulse Rate: 89 (04/09 1115)  Labs: Recent Labs    07/21/18 0835  HGB 13.0  HCT 40.5  PLT 155  LABPROT 14.0  INR 1.1  CREATININE 2.31*  TROPONINI 0.13*    Estimated Creatinine Clearance: 29.7 mL/min (A) (by C-G formula based on SCr of 2.31 mg/dL (H)).   Medical History: Past Medical History:  Diagnosis Date  . Allergic rhinitis   . Anxiety   . Arthritis   . COPD (chronic obstructive pulmonary disease) ([place])   . Coronary atherosclerosis of native coronary artery    a. CABG x 4 in 1994. Previous stent placement to SVG to distal RCA;  b. DES to native Cx in 2006;  c. DES SVG to RCA 05/08/11; d. Inf MI/Cath/PCI: LM 25, LAD 100, LCX patent stent, 50 into OM, RI 60-70, RCA 100, LIMA->LAD nl, VG->Diag 100 old, VG->RCA/PL 99 (DES x 1), EF 50-55%.  . Depression    With anxious features/hx panic attacks  . Essential hypertension, benign   . GERD (gastroesophageal reflux disease)   . Hyperlipidemia    Not on statin 2/2 hx of intolerance.   . Morbid obesity ([place])   . Nephrolithiasis   . Pneumonia   . Sleep apnea   . ST elevation myocardial infarction (STEMI) of inferior wall ([place])    4/14 - DES SVG to PDA  . Type 2 diabetes mellitus ([facility])     Medications:  (Not in a hospital admission)  Scheduled:  . furosemide  40 mg Intravenous Once  . heparin  4,000 Units Intravenous Once   Infusions:  . heparin     PRN: morphine injection,  nitroGLYCERIN Anti-infectives (From admission, onward)   None      Assessment: [name] a 85 y.o. male requires anticoagulation with a heparin iv infusion for the indication of  chest pain/ACS. Heparin gtt will be started following pharmacy protocol per pharmacy consult. Patient is not on previous oral anticoagulant that will require aPTT/HL correlation before transitioning to only HL monitoring.   Goal of Therapy:  Heparin level 0.3-0.7 units/ml Monitor platelets by anticoagulation protocol: Yes   Plan:  Give 4000 units bolus x 1 Start heparin infusion at 1000 units/hr Check anti-Xa level in 8 hours and daily while on heparin Continue to monitor H&H and platelets  Heparin level to be drawn in 8 hours for patients >71 years old or crcl < 23ml/min  Tylique Aull 07/21/2018,11:55 AM

## 2018-07-21 NOTE — ED Provider Notes (Signed)
[facility] EMERGENCY DEPARTMENT Provider Note   CSN: [id number] Arrival date & time: 07/21/18  3295    History   Chief Complaint Chief Complaint  Patient presents with   Chest Pain    HPI [name] is a 85 y.o. male.      HPI  Pt was seen at 0835. Per pt, c/o gradual onset and persistence of multiple intermittent episodes of chest "pains" since approximately 1800 last night. Describes the CP as mid-sternal chest "pressure" which radiates into his left shoulder. Pt states his symptoms last approximately 15-20 minutes before resolving after he takes his own SL ntg. Pt states "it just comes back." States his bilat LE's also have been "more swollen" for "a while." Pt does not weigh himself but endorses compliance with his "fluid pill." Denies palpitations, no SOB/cough, no abd pain, no N/V/D, no back pain, no fevers, no rash.     Past Medical History:  Diagnosis Date   Allergic rhinitis    Anxiety    Arthritis    COPD (chronic obstructive pulmonary disease) (HCC)    Coronary atherosclerosis of native coronary artery    a. CABG x 4 in 1994. Previous stent placement to SVG to distal RCA;  b. DES to native Cx in 2006;  c. DES SVG to RCA 05/08/11; d. Inf MI/Cath/PCI: LM 25, LAD 100, LCX patent stent, 50 into OM, RI 60-70, RCA 100, LIMA->LAD nl, VG->Diag 100 old, VG->RCA/PL 99 (DES x 1), EF 50-55%.   Depression    With anxious features/hx panic attacks   Essential hypertension, benign    GERD (gastroesophageal reflux disease)    Hyperlipidemia    Not on statin 2/2 hx of intolerance.    Morbid obesity ([place])    Nephrolithiasis    Pneumonia    Sleep apnea    ST elevation myocardial infarction (STEMI) of inferior wall ([place])    4/14 - DES SVG to PDA   Type 2 diabetes mellitus [facility])     Patient Active Problem List   Diagnosis Date Noted   Acute on chronic diastolic heart failure (HCC)    CKD (chronic kidney disease) stage 3, GFR 30-59 ml/min ([place])  01/01/2014   CAD (coronary artery disease) of artery bypass graft 08/08/2012   Sleep apnea    Essential hypertension, benign    Obesity, Class III, BMI 40-49.9 (morbid obesity) ([place])    Hyperlipidemia    DM type 2 (diabetes mellitus, type 2) ([place])    Coronary atherosclerosis of native coronary artery     Past Surgical History:  Procedure Laterality Date   CORONARY ARTERY BYPASS GRAFT  1994   EYE SURGERY     LEFT HEART CATHETERIZATION WITH CORONARY ANGIOGRAM N/A 05/08/2011   Procedure: LEFT HEART CATHETERIZATION WITH CORONARY ANGIOGRAM;  Surgeon: [name], MD;  Location[facility] CATH LAB;  Service: Cardiovascular;  Laterality: N/A;   LEFT HEART CATHETERIZATION WITH CORONARY ANGIOGRAM N/A 08/08/2012   Procedure: LEFT HEART CATHETERIZATION WITH CORONARY ANGIOGRAM;  Surgeon: [name], MD;  Location: [facility] CATH LAB;  Service: Cardiovascular;  Laterality: N/A;   PERCUTANEOUS CORONARY STENT INTERVENTION (PCI-S) Right 05/08/2011   Procedure: PERCUTANEOUS CORONARY STENT INTERVENTION (PCI-S);  Surgeon: [name], MD;  Location: [facility] CATH LAB;  Service: Cardiovascular;  Laterality: Right;   PERCUTANEOUS CORONARY STENT INTERVENTION (PCI-S) N/A 08/08/2012   Procedure: PERCUTANEOUS CORONARY STENT INTERVENTION (PCI-S);  Surgeon: [name], MD;  Location: [facility] CATH LAB;  Service: Cardiovascular;  Laterality: N/A;  Home Medications    Prior to Admission medications   Medication Sig Start Date End Date Taking? Authorizing Provider  allopurinol (ZYLOPRIM) 100 MG tablet Take 1 tablet (100 mg total) by mouth daily. 05/28/14   Brett Canales, PA-C  alprazolam Duanne Moron) 2 MG tablet Take 1 mg by mouth at bedtime.     [provider]  aspirin EC 81 MG tablet Take 81 mg by mouth every morning.    [provider]  clopidogrel (PLAVIX) 75 MG tablet take 1 tablet by mouth once daily WITH BREAKFAST 10/19/17   Satira Sark, MD  cyclobenzaprine  (FLEXERIL) 10 MG tablet Take 10 mg by mouth at bedtime.     [provider]  diltiazem (CARDIZEM CD) 240 MG 24 hr capsule Take 240 mg by mouth daily.    [provider]  isosorbide mononitrate (IMDUR) 30 MG 24 hr tablet Take 1 tablet (30 mg total) by mouth daily. 09/07/17 03/08/18  Satira Sark, MD  metolazone (ZAROXOLYN) 2.5 MG tablet TAKE 1 TABLET ONCE DAILY FOR 2 TO 3 DAYS AS NEEDED ONLY FOR WEIGHT GAIN OF 3 TO 5 LBS IN A 48 HR PERIOD 04/04/18   Satira Sark, MD  metoprolol tartrate (LOPRESSOR) 25 MG tablet TAKE 1 TABLET BY MOUTH TWICE A DAY 07/04/18   Satira Sark, MD  nitroGLYCERIN (NITROLINGUAL) 0.4 MG/SPRAY spray use 1-2 sprays under the tongue if needed for chest pain *MAXIMUM OF 3 SPRAYS IN 15 MINUTES 12/20/17   Satira Sark, MD  polyethylene glycol powder (GLYCOLAX/MIRALAX) powder Take 17 g by mouth daily as needed (constipation).  02/08/13   [provider]  psyllium (METAMUCIL) 58.6 % powder Take by mouth every morning.    [provider]  tamsulosin (FLOMAX) 0.4 MG CAPS capsule Take 0.4 mg by mouth every evening.  01/11/13   [provider]  torsemide (DEMADEX) 20 MG tablet Take Two Tablets ( 40 mg )  Daily Alternating with One Tablet (20 mg) Daily 03/31/18   Satira Sark, MD  TOUJEO SOLOSTAR 300 UNIT/ML SOPN inject 60 units BID 12/18/14   [provider]    Family History Family History  Problem Relation Age of Onset   Heart attack Mother        Age 54    Social History Social History   Tobacco Use   Smoking status: Never Smoker   Smokeless tobacco: Never Used  Substance Use Topics   Alcohol use: No    Alcohol/week: 0.0 standard drinks   Drug use: No     Allergies   Zocor [simvastatin] and Lipitor [atorvastatin]   Review of Systems Review of Systems ROS: Statement: All systems negative except as marked or noted in the HPI; Constitutional: Negative for fever and chills. ; ; Eyes:  Negative for eye pain, redness and discharge. ; ; ENMT: Negative for ear pain, hoarseness, nasal congestion, sinus pressure and sore throat. ; ; Cardiovascular: Negative for palpitations, diaphoresis, dyspnea and +CP, peripheral edema. ; ; Respiratory: Negative for cough, wheezing and stridor. ; ; Gastrointestinal: Negative for nausea, vomiting, diarrhea, abdominal pain, blood in stool, hematemesis, jaundice and rectal bleeding. . ; ; Genitourinary: Negative for dysuria, flank pain and hematuria. ; ; Musculoskeletal: Negative for back pain and neck pain. Negative for swelling and trauma.; ; Skin: Negative for pruritus, rash, abrasions, blisters, bruising and skin lesion.; ; Neuro: Negative for headache, lightheadedness and neck stiffness. Negative for weakness, altered level of consciousness, altered mental status, extremity  weakness, paresthesias, involuntary movement, seizure and syncope.       Physical Exam Updated Vital Signs BP (!) 142/75 (BP Location: Left Arm)    Pulse (!) 101    Temp 98.1 F (36.7 C) (Oral)    Resp (!) 23    Ht 5\' 5"  (1.651 m)    Wt 124.7 kg    SpO2 91%    BMI 45.76 kg/m    Patient Vitals for the past 24 hrs:  BP Temp Temp src Pulse Resp SpO2 Height Weight  07/21/18 1200 (!) 145/70 97.7 F (36.5 C) Oral 71 16 96 % -- --  07/21/18 1130 (!) 159/86 -- -- -- 16 -- -- --  07/21/18 1115 -- -- -- 89 13 99 % -- --  07/21/18 1100 (!) 149/75 -- -- 88 13 96 % -- --  07/21/18 1030 -- -- -- 90 16 97 % -- --  07/21/18 0841 -- 98.1 F (36.7 C) Oral -- -- -- -- --  07/21/18 0836 (!) 142/75 -- -- (!) 101 (!) 23 91 % -- --  07/21/18 0833 -- -- -- -- -- -- 5\' 5"  (1.651 m) 124.7 kg     Physical Exam 0840; Physical examination:  Nursing notes reviewed; Vital signs and O2 SAT reviewed;  Constitutional: Well developed, Well nourished, Well hydrated, In no acute distress; Head:  Normocephalic, atraumatic; Eyes: EOMI, PERRL, No scleral icterus; ENMT: Mouth and pharynx normal, Mucous  membranes moist; Neck: Supple, Full range of motion, No lymphadenopathy; Cardiovascular: Regular rate and rhythm, No gallop; Respiratory: Breath sounds clear & equal bilaterally, No wheezes.  Speaking full sentences with ease, Normal respiratory effort/excursion; Chest: Nontender, Movement normal; Abdomen: Soft, Nontender, Nondistended, Normal bowel sounds; Genitourinary: No CVA tenderness; Extremities: Peripheral pulses normal, No tenderness, +2 pedal edema bilat. No calf tenderness or asymmetry.; Neuro: AA&Ox3, +HOH, otherwise major CN grossly intact.  Speech clear. No gross focal motor deficits in extremities.; Skin: Color normal, Warm, Dry.   ED Treatments / Results  Labs (all labs ordered are listed, but only abnormal results are displayed)   EKG EKG Interpretation  Date/Time:  Thursday July 21 2018 08:35:36 EDT   EKG #1 Ventricular Rate:  101 PR Interval:    QRS Duration: 114 QT Interval:  340 QTC Calculation: 441 R Axis:   101 Text Interpretation:  Sinus or ectopic atrial tachycardia Atrial premature complexes Borderline prolonged PR interval IRBBB and LPFB Low voltage, precordial leads Abnormal R-wave progression, late transition ST elevation, consider inferior injury Baseline wander When compared with ECG of 07/23/2016, 02/18/2017 No significant change was found Confirmed by Francine Graven [phone]) on 07/21/2018 9:00:10 AM   ED ECG REPORT   Date: 07/21/2018  0843  EKG #2  Rate: 107  Rhythm: sinus tachycardia  QRS Axis: normal  Intervals: normal  ST/T Wave abnormalities: nonspecific ST/T changes inferior, lateral leads  Conduction Disutrbances:left posterior fascicular block and nonspecific intraventricular conduction delay  Narrative Interpretation:   Old EKG Reviewed: unchanged, no significant changes since last tracing today  I have personally reviewed the EKG tracing and agree with the computerized printout as noted.    EKG Interpretation  Date/Time:  Thursday July 21 2018 09:04:25 EDT  EKG #3 Ventricular Rate:  98 PR Interval:    QRS Duration: 115 QT Interval:  355 QTC Calculation: 381 R Axis:   28 Text Interpretation:  Normal sinus rhythm IRBBB and LPFB Low voltage, precordial leads ST elevation, consider inferior injury Baseline wander Artifact Since last tracing of  earlier today No significant change was found Confirmed by Francine Graven [phone]) on 07/21/2018 9:10:42 AM          Radiology   Procedures Procedures (including critical care time)  Medications Ordered in ED Medications  aspirin chewable tablet 324 mg (has no administration in time range)  nitroGLYCERIN (NITROSTAT) SL tablet 0.4 mg (has no administration in time range)  morphine 4 MG/ML injection 4 mg (has no administration in time range)     Initial Impression / Assessment and Plan / ED Course  I have reviewed the triage vital signs and the nursing notes.  Pertinent labs & imaging results that were available during my care of the patient were reviewed by me and considered in my medical decision making (see chart for details).     MDM Reviewed: previous chart, nursing note and vitals Reviewed previous: labs and ECG Interpretation: labs, ECG and x-ray Total time providing critical care: 30-74 minutes. This excludes time spent performing separately reportable procedures and services. Consults: cardiology   CRITICAL CARE Performed by: Francine Graven Total critical care time: 35 minutes Critical care time was exclusive of separately billable procedures and treating other patients. Critical care was necessary to treat or prevent imminent or life-threatening deterioration. Critical care was time spent personally by me on the following activities: development of treatment plan with patient and/or surrogate as well as nursing, discussions with consultants, evaluation of patient's response to treatment, examination of patient, obtaining history from patient or surrogate,  ordering and performing treatments and interventions, ordering and review of laboratory studies, ordering and review of radiographic studies, pulse oximetry and re-evaluation of patient's condition.   Results for orders placed or performed during the hospital encounter of 86/76/72  Basic metabolic panel  Result Value Ref Range   Sodium 134 (L) 135 - 145 mmol/L   Potassium 4.1 3.5 - 5.1 mmol/L   Chloride 92 (L) 98 - 111 mmol/L   CO2 29 22 - 32 mmol/L   Glucose, Bld 341 (H) 70 - 99 mg/dL   BUN 59 (H) 8 - 23 mg/dL   Creatinine, Ser 2.31 (H) 0.61 - 1.24 mg/dL   Calcium 9.4 8.9 - 10.3 mg/dL   GFR calc non Af Amer 25 (L) >60 mL/min   GFR calc Af Amer 29 (L) >60 mL/min   Anion gap 13 5 - 15  Brain natriuretic peptide  Result Value Ref Range   B Natriuretic Peptide 126.0 (H) 0.0 - 100.0 pg/mL  Troponin I - Once  Result Value Ref Range   Troponin I 0.13 (HH) <0.03 ng/mL  CBC with Differential  Result Value Ref Range   WBC 8.8 4.0 - 10.5 K/uL   RBC 4.42 4.22 - 5.81 MIL/uL   Hemoglobin 13.0 13.0 - 17.0 g/dL   HCT 40.5 39.0 - 52.0 %   MCV 91.6 80.0 - 100.0 fL   MCH 29.4 26.0 - 34.0 pg   MCHC 32.1 30.0 - 36.0 g/dL   RDW 13.1 11.5 - 15.5 %   Platelets 155 150 - 400 K/uL   nRBC 0.0 0.0 - 0.2 %   Neutrophils Relative % 70 %   Neutro Abs 6.2 1.7 - 7.7 K/uL   Lymphocytes Relative 14 %   Lymphs Abs 1.3 0.7 - 4.0 K/uL   Monocytes Relative 14 %   Monocytes Absolute 1.2 (H) 0.1 - 1.0 K/uL   Eosinophils Relative 1 %   Eosinophils Absolute 0.0 0.0 - 0.5 K/uL   Basophils Relative 0 %  Basophils Absolute 0.0 0.0 - 0.1 K/uL   Immature Granulocytes 1 %   Abs Immature Granulocytes 0.05 0.00 - 0.07 K/uL  Protime-INR  Result Value Ref Range   Prothrombin Time 14.0 11.4 - 15.2 seconds   INR 1.1 0.8 - 1.2   Dg Chest Port 1 View Result Date: 07/21/2018 CLINICAL DATA:  85 year old male with a history of chest pain EXAM: PORTABLE CHEST 1 VIEW COMPARISON:  07/19/2016, 01/17/2015 FINDINGS:  Cardiomediastinal silhouette likely unchanged with cardiomegaly. Heart borders partially obscured by soft tissues of the chest wall and lung/pleural disease. Surgical changes of median sternotomy. Patchy opacities at the lung bases in the retrocardiac region with obscuration of the left hemidiaphragm. IMPRESSION: Low lung volumes, with bibasilar opacities, potentially combination of atelectasis, scarring, and/or consolidation. Surgical changes of median sternotomy. Electronically Signed   By: Corrie Mckusick D.O.   On: 07/21/2018 09:16     0845:  EKG abnl, but does appear similar to previous in MUSE.  T/C returned from [facility] Dr. Tamala Julian, case discussed, including:  HPI, pertinent PM/SHx, VS/PE, dx testing, ED course and treatment:  He has viewed the EKG's, states not diagnostic of STEMI, but given pt's hx, likely UA and pt will need admit to cycle troponin, diurese. Workup in progress. Will call [facility] Cards Dr. [name] when completed.   1145:  2nd troponin pending. BUN/Cr elevated per baseline. Will dose IV lasix and start IV heparin. ASA and morphine already given with improvement in symptoms. T/C returned from Cards Dr. [name], case discussed, including:  HPI, pertinent PM/SHx, VS/PE, dx testing, ED course and treatment: agrees with IV lasix and IV heparin, he is pt's Cards MD and states pt often has CP from fluid overload, no acute cardiac intervention needed at this time and pt can stay at [facility] for admit to cycle troponin and diurese, likely will need further meds changes while hospitalized/before discharge.   1145:  T/C returned from [facility] Dr. Roderic Palau, case discussed, including:  HPI, pertinent PM/SHx, VS/PE, dx testing, ED course and treatment:  Agreeable to admit.      Final Clinical Impressions(s) / ED Diagnoses   Final diagnoses:  None    ED Discharge Orders    None       Francine Graven, DO 07/25/18 1259

## 2018-07-21 NOTE — ED Notes (Signed)
Date and time results received: 07/21/18 1006 (use smartphrase ".now" to insert current time)  Test: troponin Critical Value: 0.13  Name of Provider Notified: Dr. Thurnell Garbe  Orders Received? Or Actions Taken?: no/na

## 2018-07-21 NOTE — Progress Notes (Signed)
CRITICAL VALUE ALERT  Critical Value:  Troponin 0.91  Date & Time Notied:  07/21/2018 2245  Provider Notified: Kennon Holter  Orders Received/Actions taken:

## 2018-07-21 NOTE — ED Notes (Signed)
CRITICAL VALUE ALERT  Critical Value: troponin 0.42  Date & Time Notied: 1300    Provider Notified: [email or web address]  Orders Received/Actions taken: no new orders

## 2018-07-21 NOTE — H&P (Signed)
History and Physical    [name] FUX:[id number] DOB: [birth year] DOA: 07/21/2018  PCP: [name], MD  Patient coming from: Home  I have personally briefly reviewed patient's old medical records in [place]  Chief Complaint: Chest pain  HPI: [name] is a 85 y.o. male with medical history significant of coronary artery disease, COPD, chronic kidney disease stage III, chronic diastolic heart failure, presents to the emergency room today with complaints of chest discomfort.  He describes chest pain, mild shortness of breath, that began while he was resting.  He has had the symptoms since yesterday.  He reports being compliant with his medications, but does note that he has been retaining some excess fluid.  He is unsure of his weights.  He denies any fever or cough.  He has not had any nausea or vomiting.  He denies any diaphoresis or lightheadedness.  He was evaluated in the emergency room where EKG showed nonspecific changes.  Troponin was elevated 0.13 and then 0.42.  BNP of 126.  Creatinine of 2.31 which is near baseline.  Chest x-ray indicated bibasilar atelectasis.  Patient has been referred for admission.    Review of Systems: As per HPI otherwise 10 point review of systems negative.    Past Medical History:  Diagnosis Date  . Allergic rhinitis   . Anxiety   . Arthritis   . COPD (chronic obstructive pulmonary disease) ([place])   . Coronary atherosclerosis of native coronary artery    a. CABG x 4 in 1994. Previous stent placement to SVG to distal RCA;  b. DES to native Cx in 2006;  c. DES SVG to RCA 05/08/11; d. Inf MI/Cath/PCI: LM 25, LAD 100, LCX patent stent, 50 into OM, RI 60-70, RCA 100, LIMA->LAD nl, VG->Diag 100 old, VG->RCA/PL 99 (DES x 1), EF 50-55%.  . Depression    With anxious features/hx panic attacks  . Essential hypertension, benign   . GERD (gastroesophageal reflux disease)   . Hyperlipidemia    Not on statin 2/2 hx of intolerance.   . Morbid  obesity ([place])   . Nephrolithiasis   . Pneumonia   . Sleep apnea   . ST elevation myocardial infarction (STEMI) of inferior wall ([place])    4/14 - DES SVG to PDA  . Type 2 diabetes mellitus ([place])     Past Surgical History:  Procedure Laterality Date  . CORONARY ARTERY BYPASS GRAFT  1994  . EYE SURGERY    . LEFT HEART CATHETERIZATION WITH CORONARY ANGIOGRAM N/A 05/08/2011   Procedure: LEFT HEART CATHETERIZATION WITH CORONARY ANGIOGRAM;  Surgeon: [name], MD;  Location: [facility] CATH LAB;  Service: Cardiovascular;  Laterality: N/A;  . LEFT HEART CATHETERIZATION WITH CORONARY ANGIOGRAM N/A 08/08/2012   Procedure: LEFT HEART CATHETERIZATION WITH CORONARY ANGIOGRAM;  Surgeon: [name], MD;  Location: [facility] CATH LAB;  Service: Cardiovascular;  Laterality: N/A;  . PERCUTANEOUS CORONARY STENT INTERVENTION (PCI-S) Right 05/08/2011   Procedure: PERCUTANEOUS CORONARY STENT INTERVENTION (PCI-S);  Surgeon: [name], MD;  Location: [facility] CATH LAB;  Service: Cardiovascular;  Laterality: Right;  . PERCUTANEOUS CORONARY STENT INTERVENTION (PCI-S) N/A 08/08/2012   Procedure: PERCUTANEOUS CORONARY STENT INTERVENTION (PCI-S);  Surgeon: [name], MD;  Location: [facility] CATH LAB;  Service: Cardiovascular;  Laterality: N/A;    Social History:  reports that he has never smoked. He has never used smokeless tobacco. He reports that he does not drink alcohol or use drugs.  Allergies  Allergen  Reactions  . Zocor [Simvastatin] Other (See Comments)    fatigue  . Lipitor [Atorvastatin] Other (See Comments)    Muscle cramping    Family History  Problem Relation Age of Onset  . Heart attack Mother        Age 55     Prior to Admission medications   Medication Sig Start Date End Date Taking? Authorizing Provider  allopurinol (ZYLOPRIM) 100 MG tablet Take 1 tablet (100 mg total) by mouth daily. 05/28/14  Yes Brett Canales, PA-C  alprazolam Duanne Moron) 2 MG tablet Take 1 mg by mouth at  bedtime.    Yes [provider]  aspirin EC 81 MG tablet Take 81 mg by mouth every morning.   Yes [provider]  clopidogrel (PLAVIX) 75 MG tablet take 1 tablet by mouth once daily WITH BREAKFAST 10/19/17  Yes Satira Sark, MD  cyclobenzaprine (FLEXERIL) 10 MG tablet Take 10 mg by mouth at bedtime.    Yes [provider]  diltiazem (CARDIZEM CD) 240 MG 24 hr capsule Take 240 mg by mouth daily.   Yes [provider]  metolazone (ZAROXOLYN) 2.5 MG tablet TAKE 1 TABLET ONCE DAILY FOR 2 TO 3 DAYS AS NEEDED ONLY FOR WEIGHT GAIN OF 3 TO 5 LBS IN A 48 HR PERIOD 04/04/18  Yes Satira Sark, MD  metoprolol tartrate (LOPRESSOR) 25 MG tablet TAKE 1 TABLET BY MOUTH TWICE A DAY 07/04/18  Yes Satira Sark, MD  nitroGLYCERIN (NITROLINGUAL) 0.4 MG/SPRAY spray use 1-2 sprays under the tongue if needed for chest pain *MAXIMUM OF 3 SPRAYS IN 15 MINUTES 12/20/17  Yes Satira Sark, MD  polyethylene glycol powder (GLYCOLAX/MIRALAX) powder Take 17 g by mouth daily as needed (constipation).  02/08/13  Yes [provider]  psyllium (METAMUCIL) 58.6 % powder Take by mouth every morning.   Yes [provider]  tamsulosin (FLOMAX) 0.4 MG CAPS capsule Take 0.4 mg by mouth every evening.  01/11/13  Yes [provider]  torsemide (DEMADEX) 20 MG tablet Take Two Tablets ( 40 mg )  Daily Alternating with One Tablet (20 mg) Daily 03/31/18  Yes Satira Sark, MD  TOUJEO SOLOSTAR 300 UNIT/ML SOPN inject 60 units BID 12/18/14  Yes [provider]  isosorbide mononitrate (IMDUR) 30 MG 24 hr tablet Take 1 tablet (30 mg total) by mouth daily. 09/07/17 03/08/18  Satira Sark, MD    Physical Exam: Vitals:   07/21/18 1230 07/21/18 1245 07/21/18 1300 07/21/18 1500  BP: (!) 145/69  135/79 (!) 161/74  Pulse: 81 82 82 81  Resp: 15 13 17 18   Temp:    97.6 F (36.4 C)  TempSrc:    Oral  SpO2: 93% 94% 94% 92%  Weight:      Height:         Constitutional: NAD, calm, comfortable Eyes: PERRL, lids and conjunctivae normal ENMT: Mucous membranes are moist. Posterior pharynx clear of any exudate or lesions.Normal dentition.  Neck: normal, supple, no masses, no thyromegaly Respiratory: clear to auscultation bilaterally, no wheezing, no crackles. Normal respiratory effort. No accessory muscle use.  Cardiovascular: Regular rate and rhythm, no murmurs / rubs / gallops. 2+ extremity edema. 2+ pedal pulses. No carotid bruits.  Abdomen: no tenderness, no masses palpated. No hepatosplenomegaly. Bowel sounds positive.  Musculoskeletal: no clubbing / cyanosis. No joint deformity upper and lower extremities. Good ROM, no contractures. Normal muscle tone.  Skin: no rashes, lesions, ulcers. No induration Neurologic: CN 2-12  grossly intact. Sensation intact, DTR normal. Strength 5/5 in all 4.  Psychiatric: Normal judgment and insight. Alert and oriented x 3. Normal mood.    Labs on Admission: I have personally reviewed following labs and imaging studies  CBC: Recent Labs  Lab 07/21/18 0835  WBC 8.8  NEUTROABS 6.2  HGB 13.0  HCT 40.5  MCV 91.6  PLT 638   Basic Metabolic Panel: Recent Labs  Lab 07/21/18 0835  NA 134*  K 4.1  CL 92*  CO2 29  GLUCOSE 341*  BUN 59*  CREATININE 2.31*  CALCIUM 9.4   GFR: Estimated Creatinine Clearance: 29.7 mL/min (A) (by C-G formula based on SCr of 2.31 mg/dL (H)). Liver Function Tests: No results for input(s): AST, ALT, ALKPHOS, BILITOT, PROT, ALBUMIN in the last 168 hours. No results for input(s): LIPASE, AMYLASE in the last 168 hours. No results for input(s): AMMONIA in the last 168 hours. Coagulation Profile: Recent Labs  Lab 07/21/18 0835  INR 1.1   Cardiac Enzymes: Recent Labs  Lab 07/21/18 0835 07/21/18 1155 07/21/18 1530  TROPONINI 0.13* 0.42* 0.77*   BNP (last 3 results) No results for input(s): PROBNP in the last 8760 hours. HbA1C: No results for input(s): HGBA1C in the  last 72 hours. CBG: Recent Labs  Lab 07/21/18 1624  GLUCAP 259*   Lipid Profile: No results for input(s): CHOL, HDL, LDLCALC, TRIG, CHOLHDL, LDLDIRECT in the last 72 hours. Thyroid Function Tests: No results for input(s): TSH, T4TOTAL, FREET4, T3FREE, THYROIDAB in the last 72 hours. Anemia Panel: No results for input(s): VITAMINB12, FOLATE, FERRITIN, TIBC, IRON, RETICCTPCT in the last 72 hours. Urine analysis: No results found for: COLORURINE, APPEARANCEUR, LABSPEC, PHURINE, GLUCOSEU, HGBUR, BILIRUBINUR, KETONESUR, PROTEINUR, UROBILINOGEN, NITRITE, LEUKOCYTESUR  Radiological Exams on Admission: Dg Chest Port 1 View  Result Date: 07/21/2018 CLINICAL DATA:  85 year old male with a history of chest pain EXAM: PORTABLE CHEST 1 VIEW COMPARISON:  07/19/2016, 01/17/2015 FINDINGS: Cardiomediastinal silhouette likely unchanged with cardiomegaly. Heart borders partially obscured by soft tissues of the chest wall and lung/pleural disease. Surgical changes of median sternotomy. Patchy opacities at the lung bases in the retrocardiac region with obscuration of the left hemidiaphragm. IMPRESSION: Low lung volumes, with bibasilar opacities, potentially combination of atelectasis, scarring, and/or consolidation. Surgical changes of median sternotomy. Electronically Signed   By: Corrie Mckusick D.O.   On: 07/21/2018 09:16    EKG: Independently reviewed.  Sinus rhythm with nonspecific ST-T changes  Assessment/Plan Active Problems:   Essential hypertension, benign   Hyperlipidemia   DM type 2 (diabetes mellitus, type 2) (HCC)   CKD (chronic kidney disease) stage 3, GFR 30-59 ml/min (HCC)   Acute on chronic diastolic heart failure (HCC)   Chest pain   Elevated troponin     1. Chest pain with concerns for unstable angina.  Continue to monitor troponins.  Continue on aspirin and Plavix.  He has been started on heparin infusion.  Cardiology following.  Check echocardiogram. 2. Coronary artery disease.   Continue on aspirin and Plavix.  Beta-blockers and Imdur dose has been increased.  He is also on Nitropaste. 3. Acute on chronic diastolic heart failure.  He does have some lower extremity edema.  He did start on intravenous Lasix.  Follow-up echocardiogram has been ordered.  Monitor urine output. 4. Chronic kidney disease stage III.  Creatinine is currently near baseline.  Continue to monitor in the setting of diuresis. 5. Morbid obesity 6. Diabetes.  Continue on home dose of basal insulin.  Supplement  with sliding scale. 7. Hyperlipidemia.  He has a statin intolerance.  DVT prophylaxis: Heparin infusion Code Status: DNR Family Communication: Discussed with son, Shonna Chock over the phone Disposition Plan: Discharge home once work-up is complete Consults called: Cardiology Admission status: Observation, telemetry  Kathie Dike MD [facility]   If 7PM-7AM, please contact night-coverage [email or web address]   07/21/2018, 6:57 PM

## 2018-07-21 NOTE — Telephone Encounter (Signed)
   Received page form Answering Service. Called and spoke with the patient and his wife. Patient having substernal chest pain radiating to left shoulder throughout the night. He has tried his Nitroglycerin spray (which usually helps) multiple times with no relief. Patient currently ranks the pain as a 5/10. Unable to describe the pain and state "it just hurts" and he has to do something about it. No associated shortness of breath, diaphoresis, or nausea. Patient has a history of CAD with remote CABG and subsequent stenting. Given patient's history and symptoms, recommended going to the ED for further evaluation. Patient denies any fever, chills, body aches, respiratory symptoms, or exposure to COVID-19. Advised wife to drive the patient or call EMS if she is uncomfortable driving. Patient and wife voiced understanding and agreed with plan.  Darreld Mclean, PA-C 07/21/2018 7:20 AM

## 2018-07-21 NOTE — ED Triage Notes (Addendum)
Pt reports chest pain and left shoulder pain since last night last approx 6pm Denies SOB. Pain is intermittent and last approx 15 mins. Pt reports taking nitro and it makes it better but the pain comes back. Increase in leg swelling

## 2018-07-21 NOTE — Care Management Obs Status (Signed)
[facility] NOTIFICATION   Patient Details  Name: [name] MRN: [id number] Date of Birth: [birth year]   Medicare Observation Status Notification Given:  Yes    Trish Mage, LCSW 07/21/2018, 2:23 PM

## 2018-07-22 DIAGNOSIS — R531 Weakness: Secondary | ICD-10-CM | POA: Diagnosis present

## 2018-07-22 DIAGNOSIS — J449 Chronic obstructive pulmonary disease, unspecified: Secondary | ICD-10-CM | POA: Diagnosis present

## 2018-07-22 DIAGNOSIS — I25110 Atherosclerotic heart disease of native coronary artery with unstable angina pectoris: Secondary | ICD-10-CM | POA: Diagnosis present

## 2018-07-22 DIAGNOSIS — I429 Cardiomyopathy, unspecified: Secondary | ICD-10-CM

## 2018-07-22 DIAGNOSIS — Z951 Presence of aortocoronary bypass graft: Secondary | ICD-10-CM | POA: Diagnosis not present

## 2018-07-22 DIAGNOSIS — R079 Chest pain, unspecified: Secondary | ICD-10-CM | POA: Diagnosis present

## 2018-07-22 DIAGNOSIS — I5043 Acute on chronic combined systolic (congestive) and diastolic (congestive) heart failure: Secondary | ICD-10-CM | POA: Diagnosis present

## 2018-07-22 DIAGNOSIS — I358 Other nonrheumatic aortic valve disorders: Secondary | ICD-10-CM | POA: Diagnosis present

## 2018-07-22 DIAGNOSIS — J309 Allergic rhinitis, unspecified: Secondary | ICD-10-CM | POA: Diagnosis present

## 2018-07-22 DIAGNOSIS — N400 Enlarged prostate without lower urinary tract symptoms: Secondary | ICD-10-CM | POA: Diagnosis present

## 2018-07-22 DIAGNOSIS — Z66 Do not resuscitate: Secondary | ICD-10-CM | POA: Diagnosis present

## 2018-07-22 DIAGNOSIS — G4730 Sleep apnea, unspecified: Secondary | ICD-10-CM | POA: Diagnosis present

## 2018-07-22 DIAGNOSIS — N183 Chronic kidney disease, stage 3 (moderate): Secondary | ICD-10-CM | POA: Diagnosis present

## 2018-07-22 DIAGNOSIS — I200 Unstable angina: Secondary | ICD-10-CM | POA: Diagnosis not present

## 2018-07-22 DIAGNOSIS — I452 Bifascicular block: Secondary | ICD-10-CM | POA: Diagnosis present

## 2018-07-22 DIAGNOSIS — I130 Hypertensive heart and chronic kidney disease with heart failure and stage 1 through stage 4 chronic kidney disease, or unspecified chronic kidney disease: Secondary | ICD-10-CM | POA: Diagnosis present

## 2018-07-22 DIAGNOSIS — E1122 Type 2 diabetes mellitus with diabetic chronic kidney disease: Secondary | ICD-10-CM | POA: Diagnosis present

## 2018-07-22 DIAGNOSIS — I5033 Acute on chronic diastolic (congestive) heart failure: Secondary | ICD-10-CM | POA: Diagnosis not present

## 2018-07-22 DIAGNOSIS — M109 Gout, unspecified: Secondary | ICD-10-CM | POA: Diagnosis present

## 2018-07-22 DIAGNOSIS — Z6841 Body Mass Index (BMI) 40.0 and over, adult: Secondary | ICD-10-CM | POA: Diagnosis not present

## 2018-07-22 DIAGNOSIS — E1159 Type 2 diabetes mellitus with other circulatory complications: Secondary | ICD-10-CM | POA: Diagnosis present

## 2018-07-22 DIAGNOSIS — E785 Hyperlipidemia, unspecified: Secondary | ICD-10-CM | POA: Diagnosis present

## 2018-07-22 DIAGNOSIS — I248 Other forms of acute ischemic heart disease: Secondary | ICD-10-CM

## 2018-07-22 DIAGNOSIS — F410 Panic disorder [episodic paroxysmal anxiety] without agoraphobia: Secondary | ICD-10-CM | POA: Diagnosis present

## 2018-07-22 DIAGNOSIS — I252 Old myocardial infarction: Secondary | ICD-10-CM | POA: Diagnosis not present

## 2018-07-22 DIAGNOSIS — E1169 Type 2 diabetes mellitus with other specified complication: Secondary | ICD-10-CM | POA: Diagnosis present

## 2018-07-22 LAB — BASIC METABOLIC PANEL
Anion gap: 10 (ref 5–15)
BUN: 57 mg/dL — ABNORMAL HIGH (ref 8–23)
CO2: 33 mmol/L — ABNORMAL HIGH (ref 22–32)
Calcium: 8.9 mg/dL (ref 8.9–10.3)
Chloride: 92 mmol/L — ABNORMAL LOW (ref 98–111)
Creatinine, Ser: 2.22 mg/dL — ABNORMAL HIGH (ref 0.61–1.24)
GFR calc Af Amer: 31 mL/min — ABNORMAL LOW (ref >60)
GFR calc non Af Amer: 26 mL/min — ABNORMAL LOW (ref >60)
Glucose, Bld: 255 mg/dL — ABNORMAL HIGH (ref 70–99)
Potassium: 4.1 mmol/L (ref 3.5–5.1)
Sodium: 135 mmol/L (ref 135–145)

## 2018-07-22 LAB — CBC
HCT: 40.4 % (ref 39.0–52.0)
Hemoglobin: 12.4 g/dL — ABNORMAL LOW (ref 13.0–17.0)
MCH: 28.8 pg (ref 26.0–34.0)
MCHC: 30.7 g/dL (ref 30.0–36.0)
MCV: 93.7 fL (ref 80.0–100.0)
Platelets: 148 10*3/uL — ABNORMAL LOW (ref 150–400)
RBC: 4.31 MIL/uL (ref 4.22–5.81)
RDW: 13.1 % (ref 11.5–15.5)
WBC: 7.2 10*3/uL (ref 4.0–10.5)
nRBC: 0 % (ref 0.0–0.2)

## 2018-07-22 LAB — HEPARIN LEVEL (UNFRACTIONATED)
Heparin Unfractionated: 0.2 IU/mL — ABNORMAL LOW (ref 0.30–0.70)
Heparin Unfractionated: 0.37 IU/mL (ref 0.30–0.70)

## 2018-07-22 LAB — TROPONIN I: Troponin I: 0.76 ng/mL (ref <0.03)

## 2018-07-22 LAB — GLUCOSE, CAPILLARY
Glucose-Capillary: 189 mg/dL — ABNORMAL HIGH (ref 70–99)
Glucose-Capillary: 231 mg/dL — ABNORMAL HIGH (ref 70–99)
Glucose-Capillary: 254 mg/dL — ABNORMAL HIGH (ref 70–99)
Glucose-Capillary: 288 mg/dL — ABNORMAL HIGH (ref 70–99)

## 2018-07-22 MED ORDER — TRAZODONE HCL 50 MG PO TABS
50.0000 mg | ORAL_TABLET | Freq: Once | ORAL | Status: AC
  Administered 2018-07-23: 50 mg via ORAL
  Filled 2018-07-22: qty 1

## 2018-07-22 MED ORDER — METOPROLOL TARTRATE 50 MG PO TABS
50.0000 mg | ORAL_TABLET | Freq: Two times a day (BID) | ORAL | Status: DC
  Administered 2018-07-22 – 2018-07-26 (×8): 50 mg via ORAL
  Filled 2018-07-22 (×8): qty 1

## 2018-07-22 MED ORDER — HYDRALAZINE HCL 10 MG PO TABS
10.0000 mg | ORAL_TABLET | Freq: Three times a day (TID) | ORAL | Status: DC
  Administered 2018-07-22 – 2018-07-26 (×13): 10 mg via ORAL
  Filled 2018-07-22 (×13): qty 1

## 2018-07-22 MED ORDER — METOPROLOL TARTRATE 5 MG/5ML IV SOLN: 5.0000 mg | INTRAVENOUS | Status: DC | PRN

## 2018-07-22 NOTE — Progress Notes (Addendum)
PROGRESS NOTE    [name]  [id number]  DOB: November 03, 1933  DOA: 07/21/2018 PCP: [name], MD   Brief Admission Hx: 85 year old gentleman with coronary artery disease, COPD and CKD stage III, chronic diastolic heart failure who was admitted with chest discomfort and unstable angina.  MDM/Assessment & Plan:   1. Unstable angina -patient remains on IV heparin and had an episode early this morning of chest discomfort with some concerning EKG changes.  He has been maintained on aspirin and Plavix.  His echocardiogram is pending.  Cardiology is following. 2. CAD- he is being treated medically with aspirin and Plavix.  His beta-blocker and Imdur dose has been increased and he is on Nitropaste. 3. Acute on chronic diastolic heart failure- he is on IV Lasix for diuresis.  His urine output has been fairly good and his weight is being closely followed. 4. Stage III 3 CKD-following closely in the setting of diuresis currently holding stable. 5. Type 2 diabetes mellitus, insulin requiring- monitoring CBGs closely and resumed home basal insulin and provide supplemental sliding scale insulin as needed for high blood glucose readings.  Goal BG 140-180. 6. Dyslipidemia-he has a statin intolerance reportedly.  DVT prophylaxis: Heparin infusion Code Status: DNR Family Communication: Son telephone Disposition Plan: INP   Consultants:  cardiology  Procedures:  Echocardiogram IMPRESSIONS  1. Images are limited.  2. The left ventricle had a visually estimated ejection fraction of approximately 40-45%. Unable to assess focal wall motion. The cavity size was normal. There is moderately increased left ventricular wall thickness. Left ventricular diastolic Doppler  parameters are consistent with impaired relaxation.  3. The cavity was mildly enlarged.  4. The aortic valve is tricuspid. Moderate calcification of the aortic valve. Mild to moderate aortic annular calcification noted.  Possible mild aortic stenosis.  5. The mitral valve is grossly normal. There is mild to moderate mitral annular calcification present.  6. The tricuspid valve was grossly normal.  7. The aortic root is normal in size and structure.  Antimicrobials:  n/a   Subjective: Patient says that earlier this morning he had chest discomfort however that has resolved and he is feeling much better this morning.  He denies shortness of breath at this time.  He denies any other complaints.  Objective: Vitals:   07/21/18 1500 07/21/18 1917 07/21/18 2140 07/22/18 0306  BP: (!) 161/74 125/62 (!) 127/51 (!) 110/51  Pulse: 81 68 75 68  Resp: 18 16 16 16   Temp: 97.6 F (36.4 C) 97.8 F (36.6 C) 97.7 F (36.5 C) 98.4 F (36.9 C)  TempSrc: Oral Oral Oral Oral  SpO2: 92% 96% 96% 95%  Weight:      Height:        Intake/Output Summary (Last 24 hours) at 07/22/2018 0930 Last data filed at 07/22/2018 0500 Gross per 24 hour  Intake 876.65 ml  Output 700 ml  Net 176.65 ml   Filed Weights   07/21/18 0833  Weight: 124.7 kg   REVIEW OF SYSTEMS  As per history otherwise all reviewed and reported negative  Exam:  General exam: Awake, alert, sitting up in the bed, chronically ill-appearing, no apparent distress. Respiratory system: Shallow breath sounds bilateral. No increased work of breathing. Cardiovascular system: Normal S1 & S2 heard.  No JVD. Gastrointestinal system: Abdomen is nondistended, soft and nontender. Normal bowel sounds heard. Central nervous system: Alert and oriented. No focal neurological deficits. Extremities: Trace pretibial edema bilateral lower extremities.  Data Reviewed: Basic Metabolic Panel: Recent  Labs  Lab 07/21/18 0835 07/22/18 0309  NA 134* 135  K 4.1 4.1  CL 92* 92*  CO2 29 33*  GLUCOSE 341* 255*  BUN 59* 57*  CREATININE 2.31* 2.22*  CALCIUM 9.4 8.9   Liver Function Tests: No results for input(s): AST, ALT, ALKPHOS, BILITOT, PROT, ALBUMIN in the last 168  hours. No results for input(s): LIPASE, AMYLASE in the last 168 hours. No results for input(s): AMMONIA in the last 168 hours. CBC: Recent Labs  Lab 07/21/18 0835 07/22/18 0309  WBC 8.8 7.2  NEUTROABS 6.2  --   HGB 13.0 12.4*  HCT 40.5 40.4  MCV 91.6 93.7  PLT 155 148*   Cardiac Enzymes: Recent Labs  Lab 07/21/18 0835 07/21/18 1155 07/21/18 1530 07/21/18 2056 07/22/18 0309  TROPONINI 0.13* 0.42* 0.77* 0.91* 0.76*   CBG (last 3)  Recent Labs    07/21/18 1624 07/21/18 2135 07/22/18 0726  GLUCAP 259* 267* 189*   No results found for this or any previous visit (from the past 240 hour(s)).   Studies: Dg Chest Port 1 View  Result Date: 07/21/2018 CLINICAL DATA:  85 year old male with a history of chest pain EXAM: PORTABLE CHEST 1 VIEW COMPARISON:  07/19/2016, 01/17/2015 FINDINGS: Cardiomediastinal silhouette likely unchanged with cardiomegaly. Heart borders partially obscured by soft tissues of the chest wall and lung/pleural disease. Surgical changes of median sternotomy. Patchy opacities at the lung bases in the retrocardiac region with obscuration of the left hemidiaphragm. IMPRESSION: Low lung volumes, with bibasilar opacities, potentially combination of atelectasis, scarring, and/or consolidation. Surgical changes of median sternotomy. Electronically Signed   By: Corrie Mckusick D.O.   On: 07/21/2018 09:16     Scheduled Meds: . allopurinol  100 mg Oral Daily  . alprazolam  1 mg Oral QHS  . aspirin EC  81 mg Oral q morning - 10a  . clopidogrel  75 mg Oral Daily  . diltiazem  240 mg Oral Daily  . furosemide  40 mg Intravenous BID  . insulin aspart  0-15 Units Subcutaneous TID WC  . insulin aspart  0-5 Units Subcutaneous QHS  . insulin glargine  60 Units Subcutaneous BID  . isosorbide mononitrate  30 mg Oral BID  . metoprolol tartrate  37.5 mg Oral BID  . tamsulosin  0.4 mg Oral QPM   Continuous Infusions: . heparin 1,450 Units/hr (07/22/18 0837)   Active Problems:    Essential hypertension, benign   Hyperlipidemia   DM type 2 (diabetes mellitus, type 2) (HCC)   CKD (chronic kidney disease) stage 3, GFR 30-59 ml/min (HCC)   Acute on chronic diastolic heart failure (HCC)   Chest pain   Elevated troponin  Time spent:   Irwin Brakeman, MD [facility] 07/22/2018, 9:30 AM    LOS: 0 days  How to contact the [facility] Attending or Consulting provider [place] or covering provider during after hours [place], for this patient?  1. Check the care team in [facility] and look for a) attending/consulting [facility] provider listed and b) the [facility] team listed 2. Log into [email or web address] and use [facility]'s universal password to access. If you do not have the password, please contact the hospital operator. 3. Locate the [facility] provider you are looking for under [facility] and page to a number that you can be directly reached. 4. If you still have difficulty reaching the provider, please page the [facility] (Director on Call) for the Hospitalists listed on amion for assistance.

## 2018-07-22 NOTE — Progress Notes (Signed)
Progress Note  Patient Name: [name] Date of Encounter: 07/22/2018  Primary Cardiologist: Dr. Satira Sark  Subjective   Did have some mild recurrent chest pain this morning, but states that he feels significantly better than yesterday.  No breathlessness at rest.  No palpitations.  Inpatient Medications    Scheduled Meds: . allopurinol  100 mg Oral Daily  . alprazolam  1 mg Oral QHS  . aspirin EC  81 mg Oral q morning - 10a  . clopidogrel  75 mg Oral Daily  . diltiazem  240 mg Oral Daily  . furosemide  40 mg Intravenous BID  . insulin aspart  0-15 Units Subcutaneous TID WC  . insulin aspart  0-5 Units Subcutaneous QHS  . insulin glargine  60 Units Subcutaneous BID  . isosorbide mononitrate  30 mg Oral BID  . metoprolol tartrate  37.5 mg Oral BID  . tamsulosin  0.4 mg Oral QPM   Continuous Infusions: . heparin 1,450 Units/hr (07/22/18 0837)   PRN Meds: acetaminophen, morphine injection, nitroGLYCERIN, ondansetron (ZOFRAN) IV, polyethylene glycol   Vital Signs    Vitals:   07/21/18 1500 07/21/18 1917 07/21/18 2140 07/22/18 0306  BP: (!) 161/74 125/62 (!) 127/51 (!) 110/51  Pulse: 81 68 75 68  Resp: 18 16 16 16   Temp: 97.6 F (36.4 C) 97.8 F (36.6 C) 97.7 F (36.5 C) 98.4 F (36.9 C)  TempSrc: Oral Oral Oral Oral  SpO2: 92% 96% 96% 95%  Weight:      Height:        Intake/Output Summary (Last 24 hours) at 07/22/2018 1118 Last data filed at 07/22/2018 0900 Gross per 24 hour  Intake 1116.65 ml  Output 700 ml  Net 416.65 ml   Filed Weights   07/21/18 0833  Weight: 124.7 kg    Telemetry    Sinus rhythm.  Personally reviewed.  ECG    Tracing from 07/22/2018 shows possible ectopic atrial rhythm with evidence of old inferior infarct, poor R wave progression anteriorly, and diffuse ST-T wave abnormalities as before.  Personally reviewed.  Physical Exam   GEN:  Morbidly obese male.  No acute distress.   Neck: No JVD. Cardiac: RRR, no  gallop.  Respiratory:  Decreased breath sounds without crackles or wheezing. GI:  Obese, nontender, bowel sounds present. MS:  Chronic appearing lower leg edema. Neuro:  Nonfocal. Psych: Alert and oriented x 3. Normal affect.  Labs    Chemistry Recent Labs  Lab 07/21/18 0835 07/22/18 0309  NA 134* 135  K 4.1 4.1  CL 92* 92*  CO2 29 33*  GLUCOSE 341* 255*  BUN 59* 57*  CREATININE 2.31* 2.22*  CALCIUM 9.4 8.9  GFRNONAA 25* 26*  GFRAA 29* 31*  ANIONGAP 13 10     Hematology Recent Labs  Lab 07/21/18 0835 07/22/18 0309  WBC 8.8 7.2  RBC 4.42 4.31  HGB 13.0 12.4*  HCT 40.5 40.4  MCV 91.6 93.7  MCH 29.4 28.8  MCHC 32.1 30.7  RDW 13.1 13.1  PLT 155 148*    Cardiac Enzymes Recent Labs  Lab 07/21/18 1155 07/21/18 1530 07/21/18 2056 07/22/18 0309  TROPONINI 0.42* 0.77* 0.91* 0.76*   No results for input(s): TROPIPOC in the last 168 hours.   BNP Recent Labs  Lab 07/21/18 0835  BNP 126.0*     Radiology    Dg Chest Port 1 View  Result Date: 07/21/2018 CLINICAL DATA:  85 year old male with a history of chest pain EXAM:  PORTABLE CHEST 1 VIEW COMPARISON:  07/19/2016, 01/17/2015 FINDINGS: Cardiomediastinal silhouette likely unchanged with cardiomegaly. Heart borders partially obscured by soft tissues of the chest wall and lung/pleural disease. Surgical changes of median sternotomy. Patchy opacities at the lung bases in the retrocardiac region with obscuration of the left hemidiaphragm. IMPRESSION: Low lung volumes, with bibasilar opacities, potentially combination of atelectasis, scarring, and/or consolidation. Surgical changes of median sternotomy. Electronically Signed   By: Corrie Mckusick D.O.   On: 07/21/2018 09:16    Cardiac Studies   Echocardiogram 07/21/2018:  1. Images are limited.  2. The left ventricle had a visually estimated ejection fraction of approximately 40-45%. Unable to assess focal wall motion. The cavity size was normal. There is moderately  increased left ventricular wall thickness. Left ventricular diastolic Doppler  parameters are consistent with impaired relaxation.  3. The cavity was mildly enlarged.  4. The aortic valve is tricuspid. Moderate calcification of the aortic valve. Mild to moderate aortic annular calcification noted. Possible mild aortic stenosis.  5. The mitral valve is grossly normal. There is mild to moderate mitral annular calcification present.  6. The tricuspid valve was grossly normal.  7. The aortic root is normal in size and structure.  Patient Profile     85 y.o. male with a history of CAD status post CABG and PCI, chronic diastolic heart failure, hypertension, type 2 diabetes mellitus, COPD, and CKD stage III now presenting with unstable angina and evidence of demand ischemia.  Assessment & Plan    1.  Unstable angina and demand ischemia with peak troponin I up to 0.91.  Chest pain symptoms are generally improved today.  ECG shows no new/acute ST segment abnormalities over prior baseline.  Plan is to manage medically at this time.  2.  Cardiomyopathy, LVEF 40 to 45% range by follow-up echocardiogram.  3.  Sclerotic aortic valve, possibly mild stenosis.  4.  CKD stage III, creatinine 2.2.  5.  Known multivessel CAD status post CABG in 1994 with graft disease and multiple percutaneous interventions over time.  6.  Chronic combined heart failure.  7.  Morbid obesity.  8.  Statin intolerance.  Current cardiac regimen includes aspirin, Plavix, Cardizem CD, Lopressor, and Imdur  Doses of beta-blocker and nitrates were increased yesterday.  He is on IV heparin which I would suggest continuing for a total duration of 48 to 72 hours in the setting of unstable angina.  In light of his cardiomyopathy, I will plan to stop Cardizem CD and uptitrate Lopressor to 50 mg twice daily (this can be changed to Toprol-XL later).  Add hydralazine along with his Imdur.  No ACE inhibitor, ARB, or Aldactone in light of  current degree of renal insufficiency.  Continue IV Lasix for now.  As long as he improves clinically, would focus on optimizing medical regimen, but do not anticipate further ischemic testing at this time since invasive work-up is not planned.  Signed, [name], MD  07/22/2018, 11:18 AM

## 2018-07-22 NOTE — Progress Notes (Signed)
Critical EKG called to on-call MD.  Repeat EKG ordered.

## 2018-07-22 NOTE — Discharge Instructions (Addendum)
PLEASE MONITOR BLOOD SUGARS 4 TIMES PER DAY AND ADJUST INSULIN DOSES AS NEEDED FOR BETTER BLOOD SUGAR CONTROL. PLEASE CHECK BMP IN 1-2 WEEKS TO CHECK KIDNEY FUNCTION AND ELECTROLYTES.   PLEASE MONITOR WEIGHTS CLOSELY AND NOTIFY CARDIOLOGY FOR WEIGHT GAIN MORE THAN 3 POUNDS IN 2 DAYS.       Your current Diabetes A1c level is 11.5% tested on 4/9. Follow up is needed in regards to your insulin regimen with your PCP. We discussed improving on diet at home.   Type 2 Diabetes Mellitus, Self Care, Adult Caring for yourself after you have been diagnosed with type 2 diabetes (type 2 diabetes mellitus) means keeping your blood sugar (glucose) under control with a balance of:  Nutrition.  Exercise.  Lifestyle changes.  Medicines or insulin, if necessary.  Support from your team of health care providers and others. The following information explains what you need to know to manage your diabetes at home. What are the risks? Having diabetes can put you at risk for other long-term (chronic) conditions, such as heart disease and kidney disease. Your health care provider may prescribe medicines to help prevent complications from diabetes. These medicines may include:  Aspirin.  Medicine to lower cholesterol.  Medicine to control blood pressure. How to monitor blood glucose    Check your blood glucose every day, as often as told by your health care provider.  Have your A1c (hemoglobin A1c) level checked two or more times a year, or as often as told by your health care provider. Your health care provider will set individualized treatment goals for you. Generally, the goal of treatment is to maintain the following blood glucose levels:  Before meals (preprandial): 80-130 mg/dL (4.4-7.2 mmol/L).  After meals (postprandial): below 180 mg/dL (10 mmol/L).  A1c level: less than 7%. How to manage hyperglycemia and hypoglycemia Hyperglycemia symptoms Hyperglycemia, also called high blood glucose,  occurs when blood glucose is too high. Make sure you know the early signs of hyperglycemia, such as:  Increased thirst.  Hunger.  Feeling very tired.  Needing to urinate more often than usual.  Blurry vision. Hypoglycemia symptoms Hypoglycemia, also called low blood glucose, occurswith a blood glucose level at or below 70 mg/dL (3.9 mmol/L). The risk for hypoglycemia increases during or after exercise, during sleep, during illness, and when skipping meals or not eating for a long time (fasting). It is important to know the symptoms of hypoglycemia and treat it right away. Always have a 15-gram rapid-acting carbohydrate snack with you to treat low blood glucose. Family members and close friends should also know the symptoms and should understand how to treat hypoglycemia, in case you are not able to treat yourself. Symptoms may include:  Hunger.  Anxiety.  Sweating and feeling clammy.  Confusion.  Dizziness or feeling light-headed.  Sleepiness.  Nausea.  Increased heart rate.  Headache.  Blurry vision.  Irritability.  A change in coordination.  Tingling or numbness around the mouth, lips, or tongue.  Restless sleep.  Fainting.  Seizure. Treating hypoglycemia If you are alert and able to swallow safely, follow the 15:15 rule:  Take 15 grams of a rapid-acting carbohydrate. Talk with your health care provider about how much you should take.  Rapid-acting options include: ? Glucose pills (take 15 grams). ? 6-8 pieces of hard candy. ? 4-6 oz (120-150 mL) of fruit juice. ? 4-6 oz (120-150 mL) of regular (not diet) soda. ? 1 Tbsp (15 mL) honey or sugar.  Check your blood glucose 15  minutes after you take the carbohydrate.  If the repeat blood glucose level is still at or below 70 mg/dL (3.9 mmol/L), take 15 grams of a carbohydrate again.  If your blood glucose level does not increase above 70 mg/dL (3.9 mmol/L) after 3 tries, seek emergency medical  care.  After your blood glucose level returns to normal, eat a meal or a snack within 1 hour. Treating severe hypoglycemia Severe hypoglycemia is when your blood glucose level is at or below 54 mg/dL (3 mmol/L). Severe hypoglycemia is an emergency. Do not wait to see if the symptoms will go away. Get medical help right away. Call your local emergency services (911 in the [place] If you have severe hypoglycemia and you cannot eat or drink, you may need an injection of glucagon. A family member or close friend should learn how to check your blood glucose and how to give you a glucagon injection. Ask your health care provider if you need to have an emergency glucagon injection kit available. Severe hypoglycemia may need to be treated in a hospital. The treatment may include getting glucose through an IV. You may also need treatment for the cause of your hypoglycemia. Follow these instructions at home: Take diabetes medicines as told  If your health care provider prescribed insulin or diabetes medicines, take them every day.  Do not run out of insulin or other diabetes medicines that you take. Plan ahead so you always have these available.  If you use insulin, adjust your dosage based on how physically active you are and what foods you eat. Your health care provider will tell you how to adjust your dosage. Make healthy food choices   The things that you eat and drink affect your blood glucose and your insulin dosage. Making good choices helps to control your diabetes and prevent other health problems. A healthy meal plan includes eating lean proteins, complex carbohydrates, fresh fruits and vegetables, low-fat dairy products, and healthy fats. Make an appointment to see a diet and nutrition specialist (registered dietitian) to help you create an eating plan that is right for you. Make sure that you:  Follow instructions from your health care provider about eating or drinking restrictions.  Drink  enough fluid to keep your urine pale yellow.  Keep a record of the carbohydrates that you eat. Do this by reading food labels and learning the standard serving sizes of foods.  Follow your sick day plan whenever you cannot eat or drink as usual. Make this plan in advance with your health care provider.  Stay active Exercise regularly, as told by your health care provider. This may include:  Stretching and doing strength exercises, such as yoga or weightlifting, 2 or more times a week.  Doing 150 minutes or more of moderate-intensity or vigorous-intensity exercise each week. This could be brisk walking, biking, or water aerobics. ? Spread out your activity over 3 or more days of the week. ? Do not go more than 2 days in a row without doing some kind of physical activity. When you start a new exercise or activity, work with your health care provider to adjust your insulin, medicines, or food intake as needed. Make healthy lifestyle choices  Do not use any tobacco products, such as cigarettes, chewing tobacco, and e-cigarettes. If you need help quitting, ask your health care provider.  If your health care provider says that alcohol is safe for you, limit alcohol intake to no more than 1 drink per day for nonpregnant  women and 2 drinks per day for men. One drink equals 12 oz of beer (355 mL), 5 oz of wine (148 mL), or 1 oz of hard liquor (44 mL).  Learn to manage stress. If you need help with this, ask your health care provider. Care for your body    Keep your immunizations up to date. In addition to getting vaccinations as told by your health care provider, it is recommended that you get vaccinated against the following illnesses: ? The flu (influenza). Get a flu shot every year. ? Pneumonia. ? Hepatitis B.  Schedule an eye exam soon after your diagnosis, and then one time every year after that.  Check your skin and feet every day for cuts, bruises, redness, blisters, or sores.  Schedule a foot exam with your health care provider once every year.  Brush your teeth and gums two times a day, and floss one or more times a day. Visit your dentist one or more times every 6 months.  Maintain a healthy weight. General instructions  Take over-the-counter and prescription medicines only as told by your health care provider.  Share your diabetes management plan with people in your workplace, school, and household.  Carry a medical alert card or wear medical alert jewelry.  Keep all follow-up visits as told by your health care provider. This is important. Questions to ask your health care provider  Do I need to meet with a diabetes educator?  Where can I find a support group for people with diabetes? Where to find more information For more information about diabetes, visit:  American Diabetes Association (ADA): www.diabetes.org  American Association of Diabetes Educators (AADE): www.diabeteseducator.org Summary  Caring for yourself after you have been diagnosed with (type 2 diabetes mellitus) means keeping your blood sugar (glucose) under control with a balance of nutrition, exercise, lifestyle changes, and medicine.  Check your blood glucose every day, as often as told by your health care provider.  Having diabetes can put you at risk for other long-term (chronic) conditions, such as heart disease and kidney disease. Your health care provider may prescribe medicines to help prevent complications from diabetes.  Keep all follow-up visits as told by your health care provider. This is important. This information is not intended to replace advice given to you by your health care provider. Make sure you discuss any questions you have with your health care provider. Document Released: 07/22/2015 Document Revised: 09/20/2017 Document Reviewed: 05/03/2015 Elsevier Interactive Patient Education  2019 [employer].     Angina Angina is a very bad discomfort or pain in  the chest, neck, or arm. Angina may cause the following symptoms in your chest:  Crushing or squeezing pain. Pain might last for more than a few minutes at a time. Or, it may stop and come back (recur) over a few minutes.  Tightness.  Fullness.  Pressure.  Heaviness. Some people also have:  Pain in the arms, neck, jaw, or back.  Heartburn or indigestion for no reason.  Shortness of breath.  An upset stomach (nausea).  Sudden cold sweats. Women and people with diabetes may have other less common symptoms such as:  Feeling tired (fatigue).  Feeling nervous or worried for no reason.  Feeling weak for no reason.  Dizziness or fainting. Follow these instructions at home: Medicines  Take over-the-counter and prescription medicines only as told by your doctor.  Do not take these medicines unless your doctor says that you can: ? NSAIDs. These include:  Ibuprofen.  Naproxen.  Celecoxib. ? Vitamin supplements that have vitamin A, vitamin E, or both. ? Hormone therapy that contains estrogen with or without progestin. Eating and drinking   Eat a heart-healthy diet that includes: ? Plenty of fresh fruits and vegetables. ? Whole grains. ? Lowfat (lean) protein. ? Lowfat dairy products.  Work with a diet and nutrition specialist (dietitian) as told by your doctor. This person can help you make healthy food choices.  Follow other instructions from your doctor about eating or drinking restrictions. Activity  Follow an exercise program that your doctor tells you.  Return to your normal activities as told by your doctor. Ask your doctor what activities are safe for you.  When you feel tired, take a break. Plan breaks if you know you are going to feel tired. Lifestyle   Do not use any products that contain nicotine or tobacco. This includes cigarettes and e-cigarettes. If you need help quitting, ask your doctor.  If your doctor says you can drink alcohol, limit your  drinking to: ? 0-1 drink a day for women. ? 0-2 drinks a day for men.  Be aware of how much alcohol is in your drink. In the [place] 1 drink equals to:  12 oz of beer.  5 oz of wine.  1 oz of hard liquor. General instructions  Stay at a healthy weight. If your doctor tells you to do so, work with him or her to lose weight.  Learn to deal with stress. If you need help, ask your doctor.  Take a depression screening test to see if you are at risk for depression. If you feel depressed, talk with your doctor.  Work with your doctor to manage any other health conditions that you have. These may include diabetes or high blood pressure.  Keep your vaccines up to date. Get a flu shot every year.  Keep all follow-up visits as told by your doctor. This is important. Get help right away if:  You have pain in your chest, neck, arm, jaw, stomach, or back, and the pain: ? Lasts more than a few minutes. ? Comes back. ? Is very painful. ? Comes more often. ? Does not get better after you take medicine under your tongue (sublingual nitroglycerin).  You have any of these problems for no reason: ? Heartburn, or indigestion. ? Sweating a lot. ? Shortness of breath. ? Trouble breathing. ? Feeling sick to your stomach. ? Throwing up. ? Feeling more tired than usual. ? Feeling nervous or worrying more than usual. ? Weakness. ? Watery poop (diarrhea).  You are suddenly dizzy or light-headed.  You pass out (faint). These symptoms may be an emergency. Do not wait to see if the symptoms will go away. Get medical help right away. Call your local emergency services (911 in the [place] Do not drive yourself to the hospital. Summary  Angina is very bad discomfort or pain in the chest, neck, or arm.  Angina may feel like a crushing or squeezing pain in the chest. It may feel like tightness, pressure, fullness, or heaviness in the chest.  Women or people with diabetes may have different symptoms  from men, such as feeling nervous, being worried, being weak for no reason, or feeling tired.  Take medicines only as told by your doctor.  You should eat a heart-healthy diet and follow an exercise program. This information is not intended to replace advice given to you by your health care provider. Make sure you discuss any  questions you have with your health care provider. Document Released: 09/16/2007 Document Revised: 05/14/2017 Document Reviewed: 05/14/2017 Elsevier Interactive Patient Education  2019 [employer]     IMPORTANT INFORMATION: PAY CLOSE ATTENTION   PHYSICIAN DISCHARGE INSTRUCTIONS  Follow with Primary care provider  Sasser, Silvestre Moment, MD  and other consultants as instructed your Hospitalist Physician  SEEK MEDICAL CARE OR RETURN TO EMERGENCY ROOM IF SYMPTOMS COME BACK, WORSEN OR NEW PROBLEM DEVELOPS.   Please note: You were cared for by a hospitalist during your hospital stay. Every effort will be made to forward records to your primary care provider.  You can request that your primary care provider send for your hospital records if they have not received them.  Once you are discharged, your primary care physician will handle any further medical issues. Please note that NO REFILLS for any discharge medications will be authorized once you are discharged, as it is imperative that you return to your primary care physician (or establish a relationship with a primary care physician if you do not have one) for your post hospital discharge needs so that they can reassess your need for medications and monitor your lab values.  Please get a complete blood count and chemistry panel checked by your Primary MD at your next visit, and again as instructed by your Primary MD.  Get Medicines reviewed and adjusted: Please take all your medications with you for your next visit with your Primary MD  Laboratory/radiological data: Please request your Primary MD to go over all hospital  tests and procedure/radiological results at the follow up, please ask your primary care provider to get all Hospital records sent to his/her office.  In some cases, they will be blood work, cultures and biopsy results pending at the time of your discharge. Please request that your primary care provider follow up on these results.  If you are diabetic, please bring your blood sugar readings with you to your follow up appointment with primary care.    Please call and make your follow up appointments as soon as possible.    Also Note the following: If you experience worsening of your admission symptoms, develop shortness of breath, life threatening emergency, suicidal or homicidal thoughts you must seek medical attention immediately by calling 911 or calling your MD immediately  if symptoms less severe.  You must read complete instructions/literature along with all the possible adverse reactions/side effects for all the Medicines you take and that have been prescribed to you. Take any new Medicines after you have completely understood and accpet all the possible adverse reactions/side effects.   Do not drive when taking Pain medications or sleeping medications (Benzodiazepines)  Do not take more than prescribed Pain, Sleep and Anxiety Medications. It is not advisable to combine anxiety,sleep and pain medications without talking with your primary care practitioner  Special Instructions: If you have smoked or chewed Tobacco  in the last 2 yrs please stop smoking, stop any regular Alcohol  and or any Recreational drug use.  Wear Seat belts while driving.

## 2018-07-22 NOTE — Progress Notes (Signed)
ANTICOAGULATION CONSULT NOTE - Initial Consult  Pharmacy Consult for heparin gtt  Indication: chest pain/ACS  Allergies  Allergen Reactions  . Zocor [Simvastatin] Other (See Comments)    fatigue  . Lipitor [Atorvastatin] Other (See Comments)    Muscle cramping    Patient Measurements: Height: 5\' 5"  (165.1 cm) Weight: 275 lb (124.7 kg) IBW/kg (Calculated) : 61.5 Heparin Dosing Weight: HEPARIN DW (KG): 91.2   Vital Signs: Temp: 98.6 F (37 C) (04/10 1346) Temp Source: Oral (04/10 1346) BP: 148/58 (04/10 1346) Pulse Rate: 71 (04/10 1346)  Labs: Recent Labs    07/21/18 0835  07/21/18 1530 07/21/18 1845 07/21/18 2056 07/22/18 0309 07/22/18 1331  HGB 13.0  --   --   --   --  12.4*  --   HCT 40.5  --   --   --   --  40.4  --   PLT 155  --   --   --   --  148*  --   LABPROT 14.0  --   --   --   --   --   --   INR 1.1  --   --   --   --   --   --   HEPARINUNFRC  --   --   --  0.19*  --  0.20* 0.37  CREATININE 2.31*  --   --   --   --  2.22*  --   TROPONINI 0.13*   < > 0.77*  --  0.91* 0.76*  --    < > = values in this interval not displayed.    Estimated Creatinine Clearance: 31 mL/min (A) (by C-G formula based on SCr of 2.22 mg/dL (H)).   Medical History: Past Medical History:  Diagnosis Date  . Allergic rhinitis   . Anxiety   . Arthritis   . COPD (chronic obstructive pulmonary disease) ([place])   . Coronary atherosclerosis of native coronary artery    a. CABG x 4 in 1994. Previous stent placement to SVG to distal RCA;  b. DES to native Cx in 2006;  c. DES SVG to RCA 05/08/11; d. Inf MI/Cath/PCI: LM 25, LAD 100, LCX patent stent, 50 into OM, RI 60-70, RCA 100, LIMA->LAD nl, VG->Diag 100 old, VG->RCA/PL 99 (DES x 1), EF 50-55%.  . Depression    With anxious features/hx panic attacks  . Essential hypertension, benign   . GERD (gastroesophageal reflux disease)   . Hyperlipidemia    Not on statin 2/2 hx of intolerance.   . Morbid obesity ([place])   . Nephrolithiasis    . Pneumonia   . Sleep apnea   . ST elevation myocardial infarction (STEMI) of inferior wall ([place])    4/14 - DES SVG to PDA  . Type 2 diabetes mellitus (HCC)     Medications:  Medications Prior to Admission  Medication Sig Dispense Refill Last Dose  . allopurinol (ZYLOPRIM) 100 MG tablet Take 1 tablet (100 mg total) by mouth daily. 30 tablet 2 Past Week at Unknown time  . alprazolam (XANAX) 2 MG tablet Take 1 mg by mouth at bedtime.    Past Week at Unknown time  . aspirin EC 81 MG tablet Take 81 mg by mouth every morning.   Past Week at Unknown time  . clopidogrel (PLAVIX) 75 MG tablet take 1 tablet by mouth once daily WITH BREAKFAST 90 tablet 3 07/18/2018 at 700  . cyclobenzaprine (FLEXERIL) 10 MG tablet Take 10 mg by mouth  at bedtime.    Past Week at Unknown time  . diltiazem (CARDIZEM CD) 240 MG 24 hr capsule Take 240 mg by mouth daily.   Past Week at Unknown time  . metolazone (ZAROXOLYN) 2.5 MG tablet TAKE 1 TABLET ONCE DAILY FOR 2 TO 3 DAYS AS NEEDED ONLY FOR WEIGHT GAIN OF 3 TO 5 LBS IN A 48 HR PERIOD 15 tablet 1 Past Week at Unknown time  . metoprolol tartrate (LOPRESSOR) 25 MG tablet TAKE 1 TABLET BY MOUTH TWICE A DAY 180 tablet 1 07/18/2018 at 2000  . nitroGLYCERIN (NITROLINGUAL) 0.4 MG/SPRAY spray use 1-2 sprays under the tongue if needed for chest pain *MAXIMUM OF 3 SPRAYS IN 15 MINUTES 25 g 3 Taking  . polyethylene glycol powder (GLYCOLAX/MIRALAX) powder Take 17 g by mouth daily as needed (constipation).    Taking  . psyllium (METAMUCIL) 58.6 % powder Take by mouth every morning.   Past Week at Unknown time  . tamsulosin (FLOMAX) 0.4 MG CAPS capsule Take 0.4 mg by mouth every evening.    Past Week at Unknown time  . torsemide (DEMADEX) 20 MG tablet Take Two Tablets ( 40 mg )  Daily Alternating with One Tablet (20 mg) Daily 45 tablet 6 Past Week at Unknown time  . TOUJEO SOLOSTAR 300 UNIT/ML SOPN inject 60 units BID  0 Past Week at Unknown time  . isosorbide mononitrate (IMDUR) 30 MG  24 hr tablet Take 1 tablet (30 mg total) by mouth daily. 90 tablet 3 Taking   Scheduled:  . allopurinol  100 mg Oral Daily  . alprazolam  1 mg Oral QHS  . aspirin EC  81 mg Oral q morning - 10a  . clopidogrel  75 mg Oral Daily  . furosemide  40 mg Intravenous BID  . hydrALAZINE  10 mg Oral Q8H  . insulin aspart  0-15 Units Subcutaneous TID WC  . insulin aspart  0-5 Units Subcutaneous QHS  . insulin glargine  60 Units Subcutaneous BID  . isosorbide mononitrate  30 mg Oral BID  . metoprolol tartrate  50 mg Oral BID  . tamsulosin  0.4 mg Oral QPM   Infusions:  . heparin 1,450 Units/hr (07/22/18 0837)   PRN: acetaminophen, morphine injection, nitroGLYCERIN, ondansetron (ZOFRAN) IV, polyethylene glycol Anti-infectives (From admission, onward)   None      Assessment: [name] a 85 y.o. male requires anticoagulation with a heparin iv infusion for the indication of  chest pain/ACS. Heparin gtt will be started following pharmacy protocol per pharmacy consult.  Goal of Therapy:  Heparin level 0.3-0.7 units/ml Monitor platelets by anticoagulation protocol: Yes   Plan:  Continue heparin infusion 1450 units per hour Check anti-Xa level  daily while on heparin Continue to monitor H&H and platelets    Donna Christen Saleh Ulbrich 07/22/2018,2:32 PM

## 2018-07-22 NOTE — Progress Notes (Signed)
Patient did not actually have a foley. He just had a condemn cath.

## 2018-07-22 NOTE — Progress Notes (Signed)
Patient ID: [name], male   DOB: 09-09-1933, 85 y.o.   MRN: [id number]  Gillian Scarce by RN for abnormal EKG Pt denies cp, palp, sob, n/v  Exam:  Afebrile T 98.4 P 68  R 16, Bp 110/51 pox 95% Heent: anicteric Neck: no jvd Heart: rrr s1, s2 2/6 sem rusb Lung: ctab Abd obese Ext: no c/c/e  ekg NSR 63, lad, q3, q avf, ST Elevation in 3, avf, t inversion in 1, avl, v2, v4-6 appears new.   A/P ACS, new, ischemia in the lateral leads Cont aspirin, plavix, heparin iv Cont Imdur No statin due to allergy Notified cardiology on call of new EKG changes Troponin trend stable Defer to cardiology if needs catheterization

## 2018-07-22 NOTE — Progress Notes (Signed)
ANTICOAGULATION CONSULT NOTE - Initial Consult  Pharmacy Consult for heparin gtt  Indication: chest pain/ACS  Allergies  Allergen Reactions  . Zocor [Simvastatin] Other (See Comments)    fatigue  . Lipitor [Atorvastatin] Other (See Comments)    Muscle cramping    Patient Measurements: Height: 5\' 5"  (165.1 cm) Weight: 275 lb (124.7 kg) IBW/kg (Calculated) : 61.5 Heparin Dosing Weight: HEPARIN DW (KG): 91.2   Vital Signs: Temp: 98.4 F (36.9 C) (04/10 0306) Temp Source: Oral (04/10 0306) BP: 110/51 (04/10 0306) Pulse Rate: 68 (04/10 0306)  Labs: Recent Labs    07/21/18 0835  07/21/18 1530 07/21/18 1845 07/21/18 2056 07/22/18 0309  HGB 13.0  --   --   --   --  12.4*  HCT 40.5  --   --   --   --  40.4  PLT 155  --   --   --   --  148*  LABPROT 14.0  --   --   --   --   --   INR 1.1  --   --   --   --   --   HEPARINUNFRC  --   --   --  0.19*  --  0.20*  CREATININE 2.31*  --   --   --   --  2.22*  TROPONINI 0.13*   < > 0.77*  --  0.91* 0.76*   < > = values in this interval not displayed.    Estimated Creatinine Clearance: 31 mL/min (A) (by C-G formula based on SCr of 2.22 mg/dL (H)).   Medical History: Past Medical History:  Diagnosis Date  . Allergic rhinitis   . Anxiety   . Arthritis   . COPD (chronic obstructive pulmonary disease) ([place])   . Coronary atherosclerosis of native coronary artery    a. CABG x 4 in 1994. Previous stent placement to SVG to distal RCA;  b. DES to native Cx in 2006;  c. DES SVG to RCA 05/08/11; d. Inf MI/Cath/PCI: LM 25, LAD 100, LCX patent stent, 50 into OM, RI 60-70, RCA 100, LIMA->LAD nl, VG->Diag 100 old, VG->RCA/PL 99 (DES x 1), EF 50-55%.  . Depression    With anxious features/hx panic attacks  . Essential hypertension, benign   . GERD (gastroesophageal reflux disease)   . Hyperlipidemia    Not on statin 2/2 hx of intolerance.   . Morbid obesity ([place])   . Nephrolithiasis   . Pneumonia   . Sleep apnea   . ST elevation  myocardial infarction (STEMI) of inferior wall ([place])    4/14 - DES SVG to PDA  . Type 2 diabetes mellitus (HCC)     Medications:  Medications Prior to Admission  Medication Sig Dispense Refill Last Dose  . allopurinol (ZYLOPRIM) 100 MG tablet Take 1 tablet (100 mg total) by mouth daily. 30 tablet 2 Past Week at Unknown time  . alprazolam (XANAX) 2 MG tablet Take 1 mg by mouth at bedtime.    Past Week at Unknown time  . aspirin EC 81 MG tablet Take 81 mg by mouth every morning.   Past Week at Unknown time  . clopidogrel (PLAVIX) 75 MG tablet take 1 tablet by mouth once daily WITH BREAKFAST 90 tablet 3 07/18/2018 at 700  . cyclobenzaprine (FLEXERIL) 10 MG tablet Take 10 mg by mouth at bedtime.    Past Week at Unknown time  . diltiazem (CARDIZEM CD) 240 MG 24 hr capsule Take 240 mg by  mouth daily.   Past Week at Unknown time  . metolazone (ZAROXOLYN) 2.5 MG tablet TAKE 1 TABLET ONCE DAILY FOR 2 TO 3 DAYS AS NEEDED ONLY FOR WEIGHT GAIN OF 3 TO 5 LBS IN A 48 HR PERIOD 15 tablet 1 Past Week at Unknown time  . metoprolol tartrate (LOPRESSOR) 25 MG tablet TAKE 1 TABLET BY MOUTH TWICE A DAY 180 tablet 1 07/18/2018 at 2000  . nitroGLYCERIN (NITROLINGUAL) 0.4 MG/SPRAY spray use 1-2 sprays under the tongue if needed for chest pain *MAXIMUM OF 3 SPRAYS IN 15 MINUTES 25 g 3 Taking  . polyethylene glycol powder (GLYCOLAX/MIRALAX) powder Take 17 g by mouth daily as needed (constipation).    Taking  . psyllium (METAMUCIL) 58.6 % powder Take by mouth every morning.   Past Week at Unknown time  . tamsulosin (FLOMAX) 0.4 MG CAPS capsule Take 0.4 mg by mouth every evening.    Past Week at Unknown time  . torsemide (DEMADEX) 20 MG tablet Take Two Tablets ( 40 mg )  Daily Alternating with One Tablet (20 mg) Daily 45 tablet 6 Past Week at Unknown time  . TOUJEO SOLOSTAR 300 UNIT/ML SOPN inject 60 units BID  0 Past Week at Unknown time  . isosorbide mononitrate (IMDUR) 30 MG 24 hr tablet Take 1 tablet (30 mg total) by  mouth daily. 90 tablet 3 Taking   Scheduled:  . allopurinol  100 mg Oral Daily  . alprazolam  1 mg Oral QHS  . aspirin EC  81 mg Oral q morning - 10a  . clopidogrel  75 mg Oral Daily  . diltiazem  240 mg Oral Daily  . furosemide  40 mg Intravenous BID  . insulin aspart  0-15 Units Subcutaneous TID WC  . insulin aspart  0-5 Units Subcutaneous QHS  . insulin glargine  60 Units Subcutaneous BID  . isosorbide mononitrate  30 mg Oral BID  . metoprolol tartrate  37.5 mg Oral BID  . tamsulosin  0.4 mg Oral QPM   Infusions:  . heparin 1,200 Units/hr (07/22/18 0300)   PRN: acetaminophen, morphine injection, nitroGLYCERIN, ondansetron (ZOFRAN) IV, polyethylene glycol Anti-infectives (From admission, onward)   None      Assessment: [name] a 85 y.o. male requires anticoagulation with a heparin iv infusion for the indication of  chest pain/ACS. Heparin gtt will be started following pharmacy protocol per pharmacy consult. Patient is not on previous oral anticoagulant that will require aPTT/HL correlation before transitioning to only HL monitoring. Subtherapeutic HL of 0.20  Goal of Therapy:  Heparin level 0.3-0.7 units/ml Monitor platelets by anticoagulation protocol: Yes   Plan:  Increase heparin infusion from 1200 units/hr to 1450 units per hour Check anti-Xa level in 8 hours and daily while on heparin Continue to monitor H&H and platelets  Heparin level to be drawn in 8 hours for patients >6 years old or crcl < 51ml/min  [name] 07/22/2018,7:34 AM

## 2018-07-23 LAB — RENAL FUNCTION PANEL
Albumin: 3.8 g/dL (ref 3.5–5.0)
Anion gap: 12 (ref 5–15)
BUN: 55 mg/dL — ABNORMAL HIGH (ref 8–23)
CO2: 33 mmol/L — ABNORMAL HIGH (ref 22–32)
Calcium: 8.7 mg/dL — ABNORMAL LOW (ref 8.9–10.3)
Chloride: 89 mmol/L — ABNORMAL LOW (ref 98–111)
Creatinine, Ser: 2.16 mg/dL — ABNORMAL HIGH (ref 0.61–1.24)
GFR calc Af Amer: 32 mL/min — ABNORMAL LOW (ref >60)
GFR calc non Af Amer: 27 mL/min — ABNORMAL LOW (ref >60)
Glucose, Bld: 208 mg/dL — ABNORMAL HIGH (ref 70–99)
Phosphorus: 2.2 mg/dL — ABNORMAL LOW (ref 2.5–4.6)
Potassium: 3.8 mmol/L (ref 3.5–5.1)
Sodium: 134 mmol/L — ABNORMAL LOW (ref 135–145)

## 2018-07-23 LAB — GLUCOSE, CAPILLARY
Glucose-Capillary: 183 mg/dL — ABNORMAL HIGH (ref 70–99)
Glucose-Capillary: 301 mg/dL — ABNORMAL HIGH (ref 70–99)
Glucose-Capillary: 280 mg/dL — ABNORMAL HIGH (ref 70–99)
Glucose-Capillary: 258 mg/dL — ABNORMAL HIGH (ref 70–99)

## 2018-07-23 LAB — HEPARIN LEVEL (UNFRACTIONATED): Heparin Unfractionated: 0.38 IU/mL (ref 0.30–0.70)

## 2018-07-23 MED ORDER — ALPRAZOLAM 0.5 MG PO TABS
0.5000 mg | ORAL_TABLET | Freq: Three times a day (TID) | ORAL | Status: DC | PRN
  Administered 2018-07-23 – 2018-07-26 (×8): 0.5 mg via ORAL
  Filled 2018-07-23 (×8): qty 1

## 2018-07-23 NOTE — Progress Notes (Signed)
PROGRESS NOTE  [name]  SVX:[id number]  DOB: 24-Apr-1933  DOA: 07/21/2018 PCP: [name], MD   Brief Admission Hx: 85 year old gentleman with coronary artery disease, chronic systolic and diastolic heart failure, COPD and stage III CKD presented with chest discomfort and unstable angina.  MDM/Assessment & Plan:   1. Unstable angina-patient reports that his chest discomfort is improving, he remains on IV heparin which will continue for an additional day and discontinue tomorrow.  He is managed medically with aspirin and Plavix.  He was seen by the cardiology service and they do not plan any invasive testing or further ischemic work-up at this time. 2. Coronary artery disease- he has been medically manage with aspirin and Plavix, he is on Imdur and hydralazine.  His chest pain symptoms have improved. 3. Acute on chronic systolic/diastolic heart failure- continue IV Lasix for diuresis, plan to transition to oral home torsemide in the next 1 to 2 days.  He is diuresing fairly well.  His weight is being followed closely.  He is feeling better. 4. Stage III CKD- his creatinine is holding fairly steady and will continue to follow in the setting of IV diuresis. 5. Type 2 diabetes mellitus, insulin requiring with vascular complications- his CBG testing and results have been fairly well controlled on basal insulin with supplemental sliding scale coverage and prandial coverage.  He is BG goal remains 140-180. 6. Diabetic dyslipidemia- he reportedly has a statin intolerance.  Follow-up with outpatient cardiology.  DVT prophylaxis: Heparin infusion Code Status: DNR Family Communication: Spoke with son on telephone and asked him to share with other family members Disposition Plan: Home in the next 1-2 days   Consultants:  Inpatient cardiology  Procedures:  Echocardiogram IMPRESSIONS 1. Images are limited. 2. The left ventricle had a visually estimated ejection fraction of  approximately 40-45%. Unable to assess focal wall motion. The cavity size was normal. There is moderately increased left ventricular wall thickness. Left ventricular diastolic Doppler  parameters are consistent with impaired relaxation. 3. The cavity was mildly enlarged. 4. The aortic valve is tricuspid. Moderate calcification of the aortic valve. Mild to moderate aortic annular calcification noted. Possible mild aortic stenosis. 5. The mitral valve is grossly normal. There is mild to moderate mitral annular calcification present. 6. The tricuspid valve was grossly normal. 7. The aortic root is normal in size and structure  Antimicrobials:  N/A  Subjective: Patient says he is feeling much better no chest pain symptoms no shortness of breath symptoms and he is starting to ask about going home.  He does have a lot of anxiety and has had aches and pains but seem to be controlled with as needed medications.  He says that he is not sleeping well in the hospital.  Objective: Vitals:   07/22/18 2106 07/23/18 0032 07/23/18 0450 07/23/18 0830  BP: (!) 170/97 138/68 (!) 143/70 (!) 150/66  Pulse: (!) 118 88 86 (!) 108  Resp: 20 16 16 17   Temp: 97.9 F (36.6 C) 98.8 F (37.1 C) 97.9 F (36.6 C) 98.1 F (36.7 C)  TempSrc: Oral Oral Oral Oral  SpO2: 95% 93% 93% 93%  Weight:      Height:        Intake/Output Summary (Last 24 hours) at 07/23/2018 0858 Last data filed at 07/23/2018 0500 Gross per 24 hour  Intake 480 ml  Output 2625 ml  Net -2145 ml   Filed Weights   07/21/18 0833  Weight: 124.7 kg  REVIEW OF SYSTEMS  As per history otherwise all reviewed and reported negative  Exam:  General exam: Elderly male he is chronically ill-appearing in no apparent distress he is appropriate and cooperative. Respiratory system: Clear. No increased work of breathing. Cardiovascular system: Normal S1 & S2 heard.  Gastrointestinal system: Abdomen is nondistended, soft and nontender.  Normal bowel sounds heard. Central nervous system: Alert and oriented. No focal neurological deficits. Extremities: no cyanosis or clubbing, trace pretibial edema bilateral lower extremities.  Data Reviewed: Basic Metabolic Panel: Recent Labs  Lab 07/21/18 0835 07/22/18 0309  NA 134* 135  K 4.1 4.1  CL 92* 92*  CO2 29 33*  GLUCOSE 341* 255*  BUN 59* 57*  CREATININE 2.31* 2.22*  CALCIUM 9.4 8.9   Liver Function Tests: No results for input(s): AST, ALT, ALKPHOS, BILITOT, PROT, ALBUMIN in the last 168 hours. No results for input(s): LIPASE, AMYLASE in the last 168 hours. No results for input(s): AMMONIA in the last 168 hours. CBC: Recent Labs  Lab 07/21/18 0835 07/22/18 0309  WBC 8.8 7.2  NEUTROABS 6.2  --   HGB 13.0 12.4*  HCT 40.5 40.4  MCV 91.6 93.7  PLT 155 148*   Cardiac Enzymes: Recent Labs  Lab 07/21/18 0835 07/21/18 1155 07/21/18 1530 07/21/18 2056 07/22/18 0309  TROPONINI 0.13* 0.42* 0.77* 0.91* 0.76*   CBG (last 3)  Recent Labs    07/22/18 1610 07/22/18 2108 07/23/18 0724  GLUCAP 254* 288* 183*   No results found for this or any previous visit (from the past 240 hour(s)).   Studies: Dg Chest Port 1 View  Result Date: 07/21/2018 CLINICAL DATA:  85 year old male with a history of chest pain EXAM: PORTABLE CHEST 1 VIEW COMPARISON:  07/19/2016, 01/17/2015 FINDINGS: Cardiomediastinal silhouette likely unchanged with cardiomegaly. Heart borders partially obscured by soft tissues of the chest wall and lung/pleural disease. Surgical changes of median sternotomy. Patchy opacities at the lung bases in the retrocardiac region with obscuration of the left hemidiaphragm. IMPRESSION: Low lung volumes, with bibasilar opacities, potentially combination of atelectasis, scarring, and/or consolidation. Surgical changes of median sternotomy. Electronically Signed   By: Corrie Mckusick D.O.   On: 07/21/2018 09:16     Scheduled Meds:  allopurinol  100 mg Oral Daily    aspirin EC  81 mg Oral q morning - 10a   clopidogrel  75 mg Oral Daily   furosemide  40 mg Intravenous BID   hydrALAZINE  10 mg Oral Q8H   insulin aspart  0-15 Units Subcutaneous TID WC   insulin aspart  0-5 Units Subcutaneous QHS   insulin glargine  60 Units Subcutaneous BID   isosorbide mononitrate  30 mg Oral BID   metoprolol tartrate  50 mg Oral BID   tamsulosin  0.4 mg Oral QPM   Continuous Infusions:  heparin 1,450 Units/hr (07/23/18 0157)    Active Problems:   Coronary artery disease involving native coronary artery of native heart with unstable angina pectoris (HCC)   Essential hypertension, benign   Hyperlipidemia   DM type 2 (diabetes mellitus, type 2) (HCC)   CKD (chronic kidney disease) stage 3, GFR 30-59 ml/min (HCC)   Acute on chronic diastolic heart failure (HCC)   Chest pain   Elevated troponin   Unstable angina ([place])   Demand ischemia ([place])   Secondary cardiomyopathy ([place])   Time spent:   Irwin Brakeman, MD [facility] 07/23/2018, 8:58 AM    LOS: 1 day  How to contact the [facility] Attending  or Consulting provider [place] or covering provider during after hours [place], for this patient?  1. Check the care team in [facility] and look for a) attending/consulting [facility] provider listed and b) the [facility] team listed 2. Log into [email or web address] and use 's universal password to access. If you do not have the password, please contact the hospital operator. 3. Locate the [facility] provider you are looking for under [facility] and page to a number that you can be directly reached. 4. If you still have difficulty reaching the provider, please page the [facility] (Director on Call) for the Hospitalists listed on amion for assistance.

## 2018-07-23 NOTE — Progress Notes (Signed)
ANTICOAGULATION CONSULT NOTE - Initial Consult  Pharmacy Consult for heparin gtt  Indication: chest pain/ACS  Allergies  Allergen Reactions  . Zocor [Simvastatin] Other (See Comments)    fatigue  . Lipitor [Atorvastatin] Other (See Comments)    Muscle cramping    Patient Measurements: Height: 5\' 5"  (165.1 cm) Weight: 275 lb (124.7 kg) IBW/kg (Calculated) : 61.5 Heparin Dosing Weight: HEPARIN DW (KG): 91.2   Vital Signs: Temp: 97.9 F (36.6 C) (04/11 0450) Temp Source: Oral (04/11 0450) BP: 143/70 (04/11 0450) Pulse Rate: 86 (04/11 0450)  Labs: Recent Labs    07/21/18 0835  07/21/18 1530  07/21/18 2056 07/22/18 0309 07/22/18 1331 07/23/18 0647  HGB 13.0  --   --   --   --  12.4*  --   --   HCT 40.5  --   --   --   --  40.4  --   --   PLT 155  --   --   --   --  148*  --   --   LABPROT 14.0  --   --   --   --   --   --   --   INR 1.1  --   --   --   --   --   --   --   HEPARINUNFRC  --   --   --    < >  --  0.20* 0.37 0.38  CREATININE 2.31*  --   --   --   --  2.22*  --   --   TROPONINI 0.13*   < > 0.77*  --  0.91* 0.76*  --   --    < > = values in this interval not displayed.    Estimated Creatinine Clearance: 31 mL/min (A) (by C-G formula based on SCr of 2.22 mg/dL (H)).   Medical History: Past Medical History:  Diagnosis Date  . Allergic rhinitis   . Anxiety   . Arthritis   . COPD (chronic obstructive pulmonary disease) ([place])   . Coronary atherosclerosis of native coronary artery    a. CABG x 4 in 1994. Previous stent placement to SVG to distal RCA;  b. DES to native Cx in 2006;  c. DES SVG to RCA 05/08/11; d. Inf MI/Cath/PCI: LM 25, LAD 100, LCX patent stent, 50 into OM, RI 60-70, RCA 100, LIMA->LAD nl, VG->Diag 100 old, VG->RCA/PL 99 (DES x 1), EF 50-55%.  . Depression    With anxious features/hx panic attacks  . Essential hypertension, benign   . GERD (gastroesophageal reflux disease)   . Hyperlipidemia    Not on statin 2/2 hx of intolerance.   .  Morbid obesity ([place])   . Nephrolithiasis   . Pneumonia   . Sleep apnea   . ST elevation myocardial infarction (STEMI) of inferior wall ([place])    4/14 - DES SVG to PDA  . Type 2 diabetes mellitus (HCC)     Medications:  Medications Prior to Admission  Medication Sig Dispense Refill Last Dose  . allopurinol (ZYLOPRIM) 100 MG tablet Take 1 tablet (100 mg total) by mouth daily. 30 tablet 2 Past Week at Unknown time  . alprazolam (XANAX) 2 MG tablet Take 1 mg by mouth at bedtime.    Past Week at Unknown time  . aspirin EC 81 MG tablet Take 81 mg by mouth every morning.   Past Week at Unknown time  . clopidogrel (PLAVIX) 75 MG tablet take  1 tablet by mouth once daily WITH BREAKFAST 90 tablet 3 07/18/2018 at 700  . cyclobenzaprine (FLEXERIL) 10 MG tablet Take 10 mg by mouth at bedtime.    Past Week at Unknown time  . diltiazem (CARDIZEM CD) 240 MG 24 hr capsule Take 240 mg by mouth daily.   Past Week at Unknown time  . metolazone (ZAROXOLYN) 2.5 MG tablet TAKE 1 TABLET ONCE DAILY FOR 2 TO 3 DAYS AS NEEDED ONLY FOR WEIGHT GAIN OF 3 TO 5 LBS IN A 48 HR PERIOD 15 tablet 1 Past Week at Unknown time  . metoprolol tartrate (LOPRESSOR) 25 MG tablet TAKE 1 TABLET BY MOUTH TWICE A DAY 180 tablet 1 07/18/2018 at 2000  . nitroGLYCERIN (NITROLINGUAL) 0.4 MG/SPRAY spray use 1-2 sprays under the tongue if needed for chest pain *MAXIMUM OF 3 SPRAYS IN 15 MINUTES 25 g 3 Taking  . polyethylene glycol powder (GLYCOLAX/MIRALAX) powder Take 17 g by mouth daily as needed (constipation).    Taking  . psyllium (METAMUCIL) 58.6 % powder Take by mouth every morning.   Past Week at Unknown time  . tamsulosin (FLOMAX) 0.4 MG CAPS capsule Take 0.4 mg by mouth every evening.    Past Week at Unknown time  . torsemide (DEMADEX) 20 MG tablet Take Two Tablets ( 40 mg )  Daily Alternating with One Tablet (20 mg) Daily 45 tablet 6 Past Week at Unknown time  . TOUJEO SOLOSTAR 300 UNIT/ML SOPN inject 60 units BID  0 Past Week at Unknown  time  . isosorbide mononitrate (IMDUR) 30 MG 24 hr tablet Take 1 tablet (30 mg total) by mouth daily. 90 tablet 3 Taking   Scheduled:  . allopurinol  100 mg Oral Daily  . alprazolam  1 mg Oral QHS  . aspirin EC  81 mg Oral q morning - 10a  . clopidogrel  75 mg Oral Daily  . furosemide  40 mg Intravenous BID  . hydrALAZINE  10 mg Oral Q8H  . insulin aspart  0-15 Units Subcutaneous TID WC  . insulin aspart  0-5 Units Subcutaneous QHS  . insulin glargine  60 Units Subcutaneous BID  . isosorbide mononitrate  30 mg Oral BID  . metoprolol tartrate  50 mg Oral BID  . tamsulosin  0.4 mg Oral QPM   Infusions:  . heparin 1,450 Units/hr (07/23/18 0157)   PRN: acetaminophen, metoprolol tartrate, morphine injection, ondansetron (ZOFRAN) IV, polyethylene glycol Anti-infectives (From admission, onward)   None      Assessment: [name] a 85 y.o. male requires anticoagulation with a heparin iv infusion for the indication of  chest pain/ACS. Heparin gtt will be started following pharmacy protocol per pharmacy consult.  Heparin level is therapeutic.  Goal of Therapy:  Heparin level 0.3-0.7 units/ml Monitor platelets by anticoagulation protocol: Yes   Plan:  Continue heparin infusion 1450 units per hour Check anti-Xa level  daily while on heparin Continue to monitor H&H and platelets  [name], BS [name], BCPS Clinical Pharmacist Pager [phone] 07/23/2018,7:51 AM

## 2018-07-24 LAB — GLUCOSE, CAPILLARY
Glucose-Capillary: 201 mg/dL — ABNORMAL HIGH (ref 70–99)
Glucose-Capillary: 244 mg/dL — ABNORMAL HIGH (ref 70–99)
Glucose-Capillary: 228 mg/dL — ABNORMAL HIGH (ref 70–99)
Glucose-Capillary: 300 mg/dL — ABNORMAL HIGH (ref 70–99)

## 2018-07-24 LAB — CBC
HCT: 41.9 % (ref 39.0–52.0)
Hemoglobin: 13.7 g/dL (ref 13.0–17.0)
MCH: 29.6 pg (ref 26.0–34.0)
MCHC: 32.7 g/dL (ref 30.0–36.0)
MCV: 90.5 fL (ref 80.0–100.0)
Platelets: 144 10*3/uL — ABNORMAL LOW (ref 150–400)
RBC: 4.63 MIL/uL (ref 4.22–5.81)
RDW: 13.4 % (ref 11.5–15.5)
WBC: 10.3 10*3/uL (ref 4.0–10.5)
nRBC: 0 % (ref 0.0–0.2)

## 2018-07-24 LAB — BASIC METABOLIC PANEL
Anion gap: 12 (ref 5–15)
BUN: 55 mg/dL — ABNORMAL HIGH (ref 8–23)
CO2: 30 mmol/L (ref 22–32)
Calcium: 8.4 mg/dL — ABNORMAL LOW (ref 8.9–10.3)
Chloride: 91 mmol/L — ABNORMAL LOW (ref 98–111)
Creatinine, Ser: 2.34 mg/dL — ABNORMAL HIGH (ref 0.61–1.24)
GFR calc Af Amer: 29 mL/min — ABNORMAL LOW (ref >60)
GFR calc non Af Amer: 25 mL/min — ABNORMAL LOW (ref >60)
Glucose, Bld: 224 mg/dL — ABNORMAL HIGH (ref 70–99)
Potassium: 3.8 mmol/L (ref 3.5–5.1)
Sodium: 133 mmol/L — ABNORMAL LOW (ref 135–145)

## 2018-07-24 LAB — HEPARIN LEVEL (UNFRACTIONATED): Heparin Unfractionated: 0.31 IU/mL (ref 0.30–0.70)

## 2018-07-24 MED ORDER — INSULIN ASPART 100 UNIT/ML ~~LOC~~ SOLN
0.0000 [IU] | Freq: Every day | SUBCUTANEOUS | Status: DC
  Administered 2018-07-24: 22:00:00 3 [IU] via SUBCUTANEOUS

## 2018-07-24 MED ORDER — INSULIN ASPART 100 UNIT/ML ~~LOC~~ SOLN
10.0000 [IU] | Freq: Three times a day (TID) | SUBCUTANEOUS | Status: DC
  Administered 2018-07-24 (×2): 10 [IU] via SUBCUTANEOUS

## 2018-07-24 MED ORDER — INSULIN ASPART 100 UNIT/ML ~~LOC~~ SOLN
6.0000 [IU] | Freq: Three times a day (TID) | SUBCUTANEOUS | Status: DC
  Administered 2018-07-24 – 2018-07-25 (×2): 12 [IU] via SUBCUTANEOUS
  Administered 2018-07-25: 6 [IU] via SUBCUTANEOUS
  Administered 2018-07-25: 17:00:00 12 [IU] via SUBCUTANEOUS
  Administered 2018-07-26: 12:00:00 6 [IU] via SUBCUTANEOUS

## 2018-07-24 MED ORDER — MORPHINE SULFATE (PF) 2 MG/ML IV SOLN
1.0000 mg | INTRAVENOUS | Status: DC | PRN
  Administered 2018-07-24 – 2018-07-26 (×4): 1 mg via INTRAVENOUS
  Filled 2018-07-24 (×4): qty 1

## 2018-07-24 MED ORDER — INSULIN ASPART 100 UNIT/ML ~~LOC~~ SOLN
0.0000 [IU] | Freq: Three times a day (TID) | SUBCUTANEOUS | Status: DC
  Administered 2018-07-24 – 2018-07-25 (×2): 7 [IU] via SUBCUTANEOUS
  Administered 2018-07-25: 11:00:00 11 [IU] via SUBCUTANEOUS
  Administered 2018-07-25: 09:00:00 4 [IU] via SUBCUTANEOUS
  Administered 2018-07-26: 7 [IU] via SUBCUTANEOUS
  Administered 2018-07-26: 12:00:00 4 [IU] via SUBCUTANEOUS

## 2018-07-24 NOTE — Progress Notes (Signed)
PROGRESS NOTE [name]  HEN:[id number]  DOB: July 02, 1933  DOA: 07/21/2018 PCP: [name], MD  Brief Admission Hx: 85 year old male with coronary artery disease, chronic systolic and diastolic heart failure, COPD, stage III CKD presented with chest discomfort and symptoms of unstable angina.  MDM/Assessment & Plan:   1. Unstable angina-patient reports that his chest pain symptoms are mostly completely resolved now.  He remains on IV heparin but it is due to complete after 72 hours at noon today.  He is being managed medically by the cardiology team with aspirin and Plavix.  Cardiology has seen him and they do not plan any invasive or further ischemic work-up at this time. 2. Coronary artery disease- patient is being managed medically with aspirin and Plavix, he is on Imdur and hydralazine.  His he is on metoprolol 50 mg twice daily.  His chest pain symptoms have resolved.  If he remains stable we are hopeful to discharge him home tomorrow. 3. Acute on chronic systolic/diastolic heart failure- he is diuresing well on IV Lasix.  Plan to transition him to oral home torsemide with plans to hopefully discharge home tomorrow.  His weight is being followed closely and currently is down.  He has diuresed 3.17 L. 4. Stage III CKD-his creatinine is holding stable in the setting of IV diuresis.  We have been monitoring this fairly closely. 5. Poorly controlled type 2 diabetes mellitus, insulin requiring with vascular and renal complications- I have added prandial coverage in addition to his basal insulin and supplemental sliding scale coverage.  His BG goal remains 140-180. 6. Diabetic dyslipidemia- he reportedly has a statin intolerance.  He is followed closely with cardiology for this.  Better glycemic control would also help to improve his lipid profile.  We are working to try and better control his blood sugars. 7. BPH-he is urinating well.  He has been resumed on his home Flomax and seems to be  tolerating.  DVT prophylaxis: Heparin infusion Code Status: DNR Family Communication: Spoke with son on telephone Disposition Plan: Planning home tomorrow if remains stable.  PT evaluation is pending, plan is getting patient up to chair and also ambulating in room today.   Consultants:  Cardiology team  Procedures:  N/A  Antimicrobials:  N/A  Subjective: Patient without complaints.  He feels fairly well.  He is not having any chest pain symptoms.  He denies shortness of breath.  He is having no difficulty urinating.  Objective: Vitals:   07/23/18 2126 07/24/18 0028 07/24/18 0441 07/24/18 0830  BP: 136/74 101/71 (!) 152/87 (!) 148/7  Pulse: 65 (!) 39 88 82  Resp: 17 18 18 18   Temp:  98.3 F (36.8 C) 98.9 F (37.2 C) 98.7 F (37.1 C)  TempSrc:  Oral Oral Oral  SpO2: 98% 93% 93% 93%  Weight:      Height:        Intake/Output Summary (Last 24 hours) at 07/24/2018 1131 Last data filed at 07/24/2018 3614 Gross per 24 hour  Intake 360 ml  Output 900 ml  Net -540 ml   Filed Weights   07/21/18 0833  Weight: 124.7 kg   REVIEW OF SYSTEMS  As per history otherwise all reviewed and reported negative  Exam:  General exam: Awake, alert, no apparent distress, cooperative and pleasant. Respiratory system:  No increased work of breathing. Cardiovascular system: Normal S1 & S2 heard. No JVD. Gastrointestinal system: Abdomen is nondistended, soft and nontender. Normal bowel sounds heard. Central nervous system: Alert  and oriented. No focal neurological deficits. Extremities: Trace pretibial edema bilateral lower extremities.  Data Reviewed: Basic Metabolic Panel: Recent Labs  Lab 07/21/18 0835 07/22/18 0309 07/23/18 0647 07/24/18 0606  NA 134* 135 134* 133*  K 4.1 4.1 3.8 3.8  CL 92* 92* 89* 91*  CO2 29 33* 33* 30  GLUCOSE 341* 255* 208* 224*  BUN 59* 57* 55* 55*  CREATININE 2.31* 2.22* 2.16* 2.34*  CALCIUM 9.4 8.9 8.7* 8.4*  PHOS  --   --  2.2*  --    Liver  Function Tests: Recent Labs  Lab 07/23/18 0647  ALBUMIN 3.8   No results for input(s): LIPASE, AMYLASE in the last 168 hours. No results for input(s): AMMONIA in the last 168 hours. CBC: Recent Labs  Lab 07/21/18 0835 07/22/18 0309 07/24/18 0606  WBC 8.8 7.2 10.3  NEUTROABS 6.2  --   --   HGB 13.0 12.4* 13.7  HCT 40.5 40.4 41.9  MCV 91.6 93.7 90.5  PLT 155 148* 144*   Cardiac Enzymes: Recent Labs  Lab 07/21/18 0835 07/21/18 1155 07/21/18 1530 07/21/18 2056 07/22/18 0309  TROPONINI 0.13* 0.42* 0.77* 0.91* 0.76*   CBG (last 3)  Recent Labs    07/23/18 2122 07/24/18 0731 07/24/18 1119  GLUCAP 258* 201* 244*   No results found for this or any previous visit (from the past 240 hour(s)).   Studies: No results found.  Scheduled Meds: . allopurinol  100 mg Oral Daily  . aspirin EC  81 mg Oral q morning - 10a  . clopidogrel  75 mg Oral Daily  . furosemide  40 mg Intravenous BID  . hydrALAZINE  10 mg Oral Q8H  . insulin aspart  0-15 Units Subcutaneous TID WC  . insulin aspart  0-5 Units Subcutaneous QHS  . insulin aspart  10 Units Subcutaneous TID WC  . insulin glargine  60 Units Subcutaneous BID  . isosorbide mononitrate  30 mg Oral BID  . metoprolol tartrate  50 mg Oral BID  . tamsulosin  0.4 mg Oral QPM   Continuous Infusions: . heparin 1,450 Units/hr (07/23/18 2230)   Active Problems:   Coronary artery disease involving native coronary artery of native heart with unstable angina pectoris (HCC)   Essential hypertension, benign   Hyperlipidemia   DM type 2 (diabetes mellitus, type 2) (HCC)   CKD (chronic kidney disease) stage 3, GFR 30-59 ml/min (HCC)   Acute on chronic diastolic heart failure (HCC)   Chest pain   Elevated troponin   Unstable angina ([place])   Demand ischemia ([place])   Secondary cardiomyopathy ([place])  Time spent:   Irwin Brakeman, MD [facility] 07/24/2018, 11:31 AM    LOS: 2 days  How to contact the [facility] Attending or  Consulting provider [place] or covering provider during after hours [place], for this patient?  1. Check the care team in [facility] and look for a) attending/consulting TR[facility] provider listed and b) the [facility] team listed 2. Log into [email or web address] and use [place]'s universal password to access. If you do not have the password, please contact the hospital operator. 3. Locate the [facility] provider you are looking for under [facility] and page to a number that you can be directly reached. 4. If you still have difficulty reaching the provider, please page the [facility] (Director on Call) for the Hospitalists listed on amion for assistance.

## 2018-07-24 NOTE — Progress Notes (Signed)
3 of [place] tried to get patient up. All we could do was pivot him to chair.

## 2018-07-24 NOTE — Progress Notes (Signed)
ANTICOAGULATION CONSULT NOTE - Initial Consult  Pharmacy Consult for heparin gtt  Indication: chest pain/ACS  Allergies  Allergen Reactions  . Zocor [Simvastatin] Other (See Comments)    fatigue  . Lipitor [Atorvastatin] Other (See Comments)    Muscle cramping    Patient Measurements: Height: 5\' 5"  (165.1 cm) Weight: 275 lb (124.7 kg) IBW/kg (Calculated) : 61.5 Heparin Dosing Weight: HEPARIN DW (KG): 91.2   Vital Signs: Temp: 98.9 F (37.2 C) (04/12 0441) Temp Source: Oral (04/12 0441) BP: 152/87 (04/12 0441) Pulse Rate: 88 (04/12 0441)  Labs: Recent Labs    07/21/18 0835  07/21/18 1530  07/21/18 2056 07/22/18 0309 07/22/18 1331 07/23/18 0647 07/24/18 0606  HGB 13.0  --   --   --   --  12.4*  --   --  13.7  HCT 40.5  --   --   --   --  40.4  --   --  41.9  PLT 155  --   --   --   --  148*  --   --  144*  LABPROT 14.0  --   --   --   --   --   --   --   --   INR 1.1  --   --   --   --   --   --   --   --   HEPARINUNFRC  --   --   --    < >  --  0.20* 0.37 0.38 0.31  CREATININE 2.31*  --   --   --   --  2.22*  --  2.16*  --   TROPONINI 0.13*   < > 0.77*  --  0.91* 0.76*  --   --   --    < > = values in this interval not displayed.    Estimated Creatinine Clearance: 31.8 mL/min (A) (by C-G formula based on SCr of 2.16 mg/dL (H)).   Medical History: Past Medical History:  Diagnosis Date  . Allergic rhinitis   . Anxiety   . Arthritis   . COPD (chronic obstructive pulmonary disease) ([place])   . Coronary atherosclerosis of native coronary artery    a. CABG x 4 in 1994. Previous stent placement to SVG to distal RCA;  b. DES to native Cx in 2006;  c. DES SVG to RCA 05/08/11; d. Inf MI/Cath/PCI: LM 25, LAD 100, LCX patent stent, 50 into OM, RI 60-70, RCA 100, LIMA->LAD nl, VG->Diag 100 old, VG->RCA/PL 99 (DES x 1), EF 50-55%.  . Depression    With anxious features/hx panic attacks  . Essential hypertension, benign   . GERD (gastroesophageal reflux disease)   .  Hyperlipidemia    Not on statin 2/2 hx of intolerance.   . Morbid obesity ([place])   . Nephrolithiasis   . Pneumonia   . Sleep apnea   . ST elevation myocardial infarction (STEMI) of inferior wall ([place])    4/14 - DES SVG to PDA  . Type 2 diabetes mellitus (HCC)     Medications:  Medications Prior to Admission  Medication Sig Dispense Refill Last Dose  . allopurinol (ZYLOPRIM) 100 MG tablet Take 1 tablet (100 mg total) by mouth daily. 30 tablet 2 Past Week at Unknown time  . alprazolam (XANAX) 2 MG tablet Take 1 mg by mouth at bedtime.    Past Week at Unknown time  . aspirin EC 81 MG tablet Take 81 mg by mouth every  morning.   Past Week at Unknown time  . clopidogrel (PLAVIX) 75 MG tablet take 1 tablet by mouth once daily WITH BREAKFAST 90 tablet 3 07/18/2018 at 700  . cyclobenzaprine (FLEXERIL) 10 MG tablet Take 10 mg by mouth at bedtime.    Past Week at Unknown time  . diltiazem (CARDIZEM CD) 240 MG 24 hr capsule Take 240 mg by mouth daily.   Past Week at Unknown time  . metolazone (ZAROXOLYN) 2.5 MG tablet TAKE 1 TABLET ONCE DAILY FOR 2 TO 3 DAYS AS NEEDED ONLY FOR WEIGHT GAIN OF 3 TO 5 LBS IN A 48 HR PERIOD 15 tablet 1 Past Week at Unknown time  . metoprolol tartrate (LOPRESSOR) 25 MG tablet TAKE 1 TABLET BY MOUTH TWICE A DAY 180 tablet 1 07/18/2018 at 2000  . nitroGLYCERIN (NITROLINGUAL) 0.4 MG/SPRAY spray use 1-2 sprays under the tongue if needed for chest pain *MAXIMUM OF 3 SPRAYS IN 15 MINUTES 25 g 3 Taking  . polyethylene glycol powder (GLYCOLAX/MIRALAX) powder Take 17 g by mouth daily as needed (constipation).    Taking  . psyllium (METAMUCIL) 58.6 % powder Take by mouth every morning.   Past Week at Unknown time  . tamsulosin (FLOMAX) 0.4 MG CAPS capsule Take 0.4 mg by mouth every evening.    Past Week at Unknown time  . torsemide (DEMADEX) 20 MG tablet Take Two Tablets ( 40 mg )  Daily Alternating with One Tablet (20 mg) Daily 45 tablet 6 Past Week at Unknown time  . TOUJEO SOLOSTAR  300 UNIT/ML SOPN inject 60 units BID  0 Past Week at Unknown time  . isosorbide mononitrate (IMDUR) 30 MG 24 hr tablet Take 1 tablet (30 mg total) by mouth daily. 90 tablet 3 Taking   Scheduled:  . allopurinol  100 mg Oral Daily  . aspirin EC  81 mg Oral q morning - 10a  . clopidogrel  75 mg Oral Daily  . furosemide  40 mg Intravenous BID  . hydrALAZINE  10 mg Oral Q8H  . insulin aspart  0-15 Units Subcutaneous TID WC  . insulin aspart  0-5 Units Subcutaneous QHS  . insulin aspart  10 Units Subcutaneous TID WC  . insulin glargine  60 Units Subcutaneous BID  . isosorbide mononitrate  30 mg Oral BID  . metoprolol tartrate  50 mg Oral BID  . tamsulosin  0.4 mg Oral QPM   Infusions:  . heparin 1,450 Units/hr (07/23/18 2230)   PRN: acetaminophen, alprazolam, metoprolol tartrate, morphine injection, ondansetron (ZOFRAN) IV, polyethylene glycol Anti-infectives (From admission, onward)   None      Assessment: [name] a 85 y.o. male requires anticoagulation with a heparin iv infusion for the indication of  chest pain/ACS. Heparin gtt will be started following pharmacy protocol per pharmacy consult.  Heparin level remains therapeutic this AM. Plan is to complete heparin infusion at noon today.   Goal of Therapy:  Heparin level 0.3-0.7 units/ml Monitor platelets by anticoagulation protocol: Yes   Plan:  Continue heparin infusion 1450 units per hour and discontinue at noon Continue to monitor H&H and platelets  [name], [name], BCPS Clinical Pharmacist Pager [phone] 07/24/2018,8:07 AM

## 2018-07-25 DIAGNOSIS — I200 Unstable angina: Secondary | ICD-10-CM

## 2018-07-25 LAB — GLUCOSE, CAPILLARY
Glucose-Capillary: 187 mg/dL — ABNORMAL HIGH (ref 70–99)
Glucose-Capillary: 251 mg/dL — ABNORMAL HIGH (ref 70–99)
Glucose-Capillary: 224 mg/dL — ABNORMAL HIGH (ref 70–99)
Glucose-Capillary: 148 mg/dL — ABNORMAL HIGH (ref 70–99)

## 2018-07-25 MED ORDER — HYDRALAZINE HCL 10 MG PO TABS: 10.0000 mg | ORAL_TABLET | Freq: Three times a day (TID) | ORAL | Status: DC

## 2018-07-25 MED ORDER — TORSEMIDE 20 MG PO TABS
40.0000 mg | ORAL_TABLET | Freq: Every day | ORAL | Status: DC
  Administered 2018-07-25 – 2018-07-26 (×2): 40 mg via ORAL
  Filled 2018-07-25 (×2): qty 2

## 2018-07-25 MED ORDER — INSULIN GLARGINE 100 UNIT/ML ~~LOC~~ SOLN
64.0000 [IU] | Freq: Two times a day (BID) | SUBCUTANEOUS | Status: DC
  Administered 2018-07-25 – 2018-07-26 (×2): 64 [IU] via SUBCUTANEOUS
  Filled 2018-07-25 (×5): qty 0.64

## 2018-07-25 MED ORDER — OXYCODONE HCL 5 MG PO TABS: 2.5000 mg | ORAL_TABLET | Freq: Four times a day (QID) | ORAL | 0 refills | Status: DC | PRN

## 2018-07-25 MED ORDER — INSULIN ASPART 100 UNIT/ML ~~LOC~~ SOLN: 10.0000 [IU] | Freq: Three times a day (TID) | SUBCUTANEOUS | 11 refills | Status: DC

## 2018-07-25 MED ORDER — ISOSORBIDE MONONITRATE ER 30 MG PO TB24: 30.0000 mg | ORAL_TABLET | Freq: Two times a day (BID) | ORAL | Status: DC

## 2018-07-25 MED ORDER — TOUJEO SOLOSTAR 300 UNIT/ML ~~LOC~~ SOPN: 65.0000 [IU] | PEN_INJECTOR | Freq: Two times a day (BID) | SUBCUTANEOUS | 0 refills | Status: DC

## 2018-07-25 MED ORDER — METOPROLOL TARTRATE 50 MG PO TABS: 50.0000 mg | ORAL_TABLET | Freq: Two times a day (BID) | ORAL | Status: DC

## 2018-07-25 MED ORDER — ALLOPURINOL 100 MG PO TABS: 200.0000 mg | ORAL_TABLET | Freq: Every day | ORAL | 2 refills | Status: DC

## 2018-07-25 MED ORDER — ALPRAZOLAM 0.5 MG PO TABS: 0.5000 mg | ORAL_TABLET | Freq: Two times a day (BID) | ORAL | 0 refills | Status: DC | PRN

## 2018-07-25 NOTE — Progress Notes (Signed)
Asked patient if he wanted to attempt to walk in his room, patient stated "I don't know if I can"  Patient may need PT consult.

## 2018-07-25 NOTE — TOC Initial Note (Signed)
Transition of Care [facility]) - Initial/Assessment Note    Patient Details  Name: [name] MRN: [id number] Date of Birth: March 17, 1934  Transition of [facility]) CM/SW Contact:    Fredrika Canby, Chauncey [place], RN Phone Number: 07/25/2018, 2:30 PM  Clinical Narrative:        Patient admitted with unstable angina and CHF, deconditioning. From home with wife.  Reports independence prior to arrival. Only walked 3-4 feet with PT, recommended for SNF. At first, patient states he wanted to go home. When discussed PT eval, he is agreeable to SNF. Discussed CMS list of SNF options. Patient agreeable to all SNF's in [facility]. Authorization has been started with [employer].    Expected Discharge Plan: Skilled Nursing Facility Barriers to Discharge: No Barriers Identified   Patient Goals and CMS Choice Patient states their goals for this hospitalization and ongoing recovery are:: go to rehab and return home CMS Medicare.gov Compare Post Acute Care list provided to:: Patient Choice offered to / list presented to : Patient  Expected Discharge Plan and Services Expected Discharge Plan: [place]   Discharge Planning Services: CM Consult     Expected Discharge Date: 07/25/18                        Prior Living Arrangements/Services   Lives with:: Spouse Patient language and need for interpreter reviewed:: Yes Do you feel safe going back to the place where you live?: Yes      Need for Family Participation in Patient Care: Yes (Comment) Care giver support system in place?: Yes (comment)   Criminal Activity/Legal Involvement Pertinent to Current Situation/Hospitalization: No - Comment as needed  Activities of Daily Living Home Assistive Devices/Equipment: Cane (specify quad or straight), Shower chair without back, Walker (specify type) ADL Screening (condition at time of admission) Patient's cognitive ability adequate to safely complete daily activities?:  Yes Is the patient deaf or have difficulty hearing?: Yes Does the patient have difficulty seeing, even when wearing glasses/contacts?: No Does the patient have difficulty concentrating, remembering, or making decisions?: No Patient able to express need for assistance with ADLs?: Yes Does the patient have difficulty dressing or bathing?: No Independently performs ADLs?: Yes (appropriate for developmental age) Does the patient have difficulty walking or climbing stairs?: Yes Weakness of Legs: Both Weakness of Arms/Hands: Both  Permission Sought/Granted   Permission granted to share information with : Yes, Verbal Permission Granted  Share Information with NAME: [facility] SNF's           Emotional Assessment   Attitude/Demeanor/Rapport: Engaged Affect (typically observed): Accepting, Calm Orientation: : Oriented to Self, Oriented to Situation, Oriented to Place, Oriented to  Time Alcohol / Substance Use: Not Applicable Psych Involvement: No (comment)  Admission diagnosis:  Unstable angina ([place]) [I20.0] Chest pain in adult [R07.9] Acute on chronic congestive heart failure, unspecified heart failure type [facility]) [I50.9] Patient Active Problem List   Diagnosis Date Noted  . Unstable angina ([place]) 07/22/2018  . Demand ischemia ([place])   . Secondary cardiomyopathy ([place])   . Chest pain 07/21/2018  . Elevated troponin 07/21/2018  . Acute on chronic diastolic heart failure ([place])   . CKD (chronic kidney disease) stage 3, GFR 30-59 ml/min (HCC) 01/01/2014  . CAD (coronary artery disease) of artery bypass graft 08/08/2012  . Sleep apnea   . Essential hypertension, benign   . Obesity, Class III, BMI 40-49.9 (morbid obesity) ([place])   . Hyperlipidemia   . DM  type 2 (diabetes mellitus, type 2) ([place])   . Coronary artery disease involving native coronary artery of native heart with unstable angina pectoris ([place])    PCP:  [name], MD Pharmacy:   [facility] [place],  [place] [address] [place] [address] Phone: [phone] Fax: [phone]  [facility] [phone] - [place], [place] - [address] [place] [address] Phone: [phone] Fax: [phone]     Social Determinants of Health (SDOH) Interventions    Readmission Risk Interventions No flowsheet data found.

## 2018-07-25 NOTE — Care Management Important Message (Signed)
Important Message  Patient Details  Name: [name] MRN: [id number] Date of Birth: November 17, 1933   Medicare Important Message Given:  Yes    Tommy Medal 07/25/2018, 11:43 AM

## 2018-07-25 NOTE — Progress Notes (Signed)
Inpatient Diabetes Program Recommendations  AACE/ADA: New Consensus Statement on Inpatient Glycemic Control   Target Ranges:  Prepandial:   less than 140 mg/dL      Peak postprandial:   less than 180 mg/dL (1-2 hours)      Critically ill patients:  140 - 180 mg/dL  Results for TYDE, LAMISON (MRN [id number]) as of 07/25/2018 07:49  Ref. Range 07/24/2018 07:31 07/24/2018 11:19 07/24/2018 16:01 07/24/2018 21:28  Glucose-Capillary Latest Ref Range: 70 - 99 mg/dL 201 (H) 244 (H) 228 (H) 300 (H)    Review of Glycemic Control  Current orders for Inpatient glycemic control: Lantus 60 units BID, Novolog 6-12 units TID (meal coverage), Novolog 0-20 units TID with meals, Novolog 0-5 units QHS  Inpatient Diabetes Program Recommendations:   Insulin - Basal: Please consider increasing Lantus to 64 units BID.  Thanks, Barnie Alderman, RN, MSN, CDE Diabetes Coordinator Inpatient Diabetes Program [phone] (Team Pager from 8am to 5pm)

## 2018-07-25 NOTE — [place] FL2 (Signed)
[place] MEDICAID FL2 LEVEL OF CARE SCREENING TOOL     IDENTIFICATION  Patient Name: [name] Birthdate: 03/25/1934 Sex: male Admission Date (Current Location): 07/21/2018  [facility] and [place] Number:  [employer] and Address:  [place] [address], [place]      Provider Number: [id number]  Attending Physician Name and Address:  Murlean Iba, MD  Relative Name and Phone Number:  Erron, Wengert [phone]     Current Level of Care: Hospital Recommended Level of Care: [place] Prior Approval Number: [id number]  Date Approved/Denied:   PASRR Number:    Discharge Plan: SNF    Current Diagnoses: Patient Active Problem List   Diagnosis Date Noted  . Unstable angina ([place]) 07/22/2018  . Demand ischemia ([place])   . Secondary cardiomyopathy ([place])   . Chest pain 07/21/2018  . Elevated troponin 07/21/2018  . Acute on chronic diastolic heart failure ([place])   . CKD (chronic kidney disease) stage 3, GFR 30-59 ml/min (HCC) 01/01/2014  . CAD (coronary artery disease) of artery bypass graft 08/08/2012  . Sleep apnea   . Essential hypertension, benign   . Obesity, Class III, BMI 40-49.9 (morbid obesity) ([place])   . Hyperlipidemia   . DM type 2 (diabetes mellitus, type 2) ([place])   . Coronary artery disease involving native coronary artery of native heart with unstable angina pectoris (HCC)     Orientation RESPIRATION BLADDER Height & Weight     Self, Time, Situation, Place  O2 External catheter Weight: 124.7 kg Height:  5\' 5"  (165.1 cm)  BEHAVIORAL SYMPTOMS/MOOD NEUROLOGICAL BOWEL NUTRITION STATUS      Continent Diet(carb modified)  AMBULATORY STATUS COMMUNICATION OF NEEDS Skin   Extensive Assist Verbally Normal                       Personal Care Assistance Level of Assistance  Bathing, Feeding, Dressing Bathing Assistance: Limited assistance Feeding assistance: Limited assistance Dressing  Assistance: Limited assistance     Functional Limitations Info  Sight, Hearing, Speech Sight Info: Adequate Hearing Info: Adequate Speech Info: Adequate    SPECIAL CARE FACTORS FREQUENCY  PT (By licensed PT)     PT Frequency: 5x/week              Contractures Contractures Info: Not present    Additional Factors Info  Code Status, Allergies, Psychotropic Code Status Info: DNR Allergies Info: zocor, lipitor Psychotropic Info: xanax         Current Medications (07/25/2018):  This is the current hospital active medication list Current Facility-Administered Medications  Medication Dose Route Frequency Provider Last Rate Last Dose  . acetaminophen (TYLENOL) tablet 650 mg  650 mg Oral Q4H PRN Kathie Dike, MD      . allopurinol (ZYLOPRIM) tablet 100 mg  100 mg Oral Daily Kathie Dike, MD   100 mg at 07/25/18 0915  . ALPRAZolam Duanne Moron) tablet 0.5 mg  0.5 mg Oral TID PRN Wynetta Emery, Clanford L, MD   0.5 mg at 07/25/18 0915  . aspirin EC tablet 81 mg  81 mg Oral q morning - 10a Kathie Dike, MD   81 mg at 07/25/18 0915  . clopidogrel (PLAVIX) tablet 75 mg  75 mg Oral Daily Kathie Dike, MD   75 mg at 07/25/18 0915  . hydrALAZINE (APRESOLINE) tablet 10 mg  10 mg Oral Q8H Satira Sark, MD   10 mg at 07/25/18 0603  . insulin aspart (  novoLOG) injection 0-20 Units  0-20 Units Subcutaneous TID WC Johnson, Clanford L, MD   11 Units at 07/25/18 1123  . insulin aspart (novoLOG) injection 0-5 Units  0-5 Units Subcutaneous QHS Irwin Brakeman L, MD   3 Units at 07/24/18 2133  . insulin aspart (novoLOG) injection 6-12 Units  6-12 Units Subcutaneous TID WC Johnson, Clanford L, MD   6 Units at 07/25/18 1124  . insulin glargine (LANTUS) injection 64 Units  64 Units Subcutaneous BID Johnson, Clanford L, MD      . isosorbide mononitrate (IMDUR) 24 hr tablet 30 mg  30 mg Oral BID Satira Sark, MD   30 mg at 07/25/18 0915  . metoprolol tartrate (LOPRESSOR) injection 5 mg  5 mg  Intravenous Q5 min PRN Blount, Scarlette Shorts T, NP      . metoprolol tartrate (LOPRESSOR) tablet 50 mg  50 mg Oral BID Satira Sark, MD   50 mg at 07/25/18 0915  . morphine 2 MG/ML injection 1 mg  1 mg Intravenous Q3H PRN Johnson, Clanford L, MD   1 mg at 07/25/18 1018  . ondansetron (ZOFRAN) injection 4 mg  4 mg Intravenous Q6H PRN Kathie Dike, MD      . polyethylene glycol (MIRALAX / GLYCOLAX) packet 17 g  17 g Oral Daily PRN Kathie Dike, MD      . tamsulosin (FLOMAX) capsule 0.4 mg  0.4 mg Oral QPM Kathie Dike, MD   0.4 mg at 07/24/18 1744  . torsemide (DEMADEX) tablet 40 mg  40 mg Oral Daily Johnson, Clanford L, MD   40 mg at 07/25/18 0915     Discharge Medications: Please see discharge summary for a list of discharge medications.  Relevant Imaging Results:  Relevant Lab Results:   Additional Information SSN [id number]  Aspasia Rude, Chauncey Reading, RN

## 2018-07-25 NOTE — Progress Notes (Signed)
Dr McDowell's rounding note reviewed, medically managed ACS. No new recs at this time, call with questions   Zandra Abts MD  [facility] will sign off.   Medication Recommendations: continue current regimen Other recommendations (labs, testing, etc): n/a Follow up as an outpatient: We will arrange

## 2018-07-25 NOTE — Evaluation (Signed)
Physical Therapy Evaluation Patient Details Name: JIHAD BROWNLOW MRN: [id number] DOB: May 10, 1933 Today's Date: 07/25/2018   History of Present Illness  ZYRUS HETLAND is a 85 y.o. male with medical history significant of coronary artery disease, COPD, chronic kidney disease stage III, chronic diastolic heart failure, presents to the emergency room today with complaints of chest discomfort.  He describes chest pain, mild shortness of breath, that began while he was resting.  He has had the symptoms since yesterday.  He reports being compliant with his medications, but does note that he has been retaining some excess fluid.  He is unsure of his weights.  He denies any fever or cough.  He has not had any nausea or vomiting.  He denies any diaphoresis or lightheadedness.  He was evaluated in the emergency room where EKG showed nonspecific changes.  Troponin was elevated 0.13 and then 0.42.  BNP of 126.  Creatinine of 2.31 which is near baseline.  Chest x-ray indicated bibasilar atelectasis.  Patient has been referred for admission.    Clinical Impression  Patient has difficulty sitting up at bedside due to generalized weakness, poor tolerance/balance for taking steps due to right foot pain with limited weigh bearing, severe fall risk and limited to a few steps at bedside.  Patient tolerated sitting up in chair after therapy.  Patient will benefit from continued physical therapy in hospital and recommended venue below to increase strength, balance, endurance for safe ADLs and gait.    Follow Up Recommendations SNF    Equipment Recommendations       Recommendations for Other Services       Precautions / Restrictions Precautions Precautions: Fall Restrictions Weight Bearing Restrictions: No      Mobility  Bed Mobility Overal bed mobility: Needs Assistance Bed Mobility: Supine to Sit     Supine to sit: Mod assist     General bed mobility comments: slow labored movement, head of bed  raised  Transfers Overall transfer level: Needs assistance Equipment used: Rolling walker (2 wheeled) Transfers: Sit to/from [facility] Sit to Stand: Mod assist Stand pivot transfers: Mod assist       General transfer comment: poor tolerance for weightbearing on RLE due to foot pain, requires verbal/tactile cueing for completing sit to stands  Ambulation/Gait Ambulation/Gait assistance: Mod assist;Max assist Gait Distance (Feet): 3 Feet Assistive device: Rolling walker (2 wheeled) Gait Pattern/deviations: Decreased step length - right;Decreased stance time - right;Decreased stride length Gait velocity: slow   General Gait Details: limited to 3-4 short unsteady steps at bedside with tolerance for weightbearing on right foot due to increased pain  Stairs            Wheelchair Mobility    Modified Rankin (Stroke Patients Only)       Balance Overall balance assessment: Needs assistance Sitting-balance support: Feet supported Sitting balance-Leahy Scale: Fair     Standing balance support: During functional activity;Bilateral upper extremity supported Standing balance-Leahy Scale: Poor Standing balance comment: fair/poor using RW due to poor tolerance for weighbearing on right foot                             Pertinent Vitals/Pain Pain Assessment: Faces Faces Pain Scale: Hurts whole lot Pain Location: right foot with pressure or weightbearing Pain Descriptors / Indicators: Sore;Aching;Guarding;Grimacing Pain Intervention(s): Limited activity within patient's tolerance;Monitored during session    Home Living Family/patient expects to be discharged to:: Private residence Living  Arrangements: Spouse/significant other Available Help at Discharge: Family Type of Home: House Home Access: Level entry     Home Layout: One level Home Equipment: Environmental consultant - 2 wheels;Cane - single point      Prior Function Level of Independence: Independent          Comments: household ambulator without AD per patient     Hand Dominance   Dominant Hand: Left    Extremity/Trunk Assessment   Upper Extremity Assessment Upper Extremity Assessment: Generalized weakness    Lower Extremity Assessment Lower Extremity Assessment: Generalized weakness;RLE deficits/detail RLE Deficits / Details: RLE grossly 3+/5 RLE: Unable to fully assess due to pain    Cervical / Trunk Assessment Cervical / Trunk Assessment: Normal  Communication   Communication: No difficulties  Cognition Arousal/Alertness: Awake/alert Behavior During Therapy: WFL for tasks assessed/performed Overall Cognitive Status: Within Functional Limits for tasks assessed                                        General Comments      Exercises     Assessment/Plan    PT Assessment Patient needs continued PT services  PT Problem List Decreased strength;Decreased activity tolerance;Decreased mobility;Decreased balance       PT Treatment Interventions Therapeutic exercise;Gait training;Stair training;Functional mobility training;Patient/family education;Therapeutic activities    PT Goals (Current goals can be found in the Care Plan section)  Acute Rehab PT Goals Patient Stated Goal: return home after rehab PT Goal Formulation: With patient Time For Goal Achievement: 08/08/18 Potential to Achieve Goals: Good    Frequency Min 3X/week   Barriers to discharge        Co-evaluation               AM-PAC PT "6 Clicks" Mobility  Outcome Measure Help needed turning from your back to your side while in a flat bed without using bedrails?: A Lot Help needed moving from lying on your back to sitting on the side of a flat bed without using bedrails?: A Lot Help needed moving to and from a bed to a chair (including a wheelchair)?: A Lot Help needed standing up from a chair using your arms (e.g., wheelchair or bedside chair)?: A Lot Help needed to walk  in hospital room?: A Lot Help needed climbing 3-5 steps with a railing? : Total 6 Click Score: 11    End of Session Equipment Utilized During Treatment: Oxygen;Gait belt Activity Tolerance: Patient tolerated treatment well;Patient limited by fatigue;Patient limited by pain Patient left: with chair alarm set;in chair;with call bell/phone within reach Nurse Communication: Mobility status PT Visit Diagnosis: Unsteadiness on feet (R26.81);Other abnormalities of gait and mobility (R26.89);Muscle weakness (generalized) (M62.81)    Time: 5498-2641 PT Time Calculation (min) (ACUTE ONLY): 34 min   Charges:   PT Evaluation $PT Eval Moderate Complexity: 1 Mod PT Treatments $Therapeutic Activity: 23-37 mins        10:29 AM, 07/25/18 Lonell Grandchild, MPT Physical Therapist with [facility] [phone] office [phone] mobile phone

## 2018-07-25 NOTE — Plan of Care (Signed)
  Problem: Acute Rehab PT Goals(only PT should resolve) Goal: Pt Will Go Supine/Side To Sit Outcome: Progressing Flowsheets (Taken 07/25/2018 1030) Pt will go Supine/Side to Sit: with minimal assist Goal: Patient Will Transfer Sit To/From Stand Outcome: Progressing Flowsheets (Taken 07/25/2018 1030) Patient will transfer sit to/from stand: with minimal assist Goal: Pt Will Transfer Bed To Chair/Chair To Bed Outcome: Progressing Flowsheets (Taken 07/25/2018 1030) Pt will Transfer Bed to Chair/Chair to Bed: with min assist Goal: Pt Will Ambulate Outcome: Progressing Flowsheets (Taken 07/25/2018 1030) Pt will Ambulate: 25 feet; with minimal assist; with moderate assist; with rolling walker   10:31 AM, 07/25/18 Lonell Grandchild, MPT Physical Therapist with [facility] [phone] office [phone] mobile phone

## 2018-07-25 NOTE — Discharge Summary (Signed)
Physician Discharge Summary  [name] [id number] DOB: Aug 23, 1933 DOA: 07/21/2018  PCP: [name], MD Cardiology: Dennard Schaumann date: 07/21/2018 Discharge date: 07/25/2018  Admitted From:  HOME  Disposition: SNF  Recommendations for Outpatient Follow-up:  1. Follow up with PCP in 2 weeks 2. Please follow up with cardiology on 09/12/18 as scheduled 3. Please monitor blood sugars 4 times per day and adjust insulin dose as needed for better glycemic control.  4. Please obtain BMP in 1 week to follow renal function and electrolytes  Discharge Condition: STABLE   CODE STATUS: FULL    Brief Hospitalization Summary: Please see all hospital notes, images, labs for full details of the hospitalization. Dr. Blythe Stanford HPI: [name] is a 85 y.o. male with medical history significant of coronary artery disease, COPD, chronic kidney disease stage III, chronic diastolic heart failure, presents to the emergency room today with complaints of chest discomfort.  He describes chest pain, mild shortness of breath, that began while he was resting.  He has had the symptoms since yesterday.  He reports being compliant with his medications, but does note that he has been retaining some excess fluid.  He is unsure of his weights.  He denies any fever or cough.  He has not had any nausea or vomiting.  He denies any diaphoresis or lightheadedness.  He was evaluated in the emergency room where EKG showed nonspecific changes.  Troponin was elevated 0.13 and then 0.42.  BNP of 126.  Creatinine of 2.31 which is near baseline.  Chest x-ray indicated bibasilar atelectasis.  Patient has been referred for admission. Brief Admission Hx: 85 year old male with coronary artery disease, chronic systolic and diastolic heart failure, COPD, stage III CKD presented with chest discomfort and symptoms of unstable angina.  MDM/Assessment & Plan:   1. Unstable angina-patient reports that his chest pain symptoms are  resolved now.  He was treated with IV heparin for 72 hours.  He was managed medically by the cardiology team with aspirin and Plavix, imdur, metoprolol.  Cardiology has seen him and they do not plan any invasive or further ischemic work-up at this time. 2. Coronary artery disease- patient is being managed medically with aspirin and Plavix, he is on Imdur and hydralazine and metoprolol.  His he is on metoprolol 50 mg twice daily by cardiology.  His chest pain symptoms have resolved.  3. Acute on chronic systolic/diastolic heart failure- he was diuresed on IV Lasix. He is now back on his home torsemide.  He has diuresed 3 L. 4. Stage III CKD-his creatinine is holding stable in the setting of IV diuresis.  We have been monitoring this fairly closely. 5. Gout - he was resumed on his home allopurinol.  6. Poorly controlled type 2 diabetes mellitus, insulin requiring with vascular and renal complications-He is being resumed on his home toujeo with prandial insulin for meals.  Recommended monitoring CBG 4 times per day at [facility].   His BG goal remains 140-180. 7. Generalized weakness - PT recommending SNF placement.  8. Diabetic dyslipidemia- he reportedly has a statin intolerance.  He is followed closely with cardiology for this.  Better glycemic control would also help to improve his lipid profile.  We are working to try and better control his blood sugars. 9. BPH-he is urinating well.  He has been resumed on his home Flomax and seems to be tolerating.  DVT prophylaxis: Heparin infusion Code Status: DNR Family Communication: Spoke with son on telephone Disposition Plan:  SNF placement  Consultants:  Cardiology team  Procedures:  N/A  Antimicrobials:  N/A  Discharge Diagnoses:  Active Problems:   Coronary artery disease involving native coronary artery of native heart with unstable angina pectoris (HCC)   Essential hypertension, benign   Hyperlipidemia   DM type 2 (diabetes mellitus, type  2) (HCC)   CKD (chronic kidney disease) stage 3, GFR 30-59 ml/min (HCC)   Acute on chronic diastolic heart failure (HCC)   Chest pain   Elevated troponin   Unstable angina ([place])   Demand ischemia ([place])   Secondary cardiomyopathy ([place])   Discharge Instructions: Discharge Instructions    (HEART FAILURE PATIENTS) Call MD:  Anytime you have any of the following symptoms: 1) 3 pound weight gain in 24 hours or 5 pounds in 1 week 2) shortness of breath, with or without a dry hacking cough 3) swelling in the hands, feet or stomach 4) if you have to sleep on extra pillows at night in order to breathe.   Complete by:  As directed    Call MD for:  difficulty breathing, headache or visual disturbances   Complete by:  As directed    Call MD for:  extreme fatigue   Complete by:  As directed    Call MD for:  persistant nausea and vomiting   Complete by:  As directed    Call MD for:  severe uncontrolled pain   Complete by:  As directed    Increase activity slowly   Complete by:  As directed      Allergies as of 07/25/2018      Reactions   Zocor [simvastatin] Other (See Comments)   fatigue   Lipitor [atorvastatin] Other (See Comments)   Muscle cramping      Medication List    STOP taking these medications   cyclobenzaprine 10 MG tablet Commonly known as:  FLEXERIL   diltiazem 240 MG 24 hr capsule Commonly known as:  CARDIZEM CD   metolazone 2.5 MG tablet Commonly known as:  ZAROXOLYN     TAKE these medications   allopurinol 100 MG tablet Commonly known as:  ZYLOPRIM Take 2 tablets (200 mg total) by mouth daily. What changed:  how much to take   ALPRAZolam 0.5 MG tablet Commonly known as:  XANAX Take 1 tablet (0.5 mg total) by mouth 2 (two) times daily as needed for sleep or anxiety. What changed:    medication strength  how much to take  when to take this  reasons to take this   aspirin EC 81 MG tablet Take 81 mg by mouth every morning.   clopidogrel 75 MG  tablet Commonly known as:  PLAVIX take 1 tablet by mouth once daily WITH BREAKFAST   hydrALAZINE 10 MG tablet Commonly known as:  APRESOLINE Take 1 tablet (10 mg total) by mouth every 8 (eight) hours.   insulin aspart 100 UNIT/ML injection Commonly known as:  novoLOG Inject 10 Units into the skin 3 (three) times daily with meals. Give only if eats 50% or more of meal.   isosorbide mononitrate 30 MG 24 hr tablet Commonly known as:  IMDUR Take 1 tablet (30 mg total) by mouth 2 (two) times daily. What changed:  when to take this   metoprolol tartrate 50 MG tablet Commonly known as:  LOPRESSOR Take 1 tablet (50 mg total) by mouth 2 (two) times daily. What changed:    medication strength  how much to take   nitroGLYCERIN 0.4 MG/SPRAY spray  Commonly known as:  NITROLINGUAL use 1-2 sprays under the tongue if needed for chest pain *MAXIMUM OF 3 SPRAYS IN 15 MINUTES   oxyCODONE 5 MG immediate release tablet Commonly known as:  Oxy IR/ROXICODONE Take 0.5-1 tablets (2.5-5 mg total) by mouth every 6 (six) hours as needed for severe pain.   polyethylene glycol powder 17 GM/SCOOP powder Commonly known as:  GLYCOLAX/MIRALAX Take 17 g by mouth daily as needed (constipation).   psyllium 58.6 % powder Commonly known as:  METAMUCIL Take by mouth every morning.   tamsulosin 0.4 MG Caps capsule Commonly known as:  FLOMAX Take 0.4 mg by mouth every evening.   torsemide 20 MG tablet Commonly known as:  DEMADEX Take Two Tablets ( 40 mg )  Daily Alternating with One Tablet (20 mg) Daily   Toujeo SoloStar 300 UNIT/ML Sopn Generic drug:  Insulin Glargine (1 Unit Dial) Inject 65 Units into the skin 2 (two) times daily. What changed:  See the new instructions.      Follow-up Information    Satira Sark, MD. Go on 09/12/2018.   Specialty:  Cardiology Why:  as scheduled for follow up   Contact information: [place] [address] [phone]        [name], MD. Schedule an appointment as soon as possible for a visit in 2 week(s).   Specialty:  Family Medicine Why:  Hospital Follow Up appointment  Contact information: [place] [address] [phone]          Allergies  Allergen Reactions  . Zocor [Simvastatin] Other (See Comments)    fatigue  . Lipitor [Atorvastatin] Other (See Comments)    Muscle cramping   Allergies as of 07/25/2018      Reactions   Zocor [simvastatin] Other (See Comments)   fatigue   Lipitor [atorvastatin] Other (See Comments)   Muscle cramping      Medication List    STOP taking these medications   cyclobenzaprine 10 MG tablet Commonly known as:  FLEXERIL   diltiazem 240 MG 24 hr capsule Commonly known as:  CARDIZEM CD   metolazone 2.5 MG tablet Commonly known as:  ZAROXOLYN     TAKE these medications   allopurinol 100 MG tablet Commonly known as:  ZYLOPRIM Take 2 tablets (200 mg total) by mouth daily. What changed:  how much to take   ALPRAZolam 0.5 MG tablet Commonly known as:  XANAX Take 1 tablet (0.5 mg total) by mouth 2 (two) times daily as needed for sleep or anxiety. What changed:    medication strength  how much to take  when to take this  reasons to take this   aspirin EC 81 MG tablet Take 81 mg by mouth every morning.   clopidogrel 75 MG tablet Commonly known as:  PLAVIX take 1 tablet by mouth once daily WITH BREAKFAST   hydrALAZINE 10 MG tablet Commonly known as:  APRESOLINE Take 1 tablet (10 mg total) by mouth every 8 (eight) hours.   insulin aspart 100 UNIT/ML injection Commonly known as:  novoLOG Inject 10 Units into the skin 3 (three) times daily with meals. Give only if eats 50% or more of meal.   isosorbide mononitrate 30 MG 24 hr tablet Commonly known as:  IMDUR Take 1 tablet (30 mg total) by mouth 2 (two) times daily. What changed:  when to take this   metoprolol tartrate 50 MG tablet Commonly known as:  LOPRESSOR Take 1  tablet  (50 mg total) by mouth 2 (two) times daily. What changed:    medication strength  how much to take   nitroGLYCERIN 0.4 MG/SPRAY spray Commonly known as:  NITROLINGUAL use 1-2 sprays under the tongue if needed for chest pain *MAXIMUM OF 3 SPRAYS IN 15 MINUTES   oxyCODONE 5 MG immediate release tablet Commonly known as:  Oxy IR/ROXICODONE Take 0.5-1 tablets (2.5-5 mg total) by mouth every 6 (six) hours as needed for severe pain.   polyethylene glycol powder 17 GM/SCOOP powder Commonly known as:  GLYCOLAX/MIRALAX Take 17 g by mouth daily as needed (constipation).   psyllium 58.6 % powder Commonly known as:  METAMUCIL Take by mouth every morning.   tamsulosin 0.4 MG Caps capsule Commonly known as:  FLOMAX Take 0.4 mg by mouth every evening.   torsemide 20 MG tablet Commonly known as:  DEMADEX Take Two Tablets ( 40 mg )  Daily Alternating with One Tablet (20 mg) Daily   Toujeo SoloStar 300 UNIT/ML Sopn Generic drug:  Insulin Glargine (1 Unit Dial) Inject 65 Units into the skin 2 (two) times daily. What changed:  See the new instructions.       Procedures/Studies: Dg Chest Port 1 View  Result Date: 07/21/2018 CLINICAL DATA:  85 year old male with a history of chest pain EXAM: PORTABLE CHEST 1 VIEW COMPARISON:  07/19/2016, 01/17/2015 FINDINGS: Cardiomediastinal silhouette likely unchanged with cardiomegaly. Heart borders partially obscured by soft tissues of the chest wall and lung/pleural disease. Surgical changes of median sternotomy. Patchy opacities at the lung bases in the retrocardiac region with obscuration of the left hemidiaphragm. IMPRESSION: Low lung volumes, with bibasilar opacities, potentially combination of atelectasis, scarring, and/or consolidation. Surgical changes of median sternotomy. Electronically Signed   By: Corrie Mckusick D.O.   On: 07/21/2018 09:16      Subjective: Patient says he is no longer having chest pain or shortness of breath.  He has been weak  and become deconditioned and not able to ambulate well.  He is agreeable to SNF placement.  Discharge Exam: Vitals:   07/25/18 0100 07/25/18 0637  BP: 126/70 (!) 158/70  Pulse: 84 91  Resp: 18 20  Temp: 98.9 F (37.2 C) 97.7 F (36.5 C)  SpO2: 95% 92%   Vitals:   07/24/18 1314 07/24/18 1536 07/25/18 0100 07/25/18 0637  BP: 122/71 134/69 126/70 (!) 158/70  Pulse: 86 80 84 91  Resp: 18 18 18 20   Temp: 98.4 F (36.9 C) 98.8 F (37.1 C) 98.9 F (37.2 C) 97.7 F (36.5 C)  TempSrc:   Oral   SpO2: 95% 94% 95% 92%  Weight:      Height:       General exam: Awake, alert, no apparent distress, cooperative and pleasant. Respiratory system:  No increased work of breathing. Cardiovascular system: Normal S1 & S2 heard. No JVD. Gastrointestinal system: Abdomen is nondistended, soft and nontender. Normal bowel sounds heard. Central nervous system: Alert and oriented. No focal neurological deficits. Extremities: Trace pretibial edema bilateral lower extremities.  The results of significant diagnostics from this hospitalization (including imaging, microbiology, ancillary and laboratory) are listed below for reference.     Microbiology: No results found for this or any previous visit (from the past 240 hour(s)).   Labs: BNP (last 3 results) Recent Labs    07/21/18 0835  BNP 361.4*   Basic Metabolic Panel: Recent Labs  Lab 07/21/18 0835 07/22/18 0309 07/23/18 0647 07/24/18 0606  NA 134* 135 134* 133*  K 4.1 4.1 3.8 3.8  CL 92* 92* 89* 91*  CO2 29 33* 33* 30  GLUCOSE 341* 255* 208* 224*  BUN 59* 57* 55* 55*  CREATININE 2.31* 2.22* 2.16* 2.34*  CALCIUM 9.4 8.9 8.7* 8.4*  PHOS  --   --  2.2*  --    Liver Function Tests: Recent Labs  Lab 07/23/18 0647  ALBUMIN 3.8   No results for input(s): LIPASE, AMYLASE in the last 168 hours. No results for input(s): AMMONIA in the last 168 hours. CBC: Recent Labs  Lab 07/21/18 0835 07/22/18 0309 07/24/18 0606  WBC 8.8 7.2  10.3  NEUTROABS 6.2  --   --   HGB 13.0 12.4* 13.7  HCT 40.5 40.4 41.9  MCV 91.6 93.7 90.5  PLT 155 148* 144*   Cardiac Enzymes: Recent Labs  Lab 07/21/18 0835 07/21/18 1155 07/21/18 1530 07/21/18 2056 07/22/18 0309  TROPONINI 0.13* 0.42* 0.77* 0.91* 0.76*   BNP: Invalid input(s): POCBNP CBG: Recent Labs  Lab 07/24/18 0731 07/24/18 1119 07/24/18 1601 07/24/18 2128 07/25/18 0800  GLUCAP 201* 244* 228* 300* 187*   D-Dimer No results for input(s): DDIMER in the last 72 hours. Hgb A1c No results for input(s): HGBA1C in the last 72 hours. Lipid Profile No results for input(s): CHOL, HDL, LDLCALC, TRIG, CHOLHDL, LDLDIRECT in the last 72 hours. Thyroid function studies No results for input(s): TSH, T4TOTAL, T3FREE, THYROIDAB in the last 72 hours.  Invalid input(s): FREET3 Anemia work up No results for input(s): VITAMINB12, FOLATE, FERRITIN, TIBC, IRON, RETICCTPCT in the last 72 hours. Urinalysis No results found for: COLORURINE, APPEARANCEUR, LABSPEC, [place], GLUCOSEU, HGBUR, BILIRUBINUR, KETONESUR, PROTEINUR, UROBILINOGEN, NITRITE, LEUKOCYTESUR Sepsis Labs Invalid input(s): PROCALCITONIN,  WBC,  LACTICIDVEN Microbiology No results found for this or any previous visit (from the past 240 hour(s)).  Time coordinating discharge: 69 MINS  SIGNED:  Irwin Brakeman, MD  [facility] 07/25/2018, 10:48 AM How to contact the [facility] Attending or Consulting provider [place] or covering provider during after hours [place], for this patient?  1. Check the care team in [facility] and look for a) attending/consulting [facility] provider listed and b) the [facility] team listed 2. Log into [email or web address] and use [place]'s universal password to access. If you do not have the password, please contact the hospital operator. 3. Locate the [facility] provider you are looking for under [facility] and page to a number that you can be directly reached. 4. If you still have difficulty reaching the  provider, please page the [facility] (Director on Call) for the Hospitalists listed on amion for assistance.

## 2018-07-25 NOTE — TOC Progression Note (Addendum)
Transition of Care [facility]) - Progression Note    Patient Details  Name: VARICK KEYS MRN: [id number] Date of Birth: January 19, 1934  [facility] CM/SW Contact  Garald Rhew, Chauncey Reading, RN Phone Number: 07/25/2018, 3:29 PM  Clinical Narrative: Discussed bed offers with patient. Patient elects [facility]. Call to wife to discuss, she is agreeable. Kerri at [facility] notified of bed acceptande. Awaiting insurance authorization. Patient will be going to room 156 at [facility]. Attending and bedside RN notified. Wife updated that she can drop off clothes for patient at [facility] lobby, she reports she will be able to do this tomorrow.     Expected Discharge Plan: Skilled Nursing Facility Barriers to Discharge: No Barriers Identified  Expected Discharge Plan and Services Expected Discharge Plan: [place]   Discharge Planning Services: CM Consult     Expected Discharge Date: 07/25/18                     Readmission Risk Interventions No flowsheet data found.

## 2018-07-26 ENCOUNTER — Inpatient Hospital Stay
Admission: RE | Admit: 2018-07-26 | Discharge: 2018-08-20 | Disposition: A | Discharge status: Home / Self Care | Payer: PPO | Source: Ambulatory Visit | Attending: Internal Medicine | Admitting: Internal Medicine

## 2018-07-26 DIAGNOSIS — I5033 Acute on chronic diastolic (congestive) heart failure: Secondary | ICD-10-CM | POA: Diagnosis not present

## 2018-07-26 DIAGNOSIS — I130 Hypertensive heart and chronic kidney disease with heart failure and stage 1 through stage 4 chronic kidney disease, or unspecified chronic kidney disease: Secondary | ICD-10-CM | POA: Diagnosis not present

## 2018-07-26 DIAGNOSIS — N183 Chronic kidney disease, stage 3 (moderate): Secondary | ICD-10-CM | POA: Diagnosis not present

## 2018-07-26 DIAGNOSIS — M1A39X0 Chronic gout due to renal impairment, multiple sites, without tophus (tophi): Secondary | ICD-10-CM | POA: Diagnosis not present

## 2018-07-26 DIAGNOSIS — E785 Hyperlipidemia, unspecified: Secondary | ICD-10-CM | POA: Diagnosis not present

## 2018-07-26 DIAGNOSIS — J309 Allergic rhinitis, unspecified: Secondary | ICD-10-CM | POA: Diagnosis not present

## 2018-07-26 DIAGNOSIS — Z794 Long term (current) use of insulin: Secondary | ICD-10-CM | POA: Diagnosis not present

## 2018-07-26 DIAGNOSIS — R0602 Shortness of breath: Secondary | ICD-10-CM | POA: Diagnosis not present

## 2018-07-26 DIAGNOSIS — F418 Other specified anxiety disorders: Secondary | ICD-10-CM | POA: Diagnosis not present

## 2018-07-26 DIAGNOSIS — I25110 Atherosclerotic heart disease of native coronary artery with unstable angina pectoris: Secondary | ICD-10-CM | POA: Diagnosis not present

## 2018-07-26 DIAGNOSIS — M1000 Idiopathic gout, unspecified site: Secondary | ICD-10-CM | POA: Diagnosis not present

## 2018-07-26 DIAGNOSIS — F411 Generalized anxiety disorder: Secondary | ICD-10-CM | POA: Diagnosis not present

## 2018-07-26 DIAGNOSIS — N184 Chronic kidney disease, stage 4 (severe): Secondary | ICD-10-CM | POA: Diagnosis not present

## 2018-07-26 DIAGNOSIS — K219 Gastro-esophageal reflux disease without esophagitis: Secondary | ICD-10-CM | POA: Diagnosis not present

## 2018-07-26 DIAGNOSIS — N390 Urinary tract infection, site not specified: Secondary | ICD-10-CM | POA: Diagnosis not present

## 2018-07-26 DIAGNOSIS — E1122 Type 2 diabetes mellitus with diabetic chronic kidney disease: Secondary | ICD-10-CM | POA: Diagnosis not present

## 2018-07-26 DIAGNOSIS — N400 Enlarged prostate without lower urinary tract symptoms: Secondary | ICD-10-CM | POA: Diagnosis not present

## 2018-07-26 DIAGNOSIS — I350 Nonrheumatic aortic (valve) stenosis: Secondary | ICD-10-CM | POA: Diagnosis not present

## 2018-07-26 DIAGNOSIS — I5032 Chronic diastolic (congestive) heart failure: Secondary | ICD-10-CM | POA: Diagnosis not present

## 2018-07-26 DIAGNOSIS — R279 Unspecified lack of coordination: Secondary | ICD-10-CM | POA: Diagnosis not present

## 2018-07-26 DIAGNOSIS — I25810 Atherosclerosis of coronary artery bypass graft(s) without angina pectoris: Secondary | ICD-10-CM | POA: Diagnosis not present

## 2018-07-26 DIAGNOSIS — M1990 Unspecified osteoarthritis, unspecified site: Secondary | ICD-10-CM | POA: Diagnosis not present

## 2018-07-26 DIAGNOSIS — B9620 Unspecified Escherichia coli [E. coli] as the cause of diseases classified elsewhere: Secondary | ICD-10-CM | POA: Diagnosis not present

## 2018-07-26 DIAGNOSIS — I429 Cardiomyopathy, unspecified: Secondary | ICD-10-CM | POA: Diagnosis not present

## 2018-07-26 DIAGNOSIS — R0902 Hypoxemia: Secondary | ICD-10-CM | POA: Diagnosis not present

## 2018-07-26 DIAGNOSIS — I248 Other forms of acute ischemic heart disease: Secondary | ICD-10-CM | POA: Diagnosis not present

## 2018-07-26 DIAGNOSIS — R41841 Cognitive communication deficit: Secondary | ICD-10-CM | POA: Diagnosis not present

## 2018-07-26 DIAGNOSIS — G4730 Sleep apnea, unspecified: Secondary | ICD-10-CM | POA: Diagnosis not present

## 2018-07-26 DIAGNOSIS — E1129 Type 2 diabetes mellitus with other diabetic kidney complication: Secondary | ICD-10-CM | POA: Diagnosis not present

## 2018-07-26 DIAGNOSIS — Z741 Need for assistance with personal care: Secondary | ICD-10-CM | POA: Diagnosis not present

## 2018-07-26 DIAGNOSIS — K5909 Other constipation: Secondary | ICD-10-CM | POA: Diagnosis not present

## 2018-07-26 DIAGNOSIS — J449 Chronic obstructive pulmonary disease, unspecified: Secondary | ICD-10-CM | POA: Diagnosis not present

## 2018-07-26 DIAGNOSIS — G4733 Obstructive sleep apnea (adult) (pediatric): Secondary | ICD-10-CM | POA: Diagnosis not present

## 2018-07-26 DIAGNOSIS — I5042 Chronic combined systolic (congestive) and diastolic (congestive) heart failure: Secondary | ICD-10-CM | POA: Diagnosis not present

## 2018-07-26 DIAGNOSIS — I10 Essential (primary) hypertension: Secondary | ICD-10-CM | POA: Diagnosis not present

## 2018-07-26 DIAGNOSIS — M6281 Muscle weakness (generalized): Secondary | ICD-10-CM | POA: Diagnosis not present

## 2018-07-26 DIAGNOSIS — Z951 Presence of aortocoronary bypass graft: Secondary | ICD-10-CM | POA: Diagnosis not present

## 2018-07-26 DIAGNOSIS — F329 Major depressive disorder, single episode, unspecified: Secondary | ICD-10-CM | POA: Diagnosis not present

## 2018-07-26 DIAGNOSIS — I200 Unstable angina: Secondary | ICD-10-CM | POA: Diagnosis not present

## 2018-07-26 DIAGNOSIS — M1A30X0 Chronic gout due to renal impairment, unspecified site, without tophus (tophi): Secondary | ICD-10-CM | POA: Diagnosis not present

## 2018-07-26 LAB — GLUCOSE, CAPILLARY
Glucose-Capillary: 201 mg/dL — ABNORMAL HIGH (ref 70–99)
Glucose-Capillary: 189 mg/dL — ABNORMAL HIGH (ref 70–99)

## 2018-07-26 NOTE — Progress Notes (Signed)
07/26/2018 10:28 AM  Patient was seen and examined today.  He is awaiting for placement at [facility].  He is stable for discharge today.  Please see dictated discharge summary.  Murvin Natal MD

## 2018-07-26 NOTE — TOC Transition Note (Addendum)
Transition of Care [facility]) - CM/SW Discharge Note   Patient Details  Name: [name] MRN: [id number] Date of Birth: [birth year]  Transition of [facility]) CM/SW Contact:  Neytiri Asche, Chauncey Reading, RN Phone Number: 07/26/2018, 11:57 AM   Clinical Narrative:   Patient has authorization, # [id number]. RN to call report and [facility] staff will come to get patient and transport back to facility.  Wife and patient updated, wife plans to take clothes to [facility]. DC clinicals sent.    Final next level of care: Skilled Nursing Facility Barriers to Discharge: No Barriers Identified   Patient Goals and CMS Choice Patient states their goals for this hospitalization and ongoing recovery are:: go to rehab and return home CMS Medicare.gov Compare Post Acute Care list provided to:: Patient Choice offered to / list presented to : Patient  Discharge Placement              Patient chooses bed at: [facility] Patient to be transferred to facility by: [facility] staff Name of family member notified: Renetta Chalk Patient and family notified of of transfer: 07/26/18  Discharge Plan and Services   Discharge Planning Services: CM Consult                      Social Determinants of Health (SDOH) Interventions     Readmission Risk Interventions No flowsheet data found.

## 2018-07-26 NOTE — Progress Notes (Signed)
Physical Therapy Treatment Patient Details Name: [name] MRN: [id number] DOB: 26-Feb-1934 Today's Date: 07/26/2018    History of Present Illness [name] is a 85 y.o. male with medical history significant of coronary artery disease, COPD, chronic kidney disease stage III, chronic diastolic heart failure, presents to the emergency room today with complaints of chest discomfort.  He describes chest pain, mild shortness of breath, that began while he was resting.  He has had the symptoms since yesterday.  He reports being compliant with his medications, but does note that he has been retaining some excess fluid.  He is unsure of his weights.  He denies any fever or cough.  He has not had any nausea or vomiting.  He denies any diaphoresis or lightheadedness.  He was evaluated in the emergency room where EKG showed nonspecific changes.  Troponin was elevated 0.13 and then 0.42.  BNP of 126.  Creatinine of 2.31 which is near baseline.  Chest x-ray indicated bibasilar atelectasis.  Patient has been referred for admission.    PT Comments    Pt supine in bed, friendly and willing to participate.  Pt required increased time to complete bed mobility, able to demonstrate good hand placement to assist with less assistance required from therapist, slow labored movements.  Cueing for mechanics with sit to stand.  Used RW for stability upon standing and able to ambulate with mod A short distance to chair.  Pt limited by weakness and pain with weight bearing Rt foot.  EOS pt left in chair with call bell within reach and chair alarm set.  Therapist stated she would discuss pain with RN to see if medication appropriate, pt stated he did not wish any pain medication at this time.  RN aware of status and pain.       Follow Up Recommendations  SNF     Equipment Recommendations       Recommendations for Other Services       Precautions / Restrictions Precautions Precautions:  Fall Restrictions Weight Bearing Restrictions: No    Mobility  Bed Mobility Overal bed mobility: Modified Independent Bed Mobility: Supine to Sit     Supine to sit: Min guard     General bed mobility comments: slow labored movement, head of bed raised, cueing for handplacement to assist  Transfers Overall transfer level: Needs assistance Equipment used: Rolling walker (2 wheeled) Transfers: Sit to/from Stand Sit to Stand: Mod assist         General transfer comment: poor tolerance for weightbearing on RLE due to foot pain, requires verbal/tactile cueing for completing sit to stands  Ambulation/Gait Ambulation/Gait assistance: Mod assist Gait Distance (Feet): 4 Feet Assistive device: Rolling walker (2 wheeled) Gait Pattern/deviations: Decreased step length - right;Decreased stance time - right;Decreased stride length Gait velocity: slow   General Gait Details: limited to 3-4 short unsteady steps at bedside with tolerance for weightbearing on right foot due to increased pain   Stairs             Wheelchair Mobility    Modified Rankin (Stroke Patients Only)       Balance                                            Cognition Arousal/Alertness: Awake/alert Behavior During Therapy: WFL for tasks assessed/performed Overall Cognitive Status: Within Functional Limits for tasks assessed  Exercises      General Comments        Pertinent Vitals/Pain Pain Score: 8  Pain Location: right foot with pressure or weightbearing Pain Descriptors / Indicators: Sore;Aching;Guarding;Grimacing Pain Intervention(s): Monitored during session;Limited activity within patient's tolerance    Home Living                      Prior Function            PT Goals (current goals can now be found in the care plan section)      Frequency    Min 3X/week      PT Plan Current plan  remains appropriate    Co-evaluation              AM-PAC PT "6 Clicks" Mobility   Outcome Measure  Help needed turning from your back to your side while in a flat bed without using bedrails?: A Lot Help needed moving from lying on your back to sitting on the side of a flat bed without using bedrails?: A Lot Help needed moving to and from a bed to a chair (including a wheelchair)?: A Lot Help needed standing up from a chair using your arms (e.g., wheelchair or bedside chair)?: A Lot Help needed to walk in hospital room?: A Lot Help needed climbing 3-5 steps with a railing? : Total 6 Click Score: 11    End of Session Equipment Utilized During Treatment: Oxygen;Gait belt Activity Tolerance: Patient tolerated treatment well;Patient limited by fatigue;Patient limited by pain Patient left: with chair alarm set;in chair;with call bell/phone within reach Nurse Communication: Mobility status PT Visit Diagnosis: Unsteadiness on feet (R26.81);Other abnormalities of gait and mobility (R26.89);Muscle weakness (generalized) (M62.81)     Time: 3729-0211 PT Time Calculation (min) (ACUTE ONLY): 25 min  Charges:  $Therapeutic Activity: 23-37 mins                    [address], LPTA; CBIS [phone]  Aldona Lento 07/26/2018, 11:46 AM

## 2018-07-27 ENCOUNTER — Encounter: Payer: Self-pay | Admitting: Adult Health

## 2018-07-27 ENCOUNTER — Non-Acute Institutional Stay (SKILLED_NURSING_FACILITY): Payer: PPO | Admitting: Adult Health

## 2018-07-27 ENCOUNTER — Other Ambulatory Visit: Payer: Self-pay | Admitting: Adult Health

## 2018-07-27 ENCOUNTER — Telehealth: Payer: Self-pay | Admitting: Cardiology

## 2018-07-27 DIAGNOSIS — N400 Enlarged prostate without lower urinary tract symptoms: Secondary | ICD-10-CM | POA: Insufficient documentation

## 2018-07-27 DIAGNOSIS — I429 Cardiomyopathy, unspecified: Secondary | ICD-10-CM

## 2018-07-27 DIAGNOSIS — N183 Chronic kidney disease, stage 3 (moderate): Secondary | ICD-10-CM | POA: Diagnosis not present

## 2018-07-27 DIAGNOSIS — M1A30X0 Chronic gout due to renal impairment, unspecified site, without tophus (tophi): Secondary | ICD-10-CM | POA: Insufficient documentation

## 2018-07-27 DIAGNOSIS — I5033 Acute on chronic diastolic (congestive) heart failure: Secondary | ICD-10-CM

## 2018-07-27 DIAGNOSIS — Z794 Long term (current) use of insulin: Secondary | ICD-10-CM

## 2018-07-27 DIAGNOSIS — J449 Chronic obstructive pulmonary disease, unspecified: Secondary | ICD-10-CM | POA: Diagnosis not present

## 2018-07-27 DIAGNOSIS — E1122 Type 2 diabetes mellitus with diabetic chronic kidney disease: Secondary | ICD-10-CM | POA: Diagnosis not present

## 2018-07-27 DIAGNOSIS — I25110 Atherosclerotic heart disease of native coronary artery with unstable angina pectoris: Secondary | ICD-10-CM

## 2018-07-27 DIAGNOSIS — K5909 Other constipation: Secondary | ICD-10-CM | POA: Diagnosis not present

## 2018-07-27 DIAGNOSIS — I350 Nonrheumatic aortic (valve) stenosis: Secondary | ICD-10-CM | POA: Insufficient documentation

## 2018-07-27 DIAGNOSIS — F418 Other specified anxiety disorders: Secondary | ICD-10-CM | POA: Diagnosis not present

## 2018-07-27 DIAGNOSIS — N184 Chronic kidney disease, stage 4 (severe): Secondary | ICD-10-CM | POA: Diagnosis not present

## 2018-07-27 MED ORDER — OXYCODONE HCL 5 MG PO TABS: 2.5000 mg | ORAL_TABLET | Freq: Four times a day (QID) | ORAL | 0 refills | Status: DC | PRN

## 2018-07-27 MED ORDER — ALPRAZOLAM 0.5 MG PO TABS: 0.5000 mg | ORAL_TABLET | Freq: Two times a day (BID) | ORAL | 0 refills | Status: DC | PRN

## 2018-07-27 NOTE — Telephone Encounter (Signed)
Spoke with Janett Billow [facility]) and she gave appropriate phone number for telehealth appt with Estella Husk on 08/01/18. She transferred me to try and schedule this as a video phone call with scheduler and LM on her VM. (was told they are scheduling video appt via nursing ipads)

## 2018-07-27 NOTE — Telephone Encounter (Signed)
D/c summary say f/u with cardiology in June (already scheduled) does he need to keep 4/20 appt with Estella Husk?

## 2018-07-27 NOTE — Progress Notes (Signed)
Virtual Visit via Telephone Note   This visit type was conducted due to national recommendations for restrictions regarding the COVID-19 Pandemic (e.g. social distancing) in an effort to limit this patient's exposure and mitigate transmission in our community.  Due to his co-morbid illnesses, this patient is at least at moderate risk for complications without adequate follow up.  This format is felt to be most appropriate for this patient at this time.  The patient did not have access to video technology/had technical difficulties with video requiring transitioning to audio format only (telephone).  All issues noted in this document were discussed and addressed.  No physical exam could be performed with this format.  Please refer to the patient's chart for his  consent to telehealth for [facility].   Evaluation Performed:  Follow-up visit  Date:  08/01/2018   ID:  [name], [name] [birth year], MRN [id number]  Patient Location: [place] Provider Location: Home  PCP:  Quintin Alto Silvestre Moment, MD  Cardiologist:  [name], MD  Electrophysiologist:  None   Chief Complaint:  Hospital f/u  History of Present Illness:    [name] is a 85 y.o. male with with a history of CAD status post CABG 1994 with graft disease and mutiple PCIs, chronic diastolic heart failure, hypertension, type 2 diabetes mellitus, COPD, and CKD stage III, statin intolerance, morbid obesity.   Patient was admitted to the hospital with unstable angina and demand ischemia with peak troponin 0.91, ECG without acute change. F/U echo LVEF 40-45%, sclerotic aortic valve, possibly mild stenosis. Beta blocker and nitrates increased and was on IV heparin for 48-72 hrs. Hydralazine was added. No ACEI/ARB or Aldactone because of renal insufficiency. Plan was to treat medically without further ischemic workup. Patient discharged to the [facility].  I discussed patient with his nurse, [name].  She says he's confused. Patient denies chest pain, dyspnea, dyspnea on exertion, dizziness, or presyncope.He can't remember being in the hospital.  Blood pressure was up initially this morning but when she retook it after his medications were given it came down.  She reports 1+ pitting edema in his legs and his weight is up 2 pounds from last Friday.  He has not had blood work done.  He is on an antibiotic for a UTI.  He is working with physical therapy but is very unsteady on his feet.  The patient does not have symptoms concerning for COVID-19 infection (fever, chills, cough, or new shortness of breath).    Past Medical History:  Diagnosis Date  . Allergic rhinitis   . Anxiety   . Arthritis   . COPD (chronic obstructive pulmonary disease) ([place])   . Coronary atherosclerosis of native coronary artery    a. CABG x 4 in 1994. Previous stent placement to SVG to distal RCA;  b. DES to native Cx in 2006;  c. DES SVG to RCA 05/08/11; d. Inf MI/Cath/PCI: LM 25, LAD 100, LCX patent stent, 50 into OM, RI 60-70, RCA 100, LIMA->LAD nl, VG->Diag 100 old, VG->RCA/PL 99 (DES x 1), EF 50-55%.  . Depression    With anxious features/hx panic attacks  . Essential hypertension, benign   . GERD (gastroesophageal reflux disease)   . Hyperlipidemia    Not on statin 2/2 hx of intolerance.   . Morbid obesity ([place])   . Nephrolithiasis   . Pneumonia   . Sleep apnea   . ST elevation myocardial infarction (STEMI) of inferior wall ([place])  4/14 - DES SVG to PDA  . Type 2 diabetes mellitus ([place])    Past Surgical History:  Procedure Laterality Date  . CORONARY ARTERY BYPASS GRAFT  1994  . EYE SURGERY    . LEFT HEART CATHETERIZATION WITH CORONARY ANGIOGRAM N/A 05/08/2011   Procedure: LEFT HEART CATHETERIZATION WITH CORONARY ANGIOGRAM;  Surgeon: [name], MD;  Location: [facility] CATH LAB;  Service: Cardiovascular;  Laterality: N/A;  . LEFT HEART CATHETERIZATION WITH CORONARY ANGIOGRAM N/A 08/08/2012   Procedure:  LEFT HEART CATHETERIZATION WITH CORONARY ANGIOGRAM;  Surgeon: [name], MD;  Location: [facility] CATH LAB;  Service: Cardiovascular;  Laterality: N/A;  . PERCUTANEOUS CORONARY STENT INTERVENTION (PCI-S) Right 05/08/2011   Procedure: PERCUTANEOUS CORONARY STENT INTERVENTION (PCI-S);  Surgeon: [name], MD;  Location: [facility] CATH LAB;  Service: Cardiovascular;  Laterality: Right;  . PERCUTANEOUS CORONARY STENT INTERVENTION (PCI-S) N/A 08/08/2012   Procedure: PERCUTANEOUS CORONARY STENT INTERVENTION (PCI-S);  Surgeon: [name], MD;  Location: [facility] CATH LAB;  Service: Cardiovascular;  Laterality: N/A;     Current Meds  Medication Sig  . allopurinol (ZYLOPRIM) 100 MG tablet Take 2 tablets (200 mg total) by mouth daily.  Marland Kitchen ALPRAZolam (XANAX) 0.5 MG tablet Take 1 tablet (0.5 mg total) by mouth 2 (two) times daily as needed for up to 13 days for sleep or anxiety.  Marland Kitchen aspirin EC 81 MG tablet Take 81 mg by mouth every morning.  . clopidogrel (PLAVIX) 75 MG tablet take 1 tablet by mouth once daily WITH BREAKFAST  . hydrALAZINE (APRESOLINE) 10 MG tablet Take 1 tablet (10 mg total) by mouth every 8 (eight) hours.  . insulin aspart (NOVOLOG) 100 UNIT/ML injection Inject 10 Units into the skin 3 (three) times daily with meals. Give only if eats 50% or more of meal.  . isosorbide mononitrate (IMDUR) 30 MG 24 hr tablet Take 1 tablet (30 mg total) by mouth 2 (two) times daily.  . metoprolol tartrate (LOPRESSOR) 50 MG tablet Take 1 tablet (50 mg total) by mouth 2 (two) times daily.  . nitroGLYCERIN (NITROLINGUAL) 0.4 MG/SPRAY spray use 1-2 sprays under the tongue if needed for chest pain *MAXIMUM OF 3 SPRAYS IN 15 MINUTES  . NON FORMULARY Diet Type: NAS, Consistent Carbohydrate  . ondansetron (ZOFRAN) 4 MG tablet Take 4 mg by mouth every 8 (eight) hours as needed for nausea or vomiting.  . OXYGEN Inhale 2 L/min into the lungs continuous.  . polyethylene glycol powder (GLYCOLAX/MIRALAX) powder  Take 17 g by mouth daily as needed (constipation).   . psyllium (METAMUCIL) 58.6 % powder Take by mouth every morning.  . tamsulosin (FLOMAX) 0.4 MG CAPS capsule Take 0.4 mg by mouth every evening.   . torsemide (DEMADEX) 20 MG tablet Give 2 tablets (40 mg) by mouth on Even days and 1 tablet (20 mg) once a day on Odd days  . TOUJEO SOLOSTAR 300 UNIT/ML SOPN Inject 65 Units into the skin 2 (two) times daily.  Marland Kitchen UNABLE TO FIND CPAP from home while sleeping, at previous home settings     Allergies:   Zocor [simvastatin] and Lipitor [atorvastatin]   Social History   Tobacco Use  . Smoking status: Never Smoker  . Smokeless tobacco: Never Used  Substance Use Topics  . Alcohol use: No    Alcohol/week: 0.0 standard drinks  . Drug use: No     Family Hx: The patient's family history includes Heart attack in his mother.  ROS:   Please see the  history of present illness.    Review of Systems  Reason unable to perform ROS: confusion.    All other systems reviewed and are negative.   Prior CV studies:   The following studies were reviewed today:  Echocardiogram 07/21/2018:  1. Images are limited.  2. The left ventricle had a visually estimated ejection fraction of approximately 40-45%. Unable to assess focal wall motion. The cavity size was normal. There is moderately increased left ventricular wall thickness. Left ventricular diastolic Doppler  parameters are consistent with impaired relaxation.  3. The cavity was mildly enlarged.  4. The aortic valve is tricuspid. Moderate calcification of the aortic valve. Mild to moderate aortic annular calcification noted. Possible mild aortic stenosis.  5. The mitral valve is grossly normal. There is mild to moderate mitral annular calcification present.  6. The tricuspid valve was grossly normal.  7. The aortic root is normal in size and structure.     Labs/Other Tests and Data Reviewed:    EKG:  An ECG dated 07/22/18 was personally reviewed  today and demonstrated:  ST at 107,  with PAC's, IRBBB, ST elevation inferiorly.  Recent Labs: 07/21/2018: B Natriuretic Peptide 126.0 07/24/2018: BUN 55; Creatinine, Ser 2.34; Potassium 3.8; Sodium 133 07/29/2018: Hemoglobin 15.0; Platelets 282   Recent Lipid Panel Lab Results  Component Value Date/Time   CHOL 185 05/25/2014 05:49 AM   TRIG 92 05/25/2014 05:49 AM   HDL 44 05/25/2014 05:49 AM   CHOLHDL 4.2 05/25/2014 05:49 AM   LDLCALC 123 (H) 05/25/2014 05:49 AM    Wt Readings from Last 3 Encounters:  08/01/18 265 lb 11.2 oz (120.5 kg)  07/29/18 265 lb (120.2 kg)  07/28/18 264 lb 8 oz (120 kg)     Objective:    Vital Signs:  BP 130/80 Comment: after his meds  Pulse 100   Temp 97.8 F (36.6 C)   Resp (!) 22   Wt 265 lb 11.2 oz (120.5 kg)   SpO2 97% Comment: on 2L  BMI 44.21 kg/m    Per nurse report Well nourished, well developed male in no  acute distress. Plus 1 pitting edema up to his knees, not short of breath   ASSESSMENT & PLAN:    1. Recent hospitalization with unstable angina and demand ischemia 2. CAD S/P CABG in 1994 with graft disease and multiple PCI's -no chest pain to report 3. Ischemic Cardiomyopathy LVEF 40-45% on echo 4. Chronic combined systolic and diastolic CHF-plus one pitting edema in legs, Crt 2.34 on 4/12 and takes Demadex 40mg  alternating with 20 mg qod. Will check bmet before increasing demadex 5. Mild Aortic stenosis 6. CKD stage III -recheck this week 7. Hyperlipidemia with Statin intolerance 8. Morbid obesity  COVID-19 Education: The signs and symptoms of COVID-19 were discussed with the patient and how to seek care for testing (follow up with PCP or arrange E-visit).  The importance of social distancing was discussed today.  Time:   Today, I have spent 14 minutes with the patient & his nurse [name] with telehealth technology discussing the above problems.     Medication Adjustments/Labs and Tests Ordered: Current medicines are  reviewed at length with the patient today.  Concerns regarding medicines are outlined above.   Tests Ordered: No orders of the defined types were placed in this encounter.   Medication Changes: No orders of the defined types were placed in this encounter.   Disposition:  Follow up in 1 month(s) Dr. [name]  Signed, Ermalinda Barrios,  PA-C  08/01/2018 12:18 PM    [facility]

## 2018-07-27 NOTE — Telephone Encounter (Signed)
Probably keep the April 20 visit for just a phone conversation with Sharyn Lull since he was just in the hospital with medically managed ACS.

## 2018-07-27 NOTE — Telephone Encounter (Signed)
[place] called asking about patient's upcoming appointments.  He is currently scheduled for August 01, 2018 with Estella Husk.  Also, scheduled to see Dr [name] in June ,2020. Please advise if April 20th appointment needs to be cancelled.

## 2018-07-27 NOTE — Progress Notes (Addendum)
Location:   [place] Room Number: [place] of Service:  SNF (31)   CODE STATUS: DNR  Allergies  Allergen Reactions  . Zocor [Simvastatin] Other (See Comments)    fatigue  . Lipitor [Atorvastatin] Other (See Comments)    Muscle cramping    Chief Complaint  Patient presents with  . Hospitalization Follow-up    Hospital Follow up    HPI:  He is a 85 year old man who has been hospitalized from 07-21-18 through 07-25-18. He went to the ED for chest pain. He was treated unstable angina; has history of cad status post cabg. Treated for diastolic heart failure and was diuresed for 3L of fluid. His diabetes is poorly managed. He is unable to tolerate statins. He is here for short term rehab with his goal to return home. He denies any chest pain or shortness of breath. He does have suprapubic pain; with urgency and hesitancy. There are no reports of fevers present. He will continue to be followed for his chronic illnesses including: cad; heart failure diabetes.   Past Medical History:  Diagnosis Date  . Allergic rhinitis   . Anxiety   . Arthritis   . COPD (chronic obstructive pulmonary disease) ([place])   . Coronary atherosclerosis of native coronary artery    a. CABG x 4 in 1994. Previous stent placement to SVG to distal RCA;  b. DES to native Cx in 2006;  c. DES SVG to RCA 05/08/11; d. Inf MI/Cath/PCI: LM 25, LAD 100, LCX patent stent, 50 into OM, RI 60-70, RCA 100, LIMA->LAD nl, VG->Diag 100 old, VG->RCA/PL 99 (DES x 1), EF 50-55%.  . Depression    With anxious features/hx panic attacks  . Essential hypertension, benign   . GERD (gastroesophageal reflux disease)   . Hyperlipidemia    Not on statin 2/2 hx of intolerance.   . Morbid obesity ([place])   . Nephrolithiasis   . Pneumonia   . Sleep apnea   . ST elevation myocardial infarction (STEMI) of inferior wall ([place])    4/14 - DES SVG to PDA  . Type 2 diabetes mellitus ([place])     Past Surgical History:   Procedure Laterality Date  . CORONARY ARTERY BYPASS GRAFT  1994  . EYE SURGERY    . LEFT HEART CATHETERIZATION WITH CORONARY ANGIOGRAM N/A 05/08/2011   Procedure: LEFT HEART CATHETERIZATION WITH CORONARY ANGIOGRAM;  Surgeon: [name], MD;  Location: [facility] CATH LAB;  Service: Cardiovascular;  Laterality: N/A;  . LEFT HEART CATHETERIZATION WITH CORONARY ANGIOGRAM N/A 08/08/2012   Procedure: LEFT HEART CATHETERIZATION WITH CORONARY ANGIOGRAM;  Surgeon: [name], MD;  Location: [facility] CATH LAB;  Service: Cardiovascular;  Laterality: N/A;  . PERCUTANEOUS CORONARY STENT INTERVENTION (PCI-S) Right 05/08/2011   Procedure: PERCUTANEOUS CORONARY STENT INTERVENTION (PCI-S);  Surgeon: [name], MD;  Location: [facility] CATH LAB;  Service: Cardiovascular;  Laterality: Right;  . PERCUTANEOUS CORONARY STENT INTERVENTION (PCI-S) N/A 08/08/2012   Procedure: PERCUTANEOUS CORONARY STENT INTERVENTION (PCI-S);  Surgeon: [name], MD;  Location: [facility] CATH LAB;  Service: Cardiovascular;  Laterality: N/A;    Social History   Socioeconomic History  . Marital status: Married    Spouse name: Not on file  . Number of children: Not on file  . Years of education: Not on file  . Highest education level: Not on file  Occupational History  . Not on file  Social Needs  . Financial resource strain: Not on  file  . Food insecurity:    Worry: Not on file    Inability: Not on file  . Transportation needs:    Medical: Not on file    Non-medical: Not on file  Tobacco Use  . Smoking status: Never Smoker  . Smokeless tobacco: Never Used  Substance and Sexual Activity  . Alcohol use: No    Alcohol/week: 0.0 standard drinks  . Drug use: No  . Sexual activity: Never  Lifestyle  . Physical activity:    Days per week: Not on file    Minutes per session: Not on file  . Stress: Not on file  Relationships  . Social connections:    Talks on phone: Not on file    Gets together: Not on file     Attends religious service: Not on file    Active member of club or organization: Not on file    Attends meetings of clubs or organizations: Not on file    Relationship status: Not on file  . Intimate partner violence:    Fear of current or ex partner: Not on file    Emotionally abused: Not on file    Physically abused: Not on file    Forced sexual activity: Not on file  Other Topics Concern  . Not on file  Social History Narrative  . Not on file   Family History  Problem Relation Age of Onset  . Heart attack Mother        Age 57      VITAL SIGNS BP 116/65   Pulse 90   Temp 97.7 F (36.5 C)   Resp 17   Ht 5\' 5"  (1.651 m)   Wt 266 lb 8 oz (120.9 kg)   BMI 44.35 kg/m   Outpatient Encounter Medications as of 07/27/2018  Medication Sig  . allopurinol (ZYLOPRIM) 100 MG tablet Take 2 tablets (200 mg total) by mouth daily.  Marland Kitchen ALPRAZolam (XANAX) 0.5 MG tablet Take 1 tablet (0.5 mg total) by mouth 2 (two) times daily as needed for up to 13 days for sleep or anxiety.  Marland Kitchen aspirin EC 81 MG tablet Take 81 mg by mouth every morning.  . clopidogrel (PLAVIX) 75 MG tablet take 1 tablet by mouth once daily WITH BREAKFAST  . hydrALAZINE (APRESOLINE) 10 MG tablet Take 1 tablet (10 mg total) by mouth every 8 (eight) hours.  . insulin aspart (NOVOLOG) 100 UNIT/ML injection Inject 10 Units into the skin 3 (three) times daily with meals. Give only if eats 50% or more of meal.  . isosorbide mononitrate (IMDUR) 30 MG 24 hr tablet Take 1 tablet (30 mg total) by mouth 2 (two) times daily.  . metoprolol tartrate (LOPRESSOR) 50 MG tablet Take 1 tablet (50 mg total) by mouth 2 (two) times daily.  . nitroGLYCERIN (NITROLINGUAL) 0.4 MG/SPRAY spray use 1-2 sprays under the tongue if needed for chest pain *MAXIMUM OF 3 SPRAYS IN 15 MINUTES  . NON FORMULARY Diet Type: NAS, Consistent Carbohydrate  . oxyCODONE (OXY IR/ROXICODONE) 5 MG immediate release tablet Take 0.5-1 tablets (2.5-5 mg total) by mouth every 6  (six) hours as needed for up to 5 days for severe pain.  . OXYGEN Inhale 2 L/min into the lungs continuous.  . polyethylene glycol powder (GLYCOLAX/MIRALAX) powder Take 17 g by mouth daily as needed (constipation).   . psyllium (METAMUCIL) 58.6 % powder Take by mouth every morning.  . tamsulosin (FLOMAX) 0.4 MG CAPS capsule Take 0.4 mg by mouth every  evening.   . torsemide (DEMADEX) 20 MG tablet Give 2 tablets (40 mg) by mouth on Even days and 1 tablet (20 mg) once a day on Odd days  . TOUJEO SOLOSTAR 300 UNIT/ML SOPN Inject 65 Units into the skin 2 (two) times daily.  . [DISCONTINUED] torsemide (DEMADEX) 20 MG tablet Take Two Tablets ( 40 mg )  Daily Alternating with One Tablet (20 mg) Daily (Patient not taking: Reported on 07/27/2018)   No facility-administered encounter medications on file as of 07/27/2018.      SIGNIFICANT DIAGNOSTIC EXAMS  TODAY:  07-21-18: 2-d echo:  1. Images are limited.  2. The left ventricle had a visually estimated ejection fraction of approximately 40-45%. Unable to assess focal wall motion. The cavity size was normal. There is moderately increased left ventricular wall thickness. Left ventricular diastolic Doppler  parameters are consistent with impaired relaxation.  3. The cavity was mildly enlarged.  4. The aortic valve is tricuspid. Moderate calcification of the aortic valve. Mild to moderate aortic annular calcification noted. Possible mild aortic stenosis.  5. The mitral valve is grossly normal. There is mild to moderate mitral annular calcification present.  6. The tricuspid valve was grossly normal.  7. The aortic root is normal in size and structure.  07-21-18: chest x-ray: Low lung volumes, with bibasilar opacities, potentially combination of atelectasis, scarring, and/or consolidation.  Surgical changes of median sternotomy.   LABS REVIEWED TODAY:   07-21-18: wbc 8.8; hgb 13.0; hct 40.5; mcv 91.6; plt 155; glucose 341l bun 59; creat 2.31; k+ 4.1;  na++ 134; ca 9.4 BNP 126.0 hgb a1c 11.5  07-23-18: glucose 208; bun 55; creat 2.16; k+ 3.8; na++ 134; ca 8.7; phos 2.2; albumin 3.8    Review of Systems  Constitutional: Positive for malaise/fatigue.  Respiratory: Negative for cough and shortness of breath.        02 dependent long term   Cardiovascular: Negative for chest pain, palpitations and leg swelling.  Gastrointestinal: Negative for abdominal pain, constipation and heartburn.  Genitourinary:       Has suprapubic pain with hesitancy and urgency   Musculoskeletal: Negative for back pain, joint pain and myalgias.  Skin: Negative.   Neurological: Negative for dizziness.  Psychiatric/Behavioral: The patient is not nervous/anxious.    Physical Exam Constitutional:      General: He is not in acute distress.    Appearance: He is well-developed. He is obese. He is not diaphoretic.  Neck:     Musculoskeletal: Neck supple.     Thyroid: No thyromegaly.  Cardiovascular:     Rate and Rhythm: Normal rate and regular rhythm.     Pulses: Normal pulses.     Heart sounds: Normal heart sounds.     Comments: History of cabg Pulmonary:     Effort: Pulmonary effort is normal. No respiratory distress.     Breath sounds: Normal breath sounds.     Comments: 02 dependent  Abdominal:     General: Bowel sounds are normal. There is no distension.     Palpations: Abdomen is soft.     Tenderness: There is no abdominal tenderness.     Comments: Abdomen rounded   Musculoskeletal:     Right lower leg: Edema present.     Left lower leg: Edema present.     Comments: Trace bilateral lower extremity edema Is able to move all extremities   Lymphadenopathy:     Cervical: No cervical adenopathy.  Skin:    General: Skin is warm  and dry.  Neurological:     Mental Status: He is alert and oriented to person, place, and time.  Psychiatric:        Mood and Affect: Mood normal.        ASSESSMENT/ PLAN:  TODAY:   1. Coronary artery disease  involving native coronary artery of native heart with unstable angina: is stable at this time will continue asa 81 mg daily plavix 75 mg daily; imdur 30 mg twice daily ans has prn ntg.   2. Acute on chronic diastolic heart failure/Secondary cardiomyopathy: is stable EF 40-45% (07-21-18): will continue demadex 40 mg on even days; and 20 mg on odd days; lopressor 50 mg twice daily and imdur 30 mg twice daily apresoline 10 mg every 8 hours  3. Essential hypertension, benign: is stable b/p 116/65 will continue apresoline 10 mg every 8 hours; lopressor 50 mg twice daily   4. Type 2 diabetes mellitus with stage 3 chronic kidney disease with long term current use of insulin: is stable hgb a1c 11.5 will continue toujeo 65 units twice daily and novolog 10 units with meals.   5. CKD stage 3 due to type 2 diabetes mellitus: is stable bun 55; creat 2.16 will monitor   6. Chronic constipation: is stable will continue metamucil daily  And miralax daily as needed  7. COPD: is stable is 02 dependent   8. Obesity class III BMI 40-49.9 (morbid obesity) without change BMI 44.35 will monitor   9. BPH without urinary obstruction: is stable will continue flomax 0.4 mg daily   10. Chronic gout due to renal impairment: is without recent flares will continue allopurinol 200 mg daily   11. Depression with anxiety: does have history of panic attacks; will continue xanax 0.5 mg twice daily for total of 14 days.    Will check bmp on 08-03-18 Will get ua/c&s      MD is aware of resident's narcotic use and is in agreement with current plan of care. We will attempt to wean resident as apropriate   Ok Edwards NP [facility]  Contact [phone] Monday through Friday 8am- 5pm  After hours call [phone]

## 2018-07-28 ENCOUNTER — Non-Acute Institutional Stay (SKILLED_NURSING_FACILITY): Payer: PPO | Admitting: Internal Medicine

## 2018-07-28 ENCOUNTER — Encounter: Payer: Self-pay | Admitting: Internal Medicine

## 2018-07-28 DIAGNOSIS — G4730 Sleep apnea, unspecified: Secondary | ICD-10-CM

## 2018-07-28 DIAGNOSIS — I5042 Chronic combined systolic (congestive) and diastolic (congestive) heart failure: Secondary | ICD-10-CM

## 2018-07-28 DIAGNOSIS — N183 Chronic kidney disease, stage 3 unspecified: Secondary | ICD-10-CM

## 2018-07-28 DIAGNOSIS — I200 Unstable angina: Secondary | ICD-10-CM | POA: Diagnosis not present

## 2018-07-28 DIAGNOSIS — Z794 Long term (current) use of insulin: Secondary | ICD-10-CM | POA: Diagnosis not present

## 2018-07-28 DIAGNOSIS — N400 Enlarged prostate without lower urinary tract symptoms: Secondary | ICD-10-CM | POA: Diagnosis not present

## 2018-07-28 DIAGNOSIS — E1122 Type 2 diabetes mellitus with diabetic chronic kidney disease: Secondary | ICD-10-CM | POA: Diagnosis not present

## 2018-07-28 DIAGNOSIS — I10 Essential (primary) hypertension: Secondary | ICD-10-CM

## 2018-07-28 DIAGNOSIS — J449 Chronic obstructive pulmonary disease, unspecified: Secondary | ICD-10-CM

## 2018-07-28 NOTE — Progress Notes (Signed)
Provider:  Veleta Miners, MD Location:  [place] Room Number: [place] of Service:  SNF [phone])  PCP: [name], MD Patient Care Team: [name], MD as PCP - General (Cardiology) Satira Sark, MD as PCP - Cardiology (Cardiology)  Extended Emergency Contact Information Primary Emergency Contact: [facility] Address: [address], [place] [address] [place] Phone: [phone] Relation: Spouse Secondary Emergency Contact: [facility] Address: [place], [place] [address] [place] Phone: [phone] Relation: Daughter  Code Status: DNR Goals of Care: Advanced Directive information Advanced Directives 07/28/2018  Does Patient Have a Medical Advance Directive? Yes  Type of Advance Directive Out of facility DNR (pink MOST or yellow form)  Does patient want to make changes to medical advance directive? No - Patient declined  Copy of [place] in Chart? -  Would patient like information on creating a medical advance directive? No - Patient declined  Pre-existing out of facility DNR order (yellow form or pink MOST form) Yellow form placed in chart (order not valid for inpatient use)      Chief Complaint  Patient presents with  . New Admit To SNF    Admission    HPI: Patient is a 85 y.o. male seen today for admission to SNF for therapy. Was admitted on 04/09-04/14 for Unstable Angina  He has h/o CAD s/p CABG and Stent placement, COPD, CKD stage 4, chronic diastolic CHF, COPD and OSA on CPAP.  He presented to ED with Chest pain at rest for 1 day. Was found ot have mildily elevated Troponin .He was started on heparin for 72 hours was evaluated by cardiology and  they recommended medical management.  Repeat echo showed LVEF of 40 to 45% Imdur and hydralazine was added. Already on Aspirin and Plavix, Beta Blocker dose was  Increased. Was diuresed but on home  dose of Diuretics  now .EKG did not show any acute ST segment abnormality.  Patient was very anxious today was complaining of nausea.  He is also little confused.  Lives with his wife who seems to be very active and drives.  Patient himself was walking with a walker and was mostly sedentary. Denies any cough ,SOB or chest pain  Past Medical History:  Diagnosis Date  . Allergic rhinitis   . Anxiety   . Arthritis   . COPD (chronic obstructive pulmonary disease) ([place])   . Coronary atherosclerosis of native coronary artery    a. CABG x 4 in 1994. Previous stent placement to SVG to distal RCA;  b. DES to native Cx in 2006;  c. DES SVG to RCA 05/08/11; d. Inf MI/Cath/PCI: LM 25, LAD 100, LCX patent stent, 50 into OM, RI 60-70, RCA 100, LIMA->LAD nl, VG->Diag 100 old, VG->RCA/PL 99 (DES x 1), EF 50-55%.  . Depression    With anxious features/hx panic attacks  . Essential hypertension, benign   . GERD (gastroesophageal reflux disease)   . Hyperlipidemia    Not on statin 2/2 hx of intolerance.   . Morbid obesity ([place])   . Nephrolithiasis   . Pneumonia   . Sleep apnea   . ST elevation myocardial infarction (STEMI) of inferior wall ([place])    4/14 - DES SVG to PDA  . Type 2 diabetes mellitus ([place])    Past Surgical History:  Procedure Laterality Date  . CORONARY ARTERY BYPASS GRAFT  1994  . EYE SURGERY    . LEFT HEART CATHETERIZATION WITH CORONARY ANGIOGRAM N/A 05/08/2011   Procedure: LEFT HEART CATHETERIZATION WITH CORONARY ANGIOGRAM;  Surgeon: [name], MD;  Location: [facility] CATH LAB;  Service: Cardiovascular;  Laterality: N/A;  . LEFT HEART CATHETERIZATION WITH CORONARY ANGIOGRAM N/A 08/08/2012   Procedure: LEFT HEART CATHETERIZATION WITH CORONARY ANGIOGRAM;  Surgeon: [name], MD;  Location: [facility] CATH LAB;  Service: Cardiovascular;  Laterality: N/A;  . PERCUTANEOUS CORONARY STENT INTERVENTION (PCI-S) Right 05/08/2011   Procedure: PERCUTANEOUS CORONARY STENT INTERVENTION  (PCI-S);  Surgeon: [name], MD;  Location: [facility] CATH LAB;  Service: Cardiovascular;  Laterality: Right;  . PERCUTANEOUS CORONARY STENT INTERVENTION (PCI-S) N/A 08/08/2012   Procedure: PERCUTANEOUS CORONARY STENT INTERVENTION (PCI-S);  Surgeon: [name], MD;  Location: [facility] CATH LAB;  Service: Cardiovascular;  Laterality: N/A;    reports that he has never smoked. He has never used smokeless tobacco. He reports that he does not drink alcohol or use drugs. Social History   Socioeconomic History  . Marital status: Married    Spouse name: Not on file  . Number of children: Not on file  . Years of education: Not on file  . Highest education level: Not on file  Occupational History  . Not on file  Social Needs  . Financial resource strain: Not on file  . Food insecurity:    Worry: Not on file    Inability: Not on file  . Transportation needs:    Medical: Not on file    Non-medical: Not on file  Tobacco Use  . Smoking status: Never Smoker  . Smokeless tobacco: Never Used  Substance and Sexual Activity  . Alcohol use: No    Alcohol/week: 0.0 standard drinks  . Drug use: No  . Sexual activity: Never  Lifestyle  . Physical activity:    Days per week: Not on file    Minutes per session: Not on file  . Stress: Not on file  Relationships  . Social connections:    Talks on phone: Not on file    Gets together: Not on file    Attends religious service: Not on file    Active member of club or organization: Not on file    Attends meetings of clubs or organizations: Not on file    Relationship status: Not on file  . Intimate partner violence:    Fear of current or ex partner: Not on file    Emotionally abused: Not on file    Physically abused: Not on file    Forced sexual activity: Not on file  Other Topics Concern  . Not on file  Social History Narrative  . Not on file    Functional Status Survey:    Family History  Problem Relation Age of Onset  . Heart  attack Mother        Age 16    Health Maintenance  Topic Date Due  . FOOT EXAM  08/26/2018 (Originally 04/27/1943)  . OPHTHALMOLOGY EXAM  08/26/2018 (Originally 04/27/1943)  . URINE MICROALBUMIN  08/26/2018 (Originally 04/27/1943)  . TETANUS/TDAP  08/26/2018 (Originally 04/27/1952)  . PNA vac Low Risk Adult (2 of 2 - PCV13) 08/26/2018 (Originally 05/27/2017)  . INFLUENZA VACCINE  11/12/2018  . HEMOGLOBIN A1C  01/20/2019    Allergies  Allergen Reactions  . Zocor [Simvastatin] Other (See Comments)    fatigue  . Lipitor [Atorvastatin] Other (  See Comments)    Muscle cramping    Outpatient Encounter Medications as of 07/28/2018  Medication Sig  . allopurinol (ZYLOPRIM) 100 MG tablet Take 2 tablets (200 mg total) by mouth daily.  Marland Kitchen ALPRAZolam (XANAX) 0.5 MG tablet Take 1 tablet (0.5 mg total) by mouth 2 (two) times daily as needed for up to 13 days for sleep or anxiety.  Marland Kitchen aspirin EC 81 MG tablet Take 81 mg by mouth every morning.  . clopidogrel (PLAVIX) 75 MG tablet take 1 tablet by mouth once daily WITH BREAKFAST  . hydrALAZINE (APRESOLINE) 10 MG tablet Take 1 tablet (10 mg total) by mouth every 8 (eight) hours.  . insulin aspart (NOVOLOG) 100 UNIT/ML injection Inject 10 Units into the skin 3 (three) times daily with meals. Give only if eats 50% or more of meal.  . isosorbide mononitrate (IMDUR) 30 MG 24 hr tablet Take 1 tablet (30 mg total) by mouth 2 (two) times daily.  . metoprolol tartrate (LOPRESSOR) 50 MG tablet Take 1 tablet (50 mg total) by mouth 2 (two) times daily.  . nitroGLYCERIN (NITROLINGUAL) 0.4 MG/SPRAY spray use 1-2 sprays under the tongue if needed for chest pain *MAXIMUM OF 3 SPRAYS IN 15 MINUTES  . NON FORMULARY Diet Type: NAS, Consistent Carbohydrate  . oxyCODONE (OXY IR/ROXICODONE) 5 MG immediate release tablet Take 0.5-1 tablets (2.5-5 mg total) by mouth every 6 (six) hours as needed for up to 5 days for severe pain.  . OXYGEN Inhale 2 L/min into the lungs continuous.   . polyethylene glycol powder (GLYCOLAX/MIRALAX) powder Take 17 g by mouth daily as needed (constipation).   . psyllium (METAMUCIL) 58.6 % powder Take by mouth every morning.  . tamsulosin (FLOMAX) 0.4 MG CAPS capsule Take 0.4 mg by mouth every evening.   . torsemide (DEMADEX) 20 MG tablet Give 2 tablets (40 mg) by mouth on Even days and 1 tablet (20 mg) once a day on Odd days  . TOUJEO SOLOSTAR 300 UNIT/ML SOPN Inject 65 Units into the skin 2 (two) times daily.  Marland Kitchen UNABLE TO FIND CPAP from home while sleeping, at previous home settings   No facility-administered encounter medications on file as of 07/28/2018.     Review of Systems  Constitutional: Positive for activity change, appetite change and fatigue.  HENT: Negative.   Respiratory: Negative.   Cardiovascular: Negative.   Gastrointestinal: Positive for nausea.  Genitourinary: Negative.   Musculoskeletal: Negative.   Skin: Negative.   Neurological: Positive for weakness.  Psychiatric/Behavioral: Positive for confusion. The patient is nervous/anxious.     Vitals:   07/28/18 0837  BP: (!) 162/74  Pulse: 75  Resp: 17  Temp: 98.6 F (37 C)  SpO2: 95%  Weight: 264 lb 8 oz (120 kg)  Height: 5\' 5"  (1.651 m)   Body mass index is 44.02 kg/m. Physical Exam Vitals signs reviewed.  Constitutional:      Appearance: Normal appearance. He is obese.  HENT:     Head: Normocephalic.     Nose: Nose normal.     Mouth/Throat:     Mouth: Mucous membranes are moist.     Pharynx: Oropharynx is clear.  Eyes:     Pupils: Pupils are equal, round, and reactive to light.  Neck:     Musculoskeletal: Neck supple.  Cardiovascular:     Rate and Rhythm: Normal rate and regular rhythm.     Pulses: Normal pulses.     Heart sounds: Normal heart sounds.  Pulmonary:  Effort: Pulmonary effort is normal. No respiratory distress.     Breath sounds: Normal breath sounds. No wheezing or rales.  Abdominal:     General: Abdomen is flat. Bowel  sounds are normal. There is no distension.     Palpations: Abdomen is soft.     Tenderness: There is no abdominal tenderness. There is no guarding.  Musculoskeletal:     Comments: Mild Swelling bilateral  Skin:    General: Skin is warm and dry.  Neurological:     General: No focal deficit present.     Mental Status: He is alert.  Psychiatric:     Comments: Was anxious and will sometimes get confused and repeat himself     Labs reviewed: Basic Metabolic Panel: Recent Labs    07/22/18 0309 07/23/18 0647 07/24/18 0606  NA 135 134* 133*  K 4.1 3.8 3.8  CL 92* 89* 91*  CO2 33* 33* 30  GLUCOSE 255* 208* 224*  BUN 57* 55* 55*  CREATININE 2.22* 2.16* 2.34*  CALCIUM 8.9 8.7* 8.4*  PHOS  --  2.2*  --    Liver Function Tests: Recent Labs    07/23/18 0647  ALBUMIN 3.8   No results for input(s): LIPASE, AMYLASE in the last 8760 hours. No results for input(s): AMMONIA in the last 8760 hours. CBC: Recent Labs    07/21/18 0835 07/22/18 0309 07/24/18 0606  WBC 8.8 7.2 10.3  NEUTROABS 6.2  --   --   HGB 13.0 12.4* 13.7  HCT 40.5 40.4 41.9  MCV 91.6 93.7 90.5  PLT 155 148* 144*   Cardiac Enzymes: Recent Labs    07/21/18 1530 07/21/18 2056 07/22/18 0309  TROPONINI 0.77* 0.91* 0.76*   BNP: Invalid input(s): POCBNP Lab Results  Component Value Date   HGBA1C 11.5 (H) 07/21/2018   No results found for: TSH No results found for: VITAMINB12 No results found for: FOLATE No results found for: IRON, TIBC, FERRITIN  Imaging and Procedures obtained prior to SNF admission: No results found.  Assessment/Plan Unstable angina  Conservative management On Imdur, Beta blocker ,aspirin and Plavix,  Does not tolerate statin Follow up with Cardiology Also Started on Hydralazine  Nausea Will use Zofran PRN for now  Essential hypertension, benign Continue on Beta Blocker   Chronic combined systolic and diastolic CHF  Weight is down from few months ago Continue on  Demadex and potassium home dose Sleep apnea, unspecified type Tolerating CPAP COPD On Chronic Oxygen  Type 2 diabetes mellitus  A1C was on 11.5 BS are doing well in facility Continue to monitor On Home dose of Toujeo  CKD (chronic kidney disease) stage 4  Creat stable Near his Baseline  BPH without urinary obstruction Continue on Flomax Gout On Allopurinal Anxiety Will continue Xanax for now   Family/ staff Communication:   Labs/tests ordered: Follow up BMP pending  Total time spent in this patient care encounter was  45_  minutes; greater than 50% of the visit spent counseling patient and staff, reviewing records , Labs and coordinating care for problems addressed at this encounter.

## 2018-07-29 ENCOUNTER — Other Ambulatory Visit (HOSPITAL_COMMUNITY)
Admission: RE | Admit: 2018-07-29 | Discharge: 2018-07-29 | Disposition: A | Discharge status: Home / Self Care | Payer: PPO | Source: Skilled Nursing Facility | Attending: Adult Health | Admitting: Adult Health

## 2018-07-29 ENCOUNTER — Non-Acute Institutional Stay (SKILLED_NURSING_FACILITY): Payer: PPO | Admitting: Adult Health

## 2018-07-29 ENCOUNTER — Encounter: Payer: Self-pay | Admitting: Adult Health

## 2018-07-29 DIAGNOSIS — N183 Chronic kidney disease, stage 3 unspecified: Secondary | ICD-10-CM

## 2018-07-29 DIAGNOSIS — I5033 Acute on chronic diastolic (congestive) heart failure: Secondary | ICD-10-CM

## 2018-07-29 DIAGNOSIS — Z794 Long term (current) use of insulin: Secondary | ICD-10-CM

## 2018-07-29 DIAGNOSIS — J449 Chronic obstructive pulmonary disease, unspecified: Secondary | ICD-10-CM | POA: Diagnosis not present

## 2018-07-29 DIAGNOSIS — M1A39X0 Chronic gout due to renal impairment, multiple sites, without tophus (tophi): Secondary | ICD-10-CM | POA: Diagnosis not present

## 2018-07-29 DIAGNOSIS — N390 Urinary tract infection, site not specified: Secondary | ICD-10-CM | POA: Insufficient documentation

## 2018-07-29 DIAGNOSIS — E1122 Type 2 diabetes mellitus with diabetic chronic kidney disease: Secondary | ICD-10-CM

## 2018-07-29 LAB — CBC WITH DIFFERENTIAL/PLATELET
Abs Immature Granulocytes: 0.09 10*3/uL — ABNORMAL HIGH (ref 0.00–0.07)
Basophils Absolute: 0 10*3/uL (ref 0.0–0.1)
Basophils Relative: 0 %
Eosinophils Absolute: 0 10*3/uL (ref 0.0–0.5)
Eosinophils Relative: 0 %
HCT: 46.7 % (ref 39.0–52.0)
Hemoglobin: 15 g/dL (ref 13.0–17.0)
Immature Granulocytes: 1 %
Lymphocytes Relative: 10 %
Lymphs Abs: 1.1 10*3/uL (ref 0.7–4.0)
MCH: 29.2 pg (ref 26.0–34.0)
MCHC: 32.1 g/dL (ref 30.0–36.0)
MCV: 91 fL (ref 80.0–100.0)
Monocytes Absolute: 1.3 10*3/uL — ABNORMAL HIGH (ref 0.1–1.0)
Monocytes Relative: 12 %
Neutro Abs: 8.1 10*3/uL — ABNORMAL HIGH (ref 1.7–7.7)
Neutrophils Relative %: 77 %
Platelets: 282 10*3/uL (ref 150–400)
RBC: 5.13 MIL/uL (ref 4.22–5.81)
RDW: 13.2 % (ref 11.5–15.5)
WBC: 10.7 10*3/uL — ABNORMAL HIGH (ref 4.0–10.5)
nRBC: 0 % (ref 0.0–0.2)

## 2018-07-29 LAB — URINALYSIS, COMPLETE (UACMP) WITH MICROSCOPIC
Bilirubin Urine: NEGATIVE
Glucose, UA: 50 mg/dL — AB
Ketones, ur: NEGATIVE mg/dL
Nitrite: NEGATIVE
Protein, ur: 30 mg/dL — AB
Specific Gravity, Urine: 1.011 (ref 1.005–1.030)
pH: 6 (ref 5.0–8.0)

## 2018-07-29 NOTE — Progress Notes (Signed)
Location:   [place] Room Number: [place] of Service:  SNF (31)   CODE STATUS: DNR  Allergies  Allergen Reactions  . Zocor [Simvastatin] Other (See Comments)    fatigue  . Lipitor [Atorvastatin] Other (See Comments)    Muscle cramping    Chief Complaint  Patient presents with  . Acute Visit    72 hour care plan meeting    HPI:  We have come together for his 71 hour care plan meeting. He has a walker; 02 neb machine at home. His goal is to return. He will need home health and will need sitters; his family is going to arrange sitters. He has been more confused; no fevers; has supra pubic back; and has difficulty finding his words. He is having right great toe pain with a history of gout. There is no culture report back yet on his urine culture. His goal remains to return back home.    Past Medical History:  Diagnosis Date  . Allergic rhinitis   . Anxiety   . Arthritis   . COPD (chronic obstructive pulmonary disease) ([place])   . Coronary atherosclerosis of native coronary artery    a. CABG x 4 in 1994. Previous stent placement to SVG to distal RCA;  b. DES to native Cx in 2006;  c. DES SVG to RCA 05/08/11; d. Inf MI/Cath/PCI: LM 25, LAD 100, LCX patent stent, 50 into OM, RI 60-70, RCA 100, LIMA->LAD nl, VG->Diag 100 old, VG->RCA/PL 99 (DES x 1), EF 50-55%.  . Depression    With anxious features/hx panic attacks  . Essential hypertension, benign   . GERD (gastroesophageal reflux disease)   . Hyperlipidemia    Not on statin 2/2 hx of intolerance.   . Morbid obesity ([place])   . Nephrolithiasis   . Pneumonia   . Sleep apnea   . ST elevation myocardial infarction (STEMI) of inferior wall ([place])    4/14 - DES SVG to PDA  . Type 2 diabetes mellitus ([place])     Past Surgical History:  Procedure Laterality Date  . CORONARY ARTERY BYPASS GRAFT  1994  . EYE SURGERY    . LEFT HEART CATHETERIZATION WITH CORONARY ANGIOGRAM N/A 05/08/2011   Procedure:  LEFT HEART CATHETERIZATION WITH CORONARY ANGIOGRAM;  Surgeon: [name], MD;  Location: [facility] CATH LAB;  Service: Cardiovascular;  Laterality: N/A;  . LEFT HEART CATHETERIZATION WITH CORONARY ANGIOGRAM N/A 08/08/2012   Procedure: LEFT HEART CATHETERIZATION WITH CORONARY ANGIOGRAM;  Surgeon: [name], MD;  Location: [facility] CATH LAB;  Service: Cardiovascular;  Laterality: N/A;  . PERCUTANEOUS CORONARY STENT INTERVENTION (PCI-S) Right 05/08/2011   Procedure: PERCUTANEOUS CORONARY STENT INTERVENTION (PCI-S);  Surgeon: [name], MD;  Location: [facility] CATH LAB;  Service: Cardiovascular;  Laterality: Right;  . PERCUTANEOUS CORONARY STENT INTERVENTION (PCI-S) N/A 08/08/2012   Procedure: PERCUTANEOUS CORONARY STENT INTERVENTION (PCI-S);  Surgeon: [name], MD;  Location: [facility] CATH LAB;  Service: Cardiovascular;  Laterality: N/A;    Social History   Socioeconomic History  . Marital status: Married    Spouse name: Not on file  . Number of children: Not on file  . Years of education: Not on file  . Highest education level: Not on file  Occupational History  . Not on file  Social Needs  . Financial resource strain: Not on file  . Food insecurity:    Worry: Not on file    Inability: Not on file  .  Transportation needs:    Medical: Not on file    Non-medical: Not on file  Tobacco Use  . Smoking status: Never Smoker  . Smokeless tobacco: Never Used  Substance and Sexual Activity  . Alcohol use: No    Alcohol/week: 0.0 standard drinks  . Drug use: No  . Sexual activity: Never  Lifestyle  . Physical activity:    Days per week: Not on file    Minutes per session: Not on file  . Stress: Not on file  Relationships  . Social connections:    Talks on phone: Not on file    Gets together: Not on file    Attends religious service: Not on file    Active member of club or organization: Not on file    Attends meetings of clubs or organizations: Not on file    Relationship  status: Not on file  . Intimate partner violence:    Fear of current or ex partner: Not on file    Emotionally abused: Not on file    Physically abused: Not on file    Forced sexual activity: Not on file  Other Topics Concern  . Not on file  Social History Narrative  . Not on file   Family History  Problem Relation Age of Onset  . Heart attack Mother        Age 28      VITAL SIGNS BP (!) 164/71   Pulse 78   Temp 98.1 F (36.7 C)   Resp 18   Ht 5\' 5"  (1.651 m)   Wt 265 lb (120.2 kg)   SpO2 97%   BMI 44.10 kg/m   Outpatient Encounter Medications as of 07/29/2018  Medication Sig  . allopurinol (ZYLOPRIM) 100 MG tablet Take 2 tablets (200 mg total) by mouth daily.  Marland Kitchen ALPRAZolam (XANAX) 0.5 MG tablet Take 1 tablet (0.5 mg total) by mouth 2 (two) times daily as needed for up to 13 days for sleep or anxiety.  Marland Kitchen aspirin EC 81 MG tablet Take 81 mg by mouth every morning.  . clopidogrel (PLAVIX) 75 MG tablet take 1 tablet by mouth once daily WITH BREAKFAST  . hydrALAZINE (APRESOLINE) 10 MG tablet Take 1 tablet (10 mg total) by mouth every 8 (eight) hours.  . insulin aspart (NOVOLOG) 100 UNIT/ML injection Inject 10 Units into the skin 3 (three) times daily with meals. Give only if eats 50% or more of meal.  . isosorbide mononitrate (IMDUR) 30 MG 24 hr tablet Take 1 tablet (30 mg total) by mouth 2 (two) times daily.  . metoprolol tartrate (LOPRESSOR) 50 MG tablet Take 1 tablet (50 mg total) by mouth 2 (two) times daily.  . nitroGLYCERIN (NITROLINGUAL) 0.4 MG/SPRAY spray use 1-2 sprays under the tongue if needed for chest pain *MAXIMUM OF 3 SPRAYS IN 15 MINUTES  . NON FORMULARY Diet Type: NAS, Consistent Carbohydrate  . ondansetron (ZOFRAN) 4 MG tablet Take 4 mg by mouth every 8 (eight) hours as needed for nausea or vomiting.  Marland Kitchen oxyCODONE (OXY IR/ROXICODONE) 5 MG immediate release tablet Take 0.5-1 tablets (2.5-5 mg total) by mouth every 6 (six) hours as needed for up to 5 days for  severe pain.  . OXYGEN Inhale 2 L/min into the lungs continuous.  . polyethylene glycol powder (GLYCOLAX/MIRALAX) powder Take 17 g by mouth daily as needed (constipation).   . psyllium (METAMUCIL) 58.6 % powder Take by mouth every morning.  . tamsulosin (FLOMAX) 0.4 MG CAPS capsule Take 0.4  mg by mouth every evening.   . torsemide (DEMADEX) 20 MG tablet Give 2 tablets (40 mg) by mouth on Even days and 1 tablet (20 mg) once a day on Odd days  . TOUJEO SOLOSTAR 300 UNIT/ML SOPN Inject 65 Units into the skin 2 (two) times daily.  Marland Kitchen UNABLE TO FIND CPAP from home while sleeping, at previous home settings   No facility-administered encounter medications on file as of 07/29/2018.      SIGNIFICANT DIAGNOSTIC EXAMS  PREVIOUS:  07-21-18: 2-d echo:  1. Images are limited.  2. The left ventricle had a visually estimated ejection fraction of approximately 40-45%. Unable to assess focal wall motion. The cavity size was normal. There is moderately increased left ventricular wall thickness. Left ventricular diastolic Doppler  parameters are consistent with impaired relaxation.  3. The cavity was mildly enlarged.  4. The aortic valve is tricuspid. Moderate calcification of the aortic valve. Mild to moderate aortic annular calcification noted. Possible mild aortic stenosis.  5. The mitral valve is grossly normal. There is mild to moderate mitral annular calcification present.  6. The tricuspid valve was grossly normal.  7. The aortic root is normal in size and structure.  07-21-18: chest x-ray: Low lung volumes, with bibasilar opacities, potentially combination of atelectasis, scarring, and/or consolidation. Surgical changes of median sternotomy.  NO NEW EXAMS.    LABS REVIEWED PREVIOUS:   07-21-18: wbc 8.8; hgb 13.0; hct 40.5; mcv 91.6; plt 155; glucose 341l bun 59; creat 2.31; k+ 4.1; na++ 134; ca 9.4 BNP 126.0 hgb a1c 11.5  07-23-18: glucose 208; bun 55; creat 2.16; k+ 3.8; na++ 134; ca 8.7; phos 2.2;  albumin 3.8   NO NEW LABS.    Review of Systems  Constitutional: Positive for malaise/fatigue.  Respiratory: Negative for cough and shortness of breath.   Cardiovascular: Negative for chest pain, palpitations and leg swelling.  Gastrointestinal: Negative for abdominal pain, constipation and heartburn.  Genitourinary:        Has suprapubic pain with hesitancy and urgency    Musculoskeletal: Negative for back pain, joint pain and myalgias.  Skin: Negative.   Neurological: Negative for dizziness.  Psychiatric/Behavioral: The patient is not nervous/anxious.     Physical Exam Constitutional:      General: He is not in acute distress.    Appearance: He is morbidly obese. He is not diaphoretic.  Neck:     Musculoskeletal: Neck supple.     Thyroid: No thyromegaly.  Cardiovascular:     Rate and Rhythm: Normal rate and regular rhythm.     Pulses: Normal pulses.     Heart sounds: Normal heart sounds.     Comments: History of CABG  Pulmonary:     Effort: Pulmonary effort is normal. No respiratory distress.     Breath sounds: Normal breath sounds.     Comments: 02 dependent  Abdominal:     General: Bowel sounds are normal. There is no distension.     Palpations: Abdomen is soft.     Tenderness: There is no abdominal tenderness.  Musculoskeletal:     Right lower leg: Edema present.     Left lower leg: Edema present.     Comments: Trace bilateral lower extremity edema Is able to move all extremities    Lymphadenopathy:     Cervical: No cervical adenopathy.  Skin:    General: Skin is warm and dry.  Neurological:     Mental Status: He is alert. He is disoriented.  Psychiatric:  Mood and Affect: Mood normal.       ASSESSMENT/ PLAN:  TODAY:   1.Acute on chronic diastolic heart failure/Secondary cardiomyopathy:  2. Type 2 diabetes mellitus with stage 3 chronic kidney disease with long term current use of insulin:  3. COPD: is stable is 02 dependent  4. UTI: 5. Chronic  gout due to renal impairment without tophus  Will give colchicine 0.6 mg daily for 3 days cipro 500 gm twice daily for one week Will get cbc      MD is aware of resident's narcotic use and is in agreement with current plan of care. We will attempt to wean resident as apropriate   Ok Edwards NP [facility]  Contact [phone] Monday through Friday 8am- 5pm  After hours call [phone]

## 2018-07-31 LAB — URINE CULTURE: Culture: >=100000 — AB

## 2018-08-01 ENCOUNTER — Encounter: Payer: Self-pay | Admitting: Adult Health

## 2018-08-01 ENCOUNTER — Non-Acute Institutional Stay (SKILLED_NURSING_FACILITY): Payer: PPO | Admitting: Adult Health

## 2018-08-01 ENCOUNTER — Telehealth (INDEPENDENT_AMBULATORY_CARE_PROVIDER_SITE_OTHER): Payer: PPO | Admitting: Physician Assistant

## 2018-08-01 VITALS — BP 130/80 | HR 100 | Temp 97.8°F | Resp 22 | Wt 265.7 lb

## 2018-08-01 DIAGNOSIS — E785 Hyperlipidemia, unspecified: Secondary | ICD-10-CM

## 2018-08-01 DIAGNOSIS — N390 Urinary tract infection, site not specified: Secondary | ICD-10-CM

## 2018-08-01 DIAGNOSIS — N183 Chronic kidney disease, stage 3 (moderate): Secondary | ICD-10-CM

## 2018-08-01 DIAGNOSIS — I5042 Chronic combined systolic (congestive) and diastolic (congestive) heart failure: Secondary | ICD-10-CM | POA: Diagnosis not present

## 2018-08-01 DIAGNOSIS — I429 Cardiomyopathy, unspecified: Secondary | ICD-10-CM

## 2018-08-01 DIAGNOSIS — I25810 Atherosclerosis of coronary artery bypass graft(s) without angina pectoris: Secondary | ICD-10-CM

## 2018-08-01 DIAGNOSIS — B9620 Unspecified Escherichia coli [E. coli] as the cause of diseases classified elsewhere: Secondary | ICD-10-CM | POA: Diagnosis not present

## 2018-08-01 DIAGNOSIS — I248 Other forms of acute ischemic heart disease: Secondary | ICD-10-CM

## 2018-08-01 DIAGNOSIS — M1A39X0 Chronic gout due to renal impairment, multiple sites, without tophus (tophi): Secondary | ICD-10-CM

## 2018-08-01 DIAGNOSIS — I350 Nonrheumatic aortic (valve) stenosis: Secondary | ICD-10-CM | POA: Diagnosis not present

## 2018-08-01 DIAGNOSIS — E1122 Type 2 diabetes mellitus with diabetic chronic kidney disease: Secondary | ICD-10-CM

## 2018-08-01 NOTE — Patient Instructions (Signed)
Medication Instructions:  Your physician recommends that you continue on your current medications as directed. Please refer to the Current Medication list given to you today.   Labwork: Your physician recommends that you return for lab work this week.   Testing/Procedures: NONE   Follow-Up: Your physician recommends that you schedule a follow-up appointment in: 1 Month with Dr. [name]    Any Other Special Instructions Will Be Listed Below (If Applicable).  Your physician recommends that you weigh, daily, at the same time every day, and in the same amount of clothing. Please record your daily weights on the handout provided and bring it to your next appointment.  Call office for weight gain of 2 lbs overnight or 5 lbs in one week.    If you need a refill on your cardiac medications before your next appointment, please call your pharmacy. Thank you for choosing [facility]!   Low-Sodium Eating Plan Sodium, which is an element that makes up salt, helps you maintain a healthy balance of fluids in your body. Too much sodium can increase your blood pressure and cause fluid and waste to be held in your body. Your health care provider or dietitian may recommend following this plan if you have high blood pressure (hypertension), kidney disease, liver disease, or heart failure. Eating less sodium can help lower your blood pressure, reduce swelling, and protect your heart, liver, and kidneys. What are tips for following this plan? General guidelines  Most people on this plan should limit their sodium intake to 1,500-2,000 mg (milligrams) of sodium each day. Reading food labels   The Nutrition Facts label lists the amount of sodium in one serving of the food. If you eat more than one serving, you must multiply the listed amount of sodium by the number of servings.  Choose foods with less than 140 mg of sodium per serving.  Avoid foods with 300 mg of sodium or more per serving.  Shopping  Look for lower-sodium products, often labeled as "low-sodium" or "no salt added."  Always check the sodium content even if foods are labeled as "unsalted" or "no salt added".  Buy fresh foods. ? Avoid canned foods and premade or frozen meals. ? Avoid canned, cured, or processed meats  Buy breads that have less than 80 mg of sodium per slice. Cooking  Eat more home-cooked food and less restaurant, buffet, and fast food.  Avoid adding salt when cooking. Use salt-free seasonings or herbs instead of table salt or sea salt. Check with your health care provider or pharmacist before using salt substitutes.  Cook with plant-based oils, such as canola, sunflower, or olive oil. Meal planning  When eating at a restaurant, ask that your food be prepared with less salt or no salt, if possible.  Avoid foods that contain MSG (monosodium glutamate). MSG is sometimes added to [place] food, bouillon, and some canned foods. What foods are recommended? The items listed may not be a complete list. Talk with your dietitian about what dietary choices are best for you. Grains Low-sodium cereals, including oats, puffed wheat and rice, and shredded wheat. Low-sodium crackers. Unsalted rice. Unsalted pasta. Low-sodium bread. Whole-grain breads and whole-grain pasta. Vegetables Fresh or frozen vegetables. "No salt added" canned vegetables. "No salt added" tomato sauce and paste. Low-sodium or reduced-sodium tomato and vegetable juice. Fruits Fresh, frozen, or canned fruit. Fruit juice. Meats and other protein foods Fresh or frozen (no salt added) meat, poultry, seafood, and fish. Low-sodium canned tuna and salmon. Unsalted  nuts. Dried peas, beans, and lentils without added salt. Unsalted canned beans. Eggs. Unsalted nut butters. Dairy Milk. Soy milk. Cheese that is naturally low in sodium, such as ricotta cheese, fresh mozzarella, or Swiss cheese Low-sodium or reduced-sodium cheese. Cream cheese.  Yogurt. Fats and oils Unsalted butter. Unsalted margarine with no trans fat. Vegetable oils such as canola or olive oils. Seasonings and other foods Fresh and dried herbs and spices. Salt-free seasonings. Low-sodium mustard and ketchup. Sodium-free salad dressing. Sodium-free light mayonnaise. Fresh or refrigerated horseradish. Lemon juice. Vinegar. Homemade, reduced-sodium, or low-sodium soups. Unsalted popcorn and pretzels. Low-salt or salt-free chips. What foods are not recommended? The items listed may not be a complete list. Talk with your dietitian about what dietary choices are best for you. Grains Instant hot cereals. Bread stuffing, pancake, and biscuit mixes. Croutons. Seasoned rice or pasta mixes. Noodle soup cups. Boxed or frozen macaroni and cheese. Regular salted crackers. Self-rising flour. Vegetables Sauerkraut, pickled vegetables, and relishes. Olives. [place] fries. Onion rings. Regular canned vegetables (not low-sodium or reduced-sodium). Regular canned tomato sauce and paste (not low-sodium or reduced-sodium). Regular tomato and vegetable juice (not low-sodium or reduced-sodium). Frozen vegetables in sauces. Meats and other protein foods Meat or fish that is salted, canned, smoked, spiced, or pickled. Bacon, ham, sausage, hotdogs, corned beef, chipped beef, packaged lunch meats, salt pork, jerky, pickled herring, anchovies, regular canned tuna, sardines, salted nuts. Dairy Processed cheese and cheese spreads. Cheese curds. Blue cheese. Feta cheese. String cheese. Regular cottage cheese. Buttermilk. Canned milk. Fats and oils Salted butter. Regular margarine. Ghee. Bacon fat. Seasonings and other foods Onion salt, garlic salt, seasoned salt, table salt, and sea salt. Canned and packaged gravies. Worcestershire sauce. Tartar sauce. Barbecue sauce. Teriyaki sauce. Soy sauce, including reduced-sodium. Steak sauce. Fish sauce. Oyster sauce. Cocktail sauce. Horseradish that you find on  the shelf. Regular ketchup and mustard. Meat flavorings and tenderizers. Bouillon cubes. Hot sauce and Tabasco sauce. Premade or packaged marinades. Premade or packaged taco seasonings. Relishes. Regular salad dressings. Salsa. Potato and tortilla chips. Corn chips and puffs. Salted popcorn and pretzels. Canned or dried soups. Pizza. Frozen entrees and pot pies. Summary  Eating less sodium can help lower your blood pressure, reduce swelling, and protect your heart, liver, and kidneys.  Most people on this plan should limit their sodium intake to 1,500-2,000 mg (milligrams) of sodium each day.  Canned, boxed, and frozen foods are high in sodium. Restaurant foods, fast foods, and pizza are also very high in sodium. You also get sodium by adding salt to food.  Try to cook at home, eat more fresh fruits and vegetables, and eat less fast food, canned, processed, or prepared foods. This information is not intended to replace advice given to you by your health care provider. Make sure you discuss any questions you have with your health care provider. Document Released: 09/19/2001 Document Revised: 03/23/2016 Document Reviewed: 03/23/2016 Elsevier Interactive Patient Education  2019 [employer].

## 2018-08-01 NOTE — Addendum Note (Signed)
Addended by: Levonne Hubert on: 08/01/2018 12:29 PM   Modules accepted: Orders

## 2018-08-01 NOTE — Progress Notes (Signed)
Location:   [place] Room Number: [place] of Service:  SNF (31)   CODE STATUS: DNR  Allergies  Allergen Reactions  . Zocor [Simvastatin] Other (See Comments)    fatigue  . Lipitor [Atorvastatin] Other (See Comments)    Muscle cramping    Chief Complaint  Patient presents with  . Acute Visit    Pain Management    HPI:  He is being seen for pain management. He has ordered prn oxycodone. He is completing his short coarse of colchicine today. He denies any uncontrolled pain and states he does not feel as though he needs any narcotics. He is presently being treated for an uti. He denies any suprapubic pain no difficulty voiding.   Past Medical History:  Diagnosis Date  . Allergic rhinitis   . Anxiety   . Arthritis   . COPD (chronic obstructive pulmonary disease) ([place])   . Coronary atherosclerosis of native coronary artery    a. CABG x 4 in 1994. Previous stent placement to SVG to distal RCA;  b. DES to native Cx in 2006;  c. DES SVG to RCA 05/08/11; d. Inf MI/Cath/PCI: LM 25, LAD 100, LCX patent stent, 50 into OM, RI 60-70, RCA 100, LIMA->LAD nl, VG->Diag 100 old, VG->RCA/PL 99 (DES x 1), EF 50-55%.  . Depression    With anxious features/hx panic attacks  . Essential hypertension, benign   . GERD (gastroesophageal reflux disease)   . Hyperlipidemia    Not on statin 2/2 hx of intolerance.   . Morbid obesity ([place])   . Nephrolithiasis   . Pneumonia   . Sleep apnea   . ST elevation myocardial infarction (STEMI) of inferior wall ([place])    4/14 - DES SVG to PDA  . Type 2 diabetes mellitus ([place])     Past Surgical History:  Procedure Laterality Date  . CORONARY ARTERY BYPASS GRAFT  1994  . EYE SURGERY    . LEFT HEART CATHETERIZATION WITH CORONARY ANGIOGRAM N/A 05/08/2011   Procedure: LEFT HEART CATHETERIZATION WITH CORONARY ANGIOGRAM;  Surgeon: [name], MD;  Location: [facility] CATH LAB;  Service: Cardiovascular;  Laterality: N/A;  . LEFT  HEART CATHETERIZATION WITH CORONARY ANGIOGRAM N/A 08/08/2012   Procedure: LEFT HEART CATHETERIZATION WITH CORONARY ANGIOGRAM;  Surgeon: [name], MD;  Location: [facility] CATH LAB;  Service: Cardiovascular;  Laterality: N/A;  . PERCUTANEOUS CORONARY STENT INTERVENTION (PCI-S) Right 05/08/2011   Procedure: PERCUTANEOUS CORONARY STENT INTERVENTION (PCI-S);  Surgeon: [name], MD;  Location: [facility] CATH LAB;  Service: Cardiovascular;  Laterality: Right;  . PERCUTANEOUS CORONARY STENT INTERVENTION (PCI-S) N/A 08/08/2012   Procedure: PERCUTANEOUS CORONARY STENT INTERVENTION (PCI-S);  Surgeon: [name], MD;  Location: [facility] CATH LAB;  Service: Cardiovascular;  Laterality: N/A;    Social History   Socioeconomic History  . Marital status: Married    Spouse name: Not on file  . Number of children: Not on file  . Years of education: Not on file  . Highest education level: Not on file  Occupational History  . Not on file  Social Needs  . Financial resource strain: Not on file  . Food insecurity:    Worry: Not on file    Inability: Not on file  . Transportation needs:    Medical: Not on file    Non-medical: Not on file  Tobacco Use  . Smoking status: Never Smoker  . Smokeless tobacco: Never Used  Substance and Sexual Activity  .  Alcohol use: No    Alcohol/week: 0.0 standard drinks  . Drug use: No  . Sexual activity: Never  Lifestyle  . Physical activity:    Days per week: Not on file    Minutes per session: Not on file  . Stress: Not on file  Relationships  . Social connections:    Talks on phone: Not on file    Gets together: Not on file    Attends religious service: Not on file    Active member of club or organization: Not on file    Attends meetings of clubs or organizations: Not on file    Relationship status: Not on file  . Intimate partner violence:    Fear of current or ex partner: Not on file    Emotionally abused: Not on file    Physically abused: Not  on file    Forced sexual activity: Not on file  Other Topics Concern  . Not on file  Social History Narrative  . Not on file   Family History  Problem Relation Age of Onset  . Heart attack Mother        Age 59      VITAL SIGNS BP (!) 135/98   Pulse (!) 108   Temp 97.8 F (36.6 C)   Resp (!) 23   Ht 5\' 5"  (1.651 m)   Wt 265 lb 11.2 oz (120.5 kg)   SpO2 97%   BMI 44.21 kg/m   Outpatient Encounter Medications as of 08/01/2018  Medication Sig  . allopurinol (ZYLOPRIM) 100 MG tablet Take 2 tablets (200 mg total) by mouth daily.  Marland Kitchen ALPRAZolam (XANAX) 0.5 MG tablet Take 1 tablet (0.5 mg total) by mouth 2 (two) times daily as needed for up to 13 days for sleep or anxiety.  Marland Kitchen aspirin EC 81 MG tablet Take 81 mg by mouth every morning.  . ciprofloxacin (CIPRO) 500 MG tablet Take 500 mg by mouth 2 (two) times daily. X 7 days  . clopidogrel (PLAVIX) 75 MG tablet take 1 tablet by mouth once daily WITH BREAKFAST  . colchicine 0.6 MG tablet Take 0.6 mg by mouth daily.  . hydrALAZINE (APRESOLINE) 10 MG tablet Take 1 tablet (10 mg total) by mouth every 8 (eight) hours.  . insulin aspart (NOVOLOG) 100 UNIT/ML injection Inject 10 Units into the skin 3 (three) times daily with meals. Give only if eats 50% or more of meal.  . isosorbide mononitrate (IMDUR) 30 MG 24 hr tablet Take 1 tablet (30 mg total) by mouth 2 (two) times daily.  . metoprolol tartrate (LOPRESSOR) 50 MG tablet Take 1 tablet (50 mg total) by mouth 2 (two) times daily.  . nitroGLYCERIN (NITROLINGUAL) 0.4 MG/SPRAY spray use 1-2 sprays under the tongue if needed for chest pain *MAXIMUM OF 3 SPRAYS IN 15 MINUTES  . NON FORMULARY Diet Type: NAS, Consistent Carbohydrate  . ondansetron (ZOFRAN) 4 MG tablet Take 4 mg by mouth every 8 (eight) hours as needed for nausea or vomiting.  . OXYGEN Inhale 2 L/min into the lungs continuous.  . polyethylene glycol powder (GLYCOLAX/MIRALAX) powder Take 17 g by mouth daily as needed (constipation).    . Probiotic Product (RISA-BID PROBIOTIC) TABS Take 1 tablet by mouth 2 (two) times daily.  . psyllium (METAMUCIL) 58.6 % powder Take by mouth every morning.  . tamsulosin (FLOMAX) 0.4 MG CAPS capsule Take 0.4 mg by mouth every evening.   . torsemide (DEMADEX) 20 MG tablet Give 2 tablets (40 mg) by  mouth on Even days and 1 tablet (20 mg) once a day on Odd days  . TOUJEO SOLOSTAR 300 UNIT/ML SOPN Inject 65 Units into the skin 2 (two) times daily.  Marland Kitchen UNABLE TO FIND CPAP from home while sleeping, at previous home settings   No facility-administered encounter medications on file as of 08/01/2018.      SIGNIFICANT DIAGNOSTIC EXAMS    PREVIOUS:  07-21-18: 2-d echo:  1. Images are limited.  2. The left ventricle had a visually estimated ejection fraction of approximately 40-45%. Unable to assess focal wall motion. The cavity size was normal. There is moderately increased left ventricular wall thickness. Left ventricular diastolic Doppler  parameters are consistent with impaired relaxation.  3. The cavity was mildly enlarged.  4. The aortic valve is tricuspid. Moderate calcification of the aortic valve. Mild to moderate aortic annular calcification noted. Possible mild aortic stenosis.  5. The mitral valve is grossly normal. There is mild to moderate mitral annular calcification present.  6. The tricuspid valve was grossly normal.  7. The aortic root is normal in size and structure.  07-21-18: chest x-ray: Low lung volumes, with bibasilar opacities, potentially combination of atelectasis, scarring, and/or consolidation. Surgical changes of median sternotomy.  NO NEW EXAMS.    LABS REVIEWED PREVIOUS:   07-21-18: wbc 8.8; hgb 13.0; hct 40.5; mcv 91.6; plt 155; glucose 341l bun 59; creat 2.31; k+ 4.1; na++ 134; ca 9.4 BNP 126.0 hgb a1c 11.5  07-23-18: glucose 208; bun 55; creat 2.16; k+ 3.8; na++ 134; ca 8.7; phos 2.2; albumin 3.8   TODAY:   07-29-18: wbc 10.7; hgb 15.0; hct 46.7; mcv 91.0 plt  282  urine culture: e-coli: cipro     Review of Systems  Constitutional: Negative for malaise/fatigue.  Respiratory: Negative for cough and shortness of breath.   Cardiovascular: Negative for chest pain, palpitations and leg swelling.  Gastrointestinal: Negative for abdominal pain, constipation and heartburn.  Musculoskeletal: Negative for back pain, joint pain and myalgias.  Skin: Negative.   Neurological: Negative for dizziness.  Psychiatric/Behavioral: The patient is not nervous/anxious.     Physical Exam Constitutional:      General: He is not in acute distress.    Appearance: He is morbidly obese. He is not diaphoretic.  Neck:     Musculoskeletal: Neck supple.     Thyroid: No thyromegaly.  Cardiovascular:     Rate and Rhythm: Normal rate and regular rhythm.     Pulses: Normal pulses.     Heart sounds: Normal heart sounds.     Comments: History of CABG  Pulmonary:     Effort: Pulmonary effort is normal. No respiratory distress.     Breath sounds: Normal breath sounds.     Comments: 02 dependent  Abdominal:     General: Bowel sounds are normal. There is no distension.     Palpations: Abdomen is soft.     Tenderness: There is no abdominal tenderness.  Musculoskeletal:     Right lower leg: Edema present.     Left lower leg: Edema present.     Comments: Trace bilateral lower extremity edema Is able to move all extremities     Lymphadenopathy:     Cervical: No cervical adenopathy.  Skin:    General: Skin is warm and dry.  Neurological:     Mental Status: He is alert and oriented to person, place, and time.  Psychiatric:        Mood and Affect: Mood normal.  ASSESSMENT/ PLAN:  TODAY:   1. E coli UTI 2. Chronic gout due to renal impairment of multiple sites without tophi  Will stop oxycodone Will continue cipro Will continue to monitor his status.   MD is aware of resident's narcotic use and is in agreement with current plan of care. We will attempt to  wean resident as apropriate   Ok Edwards NP [facility]  Contact [phone] Monday through Friday 8am- 5pm  After hours call [phone]

## 2018-08-02 ENCOUNTER — Encounter: Payer: Self-pay | Admitting: Adult Health

## 2018-08-02 ENCOUNTER — Non-Acute Institutional Stay (SKILLED_NURSING_FACILITY): Payer: PPO | Admitting: Adult Health

## 2018-08-02 ENCOUNTER — Encounter (HOSPITAL_COMMUNITY)
Admission: RE | Admit: 2018-08-02 | Discharge: 2018-08-02 | Disposition: A | Discharge status: Home / Self Care | Payer: PPO | Source: Skilled Nursing Facility | Attending: Adult Health | Admitting: Adult Health

## 2018-08-02 DIAGNOSIS — I5032 Chronic diastolic (congestive) heart failure: Secondary | ICD-10-CM | POA: Insufficient documentation

## 2018-08-02 DIAGNOSIS — I130 Hypertensive heart and chronic kidney disease with heart failure and stage 1 through stage 4 chronic kidney disease, or unspecified chronic kidney disease: Secondary | ICD-10-CM | POA: Diagnosis not present

## 2018-08-02 DIAGNOSIS — E1122 Type 2 diabetes mellitus with diabetic chronic kidney disease: Secondary | ICD-10-CM

## 2018-08-02 DIAGNOSIS — R41841 Cognitive communication deficit: Secondary | ICD-10-CM | POA: Diagnosis not present

## 2018-08-02 DIAGNOSIS — Z741 Need for assistance with personal care: Secondary | ICD-10-CM | POA: Insufficient documentation

## 2018-08-02 DIAGNOSIS — N390 Urinary tract infection, site not specified: Secondary | ICD-10-CM

## 2018-08-02 DIAGNOSIS — R0602 Shortness of breath: Secondary | ICD-10-CM | POA: Insufficient documentation

## 2018-08-02 DIAGNOSIS — B9620 Unspecified Escherichia coli [E. coli] as the cause of diseases classified elsewhere: Secondary | ICD-10-CM | POA: Diagnosis not present

## 2018-08-02 DIAGNOSIS — R279 Unspecified lack of coordination: Secondary | ICD-10-CM | POA: Insufficient documentation

## 2018-08-02 DIAGNOSIS — N183 Chronic kidney disease, stage 3 (moderate): Secondary | ICD-10-CM

## 2018-08-02 DIAGNOSIS — I5033 Acute on chronic diastolic (congestive) heart failure: Secondary | ICD-10-CM

## 2018-08-02 LAB — BASIC METABOLIC PANEL
Anion gap: 10 (ref 5–15)
BUN: 72 mg/dL — ABNORMAL HIGH (ref 8–23)
CO2: 28 mmol/L (ref 22–32)
Calcium: 8.9 mg/dL (ref 8.9–10.3)
Chloride: 98 mmol/L (ref 98–111)
Creatinine, Ser: 2.26 mg/dL — ABNORMAL HIGH (ref 0.61–1.24)
GFR calc Af Amer: 30 mL/min — ABNORMAL LOW (ref >60)
GFR calc non Af Amer: 26 mL/min — ABNORMAL LOW (ref >60)
Glucose, Bld: 142 mg/dL — ABNORMAL HIGH (ref 70–99)
Potassium: 4.1 mmol/L (ref 3.5–5.1)
Sodium: 136 mmol/L (ref 135–145)

## 2018-08-02 NOTE — Progress Notes (Signed)
Location:   [place] Room Number: [place] of Service:  SNF (31)   CODE STATUS: DNR  Allergies  Allergen Reactions  . Zocor [Simvastatin] Other (See Comments)    fatigue  . Lipitor [Atorvastatin] Other (See Comments)    Muscle cramping    Chief Complaint  Patient presents with  . Acute Visit    Lab Follow up    HPI:  His renal function is slightly worse with his bun elevated to 72. He is presently being treated for a uti with cipro and is on alternating dose of 20 mg and 40 mg daily. There are no reports of fevers present. He denies any uncontrolled pain;no difficulty voiding. No shortness of breath.   Past Medical History:  Diagnosis Date  . Allergic rhinitis   . Anxiety   . Arthritis   . COPD (chronic obstructive pulmonary disease) ([place])   . Coronary atherosclerosis of native coronary artery    a. CABG x 4 in 1994. Previous stent placement to SVG to distal RCA;  b. DES to native Cx in 2006;  c. DES SVG to RCA 05/08/11; d. Inf MI/Cath/PCI: LM 25, LAD 100, LCX patent stent, 50 into OM, RI 60-70, RCA 100, LIMA->LAD nl, VG->Diag 100 old, VG->RCA/PL 99 (DES x 1), EF 50-55%.  . Depression    With anxious features/hx panic attacks  . Essential hypertension, benign   . GERD (gastroesophageal reflux disease)   . Hyperlipidemia    Not on statin 2/2 hx of intolerance.   . Morbid obesity ([place])   . Nephrolithiasis   . Pneumonia   . Sleep apnea   . ST elevation myocardial infarction (STEMI) of inferior wall ([place])    4/14 - DES SVG to PDA  . Type 2 diabetes mellitus ([place])     Past Surgical History:  Procedure Laterality Date  . CORONARY ARTERY BYPASS GRAFT  1994  . EYE SURGERY    . LEFT HEART CATHETERIZATION WITH CORONARY ANGIOGRAM N/A 05/08/2011   Procedure: LEFT HEART CATHETERIZATION WITH CORONARY ANGIOGRAM;  Surgeon: [name], MD;  Location: [facility] CATH LAB;  Service: Cardiovascular;  Laterality: N/A;  . LEFT HEART CATHETERIZATION WITH  CORONARY ANGIOGRAM N/A 08/08/2012   Procedure: LEFT HEART CATHETERIZATION WITH CORONARY ANGIOGRAM;  Surgeon: [name], MD;  Location: [facility] CATH LAB;  Service: Cardiovascular;  Laterality: N/A;  . PERCUTANEOUS CORONARY STENT INTERVENTION (PCI-S) Right 05/08/2011   Procedure: PERCUTANEOUS CORONARY STENT INTERVENTION (PCI-S);  Surgeon: [name], MD;  Location: [facility] CATH LAB;  Service: Cardiovascular;  Laterality: Right;  . PERCUTANEOUS CORONARY STENT INTERVENTION (PCI-S) N/A 08/08/2012   Procedure: PERCUTANEOUS CORONARY STENT INTERVENTION (PCI-S);  Surgeon: [name], MD;  Location: [facility] CATH LAB;  Service: Cardiovascular;  Laterality: N/A;    Social History   Socioeconomic History  . Marital status: Married    Spouse name: Not on file  . Number of children: Not on file  . Years of education: Not on file  . Highest education level: Not on file  Occupational History  . Not on file  Social Needs  . Financial resource strain: Not on file  . Food insecurity:    Worry: Not on file    Inability: Not on file  . Transportation needs:    Medical: Not on file    Non-medical: Not on file  Tobacco Use  . Smoking status: Never Smoker  . Smokeless tobacco: Never Used  Substance and Sexual Activity  .  Alcohol use: No    Alcohol/week: 0.0 standard drinks  . Drug use: No  . Sexual activity: Never  Lifestyle  . Physical activity:    Days per week: Not on file    Minutes per session: Not on file  . Stress: Not on file  Relationships  . Social connections:    Talks on phone: Not on file    Gets together: Not on file    Attends religious service: Not on file    Active member of club or organization: Not on file    Attends meetings of clubs or organizations: Not on file    Relationship status: Not on file  . Intimate partner violence:    Fear of current or ex partner: Not on file    Emotionally abused: Not on file    Physically abused: Not on file    Forced sexual  activity: Not on file  Other Topics Concern  . Not on file  Social History Narrative  . Not on file   Family History  Problem Relation Age of Onset  . Heart attack Mother        Age 56      VITAL SIGNS BP (!) 127/55   Pulse 74   Temp 98.3 F (36.8 C)   Resp 16   Ht 5\' 5"  (1.651 m)   Wt 265 lb 11.2 oz (120.5 kg)   SpO2 96%   BMI 44.21 kg/m   Outpatient Encounter Medications as of 08/02/2018  Medication Sig  . allopurinol (ZYLOPRIM) 100 MG tablet Take 2 tablets (200 mg total) by mouth daily.  Marland Kitchen ALPRAZolam (XANAX) 0.5 MG tablet Take 1 tablet (0.5 mg total) by mouth 2 (two) times daily as needed for up to 13 days for sleep or anxiety.  Marland Kitchen aspirin EC 81 MG tablet Take 81 mg by mouth every morning.  . ciprofloxacin (CIPRO) 500 MG tablet Take 500 mg by mouth 2 (two) times daily. X 7 days  . clopidogrel (PLAVIX) 75 MG tablet take 1 tablet by mouth once daily WITH BREAKFAST  . hydrALAZINE (APRESOLINE) 10 MG tablet Take 1 tablet (10 mg total) by mouth every 8 (eight) hours.  . insulin aspart (NOVOLOG) 100 UNIT/ML injection Inject 10 Units into the skin 3 (three) times daily with meals. Give only if eats 50% or more of meal.  . isosorbide mononitrate (IMDUR) 30 MG 24 hr tablet Take 1 tablet (30 mg total) by mouth 2 (two) times daily.  . metoprolol tartrate (LOPRESSOR) 50 MG tablet Take 1 tablet (50 mg total) by mouth 2 (two) times daily.  . nitroGLYCERIN (NITROLINGUAL) 0.4 MG/SPRAY spray use 1-2 sprays under the tongue if needed for chest pain *MAXIMUM OF 3 SPRAYS IN 15 MINUTES  . NON FORMULARY Diet Type: NAS, Consistent Carbohydrate  . ondansetron (ZOFRAN) 4 MG tablet Take 4 mg by mouth every 8 (eight) hours as needed for nausea or vomiting.  . OXYGEN Inhale 2 L/min into the lungs continuous.  . polyethylene glycol powder (GLYCOLAX/MIRALAX) powder Take 17 g by mouth daily as needed (constipation).   . Probiotic Product (RISA-BID PROBIOTIC) TABS Take 1 tablet by mouth 2 (two) times  daily.  . psyllium (METAMUCIL) 58.6 % powder Take by mouth every morning.  . tamsulosin (FLOMAX) 0.4 MG CAPS capsule Take 0.4 mg by mouth every evening.   . torsemide (DEMADEX) 20 MG tablet Give 2 tablets (40 mg) by mouth on Even days and 1 tablet (20 mg) once a day on Odd  days  . TOUJEO SOLOSTAR 300 UNIT/ML SOPN Inject 65 Units into the skin 2 (two) times daily.  Marland Kitchen UNABLE TO FIND CPAP from home while sleeping, at previous home settings   No facility-administered encounter medications on file as of 08/02/2018.      SIGNIFICANT DIAGNOSTIC EXAMS   PREVIOUS:  07-21-18: 2-d echo:  1. Images are limited.  2. The left ventricle had a visually estimated ejection fraction of approximately 40-45%. Unable to assess focal wall motion. The cavity size was normal. There is moderately increased left ventricular wall thickness. Left ventricular diastolic Doppler  parameters are consistent with impaired relaxation.  3. The cavity was mildly enlarged.  4. The aortic valve is tricuspid. Moderate calcification of the aortic valve. Mild to moderate aortic annular calcification noted. Possible mild aortic stenosis.  5. The mitral valve is grossly normal. There is mild to moderate mitral annular calcification present.  6. The tricuspid valve was grossly normal.  7. The aortic root is normal in size and structure.  07-21-18: chest x-ray: Low lung volumes, with bibasilar opacities, potentially combination of atelectasis, scarring, and/or consolidation. Surgical changes of median sternotomy.  NO NEW EXAMS.    LABS REVIEWED PREVIOUS:   07-21-18: wbc 8.8; hgb 13.0; hct 40.5; mcv 91.6; plt 155; glucose 341l bun 59; creat 2.31; k+ 4.1; na++ 134; ca 9.4 BNP 126.0 hgb a1c 11.5  07-23-18: glucose 208; bun 55; creat 2.16; k+ 3.8; na++ 134; ca 8.7; phos 2.2; albumin 3.8  07-29-18: wbc 10.7; hgb 15.0; hct 46.7; mcv 91.0 plt 282  urine culture: e-coli: cipro   TODAY;   08-02-18: glucose 142; bun 72; creat 2.26 k+ 3.8;  na++ 136 8.9     Review of Systems  Constitutional: Negative for malaise/fatigue.  Respiratory: Negative for cough and shortness of breath.   Cardiovascular: Negative for chest pain, palpitations and leg swelling.  Gastrointestinal: Negative for abdominal pain, constipation and heartburn.  Musculoskeletal: Negative for back pain, joint pain and myalgias.  Skin: Negative.   Neurological: Negative for dizziness.  Psychiatric/Behavioral: The patient is not nervous/anxious.     Physical Exam Constitutional:      General: He is not in acute distress.    Appearance: He is morbidly obese. He is not diaphoretic.  Neck:     Thyroid: No thyromegaly.  Cardiovascular:     Rate and Rhythm: Normal rate and regular rhythm.     Pulses: Normal pulses.     Heart sounds: Normal heart sounds.     Comments: History of CABG Pulmonary:     Effort: Pulmonary effort is normal. No respiratory distress.     Breath sounds: Normal breath sounds.     Comments: 02 dependent  Abdominal:     General: Bowel sounds are normal. There is no distension.     Palpations: Abdomen is soft.     Tenderness: There is no abdominal tenderness.  Musculoskeletal:     Right lower leg: Edema present.     Left lower leg: Edema present.     Comments: Trace bilateral lower extremity edema Is able to move all extremities     Lymphadenopathy:     Cervical: No cervical adenopathy.  Skin:    General: Skin is warm and dry.  Neurological:     Mental Status: He is alert and oriented to person, place, and time.  Psychiatric:        Mood and Affect: Mood normal.      ASSESSMENT/ PLAN:  TODAY:   1.  Acute on chronic diastolic heart failure 2. CKD stage 3 due to type 2 diabetes mellitus 3. E-eoli: UTI  Will lower cipro to one time daily due to his renal function Will hold demadex and restart on 08-05-18 at 20 mg dai y Will then repeat bmp.      MD is aware of resident's narcotic use and is in agreement with current  plan of care. We will attempt to wean resident as apropriate   Ok Edwards NP [facility]  Contact [phone] Monday through Friday 8am- 5pm  After hours call [phone]

## 2018-08-03 ENCOUNTER — Encounter (HOSPITAL_COMMUNITY)
Admission: RE | Admit: 2018-08-03 | Discharge: 2018-08-03 | Disposition: A | Discharge status: Home / Self Care | Payer: PPO | Source: Skilled Nursing Facility | Attending: Internal Medicine | Admitting: Internal Medicine

## 2018-08-03 DIAGNOSIS — J449 Chronic obstructive pulmonary disease, unspecified: Secondary | ICD-10-CM | POA: Diagnosis not present

## 2018-08-03 DIAGNOSIS — J309 Allergic rhinitis, unspecified: Secondary | ICD-10-CM | POA: Insufficient documentation

## 2018-08-03 DIAGNOSIS — I5032 Chronic diastolic (congestive) heart failure: Secondary | ICD-10-CM | POA: Diagnosis not present

## 2018-08-03 LAB — BASIC METABOLIC PANEL
Anion gap: 9 (ref 5–15)
BUN: 66 mg/dL — ABNORMAL HIGH (ref 8–23)
CO2: 29 mmol/L (ref 22–32)
Calcium: 8.7 mg/dL — ABNORMAL LOW (ref 8.9–10.3)
Chloride: 99 mmol/L (ref 98–111)
Creatinine, Ser: 1.98 mg/dL — ABNORMAL HIGH (ref 0.61–1.24)
GFR calc Af Amer: 35 mL/min — ABNORMAL LOW (ref >60)
GFR calc non Af Amer: 30 mL/min — ABNORMAL LOW (ref >60)
Glucose, Bld: 75 mg/dL (ref 70–99)
Potassium: 3.9 mmol/L (ref 3.5–5.1)
Sodium: 137 mmol/L (ref 135–145)

## 2018-08-04 ENCOUNTER — Non-Acute Institutional Stay (SKILLED_NURSING_FACILITY): Payer: PPO | Admitting: Adult Health

## 2018-08-04 ENCOUNTER — Encounter: Payer: Self-pay | Admitting: Adult Health

## 2018-08-04 DIAGNOSIS — J449 Chronic obstructive pulmonary disease, unspecified: Secondary | ICD-10-CM | POA: Diagnosis not present

## 2018-08-04 DIAGNOSIS — N183 Chronic kidney disease, stage 3 (moderate): Secondary | ICD-10-CM | POA: Diagnosis not present

## 2018-08-04 DIAGNOSIS — K5909 Other constipation: Secondary | ICD-10-CM

## 2018-08-04 DIAGNOSIS — B9620 Unspecified Escherichia coli [E. coli] as the cause of diseases classified elsewhere: Secondary | ICD-10-CM | POA: Insufficient documentation

## 2018-08-04 DIAGNOSIS — N390 Urinary tract infection, site not specified: Secondary | ICD-10-CM | POA: Insufficient documentation

## 2018-08-04 DIAGNOSIS — E1122 Type 2 diabetes mellitus with diabetic chronic kidney disease: Secondary | ICD-10-CM | POA: Diagnosis not present

## 2018-08-04 NOTE — Progress Notes (Signed)
Location:   [place] Room Number: [place] of Service:  SNF (31)   CODE STATUS: DNR  Allergies  Allergen Reactions  . Zocor [Simvastatin] Other (See Comments)    fatigue  . Lipitor [Atorvastatin] Other (See Comments)    Muscle cramping    Chief Complaint  Patient presents with  . Medical Management of Chronic Issues    Chronic obstructive pulmonary disease unspecified COPD type; chronic constipation; CKD stage 3 due to type 2 diabetes mellitus. weekly follow up for the first 30 days post hospitalization.     HPI:  [name] is a 85 year old short term rehab patient being seen for the management of her chronic illnesses; copd; constipation; ckd stage 3. [name] is presently being treated for an UTI. [name] denies any uncontrolled pain; no changes in appetite; no issues with constipation. There are reports of fevers present.   Past Medical History:  Diagnosis Date  . Allergic rhinitis   . Anxiety   . Arthritis   . COPD (chronic obstructive pulmonary disease) ([place])   . Coronary atherosclerosis of native coronary artery    a. CABG x 4 in 1994. Previous stent placement to SVG to distal RCA;  b. DES to native Cx in 2006;  c. DES SVG to RCA 05/08/11; d. Inf MI/Cath/PCI: LM 25, LAD 100, LCX patent stent, 50 into OM, RI 60-70, RCA 100, LIMA->LAD nl, VG->Diag 100 old, VG->RCA/PL 99 (DES x 1), EF 50-55%.  . Depression    With anxious features/hx panic attacks  . Essential hypertension, benign   . GERD (gastroesophageal reflux disease)   . Hyperlipidemia    Not on statin 2/2 hx of intolerance.   . Morbid obesity ([place])   . Nephrolithiasis   . Pneumonia   . Sleep apnea   . ST elevation myocardial infarction (STEMI) of inferior wall ([place])    4/14 - DES SVG to PDA  . Type 2 diabetes mellitus ([place])     Past Surgical History:  Procedure Laterality Date  . CORONARY ARTERY BYPASS GRAFT  1994  . EYE SURGERY    . LEFT HEART CATHETERIZATION WITH CORONARY ANGIOGRAM N/A  05/08/2011   Procedure: LEFT HEART CATHETERIZATION WITH CORONARY ANGIOGRAM;  Surgeon: [name], MD;  Location:  [facility] CATH LAB;  Service: Cardiovascular;  Laterality: N/A;  . LEFT HEART CATHETERIZATION WITH CORONARY ANGIOGRAM N/A 08/08/2012   Procedure: LEFT HEART CATHETERIZATION WITH CORONARY ANGIOGRAM;  Surgeon: [name], MD;  Location: [facility] CATH LAB;  Service: Cardiovascular;  Laterality: N/A;  . PERCUTANEOUS CORONARY STENT INTERVENTION (PCI-S) Right 05/08/2011   Procedure: PERCUTANEOUS CORONARY STENT INTERVENTION (PCI-S);  Surgeon: [name], MD;  Location: [facility] CATH LAB;  Service: Cardiovascular;  Laterality: Right;  . PERCUTANEOUS CORONARY STENT INTERVENTION (PCI-S) N/A 08/08/2012   Procedure: PERCUTANEOUS CORONARY STENT INTERVENTION (PCI-S);  Surgeon: [name], MD;  Location: [facility] CATH LAB;  Service: Cardiovascular;  Laterality: N/A;    Social History   Socioeconomic History  . Marital status: Married    Spouse name: Not on file  . Number of children: Not on file  . Years of education: Not on file  . Highest education level: Not on file  Occupational History  . Not on file  Social Needs  . Financial resource strain: Not on file  . Food insecurity:    Worry: Not on file    Inability: Not on file  . Transportation needs:    Medical: Not on file  Non-medical: Not on file  Tobacco Use  . Smoking status: Never Smoker  . Smokeless tobacco: Never Used  Substance and Sexual Activity  . Alcohol use: No    Alcohol/week: 0.0 standard drinks  . Drug use: No  . Sexual activity: Never  Lifestyle  . Physical activity:    Days per week: Not on file    Minutes per session: Not on file  . Stress: Not on file  Relationships  . Social connections:    Talks on phone: Not on file    Gets together: Not on file    Attends religious service: Not on file    Active member of club or organization: Not on file    Attends meetings of clubs or organizations: Not  on file    Relationship status: Not on file  . Intimate partner violence:    Fear of current or ex partner: Not on file    Emotionally abused: Not on file    Physically abused: Not on file    Forced sexual activity: Not on file  Other Topics Concern  . Not on file  Social History Narrative  . Not on file   Family History  Problem Relation Age of Onset  . Heart attack Mother        Age 65      VITAL SIGNS BP (!) 159/91   Pulse 89   Temp 97.8 F (36.6 C)   Resp 20   Ht 5\' 5"  (1.651 m)   Wt 265 lb 11.2 oz (120.5 kg)   SpO2 96%   BMI 44.21 kg/m   Outpatient Encounter Medications as of 08/04/2018  Medication Sig  . allopurinol (ZYLOPRIM) 100 MG tablet Take 2 tablets (200 mg total) by mouth daily.  Marland Kitchen ALPRAZolam (XANAX) 0.5 MG tablet Take 1 tablet (0.5 mg total) by mouth 2 (two) times daily as needed for up to 13 days for sleep or anxiety.  Marland Kitchen aspirin EC 81 MG tablet Take 81 mg by mouth every morning.  . ciprofloxacin (CIPRO) 500 MG tablet Take 500 mg by mouth 2 (two) times daily. X 7 days  . clopidogrel (PLAVIX) 75 MG tablet take 1 tablet by mouth once daily WITH BREAKFAST  . hydrALAZINE (APRESOLINE) 10 MG tablet Take 1 tablet (10 mg total) by mouth every 8 (eight) hours.  . insulin aspart (NOVOLOG) 100 UNIT/ML injection Inject 10 Units into the skin 3 (three) times daily with meals. Give only if eats 50% or more of meal.  . isosorbide mononitrate (IMDUR) 30 MG 24 hr tablet Take 1 tablet (30 mg total) by mouth 2 (two) times daily.  . metoprolol tartrate (LOPRESSOR) 50 MG tablet Take 1 tablet (50 mg total) by mouth 2 (two) times daily.  . nitroGLYCERIN (NITROLINGUAL) 0.4 MG/SPRAY spray Place 1 spray under the tongue every 5 (five) minutes x 3 doses as needed for chest pain.  . NON FORMULARY Diet Type: NAS, Consistent Carbohydrate  . ondansetron (ZOFRAN) 4 MG tablet Take 4 mg by mouth every 8 (eight) hours as needed for nausea or vomiting.  . OXYGEN Inhale 2 L/min into the lungs  continuous.  . polyethylene glycol powder (GLYCOLAX/MIRALAX) powder Take 17 g by mouth daily as needed (constipation).   . Probiotic Product (RISA-BID PROBIOTIC) TABS Take 1 tablet by mouth 2 (two) times daily.  . psyllium (METAMUCIL) 58.6 % powder Take by mouth every morning.  . tamsulosin (FLOMAX) 0.4 MG CAPS capsule Take 0.4 mg by mouth every evening.   . [  START ON 08/05/2018] torsemide (DEMADEX) 20 MG tablet Take 20 mg by mouth daily. Beginning 08/05/2018  . TOUJEO SOLOSTAR 300 UNIT/ML SOPN Inject 65 Units into the skin 2 (two) times daily.  Marland Kitchen UNABLE TO FIND CPAP from home while sleeping, at previous home settings  . [DISCONTINUED] nitroGLYCERIN (NITROLINGUAL) 0.4 MG/SPRAY spray use 1-2 sprays under the tongue if needed for chest pain *MAXIMUM OF 3 SPRAYS IN 15 MINUTES (Patient not taking: Reported on 08/04/2018)  . [DISCONTINUED] torsemide (DEMADEX) 20 MG tablet Give 2 tablets (40 mg) by mouth on Even days and 1 tablet (20 mg) once a day on Odd days   No facility-administered encounter medications on file as of 08/04/2018.      SIGNIFICANT DIAGNOSTIC EXAMS   PREVIOUS:  07-21-18: 2-d echo:  1. Images are limited.  2. The left ventricle had a visually estimated ejection fraction of approximately 40-45%. Unable to assess focal wall motion. The cavity size was normal. There is moderately increased left ventricular wall thickness. Left ventricular diastolic Doppler  parameters are consistent with impaired relaxation.  3. The cavity was mildly enlarged.  4. The aortic valve is tricuspid. Moderate calcification of the aortic valve. Mild to moderate aortic annular calcification noted. Possible mild aortic stenosis.  5. The mitral valve is grossly normal. There is mild to moderate mitral annular calcification present.  6. The tricuspid valve was grossly normal.  7. The aortic root is normal in size and structure.  07-21-18: chest x-ray: Low lung volumes, with bibasilar opacities, potentially  combination of atelectasis, scarring, and/or consolidation. Surgical changes of median sternotomy.  NO NEW EXAMS.    LABS REVIEWED PREVIOUS:   07-21-18: wbc 8.8; hgb 13.0; hct 40.5; mcv 91.6; plt 155; glucose 341l bun 59; creat 2.31; k+ 4.1; na++ 134; ca 9.4 BNP 126.0 hgb a1c 11.5  07-23-18: glucose 208; bun 55; creat 2.16; k+ 3.8; na++ 134; ca 8.7; phos 2.2; albumin 3.8  07-29-18: wbc 10.7; hgb 15.0; hct 46.7; mcv 91.0 plt 282  urine culture: e-coli: cipro  08-02-18: glucose 142; bun 72; creat 2.26 k+ 3.8; na++ 136 8.9    TODAY:   08-03-18: glucose 75 bun 66; creat 1.98;  k+ 3.9; na++ 137 ca 8.7    Review of Systems  Constitutional: Negative for malaise/fatigue.  Respiratory: Negative for cough and shortness of breath.   Cardiovascular: Negative for chest pain, palpitations and leg swelling.  Gastrointestinal: Negative for abdominal pain, constipation and heartburn.  Musculoskeletal: Negative for back pain, joint pain and myalgias.  Skin: Negative.   Neurological: Negative for dizziness.  Psychiatric/Behavioral: The patient is not nervous/anxious.     Physical Exam Constitutional:      General: [name] is not in acute distress.    Appearance: [name] is morbidly obese. [name] is not diaphoretic.  Neck:     Musculoskeletal: Neck supple.     Thyroid: No thyromegaly.  Cardiovascular:     Rate and Rhythm: Normal rate and regular rhythm.     Pulses: Normal pulses.     Heart sounds: Normal heart sounds.     Comments: History of CABG  Pulmonary:     Effort: Pulmonary effort is normal. No respiratory distress.     Breath sounds: Normal breath sounds.     Comments: 02 dependent  Abdominal:     General: Bowel sounds are normal. There is no distension.     Palpations: Abdomen is soft.     Tenderness: There is no abdominal tenderness.  Musculoskeletal:  Right lower leg: Edema present.     Left lower leg: Edema present.     Comments: Trace bilateral lower extremity edema Is able to move all  extremities   Lymphadenopathy:     Cervical: No cervical adenopathy.  Skin:    General: Skin is warm and dry.  Neurological:     General: No focal deficit present.     Mental Status: [name] is alert and oriented to person, place, and time.     Cranial Nerves: No cranial nerve deficit.  Psychiatric:        Mood and Affect: Mood normal.      ASSESSMENT/ PLAN:  TODAY:   1. CKD stage 3 due to type 2 diabetes mellitus: is stable bun 66; creat 1.98 will monitor   2. Chronic constipation: is stable will continue metamucil daily and miralax daily as needed  3. COPD is stable is 02 dependent will monitor   PREVIOUS  4. Coronary artery disease involving native coronary artery of native heart with unstable angina: is stable at this time will continue asa 81 mg daily plavix 75 mg daily; imdur 30 mg twice daily and has prn ntg.   5. Acute on chronic diastolic heart failure/Secondary cardiomyopathy: is stable EF 40-45% (07-21-18): will continue demadex 20 mg daily (starts 08-05-18)lopressor 50 mg twice daily and imdur 30 mg twice daily apresoline 10 mg every 8 hours  6. Essential hypertension, benign: is stable b/p 159/91 will continue apresoline 10 mg every 8 hours; lopressor 50 mg twice daily   7. Type 2 diabetes mellitus with stage 3 chronic kidney disease with long term current use of insulin: is stable hgb a1c 11.5 will continue toujeo 65 units twice daily and novolog 10 units with meals.   8. Obesity class III BMI 40-49.9 (morbid obesity) without change BMI 44.35 will monitor   9. BPH without urinary obstruction: is stable will continue flomax 0.4 mg daily   10. Chronic gout due to renal impairment: is without recent flares will continue allopurinol 200 mg daily   11. Depression with anxiety: does have history of panic attacks; will continue xanax 0.5 mg twice daily for total of 14 days.   12. E-coli UTI: will complete cipro and will monitor his status.   13. Obstructive sleep apnea: is  stable will continue CPAP nightly    MD is aware of resident's narcotic use and is in agreement with current plan of care. We will attempt to wean resident as apropriate   Ok Edwards NP [facility]  Contact [phone] Monday through Friday 8am- 5pm  After hours call [phone]

## 2018-08-08 ENCOUNTER — Other Ambulatory Visit: Payer: Self-pay | Admitting: Adult Health

## 2018-08-08 ENCOUNTER — Non-Acute Institutional Stay (SKILLED_NURSING_FACILITY): Payer: PPO | Admitting: Adult Health

## 2018-08-08 ENCOUNTER — Encounter: Payer: Self-pay | Admitting: Adult Health

## 2018-08-08 DIAGNOSIS — F418 Other specified anxiety disorders: Secondary | ICD-10-CM | POA: Diagnosis not present

## 2018-08-08 MED ORDER — ALPRAZOLAM 0.5 MG PO TABS: 0.5000 mg | ORAL_TABLET | Freq: Every day | ORAL | 0 refills | Status: DC

## 2018-08-08 NOTE — Progress Notes (Signed)
Location:   [place] Room Number: [place] of Service:  SNF (31)   CODE STATUS: DNR  Allergies  Allergen Reactions  . Zocor [Simvastatin] Other (See Comments)    fatigue  . Lipitor [Atorvastatin] Other (See Comments)    Muscle cramping    Chief Complaint  Patient presents with  . Acute Visit    Anxiety    HPI:  He currently as a xanax twice daily as needed for anxiety order. He has been utilizing this medication fairly often at night to help his worsening anxiety at night. He denies any problems with anxiety during the day time. He denies any changes in his appetite. There are no reports of fevers present.   Past Medical History:  Diagnosis Date  . Allergic rhinitis   . Anxiety   . Arthritis   . COPD (chronic obstructive pulmonary disease) ([place])   . Coronary atherosclerosis of native coronary artery    a. CABG x 4 in 1994. Previous stent placement to SVG to distal RCA;  b. DES to native Cx in 2006;  c. DES SVG to RCA 05/08/11; d. Inf MI/Cath/PCI: LM 25, LAD 100, LCX patent stent, 50 into OM, RI 60-70, RCA 100, LIMA->LAD nl, VG->Diag 100 old, VG->RCA/PL 99 (DES x 1), EF 50-55%.  . Depression    With anxious features/hx panic attacks  . Essential hypertension, benign   . GERD (gastroesophageal reflux disease)   . Hyperlipidemia    Not on statin 2/2 hx of intolerance.   . Morbid obesity ([place])   . Nephrolithiasis   . Pneumonia   . Sleep apnea   . ST elevation myocardial infarction (STEMI) of inferior wall ([place])    4/14 - DES SVG to PDA  . Type 2 diabetes mellitus ([place])     Past Surgical History:  Procedure Laterality Date  . CORONARY ARTERY BYPASS GRAFT  1994  . EYE SURGERY    . LEFT HEART CATHETERIZATION WITH CORONARY ANGIOGRAM N/A 05/08/2011   Procedure: LEFT HEART CATHETERIZATION WITH CORONARY ANGIOGRAM;  Surgeon: [name], MD;  Location: [facility] CATH LAB;  Service: Cardiovascular;  Laterality: N/A;  . LEFT HEART CATHETERIZATION  WITH CORONARY ANGIOGRAM N/A 08/08/2012   Procedure: LEFT HEART CATHETERIZATION WITH CORONARY ANGIOGRAM;  Surgeon: [name], MD;  Location: [facility] CATH LAB;  Service: Cardiovascular;  Laterality: N/A;  . PERCUTANEOUS CORONARY STENT INTERVENTION (PCI-S) Right 05/08/2011   Procedure: PERCUTANEOUS CORONARY STENT INTERVENTION (PCI-S);  Surgeon: [name], MD;  Location: [facility] CATH LAB;  Service: Cardiovascular;  Laterality: Right;  . PERCUTANEOUS CORONARY STENT INTERVENTION (PCI-S) N/A 08/08/2012   Procedure: PERCUTANEOUS CORONARY STENT INTERVENTION (PCI-S);  Surgeon: [name], MD;  Location: [facility] CATH LAB;  Service: Cardiovascular;  Laterality: N/A;    Social History   Socioeconomic History  . Marital status: Married    Spouse name: Not on file  . Number of children: Not on file  . Years of education: Not on file  . Highest education level: Not on file  Occupational History  . Not on file  Social Needs  . Financial resource strain: Not on file  . Food insecurity:    Worry: Not on file    Inability: Not on file  . Transportation needs:    Medical: Not on file    Non-medical: Not on file  Tobacco Use  . Smoking status: Never Smoker  . Smokeless tobacco: Never Used  Substance and Sexual Activity  . Alcohol  use: No    Alcohol/week: 0.0 standard drinks  . Drug use: No  . Sexual activity: Never  Lifestyle  . Physical activity:    Days per week: Not on file    Minutes per session: Not on file  . Stress: Not on file  Relationships  . Social connections:    Talks on phone: Not on file    Gets together: Not on file    Attends religious service: Not on file    Active member of club or organization: Not on file    Attends meetings of clubs or organizations: Not on file    Relationship status: Not on file  . Intimate partner violence:    Fear of current or ex partner: Not on file    Emotionally abused: Not on file    Physically abused: Not on file    Forced  sexual activity: Not on file  Other Topics Concern  . Not on file  Social History Narrative  . Not on file   Family History  Problem Relation Age of Onset  . Heart attack Mother        Age 69      VITAL SIGNS BP 126/78   Pulse 70   Temp 97.8 F (36.6 C)   Resp 18   Ht 5\' 5"  (1.651 m)   Wt 264 lb 14.4 oz (120.2 kg)   SpO2 96%   BMI 44.08 kg/m   Outpatient Encounter Medications as of 08/08/2018  Medication Sig  . allopurinol (ZYLOPRIM) 100 MG tablet Take 2 tablets (200 mg total) by mouth daily.  Marland Kitchen ALPRAZolam (XANAX) 0.5 MG tablet Take 1 tablet (0.5 mg total) by mouth 2 (two) times daily as needed for up to 13 days for sleep or anxiety.  Marland Kitchen aspirin EC 81 MG tablet Take 81 mg by mouth every morning.  . ciprofloxacin (CIPRO) 500 MG tablet Take 500 mg by mouth 2 (two) times daily. X 7 days  . clopidogrel (PLAVIX) 75 MG tablet take 1 tablet by mouth once daily WITH BREAKFAST  . hydrALAZINE (APRESOLINE) 10 MG tablet Take 1 tablet (10 mg total) by mouth every 8 (eight) hours.  . insulin aspart (NOVOLOG) 100 UNIT/ML injection Inject 10 Units into the skin 3 (three) times daily with meals. Give only if eats 50% or more of meal.  . isosorbide mononitrate (IMDUR) 30 MG 24 hr tablet Take 1 tablet (30 mg total) by mouth 2 (two) times daily.  . metoprolol tartrate (LOPRESSOR) 50 MG tablet Take 1 tablet (50 mg total) by mouth 2 (two) times daily.  . nitroGLYCERIN (NITROLINGUAL) 0.4 MG/SPRAY spray Place 1 spray under the tongue every 5 (five) minutes x 3 doses as needed for chest pain.  . NON FORMULARY Diet Type: NAS, Consistent Carbohydrate  . ondansetron (ZOFRAN) 4 MG tablet Take 4 mg by mouth every 8 (eight) hours as needed for nausea or vomiting.  . OXYGEN Inhale 2 L/min into the lungs continuous.  . polyethylene glycol powder (GLYCOLAX/MIRALAX) powder Take 17 g by mouth daily as needed (constipation).   . Probiotic Product (RISA-BID PROBIOTIC) TABS Take 1 tablet by mouth 2 (two) times  daily.  . psyllium (METAMUCIL) 58.6 % powder Take by mouth every morning.  . tamsulosin (FLOMAX) 0.4 MG CAPS capsule Take 0.4 mg by mouth every evening.   . torsemide (DEMADEX) 20 MG tablet Take 20 mg by mouth daily. Beginning 08/05/2018  . TOUJEO SOLOSTAR 300 UNIT/ML SOPN Inject 65 Units into the skin 2 (  two) times daily.  Marland Kitchen UNABLE TO FIND CPAP from home while sleeping, at previous home settings   No facility-administered encounter medications on file as of 08/08/2018.      SIGNIFICANT DIAGNOSTIC EXAMS  PREVIOUS:  07-21-18: 2-d echo:  1. Images are limited.  2. The left ventricle had a visually estimated ejection fraction of approximately 40-45%. Unable to assess focal wall motion. The cavity size was normal. There is moderately increased left ventricular wall thickness. Left ventricular diastolic Doppler  parameters are consistent with impaired relaxation.  3. The cavity was mildly enlarged.  4. The aortic valve is tricuspid. Moderate calcification of the aortic valve. Mild to moderate aortic annular calcification noted. Possible mild aortic stenosis.  5. The mitral valve is grossly normal. There is mild to moderate mitral annular calcification present.  6. The tricuspid valve was grossly normal.  7. The aortic root is normal in size and structure.  07-21-18: chest x-ray: Low lung volumes, with bibasilar opacities, potentially combination of atelectasis, scarring, and/or consolidation. Surgical changes of median sternotomy.  NO NEW EXAMS.    LABS REVIEWED PREVIOUS:   07-21-18: wbc 8.8; hgb 13.0; hct 40.5; mcv 91.6; plt 155; glucose 341l bun 59; creat 2.31; k+ 4.1; na++ 134; ca 9.4 BNP 126.0 hgb a1c 11.5  07-23-18: glucose 208; bun 55; creat 2.16; k+ 3.8; na++ 134; ca 8.7; phos 2.2; albumin 3.8  07-29-18: wbc 10.7; hgb 15.0; hct 46.7; mcv 91.0 plt 282  urine culture: e-coli: cipro  08-02-18: glucose 142; bun 72; creat 2.26 k+ 3.8; na++ 136 8.9  08-03-18: glucose 75 bun 66; creat 1.98;  k+  3.9; na++ 137 ca 8.7  NO NEW LABS.     Review of Systems  Constitutional: Negative for malaise/fatigue.  Respiratory: Negative for cough and shortness of breath.   Cardiovascular: Negative for chest pain, palpitations and leg swelling.  Gastrointestinal: Negative for abdominal pain, constipation and heartburn.  Musculoskeletal: Negative for back pain, joint pain and myalgias.  Skin: Negative.   Neurological: Negative for dizziness.  Psychiatric/Behavioral: The patient is nervous/anxious.    Physical Exam Constitutional:      General: He is not in acute distress.    Appearance: He is morbidly obese. He is not diaphoretic.  Neck:     Musculoskeletal: Neck supple.     Thyroid: No thyromegaly.  Cardiovascular:     Rate and Rhythm: Normal rate and regular rhythm.     Pulses: Normal pulses.     Heart sounds: Normal heart sounds.     Comments: History of CABG Pulmonary:     Effort: Pulmonary effort is normal. No respiratory distress.     Breath sounds: Normal breath sounds.     Comments: 02 dependent  Abdominal:     General: Bowel sounds are normal. There is no distension.     Palpations: Abdomen is soft.     Tenderness: There is no abdominal tenderness.  Musculoskeletal:     Right lower leg: Edema present.     Left lower leg: Edema present.     Comments: Trace bilateral lower extremity edema Is able to move all extremities  Lymphadenopathy:     Cervical: No cervical adenopathy.  Skin:    General: Skin is warm and dry.  Neurological:     Mental Status: He is alert and oriented to person, place, and time.  Psychiatric:        Mood and Affect: Mood normal.      ASSESSMENT/ PLAN:  TODAY:  1. Depression with anxiety does have a history of panic attacks; will change xanax to 0.5 mg nightly and will monitor his status.     MD is aware of resident's narcotic use and is in agreement with current plan of care. We will attempt to wean resident as apropriate   Ok Edwards NP [facility]  Contact [phone] Monday through Friday 8am- 5pm  After hours call [phone]

## 2018-08-09 ENCOUNTER — Non-Acute Institutional Stay (SKILLED_NURSING_FACILITY): Payer: PPO | Admitting: Adult Health

## 2018-08-09 ENCOUNTER — Encounter: Payer: Self-pay | Admitting: Adult Health

## 2018-08-09 DIAGNOSIS — E1122 Type 2 diabetes mellitus with diabetic chronic kidney disease: Secondary | ICD-10-CM

## 2018-08-09 DIAGNOSIS — N183 Chronic kidney disease, stage 3 unspecified: Secondary | ICD-10-CM

## 2018-08-09 DIAGNOSIS — Z794 Long term (current) use of insulin: Secondary | ICD-10-CM | POA: Diagnosis not present

## 2018-08-09 NOTE — Progress Notes (Signed)
Location:   [place] Room Number: [place] of Service:  SNF (31)   CODE STATUS: DNR  Allergies  Allergen Reactions  . Zocor [Simvastatin] Other (See Comments)    fatigue  . Lipitor [Atorvastatin] Other (See Comments)    Muscle cramping    Chief Complaint  Patient presents with  . Acute Visit    DM    HPI:  He is presently taking toujeo 65 units twice daily and novolog 10 units with meals. He has had some low readings in the AM in the 70's. He did feel weak from those readings. He denies any excessive thirst or hunger. He denies any uncontrolled pain. No reports of fevers present.   Past Medical History:  Diagnosis Date  . Allergic rhinitis   . Anxiety   . Arthritis   . COPD (chronic obstructive pulmonary disease) ([place])   . Coronary atherosclerosis of native coronary artery    a. CABG x 4 in 1994. Previous stent placement to SVG to distal RCA;  b. DES to native Cx in 2006;  c. DES SVG to RCA 05/08/11; d. Inf MI/Cath/PCI: LM 25, LAD 100, LCX patent stent, 50 into OM, RI 60-70, RCA 100, LIMA->LAD nl, VG->Diag 100 old, VG->RCA/PL 99 (DES x 1), EF 50-55%.  . Depression    With anxious features/hx panic attacks  . Essential hypertension, benign   . GERD (gastroesophageal reflux disease)   . Hyperlipidemia    Not on statin 2/2 hx of intolerance.   . Morbid obesity ([place])   . Nephrolithiasis   . Pneumonia   . Sleep apnea   . ST elevation myocardial infarction (STEMI) of inferior wall ([place])    4/14 - DES SVG to PDA  . Type 2 diabetes mellitus ([place])     Past Surgical History:  Procedure Laterality Date  . CORONARY ARTERY BYPASS GRAFT  1994  . EYE SURGERY    . LEFT HEART CATHETERIZATION WITH CORONARY ANGIOGRAM N/A 05/08/2011   Procedure: LEFT HEART CATHETERIZATION WITH CORONARY ANGIOGRAM;  Surgeon: [name], MD;  Location: [facility] CATH LAB;  Service: Cardiovascular;  Laterality: N/A;  . LEFT HEART CATHETERIZATION WITH CORONARY ANGIOGRAM  N/A 08/08/2012   Procedure: LEFT HEART CATHETERIZATION WITH CORONARY ANGIOGRAM;  Surgeon: [name], MD;  Location: [facility] CATH LAB;  Service: Cardiovascular;  Laterality: N/A;  . PERCUTANEOUS CORONARY STENT INTERVENTION (PCI-S) Right 05/08/2011   Procedure: PERCUTANEOUS CORONARY STENT INTERVENTION (PCI-S);  Surgeon: [name], MD;  Location: [facility] CATH LAB;  Service: Cardiovascular;  Laterality: Right;  . PERCUTANEOUS CORONARY STENT INTERVENTION (PCI-S) N/A 08/08/2012   Procedure: PERCUTANEOUS CORONARY STENT INTERVENTION (PCI-S);  Surgeon: [name], MD;  Location: [facility] CATH LAB;  Service: Cardiovascular;  Laterality: N/A;    Social History   Socioeconomic History  . Marital status: Married    Spouse name: Not on file  . Number of children: Not on file  . Years of education: Not on file  . Highest education level: Not on file  Occupational History  . Not on file  Social Needs  . Financial resource strain: Not on file  . Food insecurity:    Worry: Not on file    Inability: Not on file  . Transportation needs:    Medical: Not on file    Non-medical: Not on file  Tobacco Use  . Smoking status: Never Smoker  . Smokeless tobacco: Never Used  Substance and Sexual Activity  . Alcohol use: No  Alcohol/week: 0.0 standard drinks  . Drug use: No  . Sexual activity: Never  Lifestyle  . Physical activity:    Days per week: Not on file    Minutes per session: Not on file  . Stress: Not on file  Relationships  . Social connections:    Talks on phone: Not on file    Gets together: Not on file    Attends religious service: Not on file    Active member of club or organization: Not on file    Attends meetings of clubs or organizations: Not on file    Relationship status: Not on file  . Intimate partner violence:    Fear of current or ex partner: Not on file    Emotionally abused: Not on file    Physically abused: Not on file    Forced sexual activity: Not on  file  Other Topics Concern  . Not on file  Social History Narrative  . Not on file   Family History  Problem Relation Age of Onset  . Heart attack Mother        Age 37      VITAL SIGNS BP (!) 159/92   Pulse 69   Temp 98 F (36.7 C)   Resp 20   Ht 5\' 5"  (1.651 m)   Wt 264 lb 14.4 oz (120.2 kg)   SpO2 98%   BMI 44.08 kg/m   Outpatient Encounter Medications as of 08/09/2018  Medication Sig  . allopurinol (ZYLOPRIM) 100 MG tablet Take 2 tablets (200 mg total) by mouth daily.  Marland Kitchen ALPRAZolam (XANAX) 0.5 MG tablet Take 1 tablet (0.5 mg total) by mouth at bedtime.  Marland Kitchen aspirin EC 81 MG tablet Take 81 mg by mouth every morning.  . clopidogrel (PLAVIX) 75 MG tablet take 1 tablet by mouth once daily WITH BREAKFAST  . hydrALAZINE (APRESOLINE) 10 MG tablet Take 1 tablet (10 mg total) by mouth every 8 (eight) hours.  . insulin aspart (NOVOLOG) 100 UNIT/ML injection Inject 10 Units into the skin 3 (three) times daily with meals. Give only if eats 50% or more of meal.  . isosorbide mononitrate (IMDUR) 30 MG 24 hr tablet Take 1 tablet (30 mg total) by mouth 2 (two) times daily.  . metoprolol tartrate (LOPRESSOR) 50 MG tablet Take 1 tablet (50 mg total) by mouth 2 (two) times daily.  . nitroGLYCERIN (NITROLINGUAL) 0.4 MG/SPRAY spray Place 1 spray under the tongue every 5 (five) minutes x 3 doses as needed for chest pain.  . NON FORMULARY Diet Type: NAS, Consistent Carbohydrate  . ondansetron (ZOFRAN) 4 MG tablet Take 4 mg by mouth every 8 (eight) hours as needed for nausea or vomiting.  . OXYGEN Inhale 2 L/min into the lungs continuous.  . polyethylene glycol powder (GLYCOLAX/MIRALAX) powder Take 17 g by mouth daily as needed (constipation).   . Probiotic Product (RISA-BID PROBIOTIC) TABS Take 1 tablet by mouth 2 (two) times daily.  . psyllium (METAMUCIL) 58.6 % powder Take by mouth every morning.  . tamsulosin (FLOMAX) 0.4 MG CAPS capsule Take 0.4 mg by mouth every evening.   . torsemide  (DEMADEX) 20 MG tablet Take 20 mg by mouth daily. Beginning 08/05/2018  . TOUJEO SOLOSTAR 300 UNIT/ML SOPN Inject 65 Units into the skin 2 (two) times daily.  Marland Kitchen UNABLE TO FIND CPAP from home while sleeping, at previous home settings  . [DISCONTINUED] ciprofloxacin (CIPRO) 500 MG tablet Take 500 mg by mouth 2 (two) times daily. X 7  days   No facility-administered encounter medications on file as of 08/09/2018.      SIGNIFICANT DIAGNOSTIC EXAMS  PREVIOUS:  07-21-18: 2-d echo:  1. Images are limited.  2. The left ventricle had a visually estimated ejection fraction of approximately 40-45%. Unable to assess focal wall motion. The cavity size was normal. There is moderately increased left ventricular wall thickness. Left ventricular diastolic Doppler  parameters are consistent with impaired relaxation.  3. The cavity was mildly enlarged.  4. The aortic valve is tricuspid. Moderate calcification of the aortic valve. Mild to moderate aortic annular calcification noted. Possible mild aortic stenosis.  5. The mitral valve is grossly normal. There is mild to moderate mitral annular calcification present.  6. The tricuspid valve was grossly normal.  7. The aortic root is normal in size and structure.  07-21-18: chest x-ray: Low lung volumes, with bibasilar opacities, potentially combination of atelectasis, scarring, and/or consolidation. Surgical changes of median sternotomy.  NO NEW EXAMS.    LABS REVIEWED PREVIOUS:   07-21-18: wbc 8.8; hgb 13.0; hct 40.5; mcv 91.6; plt 155; glucose 341l bun 59; creat 2.31; k+ 4.1; na++ 134; ca 9.4 BNP 126.0 hgb a1c 11.5  07-23-18: glucose 208; bun 55; creat 2.16; k+ 3.8; na++ 134; ca 8.7; phos 2.2; albumin 3.8  07-29-18: wbc 10.7; hgb 15.0; hct 46.7; mcv 91.0 plt 282  urine culture: e-coli: cipro  08-02-18: glucose 142; bun 72; creat 2.26 k+ 3.8; na++ 136 8.9  08-03-18: glucose 75 bun 66; creat 1.98;  k+ 3.9; na++ 137 ca 8.7  NO NEW LABS.     Review of Systems   Constitutional: Negative for malaise/fatigue.  Respiratory: Negative for cough and shortness of breath.   Cardiovascular: Negative for chest pain, palpitations and leg swelling.  Gastrointestinal: Negative for abdominal pain, constipation and heartburn.  Musculoskeletal: Negative for back pain, joint pain and myalgias.  Skin: Negative.   Neurological: Negative for dizziness.  Psychiatric/Behavioral: The patient is not nervous/anxious.     Physical Exam Constitutional:      General: He is not in acute distress.    Appearance: He is morbidly obese. He is not diaphoretic.  Neck:     Musculoskeletal: Neck supple.     Thyroid: No thyromegaly.  Cardiovascular:     Rate and Rhythm: Normal rate and regular rhythm.     Pulses: Normal pulses.     Heart sounds: Normal heart sounds.     Comments: History of CABG Pulmonary:     Effort: Pulmonary effort is normal. No respiratory distress.     Breath sounds: Normal breath sounds.     Comments: 02 dependent  Abdominal:     General: Bowel sounds are normal. There is no distension.     Palpations: Abdomen is soft.     Tenderness: There is no abdominal tenderness.  Musculoskeletal:     Right lower leg: Edema present.     Left lower leg: Edema present.     Comments: Trace bilateral lower extremity edema Is able to move all extremities   Lymphadenopathy:     Cervical: No cervical adenopathy.  Skin:    General: Skin is warm and dry.  Neurological:     Mental Status: He is alert and oriented to person, place, and time.  Psychiatric:        Mood and Affect: Mood normal.      ASSESSMENT/ PLAN:  TODAY:   1. Type 2 diabetes mellitus with stage 3 chronic kidney disease with long  term current use of insulin: hgb a1c 11.5 his cbg readings are no stable. Will continue novolog 10 units with meals; will change toujeo to 60 units twice daily and will monitor     MD is aware of resident's narcotic use and is in agreement with current plan of  care. We will attempt to wean resident as apropriate   Ok Edwards NP [facility]  Contact [phone] Monday through Friday 8am- 5pm  After hours call [phone]

## 2018-08-10 ENCOUNTER — Encounter: Payer: Self-pay | Admitting: Adult Health

## 2018-08-10 ENCOUNTER — Non-Acute Institutional Stay (SKILLED_NURSING_FACILITY): Payer: PPO | Admitting: Adult Health

## 2018-08-10 DIAGNOSIS — E1122 Type 2 diabetes mellitus with diabetic chronic kidney disease: Secondary | ICD-10-CM | POA: Diagnosis not present

## 2018-08-10 DIAGNOSIS — Z794 Long term (current) use of insulin: Secondary | ICD-10-CM

## 2018-08-10 DIAGNOSIS — N183 Chronic kidney disease, stage 3 (moderate): Secondary | ICD-10-CM | POA: Diagnosis not present

## 2018-08-10 DIAGNOSIS — I5042 Chronic combined systolic (congestive) and diastolic (congestive) heart failure: Secondary | ICD-10-CM

## 2018-08-10 NOTE — Progress Notes (Signed)
Location:   [place] Room Number: [place] of Service:  SNF (31)   CODE STATUS: DNR  Allergies  Allergen Reactions  . Zocor [Simvastatin] Other (See Comments)    fatigue  . Lipitor [Atorvastatin] Other (See Comments)    Muscle cramping    Chief Complaint  Patient presents with  . Acute Visit    Care Plan Meeting    HPI:  We have come together for his care plan meeting. He continues to participate in therapy. He did ambulate 100 feet yesterday with standby assist. He is participate in his morning ADLs. He scored a 9/15 on his SIMS is being followed by speech therapy will need further cognition testing. He denies any uncontrolled pain; no changes in appetite; no insomnia. No reports of fevers present.   Past Medical History:  Diagnosis Date  . Allergic rhinitis   . Anxiety   . Arthritis   . COPD (chronic obstructive pulmonary disease) ([place])   . Coronary atherosclerosis of native coronary artery    a. CABG x 4 in 1994. Previous stent placement to SVG to distal RCA;  b. DES to native Cx in 2006;  c. DES SVG to RCA 05/08/11; d. Inf MI/Cath/PCI: LM 25, LAD 100, LCX patent stent, 50 into OM, RI 60-70, RCA 100, LIMA->LAD nl, VG->Diag 100 old, VG->RCA/PL 99 (DES x 1), EF 50-55%.  . Depression    With anxious features/hx panic attacks  . Essential hypertension, benign   . GERD (gastroesophageal reflux disease)   . Hyperlipidemia    Not on statin 2/2 hx of intolerance.   . Morbid obesity ([place])   . Nephrolithiasis   . Pneumonia   . Sleep apnea   . ST elevation myocardial infarction (STEMI) of inferior wall ([place])    4/14 - DES SVG to PDA  . Type 2 diabetes mellitus ([place])     Past Surgical History:  Procedure Laterality Date  . CORONARY ARTERY BYPASS GRAFT  1994  . EYE SURGERY    . LEFT HEART CATHETERIZATION WITH CORONARY ANGIOGRAM N/A 05/08/2011   Procedure: LEFT HEART CATHETERIZATION WITH CORONARY ANGIOGRAM;  Surgeon: [name], MD;   Location: [facility] CATH LAB;  Service: Cardiovascular;  Laterality: N/A;  . LEFT HEART CATHETERIZATION WITH CORONARY ANGIOGRAM N/A 08/08/2012   Procedure: LEFT HEART CATHETERIZATION WITH CORONARY ANGIOGRAM;  Surgeon: [name], MD;  Location: [facility] CATH LAB;  Service: Cardiovascular;  Laterality: N/A;  . PERCUTANEOUS CORONARY STENT INTERVENTION (PCI-S) Right 05/08/2011   Procedure: PERCUTANEOUS CORONARY STENT INTERVENTION (PCI-S);  Surgeon: [name], MD;  Location: [facility] CATH LAB;  Service: Cardiovascular;  Laterality: Right;  . PERCUTANEOUS CORONARY STENT INTERVENTION (PCI-S) N/A 08/08/2012   Procedure: PERCUTANEOUS CORONARY STENT INTERVENTION (PCI-S);  Surgeon: [name], MD;  Location: [facility] CATH LAB;  Service: Cardiovascular;  Laterality: N/A;    Social History   Socioeconomic History  . Marital status: Married    Spouse name: Not on file  . Number of children: Not on file  . Years of education: Not on file  . Highest education level: Not on file  Occupational History  . Not on file  Social Needs  . Financial resource strain: Not on file  . Food insecurity:    Worry: Not on file    Inability: Not on file  . Transportation needs:    Medical: Not on file    Non-medical: Not on file  Tobacco Use  . Smoking status: Never Smoker  .  Smokeless tobacco: Never Used  Substance and Sexual Activity  . Alcohol use: No    Alcohol/week: 0.0 standard drinks  . Drug use: No  . Sexual activity: Never  Lifestyle  . Physical activity:    Days per week: Not on file    Minutes per session: Not on file  . Stress: Not on file  Relationships  . Social connections:    Talks on phone: Not on file    Gets together: Not on file    Attends religious service: Not on file    Active member of club or organization: Not on file    Attends meetings of clubs or organizations: Not on file    Relationship status: Not on file  . Intimate partner violence:    Fear of current or ex  partner: Not on file    Emotionally abused: Not on file    Physically abused: Not on file    Forced sexual activity: Not on file  Other Topics Concern  . Not on file  Social History Narrative  . Not on file   Family History  Problem Relation Age of Onset  . Heart attack Mother        Age 40      VITAL SIGNS BP 118/61   Pulse 66   Temp (!) 97.3 F (36.3 C)   Resp 18   Ht 5\' 5"  (1.651 m)   Wt 264 lb 8 oz (120 kg)   SpO2 96%   BMI 44.02 kg/m   Outpatient Encounter Medications as of 08/10/2018  Medication Sig  . allopurinol (ZYLOPRIM) 100 MG tablet Take 2 tablets (200 mg total) by mouth daily.  Marland Kitchen ALPRAZolam (XANAX) 0.5 MG tablet Take 1 tablet (0.5 mg total) by mouth at bedtime.  Marland Kitchen aspirin EC 81 MG tablet Take 81 mg by mouth every morning.  . clopidogrel (PLAVIX) 75 MG tablet take 1 tablet by mouth once daily WITH BREAKFAST  . hydrALAZINE (APRESOLINE) 10 MG tablet Take 1 tablet (10 mg total) by mouth every 8 (eight) hours.  . insulin aspart (NOVOLOG) 100 UNIT/ML injection Inject 10 Units into the skin 3 (three) times daily with meals. Give only if eats 50% or more of meal.  . Insulin Glargine, 2 Unit Dial, (TOUJEO MAX SOLOSTAR) 300 UNIT/ML SOPN Inject 60 Units into the skin 2 (two) times a day.  . isosorbide mononitrate (IMDUR) 30 MG 24 hr tablet Take 1 tablet (30 mg total) by mouth 2 (two) times daily.  . metoprolol tartrate (LOPRESSOR) 50 MG tablet Take 1 tablet (50 mg total) by mouth 2 (two) times daily.  . nitroGLYCERIN (NITROLINGUAL) 0.4 MG/SPRAY spray Place 1 spray under the tongue every 5 (five) minutes x 3 doses as needed for chest pain.  . NON FORMULARY Diet Type: NAS, Consistent Carbohydrate  . ondansetron (ZOFRAN) 4 MG tablet Take 4 mg by mouth every 8 (eight) hours as needed for nausea or vomiting.  . OXYGEN Inhale 2 L/min into the lungs continuous.  . polyethylene glycol powder (GLYCOLAX/MIRALAX) powder Take 17 g by mouth daily as needed (constipation).   .  Probiotic Product (RISA-BID PROBIOTIC) TABS Take 1 tablet by mouth 2 (two) times daily.  . psyllium (METAMUCIL) 58.6 % powder Take by mouth every morning.  . tamsulosin (FLOMAX) 0.4 MG CAPS capsule Take 0.4 mg by mouth every evening.   . torsemide (DEMADEX) 20 MG tablet Take 20 mg by mouth daily. Beginning 08/05/2018  . UNABLE TO FIND CPAP from home while  sleeping, at previous home settings  . [DISCONTINUED] TOUJEO SOLOSTAR 300 UNIT/ML SOPN Inject 65 Units into the skin 2 (two) times daily. (Patient not taking: Reported on 08/10/2018)   No facility-administered encounter medications on file as of 08/10/2018.      SIGNIFICANT DIAGNOSTIC EXAMS   PREVIOUS:  07-21-18: 2-d echo:  1. Images are limited.  2. The left ventricle had a visually estimated ejection fraction of approximately 40-45%. Unable to assess focal wall motion. The cavity size was normal. There is moderately increased left ventricular wall thickness. Left ventricular diastolic Doppler  parameters are consistent with impaired relaxation.  3. The cavity was mildly enlarged.  4. The aortic valve is tricuspid. Moderate calcification of the aortic valve. Mild to moderate aortic annular calcification noted. Possible mild aortic stenosis.  5. The mitral valve is grossly normal. There is mild to moderate mitral annular calcification present.  6. The tricuspid valve was grossly normal.  7. The aortic root is normal in size and structure.  07-21-18: chest x-ray: Low lung volumes, with bibasilar opacities, potentially combination of atelectasis, scarring, and/or consolidation. Surgical changes of median sternotomy.  NO NEW EXAMS.    LABS REVIEWED PREVIOUS:   07-21-18: wbc 8.8; hgb 13.0; hct 40.5; mcv 91.6; plt 155; glucose 341l bun 59; creat 2.31; k+ 4.1; na++ 134; ca 9.4 BNP 126.0 hgb a1c 11.5  07-23-18: glucose 208; bun 55; creat 2.16; k+ 3.8; na++ 134; ca 8.7; phos 2.2; albumin 3.8  07-29-18: wbc 10.7; hgb 15.0; hct 46.7; mcv 91.0 plt  282  urine culture: e-coli: cipro  08-02-18: glucose 142; bun 72; creat 2.26 k+ 3.8; na++ 136 8.9  08-03-18: glucose 75 bun 66; creat 1.98;  k+ 3.9; na++ 137 ca 8.7  NO NEW LABS.    Review of Systems  Constitutional: Negative for malaise/fatigue.  Respiratory: Negative for cough and shortness of breath.   Cardiovascular: Negative for chest pain, palpitations and leg swelling.  Gastrointestinal: Negative for abdominal pain, constipation and heartburn.  Musculoskeletal: Negative for back pain, joint pain and myalgias.  Skin: Negative.   Neurological: Negative for dizziness.  Psychiatric/Behavioral: The patient is not nervous/anxious.    Physical Exam Constitutional:      General: He is not in acute distress.    Appearance: He is morbidly obese. He is not diaphoretic.  Neck:     Musculoskeletal: Neck supple.     Thyroid: No thyromegaly.  Cardiovascular:     Rate and Rhythm: Normal rate and regular rhythm.     Pulses: Normal pulses.     Heart sounds: Normal heart sounds.     Comments: History of CABG Pulmonary:     Effort: Pulmonary effort is normal. No respiratory distress.     Breath sounds: Normal breath sounds.     Comments: 02 dependent  Abdominal:     General: Bowel sounds are normal. There is no distension.     Palpations: Abdomen is soft.     Tenderness: There is no abdominal tenderness.  Musculoskeletal:     Right lower leg: Edema present.     Left lower leg: Edema present.     Comments: Trace bilateral lower extremity edema Is able to move all extremities   Lymphadenopathy:     Cervical: No cervical adenopathy.  Skin:    General: Skin is warm and dry.  Neurological:     Mental Status: He is alert and oriented to person, place, and time.  Psychiatric:        Mood and  Affect: Mood normal.      ASSESSMENT/ PLAN:  TODAY:   1. Chronic combined systolic and diatsolic CHF (congestive heart failure) 2. Type 2 diabetes mellitus with stage 3 chronic kidney disease  with long term current use of insulin 3. obesity class III BMI 40-49.9 (morbid obesity)  Will continue therapy as directed Will continue current medication regimen Will continue to monitor his status.  His goal remains to return back home.   MD is aware of resident's narcotic use and is in agreement with current plan of care. We will attempt to wean resident as apropriate   Ok Edwards NP [facility]  Contact [phone] Monday through Friday 8am- 5pm  After hours call [phone]

## 2018-08-11 ENCOUNTER — Non-Acute Institutional Stay (SKILLED_NURSING_FACILITY): Payer: PPO | Admitting: Adult Health

## 2018-08-11 ENCOUNTER — Encounter: Payer: Self-pay | Admitting: Adult Health

## 2018-08-11 DIAGNOSIS — I10 Essential (primary) hypertension: Secondary | ICD-10-CM | POA: Diagnosis not present

## 2018-08-11 DIAGNOSIS — I25110 Atherosclerotic heart disease of native coronary artery with unstable angina pectoris: Secondary | ICD-10-CM

## 2018-08-11 DIAGNOSIS — I5033 Acute on chronic diastolic (congestive) heart failure: Secondary | ICD-10-CM

## 2018-08-11 NOTE — Progress Notes (Signed)
Location:   [place] Room Number: [address] Place of Service:  SNF (31)   CODE STATUS: DNR  Allergies  Allergen Reactions  . Zocor [Simvastatin] Other (See Comments)    fatigue  . Lipitor [Atorvastatin] Other (See Comments)    Muscle cramping    Chief Complaint  Patient presents with  . Medical Management of Chronic Issues    Coronary artery disease involving native coronary of native heart with unstable angina pectoris; acute on chronic diastolic heart failure; essential hypertension benign. Weekly follow up for the first 30 days post hospitalization.     HPI:  he is a 85 year old long term resident of this facility being seen for the management of her chronic illnesses: cad; chf; hypertension. He denies any chest pain; no shortness of breath. He denies any uncontrolled pain. There are no reports of fevers present.   Past Medical History:  Diagnosis Date  . Allergic rhinitis   . Anxiety   . Arthritis   . COPD (chronic obstructive pulmonary disease) ([place])   . Coronary atherosclerosis of native coronary artery    a. CABG x 4 in 1994. Previous stent placement to SVG to distal RCA;  b. DES to native Cx in 2006;  c. DES SVG to RCA 05/08/11; d. Inf MI/Cath/PCI: LM 25, LAD 100, LCX patent stent, 50 into OM, RI 60-70, RCA 100, LIMA->LAD nl, VG->Diag 100 old, VG->RCA/PL 99 (DES x 1), EF 50-55%.  . Depression    With anxious features/hx panic attacks  . Essential hypertension, benign   . GERD (gastroesophageal reflux disease)   . Hyperlipidemia    Not on statin 2/2 hx of intolerance.   . Morbid obesity ([place])   . Nephrolithiasis   . Pneumonia   . Sleep apnea   . ST elevation myocardial infarction (STEMI) of inferior wall ([place])    4/14 - DES SVG to PDA  . Type 2 diabetes mellitus ([place])     Past Surgical History:  Procedure Laterality Date  . CORONARY ARTERY BYPASS GRAFT  1994  . EYE SURGERY    . LEFT HEART CATHETERIZATION WITH CORONARY ANGIOGRAM N/A  05/08/2011   Procedure: LEFT HEART CATHETERIZATION WITH CORONARY ANGIOGRAM;  Surgeon: [name], MD;  Location: [facility] CATH LAB;  Service: Cardiovascular;  Laterality: N/A;  . LEFT HEART CATHETERIZATION WITH CORONARY ANGIOGRAM N/A 08/08/2012   Procedure: LEFT HEART CATHETERIZATION WITH CORONARY ANGIOGRAM;  Surgeon: [name], MD;  Location: [facility] CATH LAB;  Service: Cardiovascular;  Laterality: N/A;  . PERCUTANEOUS CORONARY STENT INTERVENTION (PCI-S) Right 05/08/2011   Procedure: PERCUTANEOUS CORONARY STENT INTERVENTION (PCI-S);  Surgeon: [name], MD;  Location: [facility] CATH LAB;  Service: Cardiovascular;  Laterality: Right;  . PERCUTANEOUS CORONARY STENT INTERVENTION (PCI-S) N/A 08/08/2012   Procedure: PERCUTANEOUS CORONARY STENT INTERVENTION (PCI-S);  Surgeon: [name], MD;  Location: [facility] CATH LAB;  Service: Cardiovascular;  Laterality: N/A;    Social History   Socioeconomic History  . Marital status: Married    Spouse name: Not on file  . Number of children: Not on file  . Years of education: Not on file  . Highest education level: Not on file  Occupational History  . Not on file  Social Needs  . Financial resource strain: Not on file  . Food insecurity:    Worry: Not on file    Inability: Not on file  . Transportation needs:    Medical: Not on file  Non-medical: Not on file  Tobacco Use  . Smoking status: Never Smoker  . Smokeless tobacco: Never Used  Substance and Sexual Activity  . Alcohol use: No    Alcohol/week: 0.0 standard drinks  . Drug use: No  . Sexual activity: Never  Lifestyle  . Physical activity:    Days per week: Not on file    Minutes per session: Not on file  . Stress: Not on file  Relationships  . Social connections:    Talks on phone: Not on file    Gets together: Not on file    Attends religious service: Not on file    Active member of club or organization: Not on file    Attends meetings of clubs or organizations: Not  on file    Relationship status: Not on file  . Intimate partner violence:    Fear of current or ex partner: Not on file    Emotionally abused: Not on file    Physically abused: Not on file    Forced sexual activity: Not on file  Other Topics Concern  . Not on file  Social History Narrative  . Not on file   Family History  Problem Relation Age of Onset  . Heart attack Mother        Age 52      VITAL SIGNS BP 132/69   Pulse 70   Temp 98.1 F (36.7 C)   Resp 20   Ht 5\' 5"  (1.651 m)   Wt 266 lb (120.7 kg)   SpO2 96%   BMI 44.26 kg/m   Outpatient Encounter Medications as of 08/11/2018  Medication Sig  . allopurinol (ZYLOPRIM) 100 MG tablet Take 2 tablets (200 mg total) by mouth daily.  Marland Kitchen ALPRAZolam (XANAX) 0.5 MG tablet Take 1 tablet (0.5 mg total) by mouth at bedtime.  Marland Kitchen aspirin EC 81 MG tablet Take 81 mg by mouth every morning.  . clopidogrel (PLAVIX) 75 MG tablet take 1 tablet by mouth once daily WITH BREAKFAST  . hydrALAZINE (APRESOLINE) 10 MG tablet Take 1 tablet (10 mg total) by mouth every 8 (eight) hours.  . insulin aspart (NOVOLOG) 100 UNIT/ML injection Inject 10 Units into the skin 3 (three) times daily with meals. Give only if eats 50% or more of meal.  . Insulin Glargine, 2 Unit Dial, (TOUJEO MAX SOLOSTAR) 300 UNIT/ML SOPN Inject 60 Units into the skin 2 (two) times a day.  . isosorbide mononitrate (IMDUR) 30 MG 24 hr tablet Take 1 tablet (30 mg total) by mouth 2 (two) times daily.  . metoprolol tartrate (LOPRESSOR) 50 MG tablet Take 1 tablet (50 mg total) by mouth 2 (two) times daily.  . nitroGLYCERIN (NITROLINGUAL) 0.4 MG/SPRAY spray Place 1 spray under the tongue every 5 (five) minutes x 3 doses as needed for chest pain.  . NON FORMULARY Diet Type: NAS, Consistent Carbohydrate  . ondansetron (ZOFRAN) 4 MG tablet Take 4 mg by mouth every 8 (eight) hours as needed for nausea or vomiting.  . OXYGEN Inhale 2 L/min into the lungs continuous.  . polyethylene glycol  powder (GLYCOLAX/MIRALAX) powder Take 17 g by mouth daily as needed (constipation).   . Probiotic Product (RISA-BID PROBIOTIC) TABS Take 1 tablet by mouth 2 (two) times daily.  . psyllium (METAMUCIL) 58.6 % powder Take by mouth every morning.  . tamsulosin (FLOMAX) 0.4 MG CAPS capsule Take 0.4 mg by mouth every evening.   . torsemide (DEMADEX) 20 MG tablet Take 20 mg by mouth  daily. Beginning 08/05/2018  . UNABLE TO FIND CPAP from home while sleeping, at previous home settings   No facility-administered encounter medications on file as of 08/11/2018.      SIGNIFICANT DIAGNOSTIC EXAMS   PREVIOUS:  07-21-18: 2-d echo:  1. Images are limited.  2. The left ventricle had a visually estimated ejection fraction of approximately 40-45%. Unable to assess focal wall motion. The cavity size was normal. There is moderately increased left ventricular wall thickness. Left ventricular diastolic Doppler  parameters are consistent with impaired relaxation.  3. The cavity was mildly enlarged.  4. The aortic valve is tricuspid. Moderate calcification of the aortic valve. Mild to moderate aortic annular calcification noted. Possible mild aortic stenosis.  5. The mitral valve is grossly normal. There is mild to moderate mitral annular calcification present.  6. The tricuspid valve was grossly normal.  7. The aortic root is normal in size and structure.  07-21-18: chest x-ray: Low lung volumes, with bibasilar opacities, potentially combination of atelectasis, scarring, and/or consolidation. Surgical changes of median sternotomy.  NO NEW EXAMS.    LABS REVIEWED PREVIOUS:   07-21-18: wbc 8.8; hgb 13.0; hct 40.5; mcv 91.6; plt 155; glucose 341l bun 59; creat 2.31; k+ 4.1; na++ 134; ca 9.4 BNP 126.0 hgb a1c 11.5  07-23-18: glucose 208; bun 55; creat 2.16; k+ 3.8; na++ 134; ca 8.7; phos 2.2; albumin 3.8  07-29-18: wbc 10.7; hgb 15.0; hct 46.7; mcv 91.0 plt 282  urine culture: e-coli: cipro  08-02-18: glucose 142;  bun 72; creat 2.26 k+ 3.8; na++ 136 8.9  08-03-18: glucose 75 bun 66; creat 1.98;  k+ 3.9; na++ 137 ca 8.7  NO NEW LABS.    Review of Systems  Constitutional: Negative for malaise/fatigue.  Respiratory: Negative for cough and shortness of breath.   Cardiovascular: Negative for chest pain, palpitations and leg swelling.  Gastrointestinal: Negative for abdominal pain, constipation and heartburn.  Musculoskeletal: Negative for back pain, joint pain and myalgias.  Skin: Negative.   Neurological: Negative for dizziness.  Psychiatric/Behavioral: The patient is not nervous/anxious.    Physical Exam Constitutional:      General: He is not in acute distress.    Appearance: He is well-developed. He is morbidly obese. He is not diaphoretic.  Neck:     Musculoskeletal: Neck supple.     Thyroid: No thyromegaly.  Cardiovascular:     Rate and Rhythm: Normal rate and regular rhythm.     Pulses: Normal pulses.     Heart sounds: Normal heart sounds.     Comments: History of CABG  Pulmonary:     Effort: Pulmonary effort is normal. No respiratory distress.     Breath sounds: Normal breath sounds.     Comments: 02 dependent  Abdominal:     General: Bowel sounds are normal. There is no distension.     Palpations: Abdomen is soft.     Tenderness: There is no abdominal tenderness.  Musculoskeletal:     Right lower leg: No edema.     Left lower leg: No edema.     Comments: Trace bilateral lower extremity edema Is able to move all extremities    Lymphadenopathy:     Cervical: No cervical adenopathy.  Skin:    General: Skin is warm and dry.  Neurological:     Mental Status: He is alert and oriented to person, place, and time.     ASSESSMENT/ PLAN:  TODAY:   1. Coronary artery disease involving native coronary artery  of native heart with unstable angina: is stable at this time will continue asa 81 mg daily plavix 75 mg daily imdur 30 mg twice daily has prn ntg  2. Acute on chronic diastolic  heart failure/secondary cardiomyopathy: is stable EF 40-45% (07-21-18); will continue demadex 20 mg daily lopressor 50 mg twice daily and imdur 30 mg twice daily   3. Essential hypertension, benign; is stable b/p  132/79 will continue apresoline 10 mg every 8 hours lopressor 50 mg twice daily   PREVIOUS   4. Type 2 diabetes mellitus with stage 3 chronic kidney disease with long term current use of insulin: is stable hgb a1c 11.5 will continue toujeo 60 units twice daily and novolog 10 units with meals.   5. Obesity class III BMI 40-49.9 (morbid obesity) without change BMI 44.35 will monitor   6. BPH without urinary obstruction: is stable will continue flomax 0.4 mg daily   7. Chronic gout due to renal impairment: is without recent flares will continue allopurinol 200 mg daily   8. Depression with anxiety: does have history of panic attacks; will continue xanax 0.5 mg twice daily for total of 14 days.   9. E-coli UTI: will complete cipro and will monitor his status.   10. Obstructive sleep apnea: is stable will continue CPAP nightly   11. CKD stage 3 due to type 2 diabetes mellitus: is stable bun 66; creat 1.98 will monitor   12. Chronic constipation: is stable will continue metamucil daily and miralax daily as needed  13. COPD is stable is 02 dependent will monitor    MD is aware of resident's narcotic use and is in agreement with current plan of care. We will attempt to wean resident as apropriate   Ok Edwards NP [facility]  Contact [phone] Monday through Friday 8am- 5pm  After hours call [phone]

## 2018-08-12 ENCOUNTER — Non-Acute Institutional Stay (SKILLED_NURSING_FACILITY): Payer: PPO | Admitting: Adult Health

## 2018-08-12 ENCOUNTER — Encounter: Payer: Self-pay | Admitting: Adult Health

## 2018-08-12 DIAGNOSIS — E1122 Type 2 diabetes mellitus with diabetic chronic kidney disease: Secondary | ICD-10-CM | POA: Diagnosis not present

## 2018-08-12 DIAGNOSIS — N183 Chronic kidney disease, stage 3 unspecified: Secondary | ICD-10-CM

## 2018-08-12 DIAGNOSIS — Z794 Long term (current) use of insulin: Secondary | ICD-10-CM | POA: Diagnosis not present

## 2018-08-12 NOTE — Progress Notes (Signed)
Location:   [place] Room Number: 129 P Place of Service:  SNF (31)   CODE STATUS: DNR  Allergies  Allergen Reactions   Zocor [Simvastatin] Other (See Comments)    fatigue   Lipitor [Atorvastatin] Other (See Comments)    Muscle cramping    Chief Complaint  Patient presents with   Acute Visit    DM    HPI:  He has had some low cbg readings in the 40's. He is presently on toujeo 60 units twice daily and novolog 10 units with meals. He denies any changes in his appetite; no excessive thirst no excessive urination.   Past Medical History:  Diagnosis Date   Allergic rhinitis    Anxiety    Arthritis    COPD (chronic obstructive pulmonary disease) (HCC)    Coronary atherosclerosis of native coronary artery    a. CABG x 4 in 1994. Previous stent placement to SVG to distal RCA;  b. DES to native Cx in 2006;  c. DES SVG to RCA 05/08/11; d. Inf MI/Cath/PCI: LM 25, LAD 100, LCX patent stent, 50 into OM, RI 60-70, RCA 100, LIMA->LAD nl, VG->Diag 100 old, VG->RCA/PL 99 (DES x 1), EF 50-55%.   Depression    With anxious features/hx panic attacks   Essential hypertension, benign    GERD (gastroesophageal reflux disease)    Hyperlipidemia    Not on statin 2/2 hx of intolerance.    Morbid obesity ([place])    Nephrolithiasis    Pneumonia    Sleep apnea    ST elevation myocardial infarction (STEMI) of inferior wall ([place])    4/14 - DES SVG to PDA   Type 2 diabetes mellitus ([place])     Past Surgical History:  Procedure Laterality Date   CORONARY ARTERY BYPASS GRAFT  1994   EYE SURGERY     LEFT HEART CATHETERIZATION WITH CORONARY ANGIOGRAM N/A 05/08/2011   Procedure: LEFT HEART CATHETERIZATION WITH CORONARY ANGIOGRAM;  Surgeon: [name], MD;  Location: [facility] CATH LAB;  Service: Cardiovascular;  Laterality: N/A;   LEFT HEART CATHETERIZATION WITH CORONARY ANGIOGRAM N/A 08/08/2012   Procedure: LEFT HEART CATHETERIZATION WITH CORONARY  ANGIOGRAM;  Surgeon: [name], MD;  Location: [facility] CATH LAB;  Service: Cardiovascular;  Laterality: N/A;   PERCUTANEOUS CORONARY STENT INTERVENTION (PCI-S) Right 05/08/2011   Procedure: PERCUTANEOUS CORONARY STENT INTERVENTION (PCI-S);  Surgeon: [name], MD;  Location: [facility] CATH LAB;  Service: Cardiovascular;  Laterality: Right;   PERCUTANEOUS CORONARY STENT INTERVENTION (PCI-S) N/A 08/08/2012   Procedure: PERCUTANEOUS CORONARY STENT INTERVENTION (PCI-S);  Surgeon: [name], MD;  Location: [facility] CATH LAB;  Service: Cardiovascular;  Laterality: N/A;    Social History   Socioeconomic History   Marital status: Married    Spouse name: Not on file   Number of children: Not on file   Years of education: Not on file   Highest education level: Not on file  Occupational History   Not on file  Social Needs   Financial resource strain: Not on file   Food insecurity:    Worry: Not on file    Inability: Not on file   Transportation needs:    Medical: Not on file    Non-medical: Not on file  Tobacco Use   Smoking status: Never Smoker   Smokeless tobacco: Never Used  Substance and Sexual Activity   Alcohol use: No    Alcohol/week: 0.0 standard drinks   Drug use: No  Sexual activity: Never  Lifestyle   Physical activity:    Days per week: Not on file    Minutes per session: Not on file   Stress: Not on file  Relationships   Social connections:    Talks on phone: Not on file    Gets together: Not on file    Attends religious service: Not on file    Active member of club or organization: Not on file    Attends meetings of clubs or organizations: Not on file    Relationship status: Not on file   Intimate partner violence:    Fear of current or ex partner: Not on file    Emotionally abused: Not on file    Physically abused: Not on file    Forced sexual activity: Not on file  Other Topics Concern   Not on file  Social History Narrative     Not on file   Family History  Problem Relation Age of Onset   Heart attack Mother        Age 39      VITAL SIGNS BP 138/74    Pulse 60    Temp 98.4 F (36.9 C)    Resp 20    Ht 5\' 5"  (1.651 m)    Wt 267 lb 8 oz (121.3 kg)    SpO2 97%    BMI 44.51 kg/m   Outpatient Encounter Medications as of 08/12/2018  Medication Sig   allopurinol (ZYLOPRIM) 100 MG tablet Take 2 tablets (200 mg total) by mouth daily.   ALPRAZolam (XANAX) 0.5 MG tablet Take 1 tablet (0.5 mg total) by mouth at bedtime.   aspirin EC 81 MG tablet Take 81 mg by mouth every morning.   clopidogrel (PLAVIX) 75 MG tablet take 1 tablet by mouth once daily WITH BREAKFAST   hydrALAZINE (APRESOLINE) 10 MG tablet Take 1 tablet (10 mg total) by mouth every 8 (eight) hours.   insulin aspart (NOVOLOG) 100 UNIT/ML injection Inject 10 Units into the skin 3 (three) times daily with meals. Give only if eats 50% or more of meal.   Insulin Glargine, 2 Unit Dial, (TOUJEO MAX SOLOSTAR) 300 UNIT/ML SOPN Inject 60 Units into the skin 2 (two) times a day.   isosorbide mononitrate (IMDUR) 30 MG 24 hr tablet Take 1 tablet (30 mg total) by mouth 2 (two) times daily.   metoprolol tartrate (LOPRESSOR) 50 MG tablet Take 1 tablet (50 mg total) by mouth 2 (two) times daily.   nitroGLYCERIN (NITROLINGUAL) 0.4 MG/SPRAY spray Place 1 spray under the tongue every 5 (five) minutes x 3 doses as needed for chest pain.   NON FORMULARY Diet Type: NAS, Consistent Carbohydrate   ondansetron (ZOFRAN) 4 MG tablet Take 4 mg by mouth every 8 (eight) hours as needed for nausea or vomiting.   OXYGEN Inhale 2 L/min into the lungs continuous.   polyethylene glycol powder (GLYCOLAX/MIRALAX) powder Take 17 g by mouth daily as needed (constipation).    psyllium (METAMUCIL) 58.6 % powder Take by mouth every morning.   tamsulosin (FLOMAX) 0.4 MG CAPS capsule Take 0.4 mg by mouth every evening.    torsemide (DEMADEX) 20 MG tablet Take 20 mg by mouth daily.  Beginning 08/05/2018   UNABLE TO FIND CPAP from home while sleeping, at previous home settings   No facility-administered encounter medications on file as of 08/12/2018.      SIGNIFICANT DIAGNOSTIC EXAMS   PREVIOUS:  07-21-18: 2-d echo:  1. Images are limited.  2. The left ventricle had a visually estimated ejection fraction of approximately 40-45%. Unable to assess focal wall motion. The cavity size was normal. There is moderately increased left ventricular wall thickness. Left ventricular diastolic Doppler  parameters are consistent with impaired relaxation.  3. The cavity was mildly enlarged.  4. The aortic valve is tricuspid. Moderate calcification of the aortic valve. Mild to moderate aortic annular calcification noted. Possible mild aortic stenosis.  5. The mitral valve is grossly normal. There is mild to moderate mitral annular calcification present.  6. The tricuspid valve was grossly normal.  7. The aortic root is normal in size and structure.  07-21-18: chest x-ray: Low lung volumes, with bibasilar opacities, potentially combination of atelectasis, scarring, and/or consolidation. Surgical changes of median sternotomy.  NO NEW EXAMS.    LABS REVIEWED PREVIOUS:   07-21-18: wbc 8.8; hgb 13.0; hct 40.5; mcv 91.6; plt 155; glucose 341l bun 59; creat 2.31; k+ 4.1; na++ 134; ca 9.4 BNP 126.0 hgb a1c 11.5  07-23-18: glucose 208; bun 55; creat 2.16; k+ 3.8; na++ 134; ca 8.7; phos 2.2; albumin 3.8  07-29-18: wbc 10.7; hgb 15.0; hct 46.7; mcv 91.0 plt 282  urine culture: e-coli: cipro  08-02-18: glucose 142; bun 72; creat 2.26 k+ 3.8; na++ 136 8.9  08-03-18: glucose 75 bun 66; creat 1.98;  k+ 3.9; na++ 137 ca 8.7  NO NEW LABS.    Review of Systems  Constitutional: Negative for malaise/fatigue.  Respiratory: Negative for cough and shortness of breath.   Cardiovascular: Negative for chest pain, palpitations and leg swelling.  Gastrointestinal: Negative for abdominal pain, constipation and  heartburn.  Musculoskeletal: Negative for back pain, joint pain and myalgias.  Skin: Negative.   Neurological: Negative for dizziness.  Endo/Heme/Allergies:       Had low cbg readings in the 40's this AM   Psychiatric/Behavioral: The patient is not nervous/anxious.     Physical Exam Constitutional:      General: He is not in acute distress.    Appearance: He is morbidly obese. He is not diaphoretic.  Neck:     Musculoskeletal: Neck supple.     Thyroid: No thyromegaly.  Cardiovascular:     Rate and Rhythm: Normal rate and regular rhythm.     Pulses: Normal pulses.     Heart sounds: Normal heart sounds.     Comments: History of CABG  Pulmonary:     Effort: Pulmonary effort is normal. No respiratory distress.     Breath sounds: Normal breath sounds.     Comments: 02 dependent  Abdominal:     General: Bowel sounds are normal. There is no distension.     Palpations: Abdomen is soft.     Tenderness: There is no abdominal tenderness.  Musculoskeletal:     Right lower leg: Edema present.     Left lower leg: Edema present.     Comments: Trace bilateral lower extremity edema Is able to move all extremities     Lymphadenopathy:     Cervical: No cervical adenopathy.  Skin:    General: Skin is warm and dry.  Neurological:     Mental Status: He is alert and oriented to person, place, and time.  Psychiatric:        Mood and Affect: Mood normal.     ASSESSMENT/ PLAN:  TODAY:   1. Type 2 diabetes mellitus with stage 3 chronic kidney disease with long term current use of insulin: cbgs too low: will lower novolog to 7  units with meals; and will lower toujeo to 60 units nightly   MD is aware of resident's narcotic use and is in agreement with current plan of care. We will attempt to wean resident as apropriate   Ok Edwards NP [facility]  Contact [phone] Monday through Friday 8am- 5pm  After hours call [phone]

## 2018-08-17 ENCOUNTER — Non-Acute Institutional Stay (SKILLED_NURSING_FACILITY): Payer: PPO | Admitting: Adult Health

## 2018-08-17 ENCOUNTER — Other Ambulatory Visit: Payer: Self-pay | Admitting: Adult Health

## 2018-08-17 ENCOUNTER — Encounter: Payer: Self-pay | Admitting: Adult Health

## 2018-08-17 DIAGNOSIS — I5042 Chronic combined systolic (congestive) and diastolic (congestive) heart failure: Secondary | ICD-10-CM | POA: Diagnosis not present

## 2018-08-17 DIAGNOSIS — E1122 Type 2 diabetes mellitus with diabetic chronic kidney disease: Secondary | ICD-10-CM

## 2018-08-17 DIAGNOSIS — N183 Chronic kidney disease, stage 3 (moderate): Secondary | ICD-10-CM

## 2018-08-17 DIAGNOSIS — I25110 Atherosclerotic heart disease of native coronary artery with unstable angina pectoris: Secondary | ICD-10-CM | POA: Diagnosis not present

## 2018-08-17 DIAGNOSIS — Z794 Long term (current) use of insulin: Secondary | ICD-10-CM | POA: Diagnosis not present

## 2018-08-17 MED ORDER — TORSEMIDE 20 MG PO TABS: 20.0000 mg | ORAL_TABLET | Freq: Every day | ORAL | 0 refills | Status: DC

## 2018-08-17 MED ORDER — INSULIN ASPART 100 UNIT/ML ~~LOC~~ SOLN: 7.0000 [IU] | Freq: Three times a day (TID) | SUBCUTANEOUS | 0 refills | Status: DC

## 2018-08-17 MED ORDER — METOPROLOL TARTRATE 50 MG PO TABS: 50.0000 mg | ORAL_TABLET | Freq: Two times a day (BID) | ORAL | 0 refills | Status: DC

## 2018-08-17 MED ORDER — ALLOPURINOL 100 MG PO TABS: 200.0000 mg | ORAL_TABLET | Freq: Every day | ORAL | 0 refills | Status: DC

## 2018-08-17 MED ORDER — TAMSULOSIN HCL 0.4 MG PO CAPS: 0.4000 mg | ORAL_CAPSULE | Freq: Every evening | ORAL | 0 refills | Status: DC

## 2018-08-17 MED ORDER — CLOPIDOGREL BISULFATE 75 MG PO TABS: ORAL_TABLET | ORAL | 0 refills | Status: DC

## 2018-08-17 MED ORDER — INSULIN GLARGINE (2 UNIT DIAL) 300 UNIT/ML ~~LOC~~ SOPN: 60.0000 [IU] | PEN_INJECTOR | Freq: Every day | SUBCUTANEOUS | 0 refills | Status: DC

## 2018-08-17 MED ORDER — NITROGLYCERIN 0.4 MG/SPRAY TL SOLN: 1.0000 | 0 refills | Status: DC | PRN

## 2018-08-17 MED ORDER — ISOSORBIDE MONONITRATE ER 30 MG PO TB24: 30.0000 mg | ORAL_TABLET | Freq: Two times a day (BID) | ORAL | 0 refills | Status: DC

## 2018-08-17 MED ORDER — HYDRALAZINE HCL 10 MG PO TABS: 10.0000 mg | ORAL_TABLET | Freq: Three times a day (TID) | ORAL | 0 refills | Status: DC

## 2018-08-17 MED ORDER — ONDANSETRON HCL 4 MG PO TABS: 4.0000 mg | ORAL_TABLET | Freq: Three times a day (TID) | ORAL | 0 refills | Status: DC | PRN

## 2018-08-17 MED ORDER — ALPRAZOLAM 0.5 MG PO TABS: 0.5000 mg | ORAL_TABLET | Freq: Every day | ORAL | 0 refills | Status: DC

## 2018-08-17 NOTE — Progress Notes (Signed)
Location:    [place] Room Number: 129/P Place of Service:  SNF (31)    CODE STATUS: DNR  Allergies  Allergen Reactions  . Zocor [Simvastatin] Other (See Comments)    fatigue  . Lipitor [Atorvastatin] Other (See Comments)    Muscle cramping    Chief Complaint  Patient presents with  . Discharge Note    Discharge Visit    HPI:  He is being discharged to home on 08-20-18 with home health for pt/ot/rn/cna. He will not need any dme. He has been on long term 02 prior to his hospitalization. He will need his prescriptions written and will need to follow up with his medical provider. He had been hospitalized for unstable angina. He was admitted to this facility for short term rehab; he has completed snf therapy and is ready to complete therapy on a home health basis.    Past Medical History:  Diagnosis Date  . Allergic rhinitis   . Anxiety   . Arthritis   . COPD (chronic obstructive pulmonary disease) ([place])   . Coronary atherosclerosis of native coronary artery    a. CABG x 4 in 1994. Previous stent placement to SVG to distal RCA;  b. DES to native Cx in 2006;  c. DES SVG to RCA 05/08/11; d. Inf MI/Cath/PCI: LM 25, LAD 100, LCX patent stent, 50 into OM, RI 60-70, RCA 100, LIMA->LAD nl, VG->Diag 100 old, VG->RCA/PL 99 (DES x 1), EF 50-55%.  . Depression    With anxious features/hx panic attacks  . Essential hypertension, benign   . GERD (gastroesophageal reflux disease)   . Hyperlipidemia    Not on statin 2/2 hx of intolerance.   . Morbid obesity ([place])   . Nephrolithiasis   . Pneumonia   . Sleep apnea   . ST elevation myocardial infarction (STEMI) of inferior wall ([place])    4/14 - DES SVG to PDA  . Type 2 diabetes mellitus ([place])     Past Surgical History:  Procedure Laterality Date  . CORONARY ARTERY BYPASS GRAFT  1994  . EYE SURGERY    . LEFT HEART CATHETERIZATION WITH CORONARY ANGIOGRAM N/A 05/08/2011   Procedure: LEFT HEART CATHETERIZATION WITH  CORONARY ANGIOGRAM;  Surgeon: [name], MD;  Location: [facility] CATH LAB;  Service: Cardiovascular;  Laterality: N/A;  . LEFT HEART CATHETERIZATION WITH CORONARY ANGIOGRAM N/A 08/08/2012   Procedure: LEFT HEART CATHETERIZATION WITH CORONARY ANGIOGRAM;  Surgeon: [name], MD;  Location: [facility] CATH LAB;  Service: Cardiovascular;  Laterality: N/A;  . PERCUTANEOUS CORONARY STENT INTERVENTION (PCI-S) Right 05/08/2011   Procedure: PERCUTANEOUS CORONARY STENT INTERVENTION (PCI-S);  Surgeon: [name], MD;  Location: [facility] CATH LAB;  Service: Cardiovascular;  Laterality: Right;  . PERCUTANEOUS CORONARY STENT INTERVENTION (PCI-S) N/A 08/08/2012   Procedure: PERCUTANEOUS CORONARY STENT INTERVENTION (PCI-S);  Surgeon: [name], MD;  Location: [facility] CATH LAB;  Service: Cardiovascular;  Laterality: N/A;    Social History   Socioeconomic History  . Marital status: Married    Spouse name: Not on file  . Number of children: Not on file  . Years of education: Not on file  . Highest education level: Not on file  Occupational History  . Not on file  Social Needs  . Financial resource strain: Not on file  . Food insecurity:    Worry: Not on file    Inability: Not on file  . Transportation needs:    Medical: Not on file  Non-medical: Not on file  Tobacco Use  . Smoking status: Never Smoker  . Smokeless tobacco: Never Used  Substance and Sexual Activity  . Alcohol use: No    Alcohol/week: 0.0 standard drinks  . Drug use: No  . Sexual activity: Never  Lifestyle  . Physical activity:    Days per week: Not on file    Minutes per session: Not on file  . Stress: Not on file  Relationships  . Social connections:    Talks on phone: Not on file    Gets together: Not on file    Attends religious service: Not on file    Active member of club or organization: Not on file    Attends meetings of clubs or organizations: Not on file    Relationship status: Not on file  .  Intimate partner violence:    Fear of current or ex partner: Not on file    Emotionally abused: Not on file    Physically abused: Not on file    Forced sexual activity: Not on file  Other Topics Concern  . Not on file  Social History Narrative  . Not on file   Family History  Problem Relation Age of Onset  . Heart attack Mother        Age 19    VITAL SIGNS BP (!) 149/65   Pulse 74   Temp (!) 97.2 F (36.2 C) (Oral)   Resp 20   Ht 5\' 5"  (1.651 m)   Wt 266 lb (120.7 kg)   SpO2 97%   BMI 44.26 kg/m   Patient's Medications  New Prescriptions   No medications on file  Previous Medications   ACETAMINOPHEN (TYLENOL) 325 MG TABLET    Take 650 mg by mouth every 4 (four) hours as needed.   ALLOPURINOL (ZYLOPRIM) 100 MG TABLET    Take 2 tablets (200 mg total) by mouth daily.   ALPRAZOLAM (XANAX) 0.5 MG TABLET    Take 1 tablet (0.5 mg total) by mouth at bedtime.   ASPIRIN EC 81 MG TABLET    Take 81 mg by mouth every morning.   CLOPIDOGREL (PLAVIX) 75 MG TABLET    take 1 tablet by mouth once daily WITH BREAKFAST   HYDRALAZINE (APRESOLINE) 10 MG TABLET    Take 1 tablet (10 mg total) by mouth every 8 (eight) hours.   INSULIN ASPART (NOVOLOG) 100 UNIT/ML INJECTION    Inject 7 Units into the skin 3 (three) times daily with meals. Give only if eats 50% or more of meal.   INSULIN GLARGINE, 2 UNIT DIAL, (TOUJEO MAX SOLOSTAR) 300 UNIT/ML SOPN    Inject 60 Units into the skin at bedtime.   ISOSORBIDE MONONITRATE (IMDUR) 30 MG 24 HR TABLET    Take 1 tablet (30 mg total) by mouth 2 (two) times daily.   METOPROLOL TARTRATE (LOPRESSOR) 50 MG TABLET    Take 1 tablet (50 mg total) by mouth 2 (two) times daily.   NITROGLYCERIN (NITROLINGUAL) 0.4 MG/SPRAY SPRAY    Place 1 spray under the tongue every 5 (five) minutes x 3 doses as needed for chest pain.   NON FORMULARY    Diet Type: NAS, Consistent Carbohydrate   ONDANSETRON (ZOFRAN) 4 MG TABLET    Take 1 tablet (4 mg total) by mouth every 8 (eight)  hours as needed for nausea or vomiting.   OXYGEN    Inhale 2 L/min into the lungs continuous.   POLYETHYLENE GLYCOL POWDER (GLYCOLAX/MIRALAX) POWDER  Take 17 g by mouth daily as needed (constipation).    PSYLLIUM (METAMUCIL) 58.6 % POWDER    Take 2 tsp by mouth: Mix with 8 ounces liquid and drink once a day   TAMSULOSIN (FLOMAX) 0.4 MG CAPS CAPSULE    Take 1 capsule (0.4 mg total) by mouth every evening.   TORSEMIDE (DEMADEX) 20 MG TABLET    Take 1 tablet (20 mg total) by mouth daily. Beginning 08/05/2018   UNABLE TO FIND    CPAP from home while sleeping, at previous home settings  Modified Medications   No medications on file  Discontinued Medications   No medications on file     SIGNIFICANT DIAGNOSTIC EXAMS   PREVIOUS:  07-21-18: 2-d echo:  1. Images are limited.  2. The left ventricle had a visually estimated ejection fraction of approximately 40-45%. Unable to assess focal wall motion. The cavity size was normal. There is moderately increased left ventricular wall thickness. Left ventricular diastolic Doppler  parameters are consistent with impaired relaxation.  3. The cavity was mildly enlarged.  4. The aortic valve is tricuspid. Moderate calcification of the aortic valve. Mild to moderate aortic annular calcification noted. Possible mild aortic stenosis.  5. The mitral valve is grossly normal. There is mild to moderate mitral annular calcification present.  6. The tricuspid valve was grossly normal.  7. The aortic root is normal in size and structure.  07-21-18: chest x-ray: Low lung volumes, with bibasilar opacities, potentially combination of atelectasis, scarring, and/or consolidation. Surgical changes of median sternotomy.  NO NEW EXAMS.    LABS REVIEWED PREVIOUS:   07-21-18: wbc 8.8; hgb 13.0; hct 40.5; mcv 91.6; plt 155; glucose 341l bun 59; creat 2.31; k+ 4.1; na++ 134; ca 9.4 BNP 126.0 hgb a1c 11.5  07-23-18: glucose 208; bun 55; creat 2.16; k+ 3.8; na++ 134; ca 8.7;  phos 2.2; albumin 3.8  07-29-18: wbc 10.7; hgb 15.0; hct 46.7; mcv 91.0 plt 282  urine culture: e-coli: cipro  08-02-18: glucose 142; bun 72; creat 2.26 k+ 3.8; na++ 136 8.9  08-03-18: glucose 75 bun 66; creat 1.98;  k+ 3.9; na++ 137 ca 8.7  NO NEW LABS.    Review of Systems  Constitutional: Negative for malaise/fatigue.  Respiratory: Negative for cough and shortness of breath.   Cardiovascular: Negative for chest pain, palpitations and leg swelling.  Gastrointestinal: Negative for abdominal pain, constipation and heartburn.  Musculoskeletal: Negative for back pain, joint pain and myalgias.  Skin: Negative.   Neurological: Negative for dizziness.  Psychiatric/Behavioral: The patient is not nervous/anxious.     Physical Exam Constitutional:      General: He is not in acute distress.    Appearance: He is morbidly obese. He is not diaphoretic.  Neck:     Musculoskeletal: Neck supple.     Thyroid: No thyromegaly.  Cardiovascular:     Rate and Rhythm: Normal rate and regular rhythm.     Pulses: Normal pulses.     Heart sounds: Normal heart sounds.     Comments: History of CABG  Pulmonary:     Effort: Pulmonary effort is normal. No respiratory distress.     Breath sounds: Normal breath sounds.     Comments: 02 dependent  Abdominal:     General: Bowel sounds are normal. There is no distension.     Palpations: Abdomen is soft.     Tenderness: There is no abdominal tenderness.  Musculoskeletal:     Right lower leg: Edema present.  Left lower leg: Edema present.     Comments: Trace bilateral lower extremity edema Is able to move all extremities     Lymphadenopathy:     Cervical: No cervical adenopathy.  Skin:    General: Skin is warm and dry.  Neurological:     Mental Status: He is alert and oriented to person, place, and time.  Psychiatric:        Mood and Affect: Mood normal.      ASSESSMENT/ PLAN:  Patient is being discharged with the following home health services:   Pt/ot/rn/cna: to evaluate and treat as indicated for gait balance strength adl training medication management and adl care.   Patient is being discharged with the following durable medical equipment:  None needed   Patient has been advised to f/u with their PCP in 1-2 weeks to bring them up to date on their rehab stay.  Social services at facility was responsible for arranging this appointment.  Pt was provided with a 30 day supply of prescriptions for medications and refills must be obtained from their PCP.  For controlled substances, a more limited supply may be provided adequate until PCP appointment only.   A 30 day supply of prescription medications have been sent to [facility] in [place]. With #10 xanax 0.5 mg tabs.   Time spent with patient: 35 minutes: discussed home health needs; medications; and dme.   Ok Edwards NP [facility]  Contact [phone] Monday through Friday 8am- 5pm  After hours call [phone]

## 2018-08-21 DIAGNOSIS — E1122 Type 2 diabetes mellitus with diabetic chronic kidney disease: Secondary | ICD-10-CM | POA: Diagnosis not present

## 2018-08-21 DIAGNOSIS — I5042 Chronic combined systolic (congestive) and diastolic (congestive) heart failure: Secondary | ICD-10-CM | POA: Diagnosis not present

## 2018-08-21 DIAGNOSIS — E1169 Type 2 diabetes mellitus with other specified complication: Secondary | ICD-10-CM | POA: Diagnosis not present

## 2018-08-21 DIAGNOSIS — N400 Enlarged prostate without lower urinary tract symptoms: Secondary | ICD-10-CM | POA: Diagnosis not present

## 2018-08-21 DIAGNOSIS — I429 Cardiomyopathy, unspecified: Secondary | ICD-10-CM | POA: Diagnosis not present

## 2018-08-21 DIAGNOSIS — Z7982 Long term (current) use of aspirin: Secondary | ICD-10-CM | POA: Diagnosis not present

## 2018-08-21 DIAGNOSIS — G4730 Sleep apnea, unspecified: Secondary | ICD-10-CM | POA: Diagnosis not present

## 2018-08-21 DIAGNOSIS — Z9981 Dependence on supplemental oxygen: Secondary | ICD-10-CM | POA: Diagnosis not present

## 2018-08-21 DIAGNOSIS — I130 Hypertensive heart and chronic kidney disease with heart failure and stage 1 through stage 4 chronic kidney disease, or unspecified chronic kidney disease: Secondary | ICD-10-CM | POA: Diagnosis not present

## 2018-08-21 DIAGNOSIS — I25110 Atherosclerotic heart disease of native coronary artery with unstable angina pectoris: Secondary | ICD-10-CM | POA: Diagnosis not present

## 2018-08-21 DIAGNOSIS — F419 Anxiety disorder, unspecified: Secondary | ICD-10-CM | POA: Diagnosis not present

## 2018-08-21 DIAGNOSIS — Z7902 Long term (current) use of antithrombotics/antiplatelets: Secondary | ICD-10-CM | POA: Diagnosis not present

## 2018-08-21 DIAGNOSIS — N183 Chronic kidney disease, stage 3 (moderate): Secondary | ICD-10-CM | POA: Diagnosis not present

## 2018-08-21 DIAGNOSIS — E785 Hyperlipidemia, unspecified: Secondary | ICD-10-CM | POA: Diagnosis not present

## 2018-08-21 DIAGNOSIS — Z951 Presence of aortocoronary bypass graft: Secondary | ICD-10-CM | POA: Diagnosis not present

## 2018-08-21 DIAGNOSIS — Z794 Long term (current) use of insulin: Secondary | ICD-10-CM | POA: Diagnosis not present

## 2018-08-21 DIAGNOSIS — J449 Chronic obstructive pulmonary disease, unspecified: Secondary | ICD-10-CM | POA: Diagnosis not present

## 2018-08-21 DIAGNOSIS — M1990 Unspecified osteoarthritis, unspecified site: Secondary | ICD-10-CM | POA: Diagnosis not present

## 2018-08-21 DIAGNOSIS — M6281 Muscle weakness (generalized): Secondary | ICD-10-CM | POA: Diagnosis not present

## 2018-08-21 DIAGNOSIS — M109 Gout, unspecified: Secondary | ICD-10-CM | POA: Diagnosis not present

## 2018-08-22 DIAGNOSIS — I130 Hypertensive heart and chronic kidney disease with heart failure and stage 1 through stage 4 chronic kidney disease, or unspecified chronic kidney disease: Secondary | ICD-10-CM | POA: Diagnosis not present

## 2018-08-22 DIAGNOSIS — F419 Anxiety disorder, unspecified: Secondary | ICD-10-CM | POA: Diagnosis not present

## 2018-08-22 DIAGNOSIS — E785 Hyperlipidemia, unspecified: Secondary | ICD-10-CM | POA: Diagnosis not present

## 2018-08-22 DIAGNOSIS — I5042 Chronic combined systolic (congestive) and diastolic (congestive) heart failure: Secondary | ICD-10-CM | POA: Diagnosis not present

## 2018-08-22 DIAGNOSIS — G4730 Sleep apnea, unspecified: Secondary | ICD-10-CM | POA: Diagnosis not present

## 2018-08-22 DIAGNOSIS — Z7902 Long term (current) use of antithrombotics/antiplatelets: Secondary | ICD-10-CM | POA: Diagnosis not present

## 2018-08-22 DIAGNOSIS — N400 Enlarged prostate without lower urinary tract symptoms: Secondary | ICD-10-CM | POA: Diagnosis not present

## 2018-08-22 DIAGNOSIS — M109 Gout, unspecified: Secondary | ICD-10-CM | POA: Diagnosis not present

## 2018-08-22 DIAGNOSIS — I429 Cardiomyopathy, unspecified: Secondary | ICD-10-CM | POA: Diagnosis not present

## 2018-08-22 DIAGNOSIS — M6281 Muscle weakness (generalized): Secondary | ICD-10-CM | POA: Diagnosis not present

## 2018-08-22 DIAGNOSIS — E1169 Type 2 diabetes mellitus with other specified complication: Secondary | ICD-10-CM | POA: Diagnosis not present

## 2018-08-22 DIAGNOSIS — Z794 Long term (current) use of insulin: Secondary | ICD-10-CM | POA: Diagnosis not present

## 2018-08-22 DIAGNOSIS — E1122 Type 2 diabetes mellitus with diabetic chronic kidney disease: Secondary | ICD-10-CM | POA: Diagnosis not present

## 2018-08-22 DIAGNOSIS — M1990 Unspecified osteoarthritis, unspecified site: Secondary | ICD-10-CM | POA: Diagnosis not present

## 2018-08-22 DIAGNOSIS — Z7982 Long term (current) use of aspirin: Secondary | ICD-10-CM | POA: Diagnosis not present

## 2018-08-22 DIAGNOSIS — Z9981 Dependence on supplemental oxygen: Secondary | ICD-10-CM | POA: Diagnosis not present

## 2018-08-22 DIAGNOSIS — I25110 Atherosclerotic heart disease of native coronary artery with unstable angina pectoris: Secondary | ICD-10-CM | POA: Diagnosis not present

## 2018-08-22 DIAGNOSIS — N183 Chronic kidney disease, stage 3 (moderate): Secondary | ICD-10-CM | POA: Diagnosis not present

## 2018-08-22 DIAGNOSIS — Z951 Presence of aortocoronary bypass graft: Secondary | ICD-10-CM | POA: Diagnosis not present

## 2018-08-22 DIAGNOSIS — J449 Chronic obstructive pulmonary disease, unspecified: Secondary | ICD-10-CM | POA: Diagnosis not present

## 2018-09-06 NOTE — Progress Notes (Signed)
Virtual Visit via Telephone Note   This visit type was conducted due to national recommendations for restrictions regarding the COVID-19 Pandemic (e.g. social distancing) in an effort to limit this patient's exposure and mitigate transmission in our community.  Due to his co-morbid illnesses, this patient is at least at moderate risk for complications without adequate follow up.  This format is felt to be most appropriate for this patient at this time.  The patient did not have access to video technology/had technical difficulties with video requiring transitioning to audio format only (telephone).  All issues noted in this document were discussed and addressed.  No physical exam could be performed with this format.  Please refer to the patient's chart for his  consent to telehealth for [facility].   Date:  09/07/2018   ID:  [name], DOB 13-Dec-1933, MRN [id number]  Patient Location: Home Provider Location: Home  PCP:  [name], MD  Cardiologist:  [name], MD Electrophysiologist:  None   Evaluation Performed:  Follow-Up Visit  Chief Complaint:   Cardiac follow-up  History of Present Illness:    [name] is an 85 y.o. male last evaluated via telehealth encounter by Ms. Vita Barley in April.  Patient did not have video access and we spoke by phone today.  I also talked with his wife.  I reviewed his records and last hospital stay, he was discharged from the [facility] earlier in May and has had home health nursing and physical therapy.  He does not report any active angina symptoms on medical therapy.  He does feel like his stamina has increased with therapy at home.  Also reports good control of fluid weight and edema.  I went over his medications in detail, outlined below.  He reports no intolerances at this time.  Follow-up lab work in late April showed BUN and creatinine 66 and 1.98 respectively, decreased overall.  The patient does not have symptoms  concerning for COVID-19 infection (fever, chills, cough, or new shortness of breath).    Past Medical History:  Diagnosis Date  . Allergic rhinitis   . Anxiety   . Arthritis   . COPD (chronic obstructive pulmonary disease) ([place])   . Coronary atherosclerosis of native coronary artery    a. CABG x 4 in 1994. Previous stent placement to SVG to distal RCA;  b. DES to native Cx in 2006;  c. DES SVG to RCA 05/08/11; d. Inf MI/Cath/PCI: LM 25, LAD 100, LCX patent stent, 50 into OM, RI 60-70, RCA 100, LIMA->LAD nl, VG->Diag 100 old, VG->RCA/PL 99 (DES x 1), EF 50-55%.  . Depression    With anxious features/hx panic attacks  . Essential hypertension, benign   . GERD (gastroesophageal reflux disease)   . Hyperlipidemia    Not on statin 2/2 hx of intolerance.   . Morbid obesity ([place])   . Nephrolithiasis   . Pneumonia   . Sleep apnea   . ST elevation myocardial infarction (STEMI) of inferior wall ([place])    4/14 - DES SVG to PDA  . Type 2 diabetes mellitus ([place])    Past Surgical History:  Procedure Laterality Date  . CORONARY ARTERY BYPASS GRAFT  1994  . EYE SURGERY    . LEFT HEART CATHETERIZATION WITH CORONARY ANGIOGRAM N/A 05/08/2011   Procedure: LEFT HEART CATHETERIZATION WITH CORONARY ANGIOGRAM;  Surgeon: [name], MD;  Location: [facility] CATH LAB;  Service: Cardiovascular;  Laterality: N/A;  . LEFT HEART CATHETERIZATION  WITH CORONARY ANGIOGRAM N/A 08/08/2012   Procedure: LEFT HEART CATHETERIZATION WITH CORONARY ANGIOGRAM;  Surgeon: [name], MD;  Location: [facility] CATH LAB;  Service: Cardiovascular;  Laterality: N/A;  . PERCUTANEOUS CORONARY STENT INTERVENTION (PCI-S) Right 05/08/2011   Procedure: PERCUTANEOUS CORONARY STENT INTERVENTION (PCI-S);  Surgeon: [name], MD;  Location: [facility] CATH LAB;  Service: Cardiovascular;  Laterality: Right;  . PERCUTANEOUS CORONARY STENT INTERVENTION (PCI-S) N/A 08/08/2012   Procedure: PERCUTANEOUS CORONARY STENT INTERVENTION (PCI-S);  Surgeon:  [name], MD;  Location: [facility] CATH LAB;  Service: Cardiovascular;  Laterality: N/A;     Current Meds  Medication Sig  . acetaminophen (TYLENOL) 325 MG tablet Take 650 mg by mouth every 4 (four) hours as needed.  Marland Kitchen allopurinol (ZYLOPRIM) 100 MG tablet Take 2 tablets (200 mg total) by mouth daily.  Marland Kitchen ALPRAZolam (XANAX) 0.5 MG tablet Take 1 tablet (0.5 mg total) by mouth at bedtime.  Marland Kitchen aspirin EC 81 MG tablet Take 81 mg by mouth every morning.  . clopidogrel (PLAVIX) 75 MG tablet take 1 tablet by mouth once daily WITH BREAKFAST  . hydrALAZINE (APRESOLINE) 10 MG tablet Take 1 tablet (10 mg total) by mouth every 8 (eight) hours.  . insulin aspart (NOVOLOG) 100 UNIT/ML injection Inject 7 Units into the skin 3 (three) times daily with meals. Give only if eats 50% or more of meal.  . Insulin Glargine, 2 Unit Dial, (TOUJEO MAX SOLOSTAR) 300 UNIT/ML SOPN Inject 60 Units into the skin at bedtime.  . isosorbide mononitrate (IMDUR) 30 MG 24 hr tablet Take 1 tablet (30 mg total) by mouth 2 (two) times daily.  . metoprolol tartrate (LOPRESSOR) 50 MG tablet Take 1 tablet (50 mg total) by mouth 2 (two) times daily.  . nitroGLYCERIN (NITROLINGUAL) 0.4 MG/SPRAY spray Place 1 spray under the tongue every 5 (five) minutes x 3 doses as needed for chest pain.  . NON FORMULARY Diet Type: NAS, Consistent Carbohydrate  . ondansetron (ZOFRAN) 4 MG tablet Take 1 tablet (4 mg total) by mouth every 8 (eight) hours as needed for nausea or vomiting.  . OXYGEN Inhale 2 L/min into the lungs continuous.  . polyethylene glycol powder (GLYCOLAX/MIRALAX) powder Take 17 g by mouth daily as needed (constipation).   . psyllium (METAMUCIL) 58.6 % powder Take 2 tsp by mouth: Mix with 8 ounces liquid and drink once a day  . tamsulosin (FLOMAX) 0.4 MG CAPS capsule Take 1 capsule (0.4 mg total) by mouth every evening.  Marland Kitchen UNABLE TO FIND CPAP from home while sleeping, at previous home settings     Allergies:   Zocor  [simvastatin] and Lipitor [atorvastatin]   Social History   Tobacco Use  . Smoking status: Never Smoker  . Smokeless tobacco: Never Used  Substance Use Topics  . Alcohol use: No    Alcohol/week: 0.0 standard drinks  . Drug use: No     Family Hx: The patient's family history includes Heart attack in his mother.  ROS:   Please see the history of present illness.    Chronic hearing loss. All other systems reviewed and are negative.   Prior CV studies:   The following studies were reviewed today:  Echocardiogram 07/21/2018:  1. Images are limited.  2. The left ventricle had a visually estimated ejection fraction of approximately 40-45%. Unable to assess focal wall motion. The cavity size was normal. There is moderately increased left ventricular wall thickness. Left ventricular diastolic Doppler  parameters are consistent with impaired relaxation.  3. The cavity was mildly enlarged.  4. The aortic valve is tricuspid. Moderate calcification of the aortic valve. Mild to moderate aortic annular calcification noted. Possible mild aortic stenosis.  5. The mitral valve is grossly normal. There is mild to moderate mitral annular calcification present.  6. The tricuspid valve was grossly normal.  7. The aortic root is normal in size and structure.  Labs/Other Tests and Data Reviewed:    EKG:  An ECG dated 07/22/2018 was personally reviewed today and demonstrated:  Sinus tachycardia with prolonged PR interval, PAC, R' in lead V1, old inferior infarct pattern.  Recent Labs: 07/21/2018: B Natriuretic Peptide 126.0 07/29/2018: Hemoglobin 15.0; Platelets 282 08/03/2018: BUN 66; Creatinine, Ser 1.98; Potassium 3.9; Sodium 137   Recent Lipid Panel Lab Results  Component Value Date/Time   CHOL 185 05/25/2014 05:49 AM   TRIG 92 05/25/2014 05:49 AM   HDL 44 05/25/2014 05:49 AM   CHOLHDL 4.2 05/25/2014 05:49 AM   LDLCALC 123 (H) 05/25/2014 05:49 AM    Wt Readings from Last 3 Encounters:   09/07/18 264 lb (119.7 kg)  08/17/18 266 lb (120.7 kg)  08/12/18 267 lb 8 oz (121.3 kg)     Objective:    Vital Signs:  Ht 5\' 5"  (1.651 m)   Wt 264 lb (119.7 kg)   BMI 43.93 kg/m    Patient spoke in full sentences, not short of breath No audible wheezing. Speech pattern normal.  ASSESSMENT & PLAN:    1.  Multivessel CAD status post CABG with documented graft disease and subsequent PCI.  He was managed medically for unstable angina with demand ischemia back in April, no active symptoms at this time on medical therapy.  2.  Ischemic cardiomyopathy with LVEF 40 to 45%.  Weight is been stable in terms of fluid status, no progressive leg edema on Demadex.  Lopressor, hydralazine/nitrate.  3.  CKD stage III, recent creatinine 1.98.  4.  Essential hypertension.  COVID-19 Education: The signs and symptoms of COVID-19 were discussed with the patient and how to seek care for testing (follow up with PCP or arrange E-visit).  The importance of social distancing was discussed today.  Time:   Today, I have spent 8 minutes with the patient with telehealth technology discussing the above problems.     Medication Adjustments/Labs and Tests Ordered: Current medicines are reviewed at length with the patient today.  Concerns regarding medicines are outlined above.   Tests Ordered: No orders of the defined types were placed in this encounter.   Medication Changes: No orders of the defined types were placed in this encounter.   Disposition:  Follow up 2 months in the [place] office.  Signed, [name], MD  09/07/2018 11:26 AM    [place]

## 2018-09-07 ENCOUNTER — Other Ambulatory Visit: Payer: Self-pay

## 2018-09-07 ENCOUNTER — Encounter: Payer: Self-pay | Admitting: Cardiology

## 2018-09-07 ENCOUNTER — Telehealth (INDEPENDENT_AMBULATORY_CARE_PROVIDER_SITE_OTHER): Payer: PPO | Admitting: Cardiology

## 2018-09-07 VITALS — Ht 65.0 in | Wt 264.0 lb

## 2018-09-07 DIAGNOSIS — N183 Chronic kidney disease, stage 3 unspecified: Secondary | ICD-10-CM

## 2018-09-07 DIAGNOSIS — I25119 Atherosclerotic heart disease of native coronary artery with unspecified angina pectoris: Secondary | ICD-10-CM | POA: Diagnosis not present

## 2018-09-07 DIAGNOSIS — Z7189 Other specified counseling: Secondary | ICD-10-CM | POA: Diagnosis not present

## 2018-09-07 DIAGNOSIS — I10 Essential (primary) hypertension: Secondary | ICD-10-CM

## 2018-09-07 DIAGNOSIS — I255 Ischemic cardiomyopathy: Secondary | ICD-10-CM | POA: Diagnosis not present

## 2018-09-07 DIAGNOSIS — I429 Cardiomyopathy, unspecified: Secondary | ICD-10-CM

## 2018-09-07 NOTE — Patient Instructions (Signed)
Medication Instructions: Your physician recommends that you continue on your current medications as directed. Please refer to the Current Medication list given to you today.   Labwork: None today  Procedures/Testing: None today  Follow-Up: 2-3 months with Dr.McDowell in the [place] office  Any Additional Special Instructions Will Be Listed Below (If Applicable).     If you need a refill on your cardiac medications before your next appointment, please call your pharmacy.     Thank you for choosing [facility] !

## 2018-09-12 ENCOUNTER — Ambulatory Visit: Payer: PPO | Admitting: Cardiology

## 2018-09-19 DIAGNOSIS — I10 Essential (primary) hypertension: Secondary | ICD-10-CM | POA: Diagnosis not present

## 2018-09-19 DIAGNOSIS — N183 Chronic kidney disease, stage 3 (moderate): Secondary | ICD-10-CM | POA: Diagnosis not present

## 2018-09-19 DIAGNOSIS — I5032 Chronic diastolic (congestive) heart failure: Secondary | ICD-10-CM | POA: Diagnosis not present

## 2018-09-19 DIAGNOSIS — F419 Anxiety disorder, unspecified: Secondary | ICD-10-CM | POA: Diagnosis not present

## 2018-09-19 DIAGNOSIS — E782 Mixed hyperlipidemia: Secondary | ICD-10-CM | POA: Diagnosis not present

## 2018-09-19 DIAGNOSIS — E1165 Type 2 diabetes mellitus with hyperglycemia: Secondary | ICD-10-CM | POA: Diagnosis not present

## 2018-09-19 DIAGNOSIS — R609 Edema, unspecified: Secondary | ICD-10-CM | POA: Diagnosis not present

## 2018-09-19 DIAGNOSIS — G4733 Obstructive sleep apnea (adult) (pediatric): Secondary | ICD-10-CM | POA: Diagnosis not present

## 2018-09-20 DIAGNOSIS — R0902 Hypoxemia: Secondary | ICD-10-CM | POA: Diagnosis not present

## 2018-09-20 DIAGNOSIS — G4733 Obstructive sleep apnea (adult) (pediatric): Secondary | ICD-10-CM | POA: Diagnosis not present

## 2018-10-11 ENCOUNTER — Other Ambulatory Visit: Payer: Self-pay | Admitting: Adult Health

## 2018-10-11 DIAGNOSIS — I10 Essential (primary) hypertension: Secondary | ICD-10-CM | POA: Diagnosis not present

## 2018-10-11 DIAGNOSIS — E1165 Type 2 diabetes mellitus with hyperglycemia: Secondary | ICD-10-CM | POA: Diagnosis not present

## 2018-10-11 DIAGNOSIS — I5032 Chronic diastolic (congestive) heart failure: Secondary | ICD-10-CM | POA: Diagnosis not present

## 2018-10-17 ENCOUNTER — Other Ambulatory Visit: Payer: Self-pay | Admitting: Adult Health

## 2018-10-17 DIAGNOSIS — I130 Hypertensive heart and chronic kidney disease with heart failure and stage 1 through stage 4 chronic kidney disease, or unspecified chronic kidney disease: Secondary | ICD-10-CM | POA: Diagnosis not present

## 2018-10-17 DIAGNOSIS — I5042 Chronic combined systolic (congestive) and diastolic (congestive) heart failure: Secondary | ICD-10-CM | POA: Diagnosis not present

## 2018-10-17 DIAGNOSIS — I25110 Atherosclerotic heart disease of native coronary artery with unstable angina pectoris: Secondary | ICD-10-CM | POA: Diagnosis not present

## 2018-10-17 DIAGNOSIS — J449 Chronic obstructive pulmonary disease, unspecified: Secondary | ICD-10-CM | POA: Diagnosis not present

## 2018-10-17 DIAGNOSIS — I429 Cardiomyopathy, unspecified: Secondary | ICD-10-CM | POA: Diagnosis not present

## 2018-10-18 ENCOUNTER — Other Ambulatory Visit: Payer: Self-pay | Admitting: Cardiology

## 2018-10-20 DIAGNOSIS — G4733 Obstructive sleep apnea (adult) (pediatric): Secondary | ICD-10-CM | POA: Diagnosis not present

## 2018-10-20 DIAGNOSIS — R0902 Hypoxemia: Secondary | ICD-10-CM | POA: Diagnosis not present

## 2018-10-22 ENCOUNTER — Emergency Department (HOSPITAL_COMMUNITY): Payer: PPO

## 2018-10-22 ENCOUNTER — Other Ambulatory Visit: Payer: Self-pay

## 2018-10-22 ENCOUNTER — Encounter (HOSPITAL_COMMUNITY): Payer: Self-pay | Admitting: Emergency Medicine

## 2018-10-22 ENCOUNTER — Inpatient Hospital Stay (HOSPITAL_COMMUNITY)
Admission: EM | Admit: 2018-10-22 | Discharge: 2018-10-25 | DRG: 194 | Disposition: A | Discharge status: Home / Self Care | Payer: PPO | Attending: Internal Medicine | Admitting: Internal Medicine

## 2018-10-22 DIAGNOSIS — I5042 Chronic combined systolic (congestive) and diastolic (congestive) heart failure: Secondary | ICD-10-CM | POA: Diagnosis not present

## 2018-10-22 DIAGNOSIS — Z951 Presence of aortocoronary bypass graft: Secondary | ICD-10-CM | POA: Diagnosis not present

## 2018-10-22 DIAGNOSIS — N183 Chronic kidney disease, stage 3 unspecified: Secondary | ICD-10-CM

## 2018-10-22 DIAGNOSIS — G4733 Obstructive sleep apnea (adult) (pediatric): Secondary | ICD-10-CM | POA: Diagnosis present

## 2018-10-22 DIAGNOSIS — Z7902 Long term (current) use of antithrombotics/antiplatelets: Secondary | ICD-10-CM

## 2018-10-22 DIAGNOSIS — J189 Pneumonia, unspecified organism: Secondary | ICD-10-CM | POA: Diagnosis not present

## 2018-10-22 DIAGNOSIS — M7989 Other specified soft tissue disorders: Secondary | ICD-10-CM

## 2018-10-22 DIAGNOSIS — Z955 Presence of coronary angioplasty implant and graft: Secondary | ICD-10-CM | POA: Diagnosis not present

## 2018-10-22 DIAGNOSIS — I252 Old myocardial infarction: Secondary | ICD-10-CM

## 2018-10-22 DIAGNOSIS — R600 Localized edema: Secondary | ICD-10-CM

## 2018-10-22 DIAGNOSIS — J9611 Chronic respiratory failure with hypoxia: Secondary | ICD-10-CM | POA: Diagnosis present

## 2018-10-22 DIAGNOSIS — Z9981 Dependence on supplemental oxygen: Secondary | ICD-10-CM

## 2018-10-22 DIAGNOSIS — E1165 Type 2 diabetes mellitus with hyperglycemia: Secondary | ICD-10-CM | POA: Diagnosis not present

## 2018-10-22 DIAGNOSIS — R0602 Shortness of breath: Secondary | ICD-10-CM | POA: Diagnosis not present

## 2018-10-22 DIAGNOSIS — I5043 Acute on chronic combined systolic (congestive) and diastolic (congestive) heart failure: Secondary | ICD-10-CM | POA: Diagnosis not present

## 2018-10-22 DIAGNOSIS — I2510 Atherosclerotic heart disease of native coronary artery without angina pectoris: Secondary | ICD-10-CM | POA: Diagnosis present

## 2018-10-22 DIAGNOSIS — F329 Major depressive disorder, single episode, unspecified: Secondary | ICD-10-CM | POA: Diagnosis not present

## 2018-10-22 DIAGNOSIS — E1122 Type 2 diabetes mellitus with diabetic chronic kidney disease: Secondary | ICD-10-CM | POA: Diagnosis not present

## 2018-10-22 DIAGNOSIS — N179 Acute kidney failure, unspecified: Secondary | ICD-10-CM | POA: Diagnosis not present

## 2018-10-22 DIAGNOSIS — I130 Hypertensive heart and chronic kidney disease with heart failure and stage 1 through stage 4 chronic kidney disease, or unspecified chronic kidney disease: Secondary | ICD-10-CM | POA: Diagnosis not present

## 2018-10-22 DIAGNOSIS — J181 Lobar pneumonia, unspecified organism: Secondary | ICD-10-CM

## 2018-10-22 DIAGNOSIS — J440 Chronic obstructive pulmonary disease with acute lower respiratory infection: Secondary | ICD-10-CM | POA: Diagnosis not present

## 2018-10-22 DIAGNOSIS — R0902 Hypoxemia: Secondary | ICD-10-CM | POA: Diagnosis not present

## 2018-10-22 DIAGNOSIS — L89312 Pressure ulcer of right buttock, stage 2: Secondary | ICD-10-CM | POA: Diagnosis present

## 2018-10-22 DIAGNOSIS — R778 Other specified abnormalities of plasma proteins: Secondary | ICD-10-CM

## 2018-10-22 DIAGNOSIS — S91202A Unspecified open wound of left great toe with damage to nail, initial encounter: Secondary | ICD-10-CM | POA: Diagnosis present

## 2018-10-22 DIAGNOSIS — X58XXXA Exposure to other specified factors, initial encounter: Secondary | ICD-10-CM | POA: Diagnosis present

## 2018-10-22 DIAGNOSIS — F419 Anxiety disorder, unspecified: Secondary | ICD-10-CM | POA: Diagnosis present

## 2018-10-22 DIAGNOSIS — Z6841 Body Mass Index (BMI) 40.0 and over, adult: Secondary | ICD-10-CM

## 2018-10-22 DIAGNOSIS — Z7982 Long term (current) use of aspirin: Secondary | ICD-10-CM | POA: Diagnosis not present

## 2018-10-22 DIAGNOSIS — R7989 Other specified abnormal findings of blood chemistry: Secondary | ICD-10-CM | POA: Diagnosis not present

## 2018-10-22 DIAGNOSIS — Z794 Long term (current) use of insulin: Secondary | ICD-10-CM | POA: Diagnosis not present

## 2018-10-22 DIAGNOSIS — Z20828 Contact with and (suspected) exposure to other viral communicable diseases: Secondary | ICD-10-CM | POA: Diagnosis present

## 2018-10-22 DIAGNOSIS — E1121 Type 2 diabetes mellitus with diabetic nephropathy: Secondary | ICD-10-CM | POA: Diagnosis not present

## 2018-10-22 DIAGNOSIS — Z8249 Family history of ischemic heart disease and other diseases of the circulatory system: Secondary | ICD-10-CM

## 2018-10-22 DIAGNOSIS — Z66 Do not resuscitate: Secondary | ICD-10-CM | POA: Diagnosis not present

## 2018-10-22 DIAGNOSIS — Y95 Nosocomial condition: Secondary | ICD-10-CM | POA: Diagnosis present

## 2018-10-22 DIAGNOSIS — L8990 Pressure ulcer of unspecified site, unspecified stage: Secondary | ICD-10-CM | POA: Insufficient documentation

## 2018-10-22 LAB — COMPREHENSIVE METABOLIC PANEL
ALT: 12 U/L (ref 0–44)
AST: 14 U/L — ABNORMAL LOW (ref 15–41)
Albumin: 4 g/dL (ref 3.5–5.0)
Alkaline Phosphatase: 63 U/L (ref 38–126)
Anion gap: 11 (ref 5–15)
BUN: 111 mg/dL — ABNORMAL HIGH (ref 8–23)
CO2: 28 mmol/L (ref 22–32)
Calcium: 9.3 mg/dL (ref 8.9–10.3)
Chloride: 93 mmol/L — ABNORMAL LOW (ref 98–111)
Creatinine, Ser: 2.64 mg/dL — ABNORMAL HIGH (ref 0.61–1.24)
GFR calc Af Amer: 25 mL/min — ABNORMAL LOW (ref >60)
GFR calc non Af Amer: 21 mL/min — ABNORMAL LOW (ref >60)
Glucose, Bld: 405 mg/dL — ABNORMAL HIGH (ref 70–99)
Potassium: 3.7 mmol/L (ref 3.5–5.1)
Sodium: 132 mmol/L — ABNORMAL LOW (ref 135–145)
Total Bilirubin: 0.4 mg/dL (ref 0.3–1.2)
Total Protein: 7.3 g/dL (ref 6.5–8.1)

## 2018-10-22 LAB — CBC
HCT: 38.1 % — ABNORMAL LOW (ref 39.0–52.0)
Hemoglobin: 12.2 g/dL — ABNORMAL LOW (ref 13.0–17.0)
MCH: 28.8 pg (ref 26.0–34.0)
MCHC: 32 g/dL (ref 30.0–36.0)
MCV: 90.1 fL (ref 80.0–100.0)
Platelets: 183 10*3/uL (ref 150–400)
RBC: 4.23 MIL/uL (ref 4.22–5.81)
RDW: 15.1 % (ref 11.5–15.5)
WBC: 7.4 10*3/uL (ref 4.0–10.5)
nRBC: 0 % (ref 0.0–0.2)

## 2018-10-22 LAB — SARS CORONAVIRUS 2 BY RT PCR (HOSPITAL ORDER, PERFORMED IN ~~LOC~~ HOSPITAL LAB): SARS Coronavirus 2: NEGATIVE

## 2018-10-22 LAB — GLUCOSE, CAPILLARY: Glucose-Capillary: 290 mg/dL — ABNORMAL HIGH (ref 70–99)

## 2018-10-22 LAB — TROPONIN I (HIGH SENSITIVITY)
Troponin I (High Sensitivity): 32 ng/L — ABNORMAL HIGH (ref <18)
Troponin I (High Sensitivity): 36 ng/L — ABNORMAL HIGH (ref <18)

## 2018-10-22 LAB — LACTIC ACID, PLASMA: Lactic Acid, Venous: 1.7 mmol/L (ref 0.5–1.9)

## 2018-10-22 LAB — MRSA PCR SCREENING: MRSA by PCR: NEGATIVE

## 2018-10-22 LAB — BRAIN NATRIURETIC PEPTIDE: B Natriuretic Peptide: 83 pg/mL (ref 0.0–100.0)

## 2018-10-22 MED ORDER — ALPRAZOLAM 1 MG PO TABS: 1.0000 mg | ORAL_TABLET | Freq: Every day | ORAL | Status: DC

## 2018-10-22 MED ORDER — ISOSORBIDE MONONITRATE ER 60 MG PO TB24
30.0000 mg | ORAL_TABLET | Freq: Two times a day (BID) | ORAL | Status: DC
  Administered 2018-10-22 – 2018-10-25 (×6): 30 mg via ORAL
  Filled 2018-10-22 (×6): qty 1

## 2018-10-22 MED ORDER — VANCOMYCIN HCL 10 G IV SOLR
1500.0000 mg | INTRAVENOUS | Status: DC
  Filled 2018-10-22: qty 1500

## 2018-10-22 MED ORDER — SODIUM CHLORIDE 0.9 % IV SOLN
1.0000 g | Freq: Once | INTRAVENOUS | Status: AC
  Administered 2018-10-22: 1 g via INTRAVENOUS
  Filled 2018-10-22: qty 1

## 2018-10-22 MED ORDER — POLYETHYLENE GLYCOL 3350 17 G PO PACK: 17.0000 g | PACK | Freq: Every day | ORAL | Status: DC | PRN

## 2018-10-22 MED ORDER — DOUBLE ANTIBIOTIC 500-10000 UNIT/GM EX OINT
TOPICAL_OINTMENT | CUTANEOUS | Status: AC
  Administered 2018-10-22: 16:00:00
  Filled 2018-10-22: qty 1

## 2018-10-22 MED ORDER — INSULIN ASPART 100 UNIT/ML ~~LOC~~ SOLN
10.0000 [IU] | Freq: Once | SUBCUTANEOUS | Status: AC
  Administered 2018-10-22: 10 [IU] via INTRAVENOUS
  Filled 2018-10-22: qty 1

## 2018-10-22 MED ORDER — CHLORHEXIDINE GLUCONATE 0.12 % MT SOLN
15.0000 mL | Freq: Two times a day (BID) | OROMUCOSAL | Status: DC
  Administered 2018-10-22 – 2018-10-25 (×6): 15 mL via OROMUCOSAL
  Filled 2018-10-22 (×6): qty 15

## 2018-10-22 MED ORDER — ENOXAPARIN SODIUM 30 MG/0.3ML ~~LOC~~ SOLN
30.0000 mg | SUBCUTANEOUS | Status: DC
  Administered 2018-10-22 – 2018-10-24 (×3): 30 mg via SUBCUTANEOUS
  Filled 2018-10-22 (×4): qty 0.3

## 2018-10-22 MED ORDER — HYDROMORPHONE HCL 1 MG/ML IJ SOLN
0.5000 mg | Freq: Once | INTRAMUSCULAR | Status: AC
  Administered 2018-10-22: 0.5 mg via INTRAVENOUS
  Filled 2018-10-22: qty 1

## 2018-10-22 MED ORDER — ONDANSETRON HCL 4 MG/2ML IJ SOLN
4.0000 mg | Freq: Once | INTRAMUSCULAR | Status: AC
  Administered 2018-10-22: 4 mg via INTRAVENOUS
  Filled 2018-10-22: qty 2

## 2018-10-22 MED ORDER — ALPRAZOLAM 1 MG PO TABS
2.0000 mg | ORAL_TABLET | Freq: Every day | ORAL | Status: DC
  Administered 2018-10-22 – 2018-10-24 (×3): 2 mg via ORAL
  Filled 2018-10-22 (×3): qty 2

## 2018-10-22 MED ORDER — ACETAMINOPHEN 325 MG PO TABS
650.0000 mg | ORAL_TABLET | Freq: Four times a day (QID) | ORAL | Status: DC | PRN
  Administered 2018-10-24: 650 mg via ORAL
  Filled 2018-10-22: qty 2

## 2018-10-22 MED ORDER — NITROGLYCERIN 0.4 MG/SPRAY TL SOLN
1.0000 | Status: DC | PRN
  Filled 2018-10-22: qty 4.9

## 2018-10-22 MED ORDER — METOPROLOL TARTRATE 50 MG PO TABS
50.0000 mg | ORAL_TABLET | Freq: Two times a day (BID) | ORAL | Status: DC
  Administered 2018-10-22 – 2018-10-25 (×6): 50 mg via ORAL
  Filled 2018-10-22 (×7): qty 1

## 2018-10-22 MED ORDER — TAMSULOSIN HCL 0.4 MG PO CAPS
0.4000 mg | ORAL_CAPSULE | Freq: Every evening | ORAL | Status: DC
  Administered 2018-10-22 – 2018-10-24 (×3): 0.4 mg via ORAL
  Filled 2018-10-22 (×4): qty 1

## 2018-10-22 MED ORDER — INSULIN ASPART 100 UNIT/ML ~~LOC~~ SOLN
0.0000 [IU] | Freq: Three times a day (TID) | SUBCUTANEOUS | Status: DC
  Administered 2018-10-23: 11 [IU] via SUBCUTANEOUS
  Administered 2018-10-23: 5 [IU] via SUBCUTANEOUS
  Administered 2018-10-23: 8 [IU] via SUBCUTANEOUS
  Administered 2018-10-24: 5 [IU] via SUBCUTANEOUS
  Administered 2018-10-24: 12:00:00 8 [IU] via SUBCUTANEOUS
  Administered 2018-10-24 – 2018-10-25 (×2): 3 [IU] via SUBCUTANEOUS
  Administered 2018-10-25: 8 [IU] via SUBCUTANEOUS

## 2018-10-22 MED ORDER — ONDANSETRON HCL 4 MG/2ML IJ SOLN: 4.0000 mg | Freq: Four times a day (QID) | INTRAMUSCULAR | Status: DC | PRN

## 2018-10-22 MED ORDER — HYDROCODONE-ACETAMINOPHEN 5-325 MG PO TABS
1.0000 | ORAL_TABLET | ORAL | Status: DC | PRN
  Administered 2018-10-22 – 2018-10-24 (×6): 1 via ORAL
  Filled 2018-10-22 (×6): qty 1

## 2018-10-22 MED ORDER — ALLOPURINOL 100 MG PO TABS
200.0000 mg | ORAL_TABLET | Freq: Every day | ORAL | Status: DC
  Administered 2018-10-23 – 2018-10-25 (×3): 200 mg via ORAL
  Filled 2018-10-22 (×3): qty 2

## 2018-10-22 MED ORDER — ONDANSETRON HCL 4 MG PO TABS: 4.0000 mg | ORAL_TABLET | Freq: Four times a day (QID) | ORAL | Status: DC | PRN

## 2018-10-22 MED ORDER — INSULIN GLARGINE 100 UNIT/ML ~~LOC~~ SOLN
60.0000 [IU] | Freq: Every day | SUBCUTANEOUS | Status: DC
  Administered 2018-10-22 – 2018-10-24 (×3): 60 [IU] via SUBCUTANEOUS
  Filled 2018-10-22 (×4): qty 0.6

## 2018-10-22 MED ORDER — SODIUM CHLORIDE 0.9 % IV SOLN
1.0000 g | Freq: Two times a day (BID) | INTRAVENOUS | Status: DC
  Administered 2018-10-23 (×3): 1 g via INTRAVENOUS
  Filled 2018-10-22 (×4): qty 1

## 2018-10-22 MED ORDER — VANCOMYCIN HCL IN DEXTROSE 1-5 GM/200ML-% IV SOLN
1000.0000 mg | INTRAVENOUS | Status: AC
  Administered 2018-10-22 (×2): 1000 mg via INTRAVENOUS
  Filled 2018-10-22 (×2): qty 200

## 2018-10-22 MED ORDER — ACETAMINOPHEN 650 MG RE SUPP: 650.0000 mg | Freq: Four times a day (QID) | RECTAL | Status: DC | PRN

## 2018-10-22 MED ORDER — CLOPIDOGREL BISULFATE 75 MG PO TABS
75.0000 mg | ORAL_TABLET | Freq: Every day | ORAL | Status: DC
  Administered 2018-10-23 – 2018-10-25 (×3): 75 mg via ORAL
  Filled 2018-10-22 (×3): qty 1

## 2018-10-22 MED ORDER — ASPIRIN EC 81 MG PO TBEC
81.0000 mg | DELAYED_RELEASE_TABLET | Freq: Every morning | ORAL | Status: DC
  Administered 2018-10-23 – 2018-10-25 (×3): 81 mg via ORAL
  Filled 2018-10-22 (×3): qty 1

## 2018-10-22 MED ORDER — SODIUM CHLORIDE 0.9 % IV SOLN
500.0000 mg | INTRAVENOUS | Status: DC
  Administered 2018-10-22 – 2018-10-23 (×2): 500 mg via INTRAVENOUS
  Filled 2018-10-22 (×2): qty 500

## 2018-10-22 MED ORDER — ORAL CARE MOUTH RINSE
15.0000 mL | Freq: Two times a day (BID) | OROMUCOSAL | Status: DC
  Administered 2018-10-23 – 2018-10-25 (×6): 15 mL via OROMUCOSAL

## 2018-10-22 MED ORDER — NITROGLYCERIN 0.4 MG SL SUBL: 0.4000 mg | SUBLINGUAL_TABLET | SUBLINGUAL | Status: DC | PRN

## 2018-10-22 NOTE — Progress Notes (Signed)
Pharmacy Antibiotic Note  [name] is a 85 y.o. male admitted on 10/22/2018 with pneumonia.  Pharmacy has been consulted for vancomycin dosing.  Plan: Cefepime 1gm iv q12h   Height: 5\' 5"  (165.1 cm) Weight: 245 lb 13 oz (111.5 kg) IBW/kg (Calculated) : 61.5  Temp (24hrs), Avg:97.8 F (36.6 C), Min:97.3 F (36.3 C), Max:98.3 F (36.8 C)  Recent Labs  Lab 10/22/18 1125 10/22/18 1432  WBC 7.4  --   CREATININE 2.64*  --   LATICACIDVEN  --  1.7    Estimated Creatinine Clearance: 24.4 mL/min (A) (by C-G formula based on SCr of 2.64 mg/dL (H)).    Allergies  Allergen Reactions  . Zocor [Simvastatin] Other (See Comments)    fatigue  . Lipitor [Atorvastatin] Other (See Comments)    Muscle cramping    Antimicrobials this admission: 7/11 vancomycin >> 7/11 7/11 cefepime >  Microbiology results: 7/11 BCx: sent  7/11 MRSA PCR: negative  7/11 Covid 19 sent   Thank you for allowing pharmacy to be a part of this patient's care.  Donna Christen Jillana Selph 10/22/2018 6:55 PM

## 2018-10-22 NOTE — Progress Notes (Signed)
Pharmacy Antibiotic Note  [name] is a 85 y.o. male admitted on 10/22/2018 with pneumonia.  Pharmacy has been consulted for vancomycin dosing.  Plan: Vancomycin 1500 IV every 48 hours.  Goal trough 15-20 mcg/mL.  Height: 5\' 5"  (165.1 cm) Weight: 260 lb (117.9 kg) IBW/kg (Calculated) : 61.5  Temp (24hrs), Avg:97.3 F (36.3 C), Min:97.3 F (36.3 C), Max:97.3 F (36.3 C)  Recent Labs  Lab 10/22/18 1125  WBC 7.4  CREATININE 2.64*    Estimated Creatinine Clearance: 25.2 mL/min (A) (by C-G formula based on SCr of 2.64 mg/dL (H)).    Allergies  Allergen Reactions  . Zocor [Simvastatin] Other (See Comments)    fatigue  . Lipitor [Atorvastatin] Other (See Comments)    Muscle cramping    Antimicrobials this admission: 7/11 vancomycin >>  7/11 cefepime x 1   Microbiology results: 7/11 BCx: ordered 7/11 MRSA PCR: ordered 7/11 Covid 19 sent   Thank you for allowing pharmacy to be a part of this patient's care.  Donna Christen Elyse Prevo 10/22/2018 2:22 PM

## 2018-10-22 NOTE — ED Notes (Signed)
Pt requesting pain meds for pain all over . EDP notifed

## 2018-10-22 NOTE — ED Notes (Signed)
ED TO INPATIENT HANDOFF REPORT  ED Nurse Name and Phone #: [phone]  S Name/Age/Gender [name] 85 y.o. male Room/Bed: APA02/APA02  Code Status   Code Status: Prior  Home/SNF/Other Home Patient oriented to: self, place, time and situation Is this baseline? yes  Triage Complete: Triage complete  Chief Complaint Multiple Complaints  Triage Note Swelling all over and increase sob x 2 days    Allergies Allergies  Allergen Reactions  . Zocor [Simvastatin] Other (See Comments)    fatigue  . Lipitor [Atorvastatin] Other (See Comments)    Muscle cramping    Level of Care/Admitting Diagnosis ED Disposition    ED Disposition Condition [place] [facility]: [facility] [[id number]]  Level of Care: Telemetry [5]  Covid Evaluation: Asymptomatic Screening Protocol (No Symptoms)  Diagnosis: Pneumonia [119147]  Admitting Physician: Kara Pacer  Attending Physician: Bethena Roys [phone]  Estimated length of stay: past midnight tomorrow  Certification:: I certify this patient will need inpatient services for at least 2 midnights  PT Class (Do Not Modify): Inpatient [101]  PT Acc Code (Do Not Modify): Private [1]       B Medical/Surgery History Past Medical History:  Diagnosis Date  . Allergic rhinitis   . Anxiety   . Arthritis   . COPD (chronic obstructive pulmonary disease) ([place])   . Coronary atherosclerosis of native coronary artery    a. CABG x 4 in 1994. Previous stent placement to SVG to distal RCA;  b. DES to native Cx in 2006;  c. DES SVG to RCA 05/08/11; d. Inf MI/Cath/PCI: LM 25, LAD 100, LCX patent stent, 50 into OM, RI 60-70, RCA 100, LIMA->LAD nl, VG->Diag 100 old, VG->RCA/PL 99 (DES x 1), EF 50-55%.  . Depression    With anxious features/hx panic attacks  . Essential hypertension, benign   . GERD (gastroesophageal reflux disease)   . Hyperlipidemia    Not on statin 2/2 hx of intolerance.   . Morbid obesity  ([place])   . Nephrolithiasis   . Pneumonia   . Sleep apnea   . ST elevation myocardial infarction (STEMI) of inferior wall ([place])    4/14 - DES SVG to PDA  . Type 2 diabetes mellitus ([place])    Past Surgical History:  Procedure Laterality Date  . CORONARY ARTERY BYPASS GRAFT  1994  . EYE SURGERY    . LEFT HEART CATHETERIZATION WITH CORONARY ANGIOGRAM N/A 05/08/2011   Procedure: LEFT HEART CATHETERIZATION WITH CORONARY ANGIOGRAM;  Surgeon: [name], MD;  Location: [facility] CATH LAB;  Service: Cardiovascular;  Laterality: N/A;  . LEFT HEART CATHETERIZATION WITH CORONARY ANGIOGRAM N/A 08/08/2012   Procedure: LEFT HEART CATHETERIZATION WITH CORONARY ANGIOGRAM;  Surgeon: [name], MD;  Location: [facility] CATH LAB;  Service: Cardiovascular;  Laterality: N/A;  . PERCUTANEOUS CORONARY STENT INTERVENTION (PCI-S) Right 05/08/2011   Procedure: PERCUTANEOUS CORONARY STENT INTERVENTION (PCI-S);  Surgeon: [name], MD;  Location: [facility] CATH LAB;  Service: Cardiovascular;  Laterality: Right;  . PERCUTANEOUS CORONARY STENT INTERVENTION (PCI-S) N/A 08/08/2012   Procedure: PERCUTANEOUS CORONARY STENT INTERVENTION (PCI-S);  Surgeon: [name], MD;  Location: [facility] CATH LAB;  Service: Cardiovascular;  Laterality: N/A;     A IV Location/Drains/Wounds Patient Lines/Drains/Airways Status   Active Line/Drains/Airways    Name:   Placement date:   Placement time:   Site:   Days:   Peripheral IV 10/22/18 Right;Lateral Antecubital   10/22/18    1435  Antecubital   less than 1   Peripheral IV 10/22/18 Left Antecubital   10/22/18    1444    Antecubital   less than 1          Intake/Output Last 24 hours No intake or output data in the 24 hours ending 10/22/18 1724  Labs/Imaging Results for orders placed or performed during the hospital encounter of 10/22/18 (from the past 48 hour(s))  Comprehensive metabolic panel     Status: Abnormal   Collection Time: 10/22/18 11:25 AM  Result Value Ref  Range   Sodium 132 (L) 135 - 145 mmol/L   Potassium 3.7 3.5 - 5.1 mmol/L   Chloride 93 (L) 98 - 111 mmol/L   CO2 28 22 - 32 mmol/L   Glucose, Bld 405 (H) 70 - 99 mg/dL   BUN 111 (H) 8 - 23 mg/dL    Comment: RESULTS CONFIRMED BY MANUAL DILUTION   Creatinine, Ser 2.64 (H) 0.61 - 1.24 mg/dL   Calcium 9.3 8.9 - 10.3 mg/dL   Total Protein 7.3 6.5 - 8.1 g/dL   Albumin 4.0 3.5 - 5.0 g/dL   AST 14 (L) 15 - 41 U/L   ALT 12 0 - 44 U/L   Alkaline Phosphatase 63 38 - 126 U/L   Total Bilirubin 0.4 0.3 - 1.2 mg/dL   GFR calc non Af Amer 21 (L) >60 mL/min   GFR calc Af Amer 25 (L) >60 mL/min   Anion gap 11 5 - 15    Comment: Performed at [facility], [address], [place], [place] [address]  CBC     Status: Abnormal   Collection Time: 10/22/18 11:25 AM  Result Value Ref Range   WBC 7.4 4.0 - 10.5 K/uL   RBC 4.23 4.22 - 5.81 MIL/uL   Hemoglobin 12.2 (L) 13.0 - 17.0 g/dL   HCT 38.1 (L) 39.0 - 52.0 %   MCV 90.1 80.0 - 100.0 fL   MCH 28.8 26.0 - 34.0 pg   MCHC 32.0 30.0 - 36.0 g/dL   RDW 15.1 11.5 - 15.5 %   Platelets 183 150 - 400 K/uL   nRBC 0.0 0.0 - 0.2 %    Comment: Performed at [facility], [address], [place], [place] [address]  Brain natriuretic peptide     Status: None   Collection Time: 10/22/18 11:25 AM  Result Value Ref Range   B Natriuretic Peptide 83.0 0.0 - 100.0 pg/mL    Comment: Performed at [facility], [address] [place], [place] [address]  Troponin I (High Sensitivity)     Status: Abnormal   Collection Time: 10/22/18 11:25 AM  Result Value Ref Range   Troponin I (High Sensitivity) 32.00 (H) <18 ng/L    Comment: (NOTE) Elevated high sensitivity troponin I (hsTnI) values and significant  changes across serial measurements may suggest ACS but many other  chronic and acute conditions are known to elevate hsTnI results.  Refer to the Links section for chest pain algorithms and additional  guidance. Performed at [facility], [address], [place], [place]  [address]   SARS Coronavirus 2 ([place] - Performed in [facility] hospital lab), Hosp Order     Status: None   Collection Time: 10/22/18  1:44 PM   Specimen: Nasopharyngeal Swab  Result Value Ref Range   SARS Coronavirus 2 NEGATIVE NEGATIVE    Comment: (NOTE) If result is NEGATIVE SARS-CoV-2 target nucleic acids are NOT DETECTED. The SARS-CoV-2 RNA is generally detectable in  upper and lower  respiratory specimens during the acute phase of infection. The lowest  concentration of SARS-CoV-2 viral copies this assay can detect is 250  copies / mL. A negative result does not preclude SARS-CoV-2 infection  and should not be used as the sole basis for treatment or other  patient management decisions.  A negative result may occur with  improper specimen collection / handling, submission of specimen other  than nasopharyngeal swab, presence of viral mutation(s) within the  areas targeted by this assay, and inadequate number of viral copies  (<250 copies / mL). A negative result must be combined with clinical  observations, patient history, and epidemiological information. If result is POSITIVE SARS-CoV-2 target nucleic acids are DETECTED. The SARS-CoV-2 RNA is generally detectable in upper and lower  respiratory specimens dur ing the acute phase of infection.  Positive  results are indicative of active infection with SARS-CoV-2.  Clinical  correlation with patient history and other diagnostic information is  necessary to determine patient infection status.  Positive results do  not rule out bacterial infection or co-infection with other viruses. If result is PRESUMPTIVE POSTIVE SARS-CoV-2 nucleic acids MAY BE PRESENT.   A presumptive positive result was obtained on the submitted specimen  and confirmed on repeat testing.  While 2019 novel coronavirus  (SARS-CoV-2) nucleic acids may be present in the submitted sample  additional confirmatory testing may be necessary for epidemiological  and /  or clinical management purposes  to differentiate between  SARS-CoV-2 and other Sarbecovirus currently known to infect humans.  If clinically indicated additional testing with an alternate test  methodology [phone]) is advised. The SARS-CoV-2 RNA is generally  detectable in upper and lower respiratory sp ecimens during the acute  phase of infection. The expected result is Negative. Fact Sheet for Patients:  StrictlyIdeas.no Fact Sheet for Healthcare Providers: BankingDealers.co.za This test is not yet approved or cleared by the [place] FDA and has been authorized for detection and/or diagnosis of SARS-CoV-2 by FDA under an Emergency Use Authorization (EUA).  This EUA will remain in effect (meaning this test can be used) for the duration of the COVID-19 declaration under Section 564(b)(1) of the Act, 21 U.S.C. section 360bbb-3(b)(1), unless the authorization is terminated or revoked sooner. Performed at [facility], [address] [place], [place] [address]   Troponin I (High Sensitivity)     Status: Abnormal   Collection Time: 10/22/18  1:52 PM  Result Value Ref Range   Troponin I (High Sensitivity) 36.00 (H) <18 ng/L    Comment: (NOTE) Elevated high sensitivity troponin I (hsTnI) values and significant  changes across serial measurements may suggest ACS but many other  chronic and acute conditions are known to elevate hsTnI results.  Refer to the "Links" section for chest pain algorithms and additional  guidance. Performed at [facility], [address] [place], [place] [address]   MRSA PCR Screening     Status: None   Collection Time: 10/22/18  2:23 PM   Specimen: Nasal Mucosa; Nasopharyngeal  Result Value Ref Range   MRSA by PCR NEGATIVE NEGATIVE    Comment:        The GeneXpert MRSA Assay (FDA approved for NASAL specimens only), is one component of a comprehensive MRSA colonization surveillance program. It is  not intended to diagnose MRSA infection nor to guide or monitor treatment for MRSA infections. Performed at [facility], [address], [place], [place] [address]   Culture, blood (Routine X 2) w  Reflex to ID Panel     Status: None (Preliminary result)   Collection Time: 10/22/18  2:31 PM   Specimen: BLOOD RIGHT ARM  Result Value Ref Range   Specimen Description      BLOOD RIGHT ARM BOTTLES DRAWN AEROBIC AND ANAEROBIC   Special Requests      Blood Culture adequate volume Performed at [facility], [address], [place], [place] [address]    Culture PENDING    Report Status PENDING   Lactic acid, plasma     Status: None   Collection Time: 10/22/18  2:32 PM  Result Value Ref Range   Lactic Acid, Venous 1.7 0.5 - 1.9 mmol/L    Comment: Performed at [facility], [address], [place], [place] [address]  Culture, blood (Routine X 2) w Reflex to ID Panel     Status: None (Preliminary result)   Collection Time: 10/22/18  2:42 PM   Specimen: BLOOD LEFT HAND  Result Value Ref Range   Specimen Description      BLOOD LEFT HAND BOTTLES DRAWN AEROBIC AND ANAEROBIC   Special Requests      Blood Culture results may not be optimal due to an inadequate volume of blood received in culture bottles Performed at [facility], [address] [place], [place] [address]    Culture PENDING    Report Status PENDING    Dg Chest Port 1 View  Result Date: 10/22/2018 CLINICAL DATA:  Shortness of breath EXAM: PORTABLE CHEST 1 VIEW COMPARISON:  July 21, 2018 FINDINGS: There is airspace consolidation in a portion of the left lower lobe with small left pleural effusion. The right lung is clear. There is cardiomegaly with mild pulmonary venous hypertension. No adenopathy. There is aortic atherosclerosis. Patient is status post median sternotomy. No bone lesions. IMPRESSION: Airspace consolidation felt to represent a degree of pneumonia left lower lobe. Small left pleural effusion. Right lung clear. Stable  cardiomegaly. Aortic Atherosclerosis (ICD10-I70.0). Electronically Signed   By: Lowella Grip III M.D.   On: 10/22/2018 12:13    Pending Labs [employer] (From admission, onward)    Start     Ordered   Signed and Occupational hygienist morning,   R     Signed and Held   Signed and Held  CBC  Tomorrow morning,   R     Signed and Held          Vitals/Pain Today's Vitals   10/22/18 1530 10/22/18 1600 10/22/18 1644 10/22/18 1700  BP: 110/65 (!) 115/51  (!) 142/45  Pulse: 67 75  80  Resp:      Temp:      TempSrc:      SpO2: 95% 93%  (!) 85%  Weight:      Height:      PainSc:   8      Isolation Precautions No active isolations  Medications Medications  vancomycin (VANCOCIN) 1,500 mg in sodium chloride 0.9 % 500 mL IVPB (has no administration in time range)  HYDROcodone-acetaminophen (NORCO/VICODIN) 5-325 MG per tablet 1 tablet (1 tablet Oral Given 10/22/18 1644)  insulin aspart (novoLOG) injection 10 Units (10 Units Intravenous Given 10/22/18 1341)  ceFEPIme (MAXIPIME) 1 g in sodium chloride 0.9 % 100 mL IVPB (0 g Intravenous Stopped 10/22/18 1513)  HYDROmorphone (DILAUDID) injection 0.5 mg (0.5 mg Intravenous Given 10/22/18 1407)  ondansetron (ZOFRAN) injection 4 mg (4 mg Intravenous Given 10/22/18 1407)  vancomycin (VANCOCIN) IVPB 1000 mg/200 mL  premix (0 mg Intravenous Stopped 10/22/18 1701)  polymixin-bacitracin (POLYSPORIN) 500-10000 UNIT/GM ointment (  Given 10/22/18 1623)    Mobility walks with device High fall risk   Focused Assessments    R Recommendations: See Admitting Provider Note  Report given to:   Additional Notes:

## 2018-10-22 NOTE — ED Triage Notes (Signed)
Swelling all over and increase sob x 2 days

## 2018-10-22 NOTE — H&P (Addendum)
History and Physical    [name] MEQ:[id number] DOB: 1933/11/30 DOA: 10/22/2018  PCP: [name], MD   Patient coming from: Home  I have personally briefly reviewed patient's old medical records in [place]  Chief Complaint: SOB, leg swelling.  HPI: [name] is a 85 y.o. male with medical history significant for systolic and diastolic CHF, COPD, ST elevation MI, morbid obesity-BMI 49, hypertension, diabetes mellitus, CKD 3.  Patient presented to the ED with complaints of difficulty breathing of 2 days duration, and worsening of his lower extremity swelling over the past 1 week.  He also tells me his abdomen feels more full than usual.  He also reports some mild nonproductive cough. He is unaware if he has actually been taking his torsemide, or if he is on Lasix. I talked to patient spouse on the phone, she tells me patient has been taking his torsemide every morning, she believes his dose is 20 mg every day, or that he has been alternating 20mg  and 40mg . He denies chest pain.  Patient was admitted improved 4/9 to 4/13 for chest discomfort and symptoms of unstable angina, treated medically.  Also required IV diuresis.  Patient tells me he was subsequently sent to nursing home for 2 weeks and he has been home since then.  ED Course: Blood pressure systolic 297- 989.  Temperature 97.3.  WBC 7.4.  BNP is 83.  Elevated glucose 405.  Elevated creatinine 2.64.  Sodium 132.  Chest x-ray suggested left lower lobe pneumonia with a small left pleural effusion.  High-sensitivity troponin 32 >> 36.  EKG shows ectopic atrial rhythm, difficult changes compared to prior.  Patient was started on IV vancomycin in the ED-as patient was just about the 90th day criteria for healthcare associated pneumonia.  Review of Systems: As per HPI all other systems reviewed and negative.  Past Medical History:  Diagnosis Date  . Allergic rhinitis   . Anxiety   . Arthritis   . COPD (chronic  obstructive pulmonary disease) ([place])   . Coronary atherosclerosis of native coronary artery    a. CABG x 4 in 1994. Previous stent placement to SVG to distal RCA;  b. DES to native Cx in 2006;  c. DES SVG to RCA 05/08/11; d. Inf MI/Cath/PCI: LM 25, LAD 100, LCX patent stent, 50 into OM, RI 60-70, RCA 100, LIMA->LAD nl, VG->Diag 100 old, VG->RCA/PL 99 (DES x 1), EF 50-55%.  . Depression    With anxious features/hx panic attacks  . Essential hypertension, benign   . GERD (gastroesophageal reflux disease)   . Hyperlipidemia    Not on statin 2/2 hx of intolerance.   . Morbid obesity ([place])   . Nephrolithiasis   . Pneumonia   . Sleep apnea   . ST elevation myocardial infarction (STEMI) of inferior wall ([place])    4/14 - DES SVG to PDA  . Type 2 diabetes mellitus ([place])     Past Surgical History:  Procedure Laterality Date  . CORONARY ARTERY BYPASS GRAFT  1994  . EYE SURGERY    . LEFT HEART CATHETERIZATION WITH CORONARY ANGIOGRAM N/A 05/08/2011   Procedure: LEFT HEART CATHETERIZATION WITH CORONARY ANGIOGRAM;  Surgeon: [name], MD;  Location[facility] CATH LAB;  Service: Cardiovascular;  Laterality: N/A;  . LEFT HEART CATHETERIZATION WITH CORONARY ANGIOGRAM N/A 08/08/2012   Procedure: LEFT HEART CATHETERIZATION WITH CORONARY ANGIOGRAM;  Surgeon: [name], MD;  Location: [facility] CATH LAB;  Service: Cardiovascular;  Laterality:  N/A;  . PERCUTANEOUS CORONARY STENT INTERVENTION (PCI-S) Right 05/08/2011   Procedure: PERCUTANEOUS CORONARY STENT INTERVENTION (PCI-S);  Surgeon: [name], MD;  Location: [facility] CATH LAB;  Service: Cardiovascular;  Laterality: Right;  . PERCUTANEOUS CORONARY STENT INTERVENTION (PCI-S) N/A 08/08/2012   Procedure: PERCUTANEOUS CORONARY STENT INTERVENTION (PCI-S);  Surgeon: [name], MD;  Location: [facility] CATH LAB;  Service: Cardiovascular;  Laterality: N/A;     reports that he has never smoked. He has never used smokeless tobacco. He reports that he does  not drink alcohol or use drugs.  Allergies  Allergen Reactions  . Zocor [Simvastatin] Other (See Comments)    fatigue  . Lipitor [Atorvastatin] Other (See Comments)    Muscle cramping    Family History  Problem Relation Age of Onset  . Heart attack Mother        Age 59    Prior to Admission medications   Medication Sig Start Date End Date Taking? Authorizing Provider  allopurinol (ZYLOPRIM) 100 MG tablet Take 2 tablets (200 mg total) by mouth daily. 08/17/18  Yes Gerlene Fee, NP  alprazolam Duanne Moron) 2 MG tablet Take 1 tablet by mouth 2 (two) times a day. 09/20/18  Yes [provider]  aspirin EC 81 MG tablet Take 81 mg by mouth every morning.   Yes [provider]  clopidogrel (PLAVIX) 75 MG tablet take 1 tablet by mouth once daily WITH BREAKFAST 08/17/18  Yes Gerlene Fee, NP  hydrALAZINE (APRESOLINE) 10 MG tablet Take 1 tablet (10 mg total) by mouth every 8 (eight) hours. 08/17/18  Yes Gerlene Fee, NP  insulin aspart (NOVOLOG) 100 UNIT/ML injection Inject 7 Units into the skin 3 (three) times daily with meals. Give only if eats 50% or more of meal. 08/17/18  Yes Gerlene Fee, NP  Insulin Glargine, 2 Unit Dial, (TOUJEO MAX SOLOSTAR) 300 UNIT/ML SOPN Inject 60 Units into the skin at bedtime. 08/17/18  Yes Gerlene Fee, NP  isosorbide mononitrate (IMDUR) 30 MG 24 hr tablet TAKE 1 TABLET(30 MG) BY MOUTH TWICE DAILY 10/19/18  Yes Satira Sark, MD  metoprolol tartrate (LOPRESSOR) 50 MG tablet TAKE 1 TABLET(50 MG) BY MOUTH TWICE DAILY 10/19/18  Yes Satira Sark, MD  nitroGLYCERIN (NITROLINGUAL) 0.4 MG/SPRAY spray Place 1 spray under the tongue every 5 (five) minutes x 3 doses as needed for chest pain. 08/17/18  Yes Gerlene Fee, NP  ondansetron (ZOFRAN) 4 MG tablet Take 1 tablet (4 mg total) by mouth every 8 (eight) hours as needed for nausea or vomiting. 08/17/18  Yes Gerlene Fee, NP  OXYGEN Inhale 2 L/min into the lungs continuous.   Yes [provider]  polyethylene glycol powder (GLYCOLAX/MIRALAX) powder Take 17 g by mouth daily as needed (constipation).  02/08/13  Yes [provider]  psyllium (METAMUCIL) 58.6 % powder Take 2 tsp by mouth: Mix with 8 ounces liquid and drink once a day   Yes [provider]  tamsulosin (FLOMAX) 0.4 MG CAPS capsule Take 1 capsule (0.4 mg total) by mouth every evening. 08/17/18  Yes Gerlene Fee, NP  UNABLE TO FIND CPAP from home while sleeping, at previous home settings   Yes [provider]  acetaminophen (TYLENOL) 325 MG tablet Take 650 mg by mouth every 4 (four) hours as needed.    [provider]  torsemide (DEMADEX) 20 MG tablet Take 1 tablet (20 mg total) by mouth daily. Beginning 08/05/2018 Patient not taking: Reported  on 09/07/2018 08/17/18   Gerlene Fee, NP    Physical Exam: Vitals:   10/22/18 1300 10/22/18 1330 10/22/18 1530 10/22/18 1600  BP: 117/64 (!) 134/48 110/65 (!) 115/51  Pulse: 72 77 67 75  Resp: 15 10    Temp:      TempSrc:      SpO2: 93% 91% 95% 93%  Weight:      Height:        Constitutional: NAD, calm, comfortable Vitals:   10/22/18 1300 10/22/18 1330 10/22/18 1530 10/22/18 1600  BP: 117/64 (!) 134/48 110/65 (!) 115/51  Pulse: 72 77 67 75  Resp: 15 10    Temp:      TempSrc:      SpO2: 93% 91% 95% 93%  Weight:      Height:       Eyes: PERRL, lids and conjunctivae normal ENMT: Mucous membranes are moist. Posterior pharynx clear of any exudate or lesions.  Neck: normal, supple, no masses, no thyromegaly Respiratory: clear to auscultation bilaterally, no wheezing, no crackles. Normal respiratory effort. No accessory muscle use.  Cardiovascular: Regular rate and rhythm, no murmurs / rubs / gallops.  1+ pitting pedal edema to upper third of legs bilaterally.. 2+ pedal pulses.   Abdomen: no tenderness, no masses palpated. No hepatosplenomegaly. Bowel sounds positive.  Musculoskeletal: no clubbing / cyanosis. No joint  deformity upper and lower extremities. Good ROM, no contractures. Normal muscle tone.  Skin: Right foot big toe nail ripped off about a week ago, open wound present with minimal drainage.  Tenderness present, but no significant surrounding erythema or warmth. Neurologic: CN 2-12 grossly intact. Strength 5/5 in all 4.  Psychiatric: Normal judgment and insight. Alert and oriented x 3. Normal mood.   Labs on Admission: I have personally reviewed following labs and imaging studies  CBC: Recent Labs  Lab 10/22/18 1125  WBC 7.4  HGB 12.2*  HCT 38.1*  MCV 90.1  PLT 482   Basic Metabolic Panel: Recent Labs  Lab 10/22/18 1125  NA 132*  K 3.7  CL 93*  CO2 28  GLUCOSE 405*  BUN 111*  CREATININE 2.64*  CALCIUM 9.3   Liver Function Tests: Recent Labs  Lab 10/22/18 1125  AST 14*  ALT 12  ALKPHOS 63  BILITOT 0.4  PROT 7.3  ALBUMIN 4.0    Radiological Exams on Admission: Dg Chest Port 1 View  Result Date: 10/22/2018 CLINICAL DATA:  Shortness of breath EXAM: PORTABLE CHEST 1 VIEW COMPARISON:  July 21, 2018 FINDINGS: There is airspace consolidation in a portion of the left lower lobe with small left pleural effusion. The right lung is clear. There is cardiomegaly with mild pulmonary venous hypertension. No adenopathy. There is aortic atherosclerosis. Patient is status post median sternotomy. No bone lesions. IMPRESSION: Airspace consolidation felt to represent a degree of pneumonia left lower lobe. Small left pleural effusion. Right lung clear. Stable cardiomegaly. Aortic Atherosclerosis (ICD10-I70.0). Electronically Signed   By: Lowella Grip III M.D.   On: 10/22/2018 12:13    EKG: Independently reviewed.  Ectopic atrial rhythm, QTC 448.  No significant ST or T wave abnormalities compared to prior EKG.  Assessment/Plan Active Problems:   Pneumonia  Left lower lobe pneumonia-shortness of breath, cough, O2 sats 88% on room air improved on 2 L nasal cannula.  Afebrile.  WBC 7.4.   Patient rules out for sepsis.  With normal lactic acid 1.7.  Portable chest x-ray left lower lobe pneumonia, small left pleural effusion.  IV vancomycin and cefepime started in the ED.  Patient barely meets criteria for healthcare associated pneumonia-considering hospitalization in April and subsequent nursing home placement.  SARS COVID 2- negative. -Will continue IV cefepime and add IV azithromycin for atypical coverage. -With AKI, will DC [facility] -Follow-up MRSA PCR- Negative. -Follow-up blood cultures drawn in ED - CBC, BMP a.m  Acute kidney injury on CKD 3- creatinine 2.64. Likely cardiorenal etiology vs diuretics. Spouse reports compliance with torsemide every morning, but unsure if he has been taking 20mg  daily or 20 and 40mg  on alternate days. On recent hospitalization, creatinine was 1.9, downtrend from peak of 2.3 with diuresis. -BMP a.m. - Hold Torsemide for now  Combined systolic and diastolic CHF-appears mildly volume overloaded with 1+ pitting pedal edema to knees.  Port Chest x-ray without edema.  BNP 83 improved compared to prior, but patient morbidly obese.  Weights per chart have improved over the past 6 months. Last echo 07/2018 EF 40 to 45%, impaired diastolic relaxation.  Patient on torsemide 20 mg daily or 20-40mg . -Considering acute kidney injury, for now hold off on torsemide, consider resuming in a.m. or IV diuresis pending renal function -BMP a.m. -Strict input output, daily weight -Continue metoprolol, hydralazine, Imdur.  Elevated troponin with hx of Coronary artery disease- 32 >> 36.  On recent hospitalization troponin peaked at 0.9.  Medical management was recommended by cardiology.  Patient has a history of ST elevation MI, CABG and subsequent stent placement.  He denies chest pain.  EKG is unchanged from prior.  Dyspnea likely from infectious etiology. -Continue home aspirin and Plavix -Continue home metoprolol  Right toe wound-  -Wound care consult  Diabetes  mellitus-random glucose 405. HgbA1c 07/2018-11.5 - Continue home Lantus 60 units nightly. - SSI - Hold home meal coverage for now  Morbid obesity, COPD-stable.  BMI 43.  On CPAP and O2 at night.  Does not require home O2 during the day. -CPAP nightly  Depression-Spouse confirms home medications Xanax 2 mg qhs. -Cont home Xanax.   DVT prophylaxis: Lovenox Code Status: DNR-consistent with prior documentation in chart and reconfirmed with patient spouse today.  Patient has a living will this is DO NOT RESUSCITATE. Family Communication: Spouse on the phone, plan of care explained and all questions answered Disposition Plan: Per rounding team Consults called: None Admission status: Patient, telemetry I certify that at the point of admission it is my clinical judgment that the patient will require inpatient hospital care spanning beyond 2 midnights from the point of admission due to high intensity of service, high risk for further deterioration and high frequency of surveillance required. The following factors support the patient status of inpatient: Presence of left lower lobe pneumonia, with mild daytime hypoxemia, acute kidney injury, and mild volume overloaded, requiring antibiotics, close monitoring of kidney function and may need some diuresis prior to discharge.   Bethena Roys MD [facility]  10/22/2018, 5:36 PM

## 2018-10-22 NOTE — ED Provider Notes (Signed)
[facility] EMERGENCY DEPARTMENT Provider Note   CSN: [id number] Arrival date & time: 10/22/18  1049     History   Chief Complaint Chief Complaint  Patient presents with  . Leg Swelling    HPI [name] is a 85 y.o. male.     Patient on past chart review known to have diastolic heart failure and coronary artery disease.  Patient comes in with a complaint of shortness of breath for about 2 to 3 days.  And increased leg swelling for about a week.  Normally uses oxygen at night chart review shows that he has sleep apnea so it may be CPAP that he uses at night.  Does not use oxygen during the daytime.  Patient's oxygen saturations on the 2 L was in the low 90s.  Patient denied any chest pain.     Past Medical History:  Diagnosis Date  . Allergic rhinitis   . Anxiety   . Arthritis   . COPD (chronic obstructive pulmonary disease) ([place])   . Coronary atherosclerosis of native coronary artery    a. CABG x 4 in 1994. Previous stent placement to SVG to distal RCA;  b. DES to native Cx in 2006;  c. DES SVG to RCA 05/08/11; d. Inf MI/Cath/PCI: LM 25, LAD 100, LCX patent stent, 50 into OM, RI 60-70, RCA 100, LIMA->LAD nl, VG->Diag 100 old, VG->RCA/PL 99 (DES x 1), EF 50-55%.  . Depression    With anxious features/hx panic attacks  . Essential hypertension, benign   . GERD (gastroesophageal reflux disease)   . Hyperlipidemia    Not on statin 2/2 hx of intolerance.   . Morbid obesity ([place])   . Nephrolithiasis   . Pneumonia   . Sleep apnea   . ST elevation myocardial infarction (STEMI) of inferior wall ([place])    4/14 - DES SVG to PDA  . Type 2 diabetes mellitus [facility])     Patient Active Problem List   Diagnosis Date Noted  . E. coli UTI 08/04/2018  . CKD stage 3 due to type 2 diabetes mellitus ([place]) 07/27/2018  . Chronic constipation 07/27/2018  . COPD (chronic obstructive pulmonary disease) ([place]) 07/27/2018  . BPH without urinary obstruction 07/27/2018  . Chronic gout due  to renal impairment without tophus 07/27/2018  . Depression with anxiety 07/27/2018  . Mild aortic stenosis 07/27/2018  . Unstable angina ([place]) 07/22/2018  . Demand ischemia ([place])   . Secondary cardiomyopathy ([place])   . Chest pain 07/21/2018  . Elevated troponin 07/21/2018  . Acute on chronic diastolic heart failure ([place])   . CKD (chronic kidney disease) stage 3, GFR 30-59 ml/min ([facility]) 01/01/2014  . Chronic combined systolic and diastolic CHF (congestive heart failure) ([place]) 08/11/2012  . CAD (coronary artery disease) of artery bypass graft 08/08/2012  . Sleep apnea   . Essential hypertension, benign   . Obesity, Class III, BMI 40-49.9 (morbid obesity) ([place])   . Hyperlipidemia   . Type 2 diabetes mellitus with stage 3 chronic kidney disease, with long-term current use of insulin ([place])   . Coronary artery disease involving native coronary artery of native heart with unstable angina pectoris [facility])     Past Surgical History:  Procedure Laterality Date  . CORONARY ARTERY BYPASS GRAFT  1994  . EYE SURGERY    . LEFT HEART CATHETERIZATION WITH CORONARY ANGIOGRAM N/A 05/08/2011   Procedure: LEFT HEART CATHETERIZATION WITH CORONARY ANGIOGRAM;  Surgeon: [name], MD;  Location: [facility] CATH LAB;  Service: Cardiovascular;  Laterality: N/A;  . LEFT HEART CATHETERIZATION WITH CORONARY ANGIOGRAM N/A 08/08/2012   Procedure: LEFT HEART CATHETERIZATION WITH CORONARY ANGIOGRAM;  Surgeon: [name], MD;  Location: [facility] CATH LAB;  Service: Cardiovascular;  Laterality: N/A;  . PERCUTANEOUS CORONARY STENT INTERVENTION (PCI-S) Right 05/08/2011   Procedure: PERCUTANEOUS CORONARY STENT INTERVENTION (PCI-S);  Surgeon: [name], MD;  Location: [facility] CATH LAB;  Service: Cardiovascular;  Laterality: Right;  . PERCUTANEOUS CORONARY STENT INTERVENTION (PCI-S) N/A 08/08/2012   Procedure: PERCUTANEOUS CORONARY STENT INTERVENTION (PCI-S);  Surgeon: [name], MD;  Location: [facility] CATH LAB;   Service: Cardiovascular;  Laterality: N/A;        Home Medications    Prior to Admission medications   Medication Sig Start Date End Date Taking? Authorizing Provider  allopurinol (ZYLOPRIM) 100 MG tablet Take 2 tablets (200 mg total) by mouth daily. 08/17/18  Yes Gerlene Fee, NP  alprazolam Duanne Moron) 2 MG tablet Take 1 tablet by mouth 2 (two) times a day. 09/20/18  Yes [provider]  aspirin EC 81 MG tablet Take 81 mg by mouth every morning.   Yes [provider]  clopidogrel (PLAVIX) 75 MG tablet take 1 tablet by mouth once daily WITH BREAKFAST 08/17/18  Yes Gerlene Fee, NP  hydrALAZINE (APRESOLINE) 10 MG tablet Take 1 tablet (10 mg total) by mouth every 8 (eight) hours. 08/17/18  Yes Gerlene Fee, NP  insulin aspart (NOVOLOG) 100 UNIT/ML injection Inject 7 Units into the skin 3 (three) times daily with meals. Give only if eats 50% or more of meal. 08/17/18  Yes Gerlene Fee, NP  Insulin Glargine, 2 Unit Dial, (TOUJEO MAX SOLOSTAR) 300 UNIT/ML SOPN Inject 60 Units into the skin at bedtime. 08/17/18  Yes Gerlene Fee, NP  isosorbide mononitrate (IMDUR) 30 MG 24 hr tablet TAKE 1 TABLET(30 MG) BY MOUTH TWICE DAILY 10/19/18  Yes Satira Sark, MD  metoprolol tartrate (LOPRESSOR) 50 MG tablet TAKE 1 TABLET(50 MG) BY MOUTH TWICE DAILY 10/19/18  Yes Satira Sark, MD  nitroGLYCERIN (NITROLINGUAL) 0.4 MG/SPRAY spray Place 1 spray under the tongue every 5 (five) minutes x 3 doses as needed for chest pain. 08/17/18  Yes Gerlene Fee, NP  ondansetron (ZOFRAN) 4 MG tablet Take 1 tablet (4 mg total) by mouth every 8 (eight) hours as needed for nausea or vomiting. 08/17/18  Yes Gerlene Fee, NP  OXYGEN Inhale 2 L/min into the lungs continuous.   Yes [provider]  polyethylene glycol powder (GLYCOLAX/MIRALAX) powder Take 17 g by mouth daily as needed (constipation).  02/08/13  Yes [provider]  psyllium (METAMUCIL) 58.6 % powder Take 2 tsp by  mouth: Mix with 8 ounces liquid and drink once a day   Yes [provider]  tamsulosin (FLOMAX) 0.4 MG CAPS capsule Take 1 capsule (0.4 mg total) by mouth every evening. 08/17/18  Yes Gerlene Fee, NP  UNABLE TO FIND CPAP from home while sleeping, at previous home settings   Yes [provider]  acetaminophen (TYLENOL) 325 MG tablet Take 650 mg by mouth every 4 (four) hours as needed.    [provider]  torsemide (DEMADEX) 20 MG tablet Take 1 tablet (20 mg total) by mouth daily. Beginning 08/05/2018 Patient not taking: Reported on 09/07/2018 08/17/18   Gerlene Fee, NP    Family History Family History  Problem Relation Age of Onset  . Heart attack Mother  Age 54    Social History Social History   Tobacco Use  . Smoking status: Never Smoker  . Smokeless tobacco: Never Used  Substance Use Topics  . Alcohol use: No    Alcohol/week: 0.0 standard drinks  . Drug use: No     Allergies   Zocor [simvastatin] and Lipitor [atorvastatin]   Review of Systems Review of Systems  Constitutional: Negative for chills and fever.  HENT: Negative for congestion, rhinorrhea and sore throat.   Eyes: Negative for visual disturbance.  Respiratory: Positive for shortness of breath. Negative for cough.   Cardiovascular: Positive for leg swelling. Negative for chest pain.  Gastrointestinal: Negative for abdominal pain, diarrhea, nausea and vomiting.  Genitourinary: Negative for dysuria.  Musculoskeletal: Negative for back pain and neck pain.  Skin: Negative for rash.  Neurological: Negative for dizziness, light-headedness and headaches.  Hematological: Does not bruise/bleed easily.  Psychiatric/Behavioral: Negative for confusion.     Physical Exam Updated Vital Signs BP (!) 134/48   Pulse 77   Temp (!) 97.3 F (36.3 C) (Oral)   Resp 10   Ht 1.651 m (5\' 5" )   Wt 117.9 kg   SpO2 91%   BMI 43.27 kg/m   Physical Exam Vitals signs and nursing note  reviewed.  Constitutional:      Appearance: He is well-developed.  HENT:     Head: Normocephalic and atraumatic.  Eyes:     Extraocular Movements: Extraocular movements intact.     Conjunctiva/sclera: Conjunctivae normal.     Pupils: Pupils are equal, round, and reactive to light.  Neck:     Musculoskeletal: Normal range of motion and neck supple.  Cardiovascular:     Rate and Rhythm: Normal rate and regular rhythm.     Heart sounds: No murmur.  Pulmonary:     Effort: Pulmonary effort is normal. No respiratory distress.     Breath sounds: Normal breath sounds.  Abdominal:     Palpations: Abdomen is soft.     Tenderness: There is no abdominal tenderness.  Musculoskeletal:     Right lower leg: Edema present.     Left lower leg: Edema present.     Comments: Patient's had an avulsion of his left great toenail.  This is occurred over several weeks.  Skin:    General: Skin is warm and dry.  Neurological:     General: No focal deficit present.     Mental Status: He is alert and oriented to person, place, and time.      ED Treatments / Results  Labs (all labs ordered are listed, but only abnormal results are displayed) Labs Reviewed  COMPREHENSIVE METABOLIC PANEL - Abnormal; Notable for the following components:      Result Value   Sodium 132 (*)    Chloride 93 (*)    Glucose, Bld 405 (*)    BUN 111 (*)    Creatinine, Ser 2.64 (*)    AST 14 (*)    GFR calc non Af Amer 21 (*)    GFR calc Af Amer 25 (*)    All other components within normal limits  CBC - Abnormal; Notable for the following components:   Hemoglobin 12.2 (*)    HCT 38.1 (*)    All other components within normal limits  TROPONIN I (HIGH SENSITIVITY) - Abnormal; Notable for the following components:   Troponin I (High Sensitivity) 32.00 (*)    All other components within normal limits  TROPONIN I (HIGH SENSITIVITY) -  Abnormal; Notable for the following components:   Troponin I (High Sensitivity) 36.00 (*)     All other components within normal limits  SARS CORONAVIRUS 2 (HOSPITAL ORDER, [facility])  CULTURE, BLOOD (ROUTINE X 2)  CULTURE, BLOOD (ROUTINE X 2)  MRSA PCR SCREENING  BRAIN NATRIURETIC PEPTIDE  LACTIC ACID, PLASMA    EKG None  Radiology Dg Chest Port 1 View  Result Date: 10/22/2018 CLINICAL DATA:  Shortness of breath EXAM: PORTABLE CHEST 1 VIEW COMPARISON:  July 21, 2018 FINDINGS: There is airspace consolidation in a portion of the left lower lobe with small left pleural effusion. The right lung is clear. There is cardiomegaly with mild pulmonary venous hypertension. No adenopathy. There is aortic atherosclerosis. Patient is status post median sternotomy. No bone lesions. IMPRESSION: Airspace consolidation felt to represent a degree of pneumonia left lower lobe. Small left pleural effusion. Right lung clear. Stable cardiomegaly. Aortic Atherosclerosis (ICD10-I70.0). Electronically Signed   By: Lowella Grip III M.D.   On: 10/22/2018 12:13    Procedures Procedures (including critical care time)  Medications Ordered in ED Medications  vancomycin (VANCOCIN) IVPB 1000 mg/200 mL premix (1,000 mg Intravenous New Bag/Given 10/22/18 1445)  vancomycin (VANCOCIN) 1,500 mg in sodium chloride 0.9 % 500 mL IVPB (has no administration in time range)  polymixin-bacitracin (POLYSPORIN) 500-10000 UNIT/GM ointment (has no administration in time range)  insulin aspart (novoLOG) injection 10 Units (10 Units Intravenous Given 10/22/18 1341)  ceFEPIme (MAXIPIME) 1 g in sodium chloride 0.9 % 100 mL IVPB (0 g Intravenous Stopped 10/22/18 1513)  HYDROmorphone (DILAUDID) injection 0.5 mg (0.5 mg Intravenous Given 10/22/18 1407)  ondansetron (ZOFRAN) injection 4 mg (4 mg Intravenous Given 10/22/18 1407)     Initial Impression / Assessment and Plan / ED Course  I have reviewed the triage vital signs and the nursing notes.  Pertinent labs & imaging results that were available  during my care of the patient were reviewed by me and considered in my medical decision making (see chart for details).        Patient chest x-ray consistent with pneumonia may explain the hypoxia.  Patient oxygen saturations are fine on 2 L.  But he does not normally use oxygen during the daytime.  His sats went down to below 90 on room air.  Patient with marked bilateral lower extremity edema.  However chest x-ray does not show any significant pulmonary edema.  Patient's troponins were in the 30 range.  Only increased on the second troponin by 4.  Patient's pneumonia technically could be a hospital-acquired pneumonia since he was in the 90-day window of being in the hospital.  So he was treated with HCAP antibiotics.  Discussed with hospitalist for admission feel based on the troponins that patient may require admission to [facility].  Patient known to have diastolic heart failure.  And coronary artery disease.  Is followed by cardiology.  Final Clinical Impressions(s) / ED Diagnoses   Final diagnoses:  Leg swelling  Bilateral lower extremity edema  Hypoxia  AKI (acute kidney injury) ([place])  HCAP (healthcare-associated pneumonia)  Elevated troponin    ED Discharge Orders    None       Fredia Sorrow, MD 10/22/18 1546

## 2018-10-22 NOTE — ED Notes (Signed)
Blood cultures x 2 collected prior to initiation of ABT

## 2018-10-22 NOTE — Progress Notes (Signed)
Patient refused CPAP, no unit in room at this time.

## 2018-10-23 DIAGNOSIS — N179 Acute kidney failure, unspecified: Secondary | ICD-10-CM

## 2018-10-23 DIAGNOSIS — R7989 Other specified abnormal findings of blood chemistry: Secondary | ICD-10-CM

## 2018-10-23 DIAGNOSIS — E1121 Type 2 diabetes mellitus with diabetic nephropathy: Secondary | ICD-10-CM

## 2018-10-23 DIAGNOSIS — I5043 Acute on chronic combined systolic (congestive) and diastolic (congestive) heart failure: Secondary | ICD-10-CM

## 2018-10-23 LAB — GLUCOSE, CAPILLARY
Glucose-Capillary: 225 mg/dL — ABNORMAL HIGH (ref 70–99)
Glucose-Capillary: 303 mg/dL — ABNORMAL HIGH (ref 70–99)
Glucose-Capillary: 284 mg/dL — ABNORMAL HIGH (ref 70–99)
Glucose-Capillary: 237 mg/dL — ABNORMAL HIGH (ref 70–99)

## 2018-10-23 LAB — CBC
HCT: 34.7 % — ABNORMAL LOW (ref 39.0–52.0)
Hemoglobin: 11 g/dL — ABNORMAL LOW (ref 13.0–17.0)
MCH: 28.6 pg (ref 26.0–34.0)
MCHC: 31.7 g/dL (ref 30.0–36.0)
MCV: 90.4 fL (ref 80.0–100.0)
Platelets: 170 10*3/uL (ref 150–400)
RBC: 3.84 MIL/uL — ABNORMAL LOW (ref 4.22–5.81)
RDW: 15 % (ref 11.5–15.5)
WBC: 7.1 10*3/uL (ref 4.0–10.5)
nRBC: 0 % (ref 0.0–0.2)

## 2018-10-23 LAB — BASIC METABOLIC PANEL
Anion gap: 11 (ref 5–15)
BUN: 108 mg/dL — ABNORMAL HIGH (ref 8–23)
CO2: 28 mmol/L (ref 22–32)
Calcium: 8.9 mg/dL (ref 8.9–10.3)
Chloride: 95 mmol/L — ABNORMAL LOW (ref 98–111)
Creatinine, Ser: 2.39 mg/dL — ABNORMAL HIGH (ref 0.61–1.24)
GFR calc Af Amer: 28 mL/min — ABNORMAL LOW (ref >60)
GFR calc non Af Amer: 24 mL/min — ABNORMAL LOW (ref >60)
Glucose, Bld: 245 mg/dL — ABNORMAL HIGH (ref 70–99)
Potassium: 3.5 mmol/L (ref 3.5–5.1)
Sodium: 134 mmol/L — ABNORMAL LOW (ref 135–145)

## 2018-10-23 MED ORDER — TORSEMIDE 20 MG PO TABS
20.0000 mg | ORAL_TABLET | Freq: Every day | ORAL | Status: DC
  Administered 2018-10-23 – 2018-10-25 (×3): 20 mg via ORAL
  Filled 2018-10-23 (×3): qty 1

## 2018-10-23 MED ORDER — INSULIN ASPART 100 UNIT/ML ~~LOC~~ SOLN
4.0000 [IU] | Freq: Once | SUBCUTANEOUS | Status: AC
  Administered 2018-10-23: 4 [IU] via SUBCUTANEOUS

## 2018-10-23 NOTE — Progress Notes (Signed)
PROGRESS NOTE    [name]  QPY:[id number] DOB: [birth year] DOA: 10/22/2018 PCP: [name], MD     Brief Narrative:  85 y.o. male with medical history significant for systolic and diastolic CHF, COPD, ST elevation MI, morbid obesity-BMI 41, hypertension, diabetes mellitus, CKD 3.  Patient presented to the ED with complaints of difficulty breathing of 2 days duration, and worsening of his lower extremity swelling over the past 1 week.  He also tells me his abdomen feels more full than usual.  He also reports some mild nonproductive cough. He is unaware if he has actually been taking his torsemide, or if he is on Lasix. I talked to patient spouse on the phone, she tells me patient has been taking his torsemide every morning, she believes his dose is 20 mg every day, or that he has been alternating 20mg  and 40mg . He denies chest pain.  Patient was admitted improved 4/9 to 4/13 for chest discomfort and symptoms of unstable angina, treated medically.  Also required IV diuresis.  Patient tells me he was subsequently sent to nursing home for 2 weeks and he has been home since then.  ED Course: Blood pressure systolic 124- 580.  Temperature 97.3.  WBC 7.4.  BNP is 83.  Elevated glucose 405.  Elevated creatinine 2.64.  Sodium 132.  Chest x-ray suggested left lower lobe pneumonia with a small left pleural effusion.  High-sensitivity troponin 32 >> 36.  EKG shows ectopic atrial rhythm, difficult changes compared to prior.  Assessment & Plan: 1-left lower lobe pneumonia -Currently afebrile -Reports on nonproductive intermittent coughing spells and is requiring 2.5 L nasal cannula supplementation -Continue current IV antibiotics -follow culture reports -continue weaning oxygen as tolerated. -Follow clinical response.  2-acute on chronic renal failure: Stage III at baseline -Creatinine has trended down appropriately -GFR stabilizing -Will resume oral diuretics -Follow daily weights and  strict I's and O's  3-combined systolic and diastolic heart failure -Chronic and compensated -Continue to follow low-sodium diet and daily weights -Resume oral diuretics -Most recent ejection fraction on echo in April 2020 was 40 to 45%. -Continue metoprolol and hydralazine.  4-history of coronary artery disease/elevated troponin -Patient denies chest pain -Continue monitoring on telemetry -Will continue aspirin and Plavix -Continue home metoprolol.  5-Let great toe wound -Follow wound care recommendations -No significant signs of superimposed infection -current antibiotics for PNA will cover for any development cellulitis.  6-morbid obesity -Body mass index is 40.83 kg/m. -Low calorie diet, portion control and increase physical activity discussed with patient.  7-obstructive sleep apnea -Continue CPAP nightly  8-depression/anxiety -No suicidal ideation or hallucinations currently -Continue home medications.  9-type 2 diabetes mellitus -Most recent A1c 11.5 -Continue Lantus and sliding scale insulin.    DVT prophylaxis: Lovenox Code Status: DNR Family Communication: No family bedside. Disposition Plan: Continue IV antibiotics, follow wound care recommendations, increase physical activity, follow renal function and resume oral diuretics.  Consultants:   Wound Care.  Procedures:   See below for x-ray reports  Antimicrobials:  Anti-infectives (From admission, onward)   Start     Dose/Rate Route Frequency Ordered Stop   10/24/18 1400  vancomycin (VANCOCIN) 1,500 mg in sodium chloride 0.9 % 500 mL IVPB  Status:  Discontinued     1,500 mg 250 mL/hr over 120 Minutes Intravenous Every 48 hours 10/22/18 1422 10/22/18 1734   10/23/18 0300  ceFEPIme (MAXIPIME) 1 g in sodium chloride 0.9 % 100 mL IVPB     1 g 200 mL/hr  over 30 Minutes Intravenous Every 12 hours 10/22/18 1855     10/22/18 1745  azithromycin (ZITHROMAX) 500 mg in sodium chloride 0.9 % 250 mL IVPB      500 mg 250 mL/hr over 60 Minutes Intravenous Every 24 hours 10/22/18 1734     10/22/18 1430  vancomycin (VANCOCIN) IVPB 1000 mg/200 mL premix     1,000 mg 200 mL/hr over 60 Minutes Intravenous Every 1 hr x 2 10/22/18 1421 10/22/18 1701   10/22/18 1400  ceFEPIme (MAXIPIME) 1 g in sodium chloride 0.9 % 100 mL IVPB     1 g 200 mL/hr over 30 Minutes Intravenous  Once 10/22/18 1357 10/22/18 1513       Subjective: Currently afebrile, denies chest pain, no nausea, no vomiting.  Expressed feeling better; but still requiring 2.5 nasal cannula oxygen supplementation and having some shortness of breath on exertion.  Positive intermittent nonproductive cough.  Objective: Vitals:   10/22/18 1948 10/22/18 2022 10/23/18 0500 10/23/18 0511  BP:  139/70  90/64  Pulse:  79  68  Resp:  18  16  Temp:  97.7 F (36.5 C)  98.2 F (36.8 C)  TempSrc:  Oral    SpO2: 93% 95%  96%  Weight:   111.3 kg   Height:        Intake/Output Summary (Last 24 hours) at 10/23/2018 1103 Last data filed at 10/23/2018 0854 Gross per 24 hour  Intake 820 ml  Output 950 ml  Net -130 ml   Filed Weights   10/22/18 1103 10/22/18 1837 10/23/18 0500  Weight: 117.9 kg 111.5 kg 111.3 kg    Examination: General exam: Alert, awake, oriented x 3, no CP, no nausea, no vomiting. Slightly SOB and using 2.5 L [place] supplementation. Respiratory system: fair air movement, no frank crackles, positive scattered rhonchi. Cardiovascular system:RRR. No murmurs, rubs, gallops. Gastrointestinal system: Abdomen is nondistended, soft and nontender. No organomegaly or masses felt. Normal bowel sounds heard. Central nervous system: Alert and oriented. No focal neurological deficits. Extremities: No Cyanosis, no clubbing,  Skin: left great toe with open wound after losing nail; mild drainage, pain on palpation and very little erythema; no swelling, no odor.  Media Information   Media Information    Psychiatry: Judgement and insight appear  normal. Mood & affect appropriate.   Data Reviewed: I have personally reviewed following labs and imaging studies  CBC: Recent Labs  Lab 10/22/18 1125 10/23/18 0455  WBC 7.4 7.1  HGB 12.2* 11.0*  HCT 38.1* 34.7*  MCV 90.1 90.4  PLT 183 235   Basic Metabolic Panel: Recent Labs  Lab 10/22/18 1125 10/23/18 0455  NA 132* 134*  K 3.7 3.5  CL 93* 95*  CO2 28 28  GLUCOSE 405* 245*  BUN 111* 108*  CREATININE 2.64* 2.39*  CALCIUM 9.3 8.9   GFR: Estimated Creatinine Clearance: 27 mL/min (A) (by C-G formula based on SCr of 2.39 mg/dL (H)). Liver Function Tests: Recent Labs  Lab 10/22/18 1125  AST 14*  ALT 12  ALKPHOS 63  BILITOT 0.4  PROT 7.3  ALBUMIN 4.0   No results for input(s): LIPASE, AMYLASE in the last 168 hours. No results for input(s): AMMONIA in the last 168 hours. Coagulation Profile: No results for input(s): INR, PROTIME in the last 168 hours. Cardiac Enzymes: No results for input(s): CKTOTAL, CKMB, CKMBINDEX, TROPONINI in the last 168 hours. BNP (last 3 results) No results for input(s): PROBNP in the last 8760 hours. HbA1C: No results for  input(s): HGBA1C in the last 72 hours. CBG: Recent Labs  Lab 10/22/18 2028 10/23/18 0736  GLUCAP 290* 225*   Lipid Profile: No results for input(s): CHOL, HDL, LDLCALC, TRIG, CHOLHDL, LDLDIRECT in the last 72 hours. Thyroid Function Tests: No results for input(s): TSH, T4TOTAL, FREET4, T3FREE, THYROIDAB in the last 72 hours. Anemia Panel: No results for input(s): VITAMINB12, FOLATE, FERRITIN, TIBC, IRON, RETICCTPCT in the last 72 hours. Urine analysis:    Component Value Date/Time   COLORURINE YELLOW 07/29/2018 0222   APPEARANCEUR HAZY (A) 07/29/2018 0222   LABSPEC 1.011 07/29/2018 0222   PHURINE 6.0 07/29/2018 0222   GLUCOSEU 50 (A) 07/29/2018 0222   HGBUR MODERATE (A) 07/29/2018 0222   BILIRUBINUR NEGATIVE 07/29/2018 0222   KETONESUR NEGATIVE 07/29/2018 0222   PROTEINUR 30 (A) 07/29/2018 0222   NITRITE  NEGATIVE 07/29/2018 0222   LEUKOCYTESUR TRACE (A) 07/29/2018 0222   Sepsis Labs: @LABRCNTIP (procalcitonin:4,lacticidven:4)  ) Recent Results (from the past 240 hour(s))  SARS Coronavirus 2 (CEPHEID - Performed in [place] hospital lab), Hosp Order     Status: None   Collection Time: 10/22/18  1:44 PM   Specimen: Nasopharyngeal Swab  Result Value Ref Range Status   SARS Coronavirus 2 NEGATIVE NEGATIVE Final    Comment: (NOTE) If result is NEGATIVE SARS-CoV-2 target nucleic acids are NOT DETECTED. The SARS-CoV-2 RNA is generally detectable in upper and lower  respiratory specimens during the acute phase of infection. The lowest  concentration of SARS-CoV-2 viral copies this assay can detect is 250  copies / mL. A negative result does not preclude SARS-CoV-2 infection  and should not be used as the sole basis for treatment or other  patient management decisions.  A negative result may occur with  improper specimen collection / handling, submission of specimen other  than nasopharyngeal swab, presence of viral mutation(s) within the  areas targeted by this assay, and inadequate number of viral copies  (<250 copies / mL). A negative result must be combined with clinical  observations, patient history, and epidemiological information. If result is POSITIVE SARS-CoV-2 target nucleic acids are DETECTED. The SARS-CoV-2 RNA is generally detectable in upper and lower  respiratory specimens dur ing the acute phase of infection.  Positive  results are indicative of active infection with SARS-CoV-2.  Clinical  correlation with patient history and other diagnostic information is  necessary to determine patient infection status.  Positive results do  not rule out bacterial infection or co-infection with other viruses. If result is PRESUMPTIVE POSTIVE SARS-CoV-2 nucleic acids MAY BE PRESENT.   A presumptive positive result was obtained on the submitted specimen  and confirmed on repeat  testing.  While 2019 novel coronavirus  (SARS-CoV-2) nucleic acids may be present in the submitted sample  additional confirmatory testing may be necessary for epidemiological  and / or clinical management purposes  to differentiate between  SARS-CoV-2 and other Sarbecovirus currently known to infect humans.  If clinically indicated additional testing with an alternate test  methodology [phone]) is advised. The SARS-CoV-2 RNA is generally  detectable in upper and lower respiratory sp ecimens during the acute  phase of infection. The expected result is Negative. Fact Sheet for Patients:  StrictlyIdeas.no Fact Sheet for Healthcare Providers: BankingDealers.co.za This test is not yet approved or cleared by the [place] FDA and has been authorized for detection and/or diagnosis of SARS-CoV-2 by FDA under an Emergency Use Authorization (EUA).  This EUA will remain in effect (meaning this test can be used)  for the duration of the COVID-19 declaration under Section 564(b)(1) of the Act, 21 U.S.C. section 360bbb-3(b)(1), unless the authorization is terminated or revoked sooner. Performed at [facility], [address], [place], [place] [address]   MRSA PCR Screening     Status: None   Collection Time: 10/22/18  2:23 PM   Specimen: Nasal Mucosa; Nasopharyngeal  Result Value Ref Range Status   MRSA by PCR NEGATIVE NEGATIVE Final    Comment:        The GeneXpert MRSA Assay (FDA approved for NASAL specimens only), is one component of a comprehensive MRSA colonization surveillance program. It is not intended to diagnose MRSA infection nor to guide or monitor treatment for MRSA infections. Performed at [facility], [address], [place], [place] [address]   Culture, blood (Routine X 2) w Reflex to ID Panel     Status: None (Preliminary result)   Collection Time: 10/22/18  2:31 PM   Specimen: BLOOD RIGHT ARM  Result Value Ref Range  Status   Specimen Description   Final    BLOOD RIGHT ARM BOTTLES DRAWN AEROBIC AND ANAEROBIC   Special Requests Blood Culture adequate volume  Final   Culture   Final    NO GROWTH < 24 HOURS Performed at [facility], [address] [place], [place] [address]    Report Status PENDING  Incomplete  Culture, blood (Routine X 2) w Reflex to ID Panel     Status: None (Preliminary result)   Collection Time: 10/22/18  2:42 PM   Specimen: BLOOD LEFT HAND  Result Value Ref Range Status   Specimen Description   Final    BLOOD LEFT HAND BOTTLES DRAWN AEROBIC AND ANAEROBIC   Special Requests   Final    Blood Culture results may not be optimal due to an inadequate volume of blood received in culture bottles   Culture   Final    NO GROWTH < 24 HOURS Performed at [facility], [address] [place], [place] [address]    Report Status PENDING  Incomplete         Radiology Studies: Dg Chest Port 1 View  Result Date: 10/22/2018 CLINICAL DATA:  Shortness of breath EXAM: PORTABLE CHEST 1 VIEW COMPARISON:  July 21, 2018 FINDINGS: There is airspace consolidation in a portion of the left lower lobe with small left pleural effusion. The right lung is clear. There is cardiomegaly with mild pulmonary venous hypertension. No adenopathy. There is aortic atherosclerosis. Patient is status post median sternotomy. No bone lesions. IMPRESSION: Airspace consolidation felt to represent a degree of pneumonia left lower lobe. Small left pleural effusion. Right lung clear. Stable cardiomegaly. Aortic Atherosclerosis (ICD10-I70.0). Electronically Signed   By: Lowella Grip III M.D.   On: 10/22/2018 12:13        Scheduled Meds: . allopurinol  200 mg Oral Daily  . alprazolam  2 mg Oral QHS  . aspirin EC  81 mg Oral q morning - 10a  . chlorhexidine  15 mL Mouth Rinse BID  . clopidogrel  75 mg Oral Daily  . enoxaparin (LOVENOX) injection  30 mg Subcutaneous Q24H  . insulin aspart  0-15 Units Subcutaneous TID  WC  . insulin glargine  60 Units Subcutaneous QHS  . isosorbide mononitrate  30 mg Oral BID  . mouth rinse  15 mL Mouth Rinse q12n4p  . metoprolol tartrate  50 mg Oral BID  . tamsulosin  0.4 mg Oral QPM   Continuous Infusions: . azithromycin  500 mg (10/22/18 1835)  . ceFEPime (MAXIPIME) IV 1 g (10/23/18 0834)     LOS: 1 day    Time spent: 30 minutes.   Barton Dubois, MD [facility] Pager [phone]   10/23/2018, 11:03 AM

## 2018-10-23 NOTE — Consult Note (Signed)
[facility] Nurse wound consult note Reason for Consult: Left great toe with nail avulsion Wound type:trauma Pressure Injury POA: N/A Measurement:N/A Wound [id number] obscured by the presence of dried serum (scab) Drainage (amount, consistency, odor) small amount light yellow  Periwound: intact, dry Dressing procedure/placement/frequency: I will provide Nursing with guidance for topical care one daily using xeroform gauze (astringent plus antimicrobial properties) secured with conform bandaging/paper tape. Daily cleanse prior to wound care using soap and water will remove residual dried exudate and promote further healing. Patient to follow up with PCP on this post discharge along with the other comorbid conditions contributing to this admission.  [place] nursing team will not follow, but will remain available to this patient, the nursing and medical teams.  Please re-consult if needed. Thanks, Maudie Flakes, MSN, RN, [place], Arther Abbott  Pager# [phone]

## 2018-10-24 LAB — BASIC METABOLIC PANEL
Anion gap: 13 (ref 5–15)
BUN: 102 mg/dL — ABNORMAL HIGH (ref 8–23)
CO2: 28 mmol/L (ref 22–32)
Calcium: 8.8 mg/dL — ABNORMAL LOW (ref 8.9–10.3)
Chloride: 94 mmol/L — ABNORMAL LOW (ref 98–111)
Creatinine, Ser: 2.36 mg/dL — ABNORMAL HIGH (ref 0.61–1.24)
GFR calc Af Amer: 28 mL/min — ABNORMAL LOW (ref >60)
GFR calc non Af Amer: 25 mL/min — ABNORMAL LOW (ref >60)
Glucose, Bld: 175 mg/dL — ABNORMAL HIGH (ref 70–99)
Potassium: 3.4 mmol/L — ABNORMAL LOW (ref 3.5–5.1)
Sodium: 135 mmol/L (ref 135–145)

## 2018-10-24 LAB — GLUCOSE, CAPILLARY
Glucose-Capillary: 302 mg/dL — ABNORMAL HIGH (ref 70–99)
Glucose-Capillary: 274 mg/dL — ABNORMAL HIGH (ref 70–99)
Glucose-Capillary: 155 mg/dL — ABNORMAL HIGH (ref 70–99)
Glucose-Capillary: 261 mg/dL — ABNORMAL HIGH (ref 70–99)
Glucose-Capillary: 236 mg/dL — ABNORMAL HIGH (ref 70–99)
Glucose-Capillary: 262 mg/dL — ABNORMAL HIGH (ref 70–99)

## 2018-10-24 MED ORDER — SODIUM CHLORIDE 0.9 % IV SOLN
2.0000 g | INTRAVENOUS | Status: DC
  Administered 2018-10-24: 09:00:00 2 g via INTRAVENOUS
  Filled 2018-10-24: qty 2

## 2018-10-24 MED ORDER — AMOXICILLIN-POT CLAVULANATE 500-125 MG PO TABS
1.0000 | ORAL_TABLET | Freq: Two times a day (BID) | ORAL | Status: DC
  Administered 2018-10-24 – 2018-10-25 (×2): 500 mg via ORAL
  Filled 2018-10-24 (×2): qty 1

## 2018-10-24 NOTE — Care Management Important Message (Signed)
Important Message  Patient Details  Name: [name] MRN: [id number] Date of Birth: 02-Jun-1933   Medicare Important Message Given:  Yes     Tommy Medal 10/24/2018, 11:39 AM

## 2018-10-24 NOTE — Progress Notes (Signed)
Pt refusing use of CPAP.  RT will continue to monitor.

## 2018-10-24 NOTE — Progress Notes (Addendum)
PROGRESS NOTE    [name]  ZHY:[id number] DOB: 1933/06/06 DOA: 10/22/2018 PCP: [name], MD     Brief Narrative:  85 y.o. male with medical history significant for systolic and diastolic CHF, COPD, ST elevation MI, morbid obesity-BMI 41, hypertension, diabetes mellitus, CKD 3.  Patient presented to the ED with complaints of difficulty breathing of 2 days duration, and worsening of his lower extremity swelling over the past 1 week.  He also tells me his abdomen feels more full than usual.  He also reports some mild nonproductive cough. He is unaware if he has actually been taking his torsemide, or if he is on Lasix. I talked to patient spouse on the phone, she tells me patient has been taking his torsemide every morning, she believes his dose is 20 mg every day, or that he has been alternating 20mg  and 40mg . He denies chest pain.  Patient was admitted improved 4/9 to 4/13 for chest discomfort and symptoms of unstable angina, treated medically.  Also required IV diuresis.  Patient tells me he was subsequently sent to nursing home for 2 weeks and he has been home since then.  ED Course: Blood pressure systolic 295- 284.  Temperature 97.3.  WBC 7.4.  BNP is 83.  Elevated glucose 405.  Elevated creatinine 2.64.  Sodium 132.  Chest x-ray suggested left lower lobe pneumonia with a small left pleural effusion.  High-sensitivity troponin 32 >> 36.  EKG shows ectopic atrial rhythm, difficult changes compared to prior.  Assessment & Plan: 1-left lower lobe pneumonia -Currently afebrile -Reports on nonproductive intermittent coughing spells and is requiring 2.5 L nasal cannula supplementation -Continue antibiotics, but will transition to oral regimen to assess tolerance. -follow culture reports -continue weaning oxygen as tolerated to baseline (2 L nasal cannula chronically).. -Follow clinical response.  2-acute on chronic renal failure: Stage III at baseline -Creatinine has trended  down appropriately; down to 2.3 currently. -GFR stabilizing and close to baseline -Will continue oral diuretics and follow renal function trend. -Follow daily weights and strict I's and O's  3-combined systolic and diastolic heart failure -Chronic and compensated -Continue to follow low-sodium diet and daily weights -Resume oral diuretics -Most recent ejection fraction on echo in April 2020 was 40 to 45%. -Continue metoprolol and hydralazine.  4-history of coronary artery disease/elevated troponin -Patient denies chest pain -Continue monitoring on telemetry -Will continue aspirin and Plavix -Continue home metoprolol.  5-Let great toe wound -Follow wound care recommendations -No significant signs of superimposed infection -current antibiotics for PNA will cover for any development cellulitis.  6-morbid obesity -Body mass index is 40.8 kg/m. -Low calorie diet, portion control and increase physical activity discussed with patient.  7-obstructive sleep apnea -Continue CPAP nightly  8-depression/anxiety -No suicidal ideation or hallucinations currently -Continue home medications.  9-type 2 diabetes mellitus -Most recent A1c 11.5 -Continue Lantus and sliding scale insulin.  10-physical deconditioning -Ask for PT evaluation  11-stage II pressure injury -Continue constant repositioning -Follow preventive measures  DVT prophylaxis: Lovenox Code Status: DNR Family Communication: No family bedside. Disposition Plan: Continue antibiotics, but transition to oral regimen; follow wound care recommendations, increase physical activity, follow renal function in a.m. and as physical therapy evaluation.  Consultants:   Wound Care.  Procedures:   See below for x-ray reports  Antimicrobials:  Anti-infectives (From admission, onward)   Start     Dose/Rate Route Frequency Ordered Stop   10/24/18 2200  amoxicillin-clavulanate (AUGMENTIN) 500-125 MG per tablet 500 mg  1  tablet Oral Every 12 hours 10/24/18 1049     10/24/18 1400  vancomycin (VANCOCIN) 1,500 mg in sodium chloride 0.9 % 500 mL IVPB  Status:  Discontinued     1,500 mg 250 mL/hr over 120 Minutes Intravenous Every 48 hours 10/22/18 1422 10/22/18 1734   10/24/18 1000  ceFEPIme (MAXIPIME) 2 g in sodium chloride 0.9 % 100 mL IVPB  Status:  Discontinued     2 g 200 mL/hr over 30 Minutes Intravenous Every 24 hours 10/24/18 0752 10/24/18 1049   10/23/18 0300  ceFEPIme (MAXIPIME) 1 g in sodium chloride 0.9 % 100 mL IVPB  Status:  Discontinued     1 g 200 mL/hr over 30 Minutes Intravenous Every 12 hours 10/22/18 1855 10/24/18 0752   10/22/18 1745  azithromycin (ZITHROMAX) 500 mg in sodium chloride 0.9 % 250 mL IVPB  Status:  Discontinued     500 mg 250 mL/hr over 60 Minutes Intravenous Every 24 hours 10/22/18 1734 10/24/18 1049   10/22/18 1430  vancomycin (VANCOCIN) IVPB 1000 mg/200 mL premix     1,000 mg 200 mL/hr over 60 Minutes Intravenous Every 1 hr x 2 10/22/18 1421 10/22/18 1701   10/22/18 1400  ceFEPIme (MAXIPIME) 1 g in sodium chloride 0.9 % 100 mL IVPB     1 g 200 mL/hr over 30 Minutes Intravenous  Once 10/22/18 1357 10/22/18 1513       Subjective: No fever, no chest pain, no nausea, no vomiting.  Patient reports breathing continued to improve even is still short of breath on exertion.  He is feeling weak and deconditioned.  Objective: Vitals:   10/24/18 0500 10/24/18 0525 10/24/18 0823 10/24/18 1312  BP:  (!) 104/44  (!) 113/59  Pulse:  (!) 53  75  Resp:  18  16  Temp:  98.3 F (36.8 C)  98.4 F (36.9 C)  TempSrc:  Oral  Oral  SpO2:  94% 95% 94%  Weight: 111.2 kg     Height:        Intake/Output Summary (Last 24 hours) at 10/24/2018 1811 Last data filed at 10/24/2018 1807 Gross per 24 hour  Intake 720 ml  Output 1800 ml  Net -1080 ml   Filed Weights   10/22/18 1837 10/23/18 0500 10/24/18 0500  Weight: 111.5 kg 111.3 kg 111.2 kg    Examination: General exam: Alert,  awake, oriented x 3; reports breathing is improving.  No chest pain, no nausea, no vomiting.  Still short of breath on exertion, weak and deconditioned.  Patient using 2-2.5 L nasal cannula supplementation. Respiratory system: No frank crackles, positive rhonchi right, improved air movement bilaterally. Cardiovascular system:RRR. No murmurs, rubs, gallops. Gastrointestinal system: Abdomen is nondistended, soft and nontender. No organomegaly or masses felt. Normal bowel sounds heard. Central nervous system: Alert and oriented. No focal neurological deficits. Extremities: No cyanosis or clubbing.  Trace to 1+ edema bilaterally. Skin: No rashes, no petechiae.  Left great toe with clean dressings in place.  No apparent acute drainage appreciated.  Stage II pressure injury on his right buttocks, present at time of admission.  No signs of superimposed infection. Psychiatry: Judgement and insight appear normal. Mood & affect appropriate.   Data Reviewed: I have personally reviewed following labs and imaging studies  CBC: Recent Labs  Lab 10/22/18 1125 10/23/18 0455  WBC 7.4 7.1  HGB 12.2* 11.0*  HCT 38.1* 34.7*  MCV 90.1 90.4  PLT 183 101   Basic Metabolic Panel: Recent Labs  Lab 10/22/18 1125 10/23/18 0455 10/24/18 0510  NA 132* 134* 135  K 3.7 3.5 3.4*  CL 93* 95* 94*  CO2 28 28 28   GLUCOSE 405* 245* 175*  BUN 111* 108* 102*  CREATININE 2.64* 2.39* 2.36*  CALCIUM 9.3 8.9 8.8*   GFR: Estimated Creatinine Clearance: 27.3 mL/min (A) (by C-G formula based on SCr of 2.36 mg/dL (H)).   Liver Function Tests: Recent Labs  Lab 10/22/18 1125  AST 14*  ALT 12  ALKPHOS 63  BILITOT 0.4  PROT 7.3  ALBUMIN 4.0   CBG: Recent Labs  Lab 10/23/18 1628 10/23/18 2122 10/24/18 0724 10/24/18 1132 10/24/18 1558  GLUCAP 284* 237* 155* 261* 236*   Urine analysis:    Component Value Date/Time   COLORURINE YELLOW 07/29/2018 0222   APPEARANCEUR HAZY (A) 07/29/2018 0222   LABSPEC 1.011  07/29/2018 0222   PHURINE 6.0 07/29/2018 0222   GLUCOSEU 50 (A) 07/29/2018 0222   HGBUR MODERATE (A) 07/29/2018 0222   BILIRUBINUR NEGATIVE 07/29/2018 0222   KETONESUR NEGATIVE 07/29/2018 0222   PROTEINUR 30 (A) 07/29/2018 0222   NITRITE NEGATIVE 07/29/2018 0222   LEUKOCYTESUR TRACE (A) 07/29/2018 0222    Recent Results (from the past 240 hour(s))  SARS Coronavirus 2 (CEPHEID - Performed in [place] hospital lab), Hosp Order     Status: None   Collection Time: 10/22/18  1:44 PM   Specimen: Nasopharyngeal Swab  Result Value Ref Range Status   SARS Coronavirus 2 NEGATIVE NEGATIVE Final    Comment: (NOTE) If result is NEGATIVE SARS-CoV-2 target nucleic acids are NOT DETECTED. The SARS-CoV-2 RNA is generally detectable in upper and lower  respiratory specimens during the acute phase of infection. The lowest  concentration of SARS-CoV-2 viral copies this assay can detect is 250  copies / mL. A negative result does not preclude SARS-CoV-2 infection  and should not be used as the sole basis for treatment or other  patient management decisions.  A negative result may occur with  improper specimen collection / handling, submission of specimen other  than nasopharyngeal swab, presence of viral mutation(s) within the  areas targeted by this assay, and inadequate number of viral copies  (<250 copies / mL). A negative result must be combined with clinical  observations, patient history, and epidemiological information. If result is POSITIVE SARS-CoV-2 target nucleic acids are DETECTED. The SARS-CoV-2 RNA is generally detectable in upper and lower  respiratory specimens dur ing the acute phase of infection.  Positive  results are indicative of active infection with SARS-CoV-2.  Clinical  correlation with patient history and other diagnostic information is  necessary to determine patient infection status.  Positive results do  not rule out bacterial infection or co-infection with other  viruses. If result is PRESUMPTIVE POSTIVE SARS-CoV-2 nucleic acids MAY BE PRESENT.   A presumptive positive result was obtained on the submitted specimen  and confirmed on repeat testing.  While 2019 novel coronavirus  (SARS-CoV-2) nucleic acids may be present in the submitted sample  additional confirmatory testing may be necessary for epidemiological  and / or clinical management purposes  to differentiate between  SARS-CoV-2 and other Sarbecovirus currently known to infect humans.  If clinically indicated additional testing with an alternate test  methodology [phone]) is advised. The SARS-CoV-2 RNA is generally  detectable in upper and lower respiratory sp ecimens during the acute  phase of infection. The expected result is Negative. Fact Sheet for Patients:  StrictlyIdeas.no Fact Sheet for Healthcare Providers: BankingDealers.co.za  This test is not yet approved or cleared by the [place] and has been authorized for detection and/or diagnosis of SARS-CoV-2 by FDA under an Emergency Use Authorization (EUA).  This EUA will remain in effect (meaning this test can be used) for the duration of the COVID-19 declaration under Section 564(b)(1) of the Act, 21 U.S.C. section 360bbb-3(b)(1), unless the authorization is terminated or revoked sooner. Performed at [facility], [address] [place], [place] [address]   MRSA PCR Screening     Status: None   Collection Time: 10/22/18  2:23 PM   Specimen: Nasal Mucosa; Nasopharyngeal  Result Value Ref Range Status   MRSA by PCR NEGATIVE NEGATIVE Final    Comment:        The GeneXpert MRSA Assay (FDA approved for NASAL specimens only), is one component of a comprehensive MRSA colonization surveillance program. It is not intended to diagnose MRSA infection nor to guide or monitor treatment for MRSA infections. Performed at [facility], [address], [place], [place] [address]     Culture, blood (Routine X 2) w Reflex to ID Panel     Status: None (Preliminary result)   Collection Time: 10/22/18  2:31 PM   Specimen: BLOOD RIGHT ARM  Result Value Ref Range Status   Specimen Description   Final    BLOOD RIGHT ARM BOTTLES DRAWN AEROBIC AND ANAEROBIC   Special Requests Blood Culture adequate volume  Final   Culture   Final    NO GROWTH < 24 HOURS Performed at [facility], [address] [place], [place] [address]    Report Status PENDING  Incomplete  Culture, blood (Routine X 2) w Reflex to ID Panel     Status: None (Preliminary result)   Collection Time: 10/22/18  2:42 PM   Specimen: BLOOD LEFT HAND  Result Value Ref Range Status   Specimen Description   Final    BLOOD LEFT HAND BOTTLES DRAWN AEROBIC AND ANAEROBIC   Special Requests   Final    Blood Culture results may not be optimal due to an inadequate volume of blood received in culture bottles   Culture   Final    NO GROWTH < 24 HOURS Performed at [facility], [address], [place],  [address]    Report Status PENDING  Incomplete     Scheduled Meds:  allopurinol  200 mg Oral Daily   alprazolam  2 mg Oral QHS   amoxicillin-clavulanate  1 tablet Oral Q12H   aspirin EC  81 mg Oral q morning - 10a   chlorhexidine  15 mL Mouth Rinse BID   clopidogrel  75 mg Oral Daily   enoxaparin (LOVENOX) injection  30 mg Subcutaneous Q24H   insulin aspart  0-15 Units Subcutaneous TID WC   insulin glargine  60 Units Subcutaneous QHS   isosorbide mononitrate  30 mg Oral BID   mouth rinse  15 mL Mouth Rinse q12n4p   metoprolol tartrate  50 mg Oral BID   tamsulosin  0.4 mg Oral QPM   torsemide  20 mg Oral Daily   Continuous Infusions:    LOS: 2 days    Time spent: 30 minutes.   Barton Dubois, MD [facility] Pager [phone]   10/24/2018, 6:11 PM

## 2018-10-24 NOTE — TOC Initial Note (Addendum)
Transition of Care [facility]) - Initial/Assessment Note    Patient Details  Name: [name] MRN: [id number] Date of Birth: 02/24/1934  Transition of Care [facility]) CM/SW Contact:    Shade Flood, LCSW Phone Number: 10/24/2018, 3:52 PM  Clinical Narrative:                   Pt admitted from home. He is high risk for readmission. Met with pt at bedside today. Pt is alert and oriented. He states that he lives with his wife at home. Per pt, his wife does help him with some of his ADLs at home. Pt states that he uses a walker for ambulation. He has Oxygen for night time use. Pt states that his son takes him to appointments. Per pt, he does not have any difficulty obtaining medications.   Pt reports that he has had HH in the past and he has also been at [facility] for rehab. Pt is eager to go home. He is agreeable to [facility] RN but states that he really doesn't want PT. Awaiting hospital PT recommendations at this time. Spoke with pt's wife by phone to confirm [facility] agency previously following pt. She reports it was [facility] and they would like [facility] again. Referral given to [facility] at [place].   Referral sent to [facility] CM for follow up related to CHF and high re-admission risk.  MD anticipating possible dc tomorrow. TOC will follow up to further assist with dc needs.  Expected Discharge Plan: [place] Barriers to Discharge: Continued Medical Work up   Patient Goals and CMS Choice Patient states their goals for this hospitalization and ongoing recovery are:: Return home CMS Medicare.gov Compare Post Acute Care list provided to:: Patient Choice offered to / list presented to : Patient  Expected Discharge Plan and Services Expected Discharge Plan: [place] In-house Referral: Clinical Social Work   Post Acute Care Choice: [place] arrangements for the past 2 months: [place]: RN [place] Agency: [place] ([place]) Date [place]: 10/24/18 Time [place]: [place] Representative spoke with at [place]: [place] Arrangements/Services Living arrangements for the past 2 months: [place] with:: Spouse Patient language and need for interpreter reviewed:: Yes Do you feel safe going back to the place where you live?: Yes      Need for Family Participation in Patient Care: Yes (Comment) Care giver support system in place?: Yes (comment)   Criminal Activity/Legal Involvement Pertinent to Current Situation/Hospitalization: No - Comment as needed  Activities of Daily Living Home Assistive Devices/Equipment: CPAP, Dentures (specify type), Oxygen, Walker (specify type), Other (Comment)(diabetic testing supplies ) ADL Screening (condition at time of admission) Patient's cognitive ability adequate to safely complete daily activities?: Yes Is the patient deaf or have difficulty hearing?: No Does the patient have difficulty seeing, even when wearing glasses/contacts?: No Does the patient have difficulty concentrating, remembering, or making decisions?: No Patient able to express need for assistance with ADLs?: Yes Does the patient have difficulty dressing or bathing?: Yes Independently performs ADLs?: No Communication: Independent Dressing (OT): Needs assistance Is this a change from baseline?: Pre-admission baseline Grooming: Independent Feeding: Independent Bathing: Independent with device (comment) Toileting: Independent In/Out Bed: Independent with device (comment) Walks in Home: Independent  with device (comment)(uses walker ) Does the patient have difficulty walking or climbing stairs?: Yes Weakness of Legs: Both Weakness of Arms/Hands: None  Permission Sought/Granted Permission sought to share information with : Facility Art therapist granted to share information with : Yes, Verbal Permission Granted      Permission granted to share info w AGENCY: [facility]        Emotional Assessment Appearance:: Appears stated age Attitude/Demeanor/Rapport: Engaged Affect (typically observed): Anxious, Pleasant Orientation: : Oriented to Self, Oriented to Place, Oriented to  Time, Oriented to Situation Alcohol / Substance Use: Not Applicable Psych Involvement: No (comment)  Admission diagnosis:  Leg swelling [M79.89] Hypoxia [R09.02] Elevated troponin [R79.89] AKI (acute kidney injury) ([place]) [N17.9] HCAP (healthcare-associated pneumonia) [J18.9] Bilateral lower extremity edema [R60.0] Patient Active Problem List   Diagnosis Date Noted  . Pneumonia 10/22/2018  . Pressure injury of skin 10/22/2018  . E. coli UTI 08/04/2018  . CKD stage 3 due to type 2 diabetes mellitus ([place]) 07/27/2018  . Chronic constipation 07/27/2018  . COPD (chronic obstructive pulmonary disease) ([place]) 07/27/2018  . BPH without urinary obstruction 07/27/2018  . Chronic gout due to renal impairment without tophus 07/27/2018  . Depression with anxiety 07/27/2018  . Mild aortic stenosis 07/27/2018  . Unstable angina ([place]) 07/22/2018  . Demand ischemia ([place])   . Secondary cardiomyopathy ([place])   . Chest pain 07/21/2018  . Elevated troponin 07/21/2018  . Acute on chronic diastolic heart failure ([place])   . CKD (chronic kidney disease) stage 3, GFR 30-59 ml/min (HCC) 01/01/2014  . Chronic combined systolic and diastolic CHF (congestive heart failure) ([place]) 08/11/2012  . CAD (coronary artery disease) of artery bypass graft 08/08/2012  . Sleep apnea   . Essential hypertension, benign   . Obesity, Class III, BMI 40-49.9 (morbid obesity) ([place])   . Hyperlipidemia   . Type 2 diabetes mellitus with stage 3 chronic kidney disease, with long-term current use of insulin ([place])   . Coronary artery disease involving native coronary artery of native heart with unstable angina pectoris ([place])    PCP:  [name], MD Pharmacy:    [facility] [place], [place] - [address] AT [address] [place] [address] Phone: [phone] Fax: [phone]  [facility] [phone] - [place], [place] - [address] AT [address] [place] [address] Phone: [phone] Fax: [phone], [place] [address] Phone: [phone] Fax: [phone]     Social Determinants of Health (SDOH) Interventions    Readmission Risk Interventions Readmission Risk Prevention Plan 10/24/2018  Transportation Screening Complete  HRI or [place] Complete  Social Work Consult for [place] Planning/Counseling Complete  Palliative Care Screening Not Applicable  Medication Review Press photographer) Complete  Some recent data might be hidden

## 2018-10-24 NOTE — Progress Notes (Signed)
Pharmacy Antibiotic Note  [name] is a 85 y.o. male admitted on 10/22/2018 with pneumonia.  Pharmacy has been consulted for vancomycin dosing.  Plan: Cefepime 2gm iv q24h F/U cxs and clinical progress  Monitor V/S, labs   Height: 5\' 5"  (165.1 cm) Weight: 245 lb 2.4 oz (111.2 kg) IBW/kg (Calculated) : 61.5  Temp (24hrs), Avg:98.1 F (36.7 C), Min:97.6 F (36.4 C), Max:98.4 F (36.9 C)  Recent Labs  Lab 10/22/18 1125 10/22/18 1432 10/23/18 0455 10/24/18 0510  WBC 7.4  --  7.1  --   CREATININE 2.64*  --  2.39* 2.36*  LATICACIDVEN  --  1.7  --   --     Estimated Creatinine Clearance: 27.3 mL/min (A) (by C-G formula based on SCr of 2.36 mg/dL (H)).    Allergies  Allergen Reactions  . Zocor [Simvastatin] Other (See Comments)    fatigue  . Lipitor [Atorvastatin] Other (See Comments)    Muscle cramping    Antimicrobials this admission: 7/11 vancomycin >> 7/11 7/11 cefepime >  Dose change:  7/13 Cefepime 2gm IV q24h  Microbiology results: 7/11 BCx: ngtd 7/11 MRSA PCR: negative  7/11 Covid 19 negative  Thank you for allowing pharmacy to be a part of this patient's care.  [name], BS Pharm D, [place] Clinical Pharmacist Pager [phone] 10/24/2018 7:47 AM

## 2018-10-24 NOTE — Progress Notes (Signed)
Inpatient Diabetes Program Recommendations  AACE/ADA: New Consensus Statement on Inpatient Glycemic Control  Target Ranges:  Prepandial:   less than 140 mg/dL      Peak postprandial:   less than 180 mg/dL (1-2 hours)      Critically ill patients:  140 - 180 mg/dL  Results for LAVERT, MATOUSEK (MRN [id number]) as of 10/24/2018 11:24  Ref. Range 10/23/2018 07:36 10/23/2018 11:12 10/23/2018 16:28 10/23/2018 21:22 10/24/2018 07:24  Glucose-Capillary Latest Ref Range: 70 - 99 mg/dL 225 (H) 303 (H) 284 (H) 237 (H) 155 (H)    Review of Glycemic Control  Diabetes history: Dm2 Outpatient Diabetes medications: Toujeo 60 units QAM, Novolog 7 units TID with meals Current orders for Inpatient glycemic control: Lantus 60 units QHS, Novolog 0-15 units TID with meals  Inpatient Diabetes Program Recommendations:   Correction (SSI): Please consider ordering Novolog 0-5 units QHS for bedtime correction.  Insulin - Meal Coverage: Please consider ordering Novolog 5 units TID with meals if patient eats at least 50% of meals.  Thanks, Barnie Alderman, RN, MSN, CDE Diabetes Coordinator Inpatient Diabetes Program [phone] (Team Pager from 8am to 5pm)

## 2018-10-25 DIAGNOSIS — R600 Localized edema: Secondary | ICD-10-CM

## 2018-10-25 DIAGNOSIS — J189 Pneumonia, unspecified organism: Principal | ICD-10-CM

## 2018-10-25 DIAGNOSIS — L89312 Pressure ulcer of right buttock, stage 2: Secondary | ICD-10-CM

## 2018-10-25 DIAGNOSIS — N179 Acute kidney failure, unspecified: Secondary | ICD-10-CM

## 2018-10-25 DIAGNOSIS — N183 Chronic kidney disease, stage 3 (moderate): Secondary | ICD-10-CM

## 2018-10-25 DIAGNOSIS — E1122 Type 2 diabetes mellitus with diabetic chronic kidney disease: Secondary | ICD-10-CM

## 2018-10-25 LAB — GLUCOSE, CAPILLARY
Glucose-Capillary: 164 mg/dL — ABNORMAL HIGH (ref 70–99)
Glucose-Capillary: 279 mg/dL — ABNORMAL HIGH (ref 70–99)
Glucose-Capillary: 285 mg/dL — ABNORMAL HIGH (ref 70–99)

## 2018-10-25 MED ORDER — TORSEMIDE 20 MG PO TABS: 20.0000 mg | ORAL_TABLET | Freq: Every day | ORAL | 1 refills | Status: DC

## 2018-10-25 MED ORDER — TRAZODONE HCL 50 MG PO TABS
50.0000 mg | ORAL_TABLET | Freq: Once | ORAL | Status: AC
  Administered 2018-10-25: 02:00:00 50 mg via ORAL
  Filled 2018-10-25: qty 1

## 2018-10-25 MED ORDER — AMOXICILLIN-POT CLAVULANATE 500-125 MG PO TABS: 1.0000 | ORAL_TABLET | Freq: Two times a day (BID) | ORAL | 0 refills | Status: AC

## 2018-10-25 NOTE — Discharge Summary (Signed)
Physician Discharge Summary  [name] YJE:[id number] DOB: Apr 05, 1934 DOA: 10/22/2018  PCP: [name], MD  Admit date: 10/22/2018 Discharge date: 10/25/2018  Time spent: 35 minutes  Recommendations for Outpatient Follow-up:  1. Repeat basic metabolic panel to follow electrolytes and renal function 2. Repeat chest x-ray in 6 weeks to assure complete resolution of infiltrates 3. Continue close monitoring of patient CBGs/A1c with further adjustment of hypoglycemic regimen as needed.   Discharge Diagnoses:  Active Problems:   CKD stage 3 secondary to diabetes ([name])   HCAP (healthcare-associated pneumonia)   Pressure injury of skin   AKI (acute kidney injury) ([place])   Bilateral lower extremity edema Chronic respiratory failure Chronic combined systolic and diastolic heart failure Depression  Discharge Condition: Stable and improved.  Patient discharged home with arranged home health services and instruction to follow-up with PCP in 10 days.  Diet recommendation: Heart healthy and modified carbohydrate diet.  Filed Weights   10/23/18 0500 10/24/18 0500 10/25/18 0702  Weight: 111.3 kg 111.2 kg 109.3 kg    History of present illness:  As per H&P written by Dr. [name] on 10/22/2018 85 y.o.malewith medical history significant forsystolic and diastolic CHF, COPD, ST elevation MI, morbid obesity-BMI 43, hypertension, diabetes mellitus, CKD 3. Patient presented to the ED with complaints of difficulty breathing of 2 days duration, and worsening of his lower extremity swelling over the past 1 week. He also tells me his abdomen feels more fullthan usual. He also reports some mild nonproductive cough. He is unaware if he has actually been taking his torsemide,or if he is on Lasix. I talked to patient spouse on the phone, she tells me patient has been taking his torsemide every morning,she believes his dose is20 mg every day, or that he has been alternating 20mg  and 40mg . He  denies chest pain.  Patient was admitted improved 4/9 to 4/13 for chest discomfort and symptoms of unstable angina, treated medically. Also required IV diuresis. Patient tells me hewas subsequently sent to nursing home for 2 weeks and he has been home since then.  ED Course:Blood pressure systolic106- 637.Temperature 97.3.WBC 7.4. BNP is 83. Elevated glucose 405. Elevated creatinine 2.64.Sodium 132. Chest x-ray suggested left lower lobe pneumonia with a small left pleural effusion. High-sensitivity troponin 32 >> 36.EKG shows ectopic atrial rhythm, difficult changes compared to prior.  Hospital Course:  1-left lower lobe pneumonia -Currently afebrile and with normal WBCs. -Reports significant improvement in his nonproductive intermittent coughing spells and is back to baseline 2 L nasal cannula supplementation.   -Continue antibiotics, using Augmentin by mouth at time of discharge.  Dose adjusted for his renal function.  Instructed to continue using flutter valve. -No growth on his blood culture by time of discharge -Recommend repeat chest x-ray in 6 weeks to follow complete resolution of infiltrates.  2-acute on chronic renal failure: Stage III at baseline -Creatinine has trended down appropriately; down to 2.3 currently. -GFR stabilizing and close to baseline at time of discharge. -Will  recommend repeat basic metabolic panel follow-up visit to reassess electrolytes and renal function trend.  3-combined systolic and diastolic heart failure -Chronic and compensated -Continue to follow low-sodium diet and daily weights -Resume oral diuretics; Demadex 20 mg daily -Most recent ejection fraction on echo in April 2020 was 40 to 45%. -Continue metoprolol and hydralazine.  4-history of coronary artery disease/elevated troponin -Patient denies chest pain -Continue monitoring on telemetry -Will continue aspirin and Plavix -Continue home metoprolol.  5-Let great toe  wound -Follow  wound care recommendations -No significant signs of superimposed infection or active drainage. -current antibiotics for PNA will cover for any infection development.  6-morbid obesity -Body mass index is 40.8 kg/m. -Low calorie diet, portion control and increase physical activity discussed with patient.  7-obstructive sleep apnea -Continue CPAP nightly  8-depression/anxiety -No suicidal ideation or hallucinations currently -Continue home medications.  9-type 2 diabetes mellitus -Most recent A1c 11.5 -Continue Lantus and sliding scale insulin. -Modified carbohydrate diet has been encouraged; patient will require further adjustment on hypoglycemic regimen by PCP after discharge.  10-physical deconditioning -Evaluated by physical therapy and recommendations given for home health PT. -Home health services arranged at discharge.  11-stage II pressure injury -Continue constant repositioning and preventive measures.  12-chronic respiratory failure with hypoxia -Continue oxygen supplementation; 2 L continuously -Continue as mentioned above the use of CPAP at nighttime.  Procedures:  See below for x-ray reports.  Consultations:  None  Discharge Exam: Vitals:   10/25/18 0702 10/25/18 1403  BP: (!) 148/69 137/75  Pulse: 68 72  Resp: 17 18  Temp: 98.2 F (36.8 C) 98.1 F (36.7 C)  SpO2: 94% 95%   General exam: Alert, awake, oriented x 3; reports breathing has continue improving.  No chest pain, no nausea, no vomiting.  Still feeling slightly short of breath on exertion, weak and deconditioned; but wants to be discharged home.  Patient back to his chronic 2 L nasal cannula supplementation.  Afebrile.   Respiratory system: No frank crackles, positive scattered rhonchi, improved air movement bilaterally.  No using accessory muscles. Cardiovascular system:RRR. No murmurs, rubs, gallops. Gastrointestinal system: Abdomen is nondistended, soft and nontender. No  organomegaly or masses felt. Normal bowel sounds heard. Central nervous system: Alert and oriented. No focal neurological deficits. Extremities: No cyanosis or clubbing.  Trace to 1+ edema bilaterally. Skin: No rashes, no petechiae.  Left great toe with clean dressings in place.  No apparent acute drainage appreciated.  Stage II pressure injury on his right buttocks, present at time of admission.  No signs of superimposed infection. Psychiatry: Judgement and insight appear normal. Mood & affect appropriate.   Discharge Instructions   Discharge Instructions    (HEART FAILURE PATIENTS) Call MD:  Anytime you have any of the following symptoms: 1) 3 pound weight gain in 24 hours or 5 pounds in 1 week 2) shortness of breath, with or without a dry hacking cough 3) swelling in the hands, feet or stomach 4) if you have to sleep on extra pillows at night in order to breathe.   Complete by: As directed    Diet - low sodium heart healthy   Complete by: As directed    Discharge instructions   Complete by: As directed    Take medications as prescribed Follow heart healthy/low-sodium diet Maintain adequate hydration Arrange follow-up with PCP in 10 days     Allergies as of 10/25/2018      Reactions   Zocor [simvastatin] Other (See Comments)   fatigue   Lipitor [atorvastatin] Other (See Comments)   Muscle cramping      Medication List    TAKE these medications   acetaminophen 325 MG tablet Commonly known as: TYLENOL Take 650 mg by mouth every 4 (four) hours as needed.   allopurinol 100 MG tablet Commonly known as: ZYLOPRIM Take 2 tablets (200 mg total) by mouth daily.   alprazolam 2 MG tablet Commonly known as: XANAX Take 1 tablet by mouth 2 (two) times a day.   amoxicillin-clavulanate  500-125 MG tablet Commonly known as: AUGMENTIN Take 1 tablet (500 mg total) by mouth every 12 (twelve) hours for 5 days.   aspirin EC 81 MG tablet Take 81 mg by mouth every morning.   clopidogrel 75  MG tablet Commonly known as: PLAVIX take 1 tablet by mouth once daily WITH BREAKFAST   hydrALAZINE 10 MG tablet Commonly known as: APRESOLINE Take 1 tablet (10 mg total) by mouth every 8 (eight) hours.   insulin aspart 100 UNIT/ML injection Commonly known as: novoLOG Inject 7 Units into the skin 3 (three) times daily with meals. Give only if eats 50% or more of meal.   Insulin Glargine (2 Unit Dial) 300 UNIT/ML Sopn Commonly known as: Toujeo Max SoloStar Inject 60 Units into the skin at bedtime.   isosorbide mononitrate 30 MG 24 hr tablet Commonly known as: IMDUR TAKE 1 TABLET(30 MG) BY MOUTH TWICE DAILY   metoprolol tartrate 50 MG tablet Commonly known as: LOPRESSOR TAKE 1 TABLET(50 MG) BY MOUTH TWICE DAILY   nitroGLYCERIN 0.4 MG/SPRAY spray Commonly known as: NITROLINGUAL Place 1 spray under the tongue every 5 (five) minutes x 3 doses as needed for chest pain.   ondansetron 4 MG tablet Commonly known as: ZOFRAN Take 1 tablet (4 mg total) by mouth every 8 (eight) hours as needed for nausea or vomiting.   OXYGEN Inhale 2 L/min into the lungs continuous.   polyethylene glycol powder 17 GM/SCOOP powder Commonly known as: GLYCOLAX/MIRALAX Take 17 g by mouth daily as needed (constipation).   psyllium 58.6 % powder Commonly known as: METAMUCIL Take 2 tsp by mouth: Mix with 8 ounces liquid and drink once a day   tamsulosin 0.4 MG Caps capsule Commonly known as: FLOMAX Take 1 capsule (0.4 mg total) by mouth every evening.   torsemide 20 MG tablet Commonly known as: DEMADEX Take 1 tablet (20 mg total) by mouth daily. Beginning 08/05/2018 Start taking on: October 26, 2018   UNABLE TO FIND CPAP from home while sleeping, at previous home settings      Allergies  Allergen Reactions  . Zocor [Simvastatin] Other (See Comments)    fatigue  . Lipitor [Atorvastatin] Other (See Comments)    Muscle cramping   Follow-up Information    Sasser, Silvestre Moment, MD Follow up in 8 day(s).    Specialty: Family Medicine Why: Discharge follow up appointment: Wednesday 11/02/2018 @ 10:15 a.m. Contact information: [place] [address] [phone]        Satira Sark, MD .   Specialty: Cardiology Contact information: [place] [address] [phone]           The results of significant diagnostics from this hospitalization (including imaging, microbiology, ancillary and laboratory) are listed below for reference.    Significant Diagnostic Studies: Dg Chest Port 1 View  Result Date: 10/22/2018 CLINICAL DATA:  Shortness of breath EXAM: PORTABLE CHEST 1 VIEW COMPARISON:  July 21, 2018 FINDINGS: There is airspace consolidation in a portion of the left lower lobe with small left pleural effusion. The right lung is clear. There is cardiomegaly with mild pulmonary venous hypertension. No adenopathy. There is aortic atherosclerosis. Patient is status post median sternotomy. No bone lesions. IMPRESSION: Airspace consolidation felt to represent a degree of pneumonia left lower lobe. Small left pleural effusion. Right lung clear. Stable cardiomegaly. Aortic Atherosclerosis (ICD10-I70.0). Electronically Signed   By: Lowella Grip III M.D.   On: 10/22/2018 12:13    Microbiology:  Recent Results (from the past 240 hour(s))  SARS Coronavirus 2 (CEPHEID - Performed in [facility] hospital lab), Hosp Order     Status: None   Collection Time: 10/22/18  1:44 PM   Specimen: Nasopharyngeal Swab  Result Value Ref Range Status   SARS Coronavirus 2 NEGATIVE NEGATIVE Final    Comment: (NOTE) If result is NEGATIVE SARS-CoV-2 target nucleic acids are NOT DETECTED. The SARS-CoV-2 RNA is generally detectable in upper and lower  respiratory specimens during the acute phase of infection. The lowest  concentration of SARS-CoV-2 viral copies this assay can detect is 250  copies / mL. A negative result does not preclude SARS-CoV-2 infection  and should  not be used as the sole basis for treatment or other  patient management decisions.  A negative result may occur with  improper specimen collection / handling, submission of specimen other  than nasopharyngeal swab, presence of viral mutation(s) within the  areas targeted by this assay, and inadequate number of viral copies  (<250 copies / mL). A negative result must be combined with clinical  observations, patient history, and epidemiological information. If result is POSITIVE SARS-CoV-2 target nucleic acids are DETECTED. The SARS-CoV-2 RNA is generally detectable in upper and lower  respiratory specimens dur ing the acute phase of infection.  Positive  results are indicative of active infection with SARS-CoV-2.  Clinical  correlation with patient history and other diagnostic information is  necessary to determine patient infection status.  Positive results do  not rule out bacterial infection or co-infection with other viruses. If result is PRESUMPTIVE POSTIVE SARS-CoV-2 nucleic acids MAY BE PRESENT.   A presumptive positive result was obtained on the submitted specimen  and confirmed on repeat testing.  While 2019 novel coronavirus  (SARS-CoV-2) nucleic acids may be present in the submitted sample  additional confirmatory testing may be necessary for epidemiological  and / or clinical management purposes  to differentiate between  SARS-CoV-2 and other Sarbecovirus currently known to infect humans.  If clinically indicated additional testing with an alternate test  methodology [phone]) is advised. The SARS-CoV-2 RNA is generally  detectable in upper and lower respiratory sp ecimens during the acute  phase of infection. The expected result is Negative. Fact Sheet for Patients:  StrictlyIdeas.no Fact Sheet for Healthcare Providers: BankingDealers.co.za This test is not yet approved or cleared by the [place] FDA and has been  authorized for detection and/or diagnosis of SARS-CoV-2 by FDA under an Emergency Use Authorization (EUA).  This EUA will remain in effect (meaning this test can be used) for the duration of the COVID-19 declaration under Section 564(b)(1) of the Act, 21 U.S.C. section 360bbb-3(b)(1), unless the authorization is terminated or revoked sooner. Performed at [facility], [address] [place], [place] [address]   MRSA PCR Screening     Status: None   Collection Time: 10/22/18  2:23 PM   Specimen: Nasal Mucosa; Nasopharyngeal  Result Value Ref Range Status   MRSA by PCR NEGATIVE NEGATIVE Final    Comment:        The GeneXpert MRSA Assay (FDA approved for NASAL specimens only), is one component of a comprehensive MRSA colonization surveillance program. It is not intended to diagnose MRSA infection nor to guide or monitor treatment for MRSA infections. Performed at [facility], [address] [place], [place] [address]   Culture, blood (Routine X 2) w Reflex to ID Panel     Status: None (Preliminary result)   Collection Time: 10/22/18  2:31 PM   Specimen: BLOOD RIGHT ARM  Result Value Ref Range Status   Specimen Description   Final    BLOOD RIGHT ARM BOTTLES DRAWN AEROBIC AND ANAEROBIC   Special Requests Blood Culture adequate volume  Final   Culture   Final    NO GROWTH 3 DAYS Performed at [facility], [address], [place], [place] [address]    Report Status PENDING  Incomplete  Culture, blood (Routine X 2) w Reflex to ID Panel     Status: None (Preliminary result)   Collection Time: 10/22/18  2:42 PM   Specimen: BLOOD LEFT HAND  Result Value Ref Range Status   Specimen Description   Final    BLOOD LEFT HAND BOTTLES DRAWN AEROBIC AND ANAEROBIC   Special Requests   Final    Blood Culture results may not be optimal due to an inadequate volume of blood received in culture bottles   Culture   Final    NO GROWTH 3 DAYS Performed at [facility], [address]  [place], [place] [address]    Report Status PENDING  Incomplete     Labs: Basic Metabolic Panel: Recent Labs  Lab 10/22/18 1125 10/23/18 0455 10/24/18 0510  NA 132* 134* 135  K 3.7 3.5 3.4*  CL 93* 95* 94*  CO2 28 28 28   GLUCOSE 405* 245* 175*  BUN 111* 108* 102*  CREATININE 2.64* 2.39* 2.36*  CALCIUM 9.3 8.9 8.8*   Liver Function Tests: Recent Labs  Lab 10/22/18 1125  AST 14*  ALT 12  ALKPHOS 63  BILITOT 0.4  PROT 7.3  ALBUMIN 4.0   CBC: Recent Labs  Lab 10/22/18 1125 10/23/18 0455  WBC 7.4 7.1  HGB 12.2* 11.0*  HCT 38.1* 34.7*  MCV 90.1 90.4  PLT 183 170    BNP (last 3 results) Recent Labs    07/21/18 0835 10/22/18 1125  BNP 126.0* 83.0   CBG: Recent Labs  Lab 10/24/18 1558 10/24/18 2232 10/25/18 0725 10/25/18 1120 10/25/18 1554  GLUCAP 236* 262* 164* 279* 285*    Signed:  Barton Dubois MD.  [facility] 10/25/2018, 4:11 PM

## 2018-10-25 NOTE — Evaluation (Signed)
Physical Therapy Evaluation Patient Details Name: [name] MRN: [id number] DOB: 17-May-1933 Today's Date: 10/25/2018   History of Present Illness  [name] is a 85 y.o. male with medical history significant for systolic and diastolic CHF, COPD, ST elevation MI, morbid obesity-BMI 25, hypertension, diabetes mellitus, CKD 3.  Patient presented to the ED with complaints of difficulty breathing of 2 days duration, and worsening of his lower extremity swelling over the past 1 week.  He also tells me his abdomen feels more full than usual.  He also reports some mild nonproductive cough. He is unaware if he has actually been taking his torsemide, or if he is on Lasix.    Clinical Impression  Patient agreeable for therapy and demonstrates labored movement requiring increased time and use of bed rail to sit up at bedside, c/o lightheadedness upon sitting up that took 3-4 minutes to resolve, able to ambulate out side of room without loss of balance, limited mostly due to fatigue, on room air with SpO2 at 95% and tolerated sitting up in chair after therapy - RN aware.  Patient will benefit from continued physical therapy in hospital and recommended venue below to increase strength, balance, endurance for safe ADLs and gait.    Follow Up Recommendations Home health PT;Supervision - Intermittent;Supervision for mobility/OOB    Equipment Recommendations  None recommended by PT    Recommendations for Other Services       Precautions / Restrictions Precautions Precautions: Fall Restrictions Weight Bearing Restrictions: No      Mobility  Bed Mobility Overal bed mobility: Needs Assistance Bed Mobility: Supine to Sit     Supine to sit: Supervision     General bed mobility comments: increased time  Transfers Overall transfer level: Needs assistance Equipment used: Rolling walker (2 wheeled) Transfers: Sit to/from [facility] Sit to Stand: Min guard Stand pivot  transfers: Min guard       General transfer comment: slightly labored movement, increased time  Ambulation/Gait Ambulation/Gait assistance: Min guard Gait Distance (Feet): 30 Feet Assistive device: Rolling walker (2 wheeled) Gait Pattern/deviations: Decreased step length - right;Decreased step length - left;Decreased stride length;Decreased stance time - left Gait velocity: decreased   General Gait Details: slow slightly labored cadence without loss of balance, good return for keeping body weight off left toe, limited secondary to fatigue  Stairs            Wheelchair Mobility    Modified Rankin (Stroke Patients Only)       Balance Overall balance assessment: Needs assistance Sitting-balance support: Feet supported;No upper extremity supported Sitting balance-Leahy Scale: Good     Standing balance support: During functional activity;Bilateral upper extremity supported Standing balance-Leahy Scale: Fair Standing balance comment: using RW                             Pertinent Vitals/Pain Pain Assessment: Faces Faces Pain Scale: Hurts a little bit Pain Location: left great toe due to toe nail removed during fall 2 weeks ago Pain Descriptors / Indicators: Guarding;Sore Pain Intervention(s): Limited activity within patient's tolerance;Monitored during session    [place] expects to be discharged to:: Private residence Living Arrangements: Spouse/significant other Available Help at Discharge: Family;Available 24 hours/day Type of Home: House Home Access: Level entry     Home Layout: One level Home Equipment: Walker - 2 wheels;Cane - single point      Prior Function Level of Independence: Independent  with assistive device(s)         Comments: Biochemist, clinical Dominance   Dominant Hand: Left    Extremity/Trunk Assessment   Upper Extremity Assessment Upper Extremity Assessment: Overall WFL for tasks  assessed    Lower Extremity Assessment Lower Extremity Assessment: Generalized weakness    Cervical / Trunk Assessment Cervical / Trunk Assessment: Normal  Communication   Communication: No difficulties  Cognition Arousal/Alertness: Awake/alert Behavior During Therapy: WFL for tasks assessed/performed Overall Cognitive Status: Within Functional Limits for tasks assessed                                        General Comments      Exercises     Assessment/Plan    PT Assessment Patient needs continued PT services  PT Problem List Decreased strength;Decreased activity tolerance;Decreased balance;Pain       PT Treatment Interventions Balance training;Gait training;Stair training;Functional mobility training;Therapeutic activities;Therapeutic exercise;Patient/family education    PT Goals (Current goals can be found in the Care Plan section)  Acute Rehab PT Goals Patient Stated Goal: return home with family to assist PT Goal Formulation: With patient Time For Goal Achievement: 10/28/18 Potential to Achieve Goals: Good    Frequency Min 3X/week   Barriers to discharge        Co-evaluation               AM-PAC PT "6 Clicks" Mobility  Outcome Measure Help needed turning from your back to your side while in a flat bed without using bedrails?: None Help needed moving from lying on your back to sitting on the side of a flat bed without using bedrails?: None Help needed moving to and from a bed to a chair (including a wheelchair)?: A Little Help needed standing up from a chair using your arms (e.g., wheelchair or bedside chair)?: A Little Help needed to walk in hospital room?: A Little Help needed climbing 3-5 steps with a railing? : A Lot 6 Click Score: 19    End of Session Equipment Utilized During Treatment: Gait belt Activity Tolerance: Patient tolerated treatment well;Patient limited by fatigue Patient left: in chair;with call bell/phone within  reach Nurse Communication: Mobility status PT Visit Diagnosis: Unsteadiness on feet (R26.81);Other abnormalities of gait and mobility (R26.89)    Time: 9449-6759 PT Time Calculation (min) (ACUTE ONLY): 24 min   Charges:   PT Evaluation $PT Eval Moderate Complexity: 1 Mod PT Treatments $Therapeutic Activity: 23-37 mins        8:53 AM, 10/25/18 Lonell Grandchild, MPT Physical Therapist with [facility] 336 [phone] office [phone] mobile phone

## 2018-10-25 NOTE — Plan of Care (Signed)
  Problem: Acute Rehab PT Goals(only PT should resolve) Goal: Pt Will Go Supine/Side To Sit Outcome: Progressing Flowsheets (Taken 10/25/2018 0854) Pt will go Supine/Side to Sit: with modified independence Goal: Patient Will Transfer Sit To/From Stand Outcome: Progressing Flowsheets (Taken 10/25/2018 0854) Patient will transfer sit to/from stand: with supervision Goal: Pt Will Transfer Bed To Chair/Chair To Bed Outcome: Progressing Flowsheets (Taken 10/25/2018 0854) Pt will Transfer Bed to Chair/Chair to Bed: with supervision Goal: Pt Will Ambulate Outcome: Progressing Flowsheets (Taken 10/25/2018 0854) Pt will Ambulate:  50 feet  with supervision  with rolling walker   8:55 AM, 10/25/18 Lonell Grandchild, MPT Physical Therapist with [facility] [phone] office [phone] mobile phone

## 2018-10-25 NOTE — Progress Notes (Signed)
Nsg Discharge Note  Admit Date:  10/22/2018 Discharge date: 10/25/2018   Lucienne Capers Parlee to be D/C'd Home per MD order.  AVS completed.  Copy for chart, and copy for patient signed, and dated. Patient/caregiver able to verbalize understanding.  Discharge Medication: Allergies as of 10/25/2018      Reactions   Zocor [simvastatin] Other (See Comments)   fatigue   Lipitor [atorvastatin] Other (See Comments)   Muscle cramping      Medication List    TAKE these medications   acetaminophen 325 MG tablet Commonly known as: TYLENOL Take 650 mg by mouth every 4 (four) hours as needed.   allopurinol 100 MG tablet Commonly known as: ZYLOPRIM Take 2 tablets (200 mg total) by mouth daily.   alprazolam 2 MG tablet Commonly known as: XANAX Take 1 tablet by mouth 2 (two) times a day.   amoxicillin-clavulanate 500-125 MG tablet Commonly known as: AUGMENTIN Take 1 tablet (500 mg total) by mouth every 12 (twelve) hours for 5 days.   aspirin EC 81 MG tablet Take 81 mg by mouth every morning.   clopidogrel 75 MG tablet Commonly known as: PLAVIX take 1 tablet by mouth once daily WITH BREAKFAST   hydrALAZINE 10 MG tablet Commonly known as: APRESOLINE Take 1 tablet (10 mg total) by mouth every 8 (eight) hours.   insulin aspart 100 UNIT/ML injection Commonly known as: novoLOG Inject 7 Units into the skin 3 (three) times daily with meals. Give only if eats 50% or more of meal.   Insulin Glargine (2 Unit Dial) 300 UNIT/ML Sopn Commonly known as: Toujeo Max SoloStar Inject 60 Units into the skin at bedtime.   isosorbide mononitrate 30 MG 24 hr tablet Commonly known as: IMDUR TAKE 1 TABLET(30 MG) BY MOUTH TWICE DAILY   metoprolol tartrate 50 MG tablet Commonly known as: LOPRESSOR TAKE 1 TABLET(50 MG) BY MOUTH TWICE DAILY   nitroGLYCERIN 0.4 MG/SPRAY spray Commonly known as: NITROLINGUAL Place 1 spray under the tongue every 5 (five) minutes x 3 doses as needed for chest pain.    ondansetron 4 MG tablet Commonly known as: ZOFRAN Take 1 tablet (4 mg total) by mouth every 8 (eight) hours as needed for nausea or vomiting.   OXYGEN Inhale 2 L/min into the lungs continuous.   polyethylene glycol powder 17 GM/SCOOP powder Commonly known as: GLYCOLAX/MIRALAX Take 17 g by mouth daily as needed (constipation).   psyllium 58.6 % powder Commonly known as: METAMUCIL Take 2 tsp by mouth: Mix with 8 ounces liquid and drink once a day   tamsulosin 0.4 MG Caps capsule Commonly known as: FLOMAX Take 1 capsule (0.4 mg total) by mouth every evening.   torsemide 20 MG tablet Commonly known as: DEMADEX Take 1 tablet (20 mg total) by mouth daily. Beginning 08/05/2018 Start taking on: October 26, 2018   UNABLE TO FIND CPAP from home while sleeping, at previous home settings       Discharge Assessment: Vitals:   10/25/18 0702 10/25/18 1403  BP: (!) 148/69 137/75  Pulse: 68 72  Resp: 17 18  Temp: 98.2 F (36.8 C) 98.1 F (36.7 C)  SpO2: 94% 95%   Skin clean, dry and intact without evidence of skin break down, no evidence of skin tears noted. IV catheter discontinued intact. Site without signs and symptoms of complications - no redness or edema noted at insertion site, patient denies c/o pain - only slight tenderness at site.  Dressing with slight pressure applied.  D/c Instructions-Education:  Discharge instructions given to patient/family with verbalized understanding. D/c education completed with patient/family including follow up instructions, medication list, d/c activities limitations if indicated, with other d/c instructions as indicated by MD - patient able to verbalize understanding, all questions fully answered. Patient instructed to return to ED, call 911, or call MD for any changes in condition.  Patient escorted via [place], and D/C home via private auto.  Loa Socks, RN 10/25/2018 4:31 PM

## 2018-10-25 NOTE — Progress Notes (Signed)
Inpatient Diabetes Program Recommendations  AACE/ADA: New Consensus Statement on Inpatient Glycemic Control  Target Ranges:  Prepandial:   less than 140 mg/dL      Peak postprandial:   less than 180 mg/dL (1-2 hours)      Critically ill patients:  140 - 180 mg/dL  Results for [name], [name] (MRN [id number]) as of 10/25/2018 11:40  Ref. Range 10/24/2018 07:24 10/24/2018 11:32 10/24/2018 15:58 10/24/2018 22:32 10/25/2018 07:25 10/25/2018 11:20  Glucose-Capillary Latest Ref Range: 70 - 99 mg/dL 155 (H) 261 (H) 236 (H) 262 (H) 164 (H) 279 (H)   Results for [name], [name] (MRN [id number]) as of 10/24/2018 11:24  Ref. Range 10/23/2018 07:36 10/23/2018 11:12 10/23/2018 16:28 10/23/2018 21:22 10/24/2018 07:24  Glucose-Capillary Latest Ref Range: 70 - 99 mg/dL 225 (H) 303 (H) 284 (H) 237 (H) 155 (H)    Review of Glycemic Control  Diabetes history: Dm2 Outpatient Diabetes medications: Toujeo 60 units QAM, Novolog 7 units TID with meals Current orders for Inpatient glycemic control: Lantus 60 units QHS, Novolog 0-15 units TID with meals  Inpatient Diabetes Program Recommendations:   Correction (SSI): Please consider ordering Novolog 0-5 units QHS for bedtime correction.  Insulin - Meal Coverage: Post prandial glucose is consistently elevated.  Please consider ordering Novolog 5 units TID with meals if patient eats at least 50% of meals.  Thanks, Barnie Alderman, RN, MSN, CDE Diabetes Coordinator Inpatient Diabetes Program [phone] (Team Pager from 8am to 5pm)

## 2018-10-27 DIAGNOSIS — I5042 Chronic combined systolic (congestive) and diastolic (congestive) heart failure: Secondary | ICD-10-CM | POA: Diagnosis not present

## 2018-10-27 DIAGNOSIS — I2510 Atherosclerotic heart disease of native coronary artery without angina pectoris: Secondary | ICD-10-CM | POA: Diagnosis not present

## 2018-10-27 DIAGNOSIS — G4730 Sleep apnea, unspecified: Secondary | ICD-10-CM | POA: Diagnosis not present

## 2018-10-27 DIAGNOSIS — J9611 Chronic respiratory failure with hypoxia: Secondary | ICD-10-CM | POA: Diagnosis not present

## 2018-10-27 DIAGNOSIS — J302 Other seasonal allergic rhinitis: Secondary | ICD-10-CM | POA: Diagnosis not present

## 2018-10-27 DIAGNOSIS — K219 Gastro-esophageal reflux disease without esophagitis: Secondary | ICD-10-CM | POA: Diagnosis not present

## 2018-10-27 DIAGNOSIS — Z9181 History of falling: Secondary | ICD-10-CM | POA: Diagnosis not present

## 2018-10-27 DIAGNOSIS — E1122 Type 2 diabetes mellitus with diabetic chronic kidney disease: Secondary | ICD-10-CM | POA: Diagnosis not present

## 2018-10-27 DIAGNOSIS — J440 Chronic obstructive pulmonary disease with acute lower respiratory infection: Secondary | ICD-10-CM | POA: Diagnosis not present

## 2018-10-27 DIAGNOSIS — L89312 Pressure ulcer of right buttock, stage 2: Secondary | ICD-10-CM | POA: Diagnosis not present

## 2018-10-27 DIAGNOSIS — I130 Hypertensive heart and chronic kidney disease with heart failure and stage 1 through stage 4 chronic kidney disease, or unspecified chronic kidney disease: Secondary | ICD-10-CM | POA: Diagnosis not present

## 2018-10-27 DIAGNOSIS — M1990 Unspecified osteoarthritis, unspecified site: Secondary | ICD-10-CM | POA: Diagnosis not present

## 2018-10-27 DIAGNOSIS — F329 Major depressive disorder, single episode, unspecified: Secondary | ICD-10-CM | POA: Diagnosis not present

## 2018-10-27 DIAGNOSIS — J189 Pneumonia, unspecified organism: Secondary | ICD-10-CM | POA: Diagnosis not present

## 2018-10-27 DIAGNOSIS — E785 Hyperlipidemia, unspecified: Secondary | ICD-10-CM | POA: Diagnosis not present

## 2018-10-27 DIAGNOSIS — Z9981 Dependence on supplemental oxygen: Secondary | ICD-10-CM | POA: Diagnosis not present

## 2018-10-27 DIAGNOSIS — I252 Old myocardial infarction: Secondary | ICD-10-CM | POA: Diagnosis not present

## 2018-10-27 DIAGNOSIS — N183 Chronic kidney disease, stage 3 (moderate): Secondary | ICD-10-CM | POA: Diagnosis not present

## 2018-10-27 DIAGNOSIS — F419 Anxiety disorder, unspecified: Secondary | ICD-10-CM | POA: Diagnosis not present

## 2018-10-27 DIAGNOSIS — Z6841 Body Mass Index (BMI) 40.0 and over, adult: Secondary | ICD-10-CM | POA: Diagnosis not present

## 2018-10-27 DIAGNOSIS — Z794 Long term (current) use of insulin: Secondary | ICD-10-CM | POA: Diagnosis not present

## 2018-10-27 DIAGNOSIS — S91201D Unspecified open wound of right great toe with damage to nail, subsequent encounter: Secondary | ICD-10-CM | POA: Diagnosis not present

## 2018-10-27 DIAGNOSIS — Z7902 Long term (current) use of antithrombotics/antiplatelets: Secondary | ICD-10-CM | POA: Diagnosis not present

## 2018-10-27 LAB — CULTURE, BLOOD (ROUTINE X 2)
Culture: NO GROWTH
Culture: NO GROWTH
Special Requests: ADEQUATE

## 2018-10-31 ENCOUNTER — Other Ambulatory Visit: Payer: Self-pay | Admitting: Cardiology

## 2018-10-31 ENCOUNTER — Other Ambulatory Visit: Payer: Self-pay | Admitting: Adult Health

## 2018-11-02 DIAGNOSIS — I10 Essential (primary) hypertension: Secondary | ICD-10-CM | POA: Diagnosis not present

## 2018-11-02 DIAGNOSIS — Z6841 Body Mass Index (BMI) 40.0 and over, adult: Secondary | ICD-10-CM | POA: Diagnosis not present

## 2018-11-02 DIAGNOSIS — E1165 Type 2 diabetes mellitus with hyperglycemia: Secondary | ICD-10-CM | POA: Diagnosis not present

## 2018-11-02 DIAGNOSIS — J189 Pneumonia, unspecified organism: Secondary | ICD-10-CM | POA: Diagnosis not present

## 2018-11-02 DIAGNOSIS — G4733 Obstructive sleep apnea (adult) (pediatric): Secondary | ICD-10-CM | POA: Diagnosis not present

## 2018-11-02 DIAGNOSIS — F419 Anxiety disorder, unspecified: Secondary | ICD-10-CM | POA: Diagnosis not present

## 2018-11-02 DIAGNOSIS — E782 Mixed hyperlipidemia: Secondary | ICD-10-CM | POA: Diagnosis not present

## 2018-11-02 DIAGNOSIS — I5032 Chronic diastolic (congestive) heart failure: Secondary | ICD-10-CM | POA: Diagnosis not present

## 2018-11-02 DIAGNOSIS — M1990 Unspecified osteoarthritis, unspecified site: Secondary | ICD-10-CM | POA: Diagnosis not present

## 2018-11-02 DIAGNOSIS — R609 Edema, unspecified: Secondary | ICD-10-CM | POA: Diagnosis not present

## 2018-11-02 DIAGNOSIS — G4700 Insomnia, unspecified: Secondary | ICD-10-CM | POA: Diagnosis not present

## 2018-11-02 DIAGNOSIS — N183 Chronic kidney disease, stage 3 (moderate): Secondary | ICD-10-CM | POA: Diagnosis not present

## 2018-11-11 DIAGNOSIS — I5032 Chronic diastolic (congestive) heart failure: Secondary | ICD-10-CM | POA: Diagnosis not present

## 2018-11-11 DIAGNOSIS — E7800 Pure hypercholesterolemia, unspecified: Secondary | ICD-10-CM | POA: Diagnosis not present

## 2018-11-11 DIAGNOSIS — I10 Essential (primary) hypertension: Secondary | ICD-10-CM | POA: Diagnosis not present

## 2018-11-15 ENCOUNTER — Other Ambulatory Visit: Payer: Self-pay | Admitting: Adult Health

## 2018-11-17 ENCOUNTER — Encounter: Payer: Self-pay | Admitting: *Deleted

## 2018-11-17 ENCOUNTER — Encounter: Payer: Self-pay | Admitting: Cardiology

## 2018-11-17 ENCOUNTER — Other Ambulatory Visit: Payer: Self-pay

## 2018-11-17 ENCOUNTER — Ambulatory Visit (INDEPENDENT_AMBULATORY_CARE_PROVIDER_SITE_OTHER): Payer: PPO | Admitting: Cardiology

## 2018-11-17 VITALS — BP 120/60 | HR 69 | Temp 98.8°F | Ht 65.0 in | Wt 265.0 lb

## 2018-11-17 DIAGNOSIS — N183 Chronic kidney disease, stage 3 unspecified: Secondary | ICD-10-CM

## 2018-11-17 DIAGNOSIS — I5042 Chronic combined systolic (congestive) and diastolic (congestive) heart failure: Secondary | ICD-10-CM | POA: Diagnosis not present

## 2018-11-17 DIAGNOSIS — I25119 Atherosclerotic heart disease of native coronary artery with unspecified angina pectoris: Secondary | ICD-10-CM | POA: Diagnosis not present

## 2018-11-17 DIAGNOSIS — I10 Essential (primary) hypertension: Secondary | ICD-10-CM | POA: Diagnosis not present

## 2018-11-17 NOTE — Progress Notes (Signed)
Cardiology Office Note  Date: 11/17/2018   ID: [name], DOB 1933/07/20, MRN [id number]  PCP:  [name], MD  Cardiologist:  [name], MD Electrophysiologist:  None   Chief Complaint  Patient presents with  . Cardiac follow-up    History of Present Illness: [name] is an 85 y.o. male last assessed via telehealth encounter in May.  I reviewed interval records, he was hospitalized in July with worsening shortness of breath and leg swelling.  He was ultimately diagnosed with left lower lobe pneumonia and treated with antibiotics.  He was maintained on diuretic regimen with Demadex 20 mg daily.  He presents for a follow-up visit.  He states that he feels better.  He tells me that his weight has been coming down based on home scales, he has home health nursing as well.  He did have blood work through [facility] which we are requesting.  He had been on higher dose Demadex previously.  Past Medical History:  Diagnosis Date  . Allergic rhinitis   . Anxiety   . Arthritis   . COPD (chronic obstructive pulmonary disease) ([place])   . Coronary atherosclerosis of native coronary artery    a. CABG x 4 in 1994. Previous stent placement to SVG to distal RCA;  b. DES to native Cx in 2006;  c. DES SVG to RCA 05/08/11; d. Inf MI/Cath/PCI: LM 25, LAD 100, LCX patent stent, 50 into OM, RI 60-70, RCA 100, LIMA->LAD nl, VG->Diag 100 old, VG->RCA/PL 99 (DES x 1), EF 50-55%.  . Depression    With anxious features/hx panic attacks  . Essential hypertension, benign   . GERD (gastroesophageal reflux disease)   . Hyperlipidemia    Not on statin 2/2 hx of intolerance.   . Morbid obesity ([name])   . Nephrolithiasis   . Pneumonia   . Sleep apnea   . ST elevation myocardial infarction (STEMI) of inferior wall ([name])    4/14 - DES SVG to PDA  . Type 2 diabetes mellitus ([name])     Past Surgical History:  Procedure Laterality Date  . CORONARY ARTERY BYPASS GRAFT  1994  . EYE SURGERY     . LEFT HEART CATHETERIZATION WITH CORONARY ANGIOGRAM N/A 05/08/2011   Procedure: LEFT HEART CATHETERIZATION WITH CORONARY ANGIOGRAM;  Surgeon: [name], MD;  Location[facility] CATH LAB;  Service: Cardiovascular;  Laterality: N/A;  . LEFT HEART CATHETERIZATION WITH CORONARY ANGIOGRAM N/A 08/08/2012   Procedure: LEFT HEART CATHETERIZATION WITH CORONARY ANGIOGRAM;  Surgeon: [name], MD;  Location: [facility] CATH LAB;  Service: Cardiovascular;  Laterality: N/A;  . PERCUTANEOUS CORONARY STENT INTERVENTION (PCI-S) Right 05/08/2011   Procedure: PERCUTANEOUS CORONARY STENT INTERVENTION (PCI-S);  Surgeon: [name], MD;  Location: [facility] CATH LAB;  Service: Cardiovascular;  Laterality: Right;  . PERCUTANEOUS CORONARY STENT INTERVENTION (PCI-S) N/A 08/08/2012   Procedure: PERCUTANEOUS CORONARY STENT INTERVENTION (PCI-S);  Surgeon: [name], MD;  Location: [facility] CATH LAB;  Service: Cardiovascular;  Laterality: N/A;    Current Outpatient Medications  Medication Sig Dispense Refill  . acetaminophen (TYLENOL) 325 MG tablet Take 650 mg by mouth every 4 (four) hours as needed.    Marland Kitchen allopurinol (ZYLOPRIM) 100 MG tablet Take 2 tablets (200 mg total) by mouth daily. 60 tablet 0  . alprazolam (XANAX) 2 MG tablet Take 1 tablet by mouth 2 (two) times a day.    Marland Kitchen aspirin EC 81 MG tablet Take 81 mg by mouth every morning.    Marland Kitchen  clopidogrel (PLAVIX) 75 MG tablet take 1 tablet by mouth once daily WITH BREAKFAST 30 tablet 0  . hydrALAZINE (APRESOLINE) 10 MG tablet TAKE 1 TABLET(10 MG) BY MOUTH EVERY 8 HOURS 90 tablet 1  . insulin aspart (NOVOLOG) 100 UNIT/ML injection Inject 7 Units into the skin 3 (three) times daily with meals. Give only if eats 50% or more of meal. 10 mL 0  . Insulin Glargine, 2 Unit Dial, (TOUJEO MAX SOLOSTAR) 300 UNIT/ML SOPN Inject 60 Units into the skin at bedtime. 15 mL 0  . isosorbide mononitrate (IMDUR) 30 MG 24 hr tablet TAKE 1 TABLET(30 MG) BY MOUTH TWICE DAILY 60 tablet  11  . metoprolol tartrate (LOPRESSOR) 50 MG tablet TAKE 1 TABLET(50 MG) BY MOUTH TWICE DAILY 60 tablet 11  . nitroGLYCERIN (NITROLINGUAL) 0.4 MG/SPRAY spray Place 1 spray under the tongue every 5 (five) minutes x 3 doses as needed for chest pain. 12 g 0  . ondansetron (ZOFRAN) 4 MG tablet Take 1 tablet (4 mg total) by mouth every 8 (eight) hours as needed for nausea or vomiting. 10 tablet 0  . OXYGEN Inhale 2 L/min into the lungs continuous.    . polyethylene glycol powder (GLYCOLAX/MIRALAX) powder Take 17 g by mouth daily as needed (constipation).     . psyllium (METAMUCIL) 58.6 % powder Take 2 tsp by mouth: Mix with 8 ounces liquid and drink once a day    . tamsulosin (FLOMAX) 0.4 MG CAPS capsule Take 1 capsule (0.4 mg total) by mouth every evening. 30 capsule 0  . torsemide (DEMADEX) 20 MG tablet Take 1 tablet (20 mg total) by mouth daily. Beginning 08/05/2018 30 tablet 1  . UNABLE TO FIND CPAP from home while sleeping, at previous home settings     No current facility-administered medications for this visit.    Allergies:  Zocor [simvastatin] and Lipitor [atorvastatin]   Social History: The patient  reports that he has never smoked. He has never used smokeless tobacco. He reports that he does not drink alcohol or use drugs.   ROS:  Please see the history of present illness. Otherwise, complete review of systems is positive for hearing loss, uses a walker to ambulate.  All other systems are reviewed and negative.   Physical Exam: VS:  BP 120/60   Pulse 69   Temp 98.8 F (37.1 C)   Ht 5\' 5"  (1.651 m)   Wt 265 lb (120.2 kg)   SpO2 96%   BMI 44.10 kg/m , BMI Body mass index is 44.1 kg/m.  Wt Readings from Last 3 Encounters:  11/17/18 265 lb (120.2 kg)  10/25/18 240 lb 15.4 oz (109.3 kg)  09/07/18 264 lb (119.7 kg)    General: Obese elderly male, appears comfortable at rest.  Using a walker. HEENT: Conjunctiva and lids normal, oropharynx clear with moist mucosa. Neck: Supple, no  elevated JVP or carotid bruits, no thyromegaly. Lungs: Clear to auscultation, nonlabored breathing at rest. Cardiac: Regular rate and rhythm, no S3 or significant systolic murmur, no pericardial rub. Abdomen: Protuberant, bowel sounds present, no guarding or rebound. Extremities: Chronic appearing bilateral leg edema, distal pulses 2+. Skin: Warm and dry. Musculoskeletal: No kyphosis. Neuropsychiatric: Alert and oriented x3, affect grossly appropriate.  ECG:  An ECG dated 10/22/2018 was personally reviewed today and demonstrated:  Sinus rhythm with possible old inferior infarct pattern, PAC, poor R wave progression.  Recent Labwork: 10/22/2018: ALT 12; AST 14; B Natriuretic Peptide 83.0 10/23/2018: Hemoglobin 11.0; Platelets 170 10/24/2018: BUN 102;  Creatinine, Ser 2.36; Potassium 3.4; Sodium 135     Component Value Date/Time   CHOL 185 05/25/2014 0549   TRIG 92 05/25/2014 0549   HDL 44 05/25/2014 0549   CHOLHDL 4.2 05/25/2014 0549   VLDL 18 05/25/2014 0549   LDLCALC 123 (H) 05/25/2014 0549    Other Studies Reviewed Today:  Echocardiogram 07/21/2018:  1. Images are limited.  2. The left ventricle had a visually estimated ejection fraction of approximately 40-45%. Unable to assess focal wall motion. The cavity size was normal. There is moderately increased left ventricular wall thickness. Left ventricular diastolic Doppler  parameters are consistent with impaired relaxation.  3. The cavity was mildly enlarged.  4. The aortic valve is tricuspid. Moderate calcification of the aortic valve. Mild to moderate aortic annular calcification noted. Possible mild aortic stenosis.  5. The mitral valve is grossly normal. There is mild to moderate mitral annular calcification present.  6. The tricuspid valve was grossly normal.  7. The aortic root is normal in size and structure.  Assessment and Plan:  1.  Chronic diastolic heart failure.  Recent records reviewed.  His Demadex is now 20 mg daily.   He states that his weight is down by home checks per home health nursing, but I am concerned that he is going to regain fluid.  We will request recent lab work from [facility].  He has chronic renal insufficiency and his recent numbers have been fairly stable.  2.  CKD stage III, creatinine 2.36.  3.  CAD status post CABG with graft disease and subsequent PCI.  We are managing him medically.  He remains on dual antiplatelet therapy, beta-blocker, and nitrates.  4.  Known statin intolerance.  Medication Adjustments/Labs and Tests Ordered: Current medicines are reviewed at length with the patient today.  Concerns regarding medicines are outlined above.   Tests Ordered: No orders of the defined types were placed in this encounter.   Medication Changes: No orders of the defined types were placed in this encounter.   Disposition:  Follow up 3 months in the [facility] office.  Signed, Satira Sark, MD, [facility] 11/17/2018 2:23 PM    [facility] [place], [place], [place] [address] Phone: [phone]; Fax: [phone]

## 2018-11-17 NOTE — Patient Instructions (Addendum)
Medication Instructions:   Your physician recommends that you continue on your current medications as directed. Please refer to the Current Medication list given to you today.  Labwork:  NONE  Testing/Procedures:  NONE  Follow-Up:  Your physician recommends that you schedule a follow-up appointment in: 3 months.  Any Other Special Instructions Will Be Listed Below (If Applicable).  If you need a refill on your cardiac medications before your next appointment, please call your pharmacy. 

## 2018-11-20 DIAGNOSIS — R0902 Hypoxemia: Secondary | ICD-10-CM | POA: Diagnosis not present

## 2018-11-20 DIAGNOSIS — G4733 Obstructive sleep apnea (adult) (pediatric): Secondary | ICD-10-CM | POA: Diagnosis not present

## 2018-11-23 ENCOUNTER — Other Ambulatory Visit: Payer: Self-pay | Admitting: Adult Health

## 2018-11-24 DIAGNOSIS — N183 Chronic kidney disease, stage 3 (moderate): Secondary | ICD-10-CM | POA: Diagnosis not present

## 2018-11-24 DIAGNOSIS — M1990 Unspecified osteoarthritis, unspecified site: Secondary | ICD-10-CM | POA: Diagnosis not present

## 2018-11-24 DIAGNOSIS — E782 Mixed hyperlipidemia: Secondary | ICD-10-CM | POA: Diagnosis not present

## 2018-11-24 DIAGNOSIS — I5032 Chronic diastolic (congestive) heart failure: Secondary | ICD-10-CM | POA: Diagnosis not present

## 2018-11-24 DIAGNOSIS — E1165 Type 2 diabetes mellitus with hyperglycemia: Secondary | ICD-10-CM | POA: Diagnosis not present

## 2018-11-24 DIAGNOSIS — I10 Essential (primary) hypertension: Secondary | ICD-10-CM | POA: Diagnosis not present

## 2018-11-24 DIAGNOSIS — J189 Pneumonia, unspecified organism: Secondary | ICD-10-CM | POA: Diagnosis not present

## 2018-11-24 DIAGNOSIS — R609 Edema, unspecified: Secondary | ICD-10-CM | POA: Diagnosis not present

## 2018-11-28 ENCOUNTER — Other Ambulatory Visit: Payer: Self-pay | Admitting: Cardiology

## 2018-11-28 DIAGNOSIS — Z6841 Body Mass Index (BMI) 40.0 and over, adult: Secondary | ICD-10-CM | POA: Diagnosis not present

## 2018-11-28 DIAGNOSIS — J189 Pneumonia, unspecified organism: Secondary | ICD-10-CM | POA: Diagnosis not present

## 2018-11-28 DIAGNOSIS — R609 Edema, unspecified: Secondary | ICD-10-CM | POA: Diagnosis not present

## 2018-11-28 DIAGNOSIS — N183 Chronic kidney disease, stage 3 (moderate): Secondary | ICD-10-CM | POA: Diagnosis not present

## 2018-11-28 DIAGNOSIS — E1165 Type 2 diabetes mellitus with hyperglycemia: Secondary | ICD-10-CM | POA: Diagnosis not present

## 2018-11-28 DIAGNOSIS — I10 Essential (primary) hypertension: Secondary | ICD-10-CM | POA: Diagnosis not present

## 2018-11-28 DIAGNOSIS — I5032 Chronic diastolic (congestive) heart failure: Secondary | ICD-10-CM | POA: Diagnosis not present

## 2018-11-28 DIAGNOSIS — F419 Anxiety disorder, unspecified: Secondary | ICD-10-CM | POA: Diagnosis not present

## 2018-12-06 DIAGNOSIS — R634 Abnormal weight loss: Secondary | ICD-10-CM | POA: Diagnosis not present

## 2018-12-06 DIAGNOSIS — R110 Nausea: Secondary | ICD-10-CM | POA: Diagnosis not present

## 2018-12-06 DIAGNOSIS — J189 Pneumonia, unspecified organism: Secondary | ICD-10-CM | POA: Diagnosis not present

## 2018-12-06 DIAGNOSIS — Z6841 Body Mass Index (BMI) 40.0 and over, adult: Secondary | ICD-10-CM | POA: Diagnosis not present

## 2018-12-12 DIAGNOSIS — E1165 Type 2 diabetes mellitus with hyperglycemia: Secondary | ICD-10-CM | POA: Diagnosis not present

## 2018-12-12 DIAGNOSIS — E782 Mixed hyperlipidemia: Secondary | ICD-10-CM | POA: Diagnosis not present

## 2018-12-12 DIAGNOSIS — I10 Essential (primary) hypertension: Secondary | ICD-10-CM | POA: Diagnosis not present

## 2018-12-13 DIAGNOSIS — N200 Calculus of kidney: Secondary | ICD-10-CM | POA: Diagnosis not present

## 2018-12-13 DIAGNOSIS — I517 Cardiomegaly: Secondary | ICD-10-CM | POA: Diagnosis not present

## 2018-12-13 DIAGNOSIS — I700 Atherosclerosis of aorta: Secondary | ICD-10-CM | POA: Diagnosis not present

## 2018-12-13 DIAGNOSIS — R634 Abnormal weight loss: Secondary | ICD-10-CM | POA: Diagnosis not present

## 2018-12-13 DIAGNOSIS — R110 Nausea: Secondary | ICD-10-CM | POA: Diagnosis not present

## 2018-12-13 DIAGNOSIS — K4090 Unilateral inguinal hernia, without obstruction or gangrene, not specified as recurrent: Secondary | ICD-10-CM | POA: Diagnosis not present

## 2018-12-13 DIAGNOSIS — I2510 Atherosclerotic heart disease of native coronary artery without angina pectoris: Secondary | ICD-10-CM | POA: Diagnosis not present

## 2018-12-18 ENCOUNTER — Inpatient Hospital Stay (HOSPITAL_COMMUNITY)
Admission: EM | Admit: 2018-12-18 | Discharge: 2018-12-24 | DRG: 682 | Disposition: A | Discharge status: Skilled Nursing Facility | Payer: PPO | Attending: Internal Medicine | Admitting: Internal Medicine

## 2018-12-18 ENCOUNTER — Emergency Department (HOSPITAL_COMMUNITY): Payer: PPO

## 2018-12-18 ENCOUNTER — Encounter (HOSPITAL_COMMUNITY): Payer: Self-pay | Admitting: Emergency Medicine

## 2018-12-18 ENCOUNTER — Other Ambulatory Visit: Payer: Self-pay

## 2018-12-18 DIAGNOSIS — N183 Chronic kidney disease, stage 3 unspecified: Secondary | ICD-10-CM | POA: Diagnosis present

## 2018-12-18 DIAGNOSIS — E66813 Obesity, class 3: Secondary | ICD-10-CM | POA: Diagnosis present

## 2018-12-18 DIAGNOSIS — I959 Hypotension, unspecified: Secondary | ICD-10-CM | POA: Diagnosis present

## 2018-12-18 DIAGNOSIS — I5042 Chronic combined systolic (congestive) and diastolic (congestive) heart failure: Secondary | ICD-10-CM | POA: Diagnosis present

## 2018-12-18 DIAGNOSIS — I429 Cardiomyopathy, unspecified: Secondary | ICD-10-CM | POA: Diagnosis present

## 2018-12-18 DIAGNOSIS — E860 Dehydration: Secondary | ICD-10-CM | POA: Diagnosis not present

## 2018-12-18 DIAGNOSIS — Z9181 History of falling: Secondary | ICD-10-CM

## 2018-12-18 DIAGNOSIS — R918 Other nonspecific abnormal finding of lung field: Secondary | ICD-10-CM | POA: Diagnosis not present

## 2018-12-18 DIAGNOSIS — Z888 Allergy status to other drugs, medicaments and biological substances status: Secondary | ICD-10-CM

## 2018-12-18 DIAGNOSIS — G4733 Obstructive sleep apnea (adult) (pediatric): Secondary | ICD-10-CM | POA: Diagnosis not present

## 2018-12-18 DIAGNOSIS — N189 Chronic kidney disease, unspecified: Secondary | ICD-10-CM | POA: Diagnosis not present

## 2018-12-18 DIAGNOSIS — R2689 Other abnormalities of gait and mobility: Secondary | ICD-10-CM | POA: Diagnosis not present

## 2018-12-18 DIAGNOSIS — E785 Hyperlipidemia, unspecified: Secondary | ICD-10-CM | POA: Diagnosis present

## 2018-12-18 DIAGNOSIS — I454 Nonspecific intraventricular block: Secondary | ICD-10-CM | POA: Diagnosis present

## 2018-12-18 DIAGNOSIS — R0902 Hypoxemia: Secondary | ICD-10-CM | POA: Diagnosis not present

## 2018-12-18 DIAGNOSIS — J9611 Chronic respiratory failure with hypoxia: Secondary | ICD-10-CM | POA: Diagnosis present

## 2018-12-18 DIAGNOSIS — I493 Ventricular premature depolarization: Secondary | ICD-10-CM | POA: Diagnosis not present

## 2018-12-18 DIAGNOSIS — W19XXXA Unspecified fall, initial encounter: Secondary | ICD-10-CM | POA: Diagnosis not present

## 2018-12-18 DIAGNOSIS — E1122 Type 2 diabetes mellitus with diabetic chronic kidney disease: Secondary | ICD-10-CM

## 2018-12-18 DIAGNOSIS — F329 Major depressive disorder, single episode, unspecified: Secondary | ICD-10-CM | POA: Diagnosis present

## 2018-12-18 DIAGNOSIS — Z20828 Contact with and (suspected) exposure to other viral communicable diseases: Secondary | ICD-10-CM | POA: Diagnosis present

## 2018-12-18 DIAGNOSIS — N179 Acute kidney failure, unspecified: Secondary | ICD-10-CM | POA: Diagnosis not present

## 2018-12-18 DIAGNOSIS — N200 Calculus of kidney: Secondary | ICD-10-CM | POA: Diagnosis present

## 2018-12-18 DIAGNOSIS — K3184 Gastroparesis: Secondary | ICD-10-CM | POA: Diagnosis not present

## 2018-12-18 DIAGNOSIS — M1A30X0 Chronic gout due to renal impairment, unspecified site, without tophus (tophi): Secondary | ICD-10-CM | POA: Diagnosis present

## 2018-12-18 DIAGNOSIS — R531 Weakness: Secondary | ICD-10-CM | POA: Diagnosis not present

## 2018-12-18 DIAGNOSIS — Z9981 Dependence on supplemental oxygen: Secondary | ICD-10-CM | POA: Diagnosis not present

## 2018-12-18 DIAGNOSIS — R41841 Cognitive communication deficit: Secondary | ICD-10-CM | POA: Diagnosis not present

## 2018-12-18 DIAGNOSIS — G9341 Metabolic encephalopathy: Secondary | ICD-10-CM | POA: Diagnosis not present

## 2018-12-18 DIAGNOSIS — J449 Chronic obstructive pulmonary disease, unspecified: Secondary | ICD-10-CM | POA: Diagnosis present

## 2018-12-18 DIAGNOSIS — I200 Unstable angina: Secondary | ICD-10-CM | POA: Diagnosis present

## 2018-12-18 DIAGNOSIS — Z87442 Personal history of urinary calculi: Secondary | ICD-10-CM

## 2018-12-18 DIAGNOSIS — I25110 Atherosclerotic heart disease of native coronary artery with unstable angina pectoris: Secondary | ICD-10-CM | POA: Diagnosis not present

## 2018-12-18 DIAGNOSIS — Z955 Presence of coronary angioplasty implant and graft: Secondary | ICD-10-CM

## 2018-12-18 DIAGNOSIS — E782 Mixed hyperlipidemia: Secondary | ICD-10-CM | POA: Diagnosis not present

## 2018-12-18 DIAGNOSIS — Z03818 Encounter for observation for suspected exposure to other biological agents ruled out: Secondary | ICD-10-CM | POA: Diagnosis not present

## 2018-12-18 DIAGNOSIS — F410 Panic disorder [episodic paroxysmal anxiety] without agoraphobia: Secondary | ICD-10-CM | POA: Diagnosis present

## 2018-12-18 DIAGNOSIS — Z6841 Body Mass Index (BMI) 40.0 and over, adult: Secondary | ICD-10-CM | POA: Diagnosis not present

## 2018-12-18 DIAGNOSIS — I472 Ventricular tachycardia: Secondary | ICD-10-CM | POA: Diagnosis not present

## 2018-12-18 DIAGNOSIS — I129 Hypertensive chronic kidney disease with stage 1 through stage 4 chronic kidney disease, or unspecified chronic kidney disease: Secondary | ICD-10-CM | POA: Diagnosis not present

## 2018-12-18 DIAGNOSIS — M6281 Muscle weakness (generalized): Secondary | ICD-10-CM | POA: Diagnosis not present

## 2018-12-18 DIAGNOSIS — Z794 Long term (current) use of insulin: Secondary | ICD-10-CM

## 2018-12-18 DIAGNOSIS — Z7902 Long term (current) use of antithrombotics/antiplatelets: Secondary | ICD-10-CM

## 2018-12-18 DIAGNOSIS — Z7982 Long term (current) use of aspirin: Secondary | ICD-10-CM

## 2018-12-18 DIAGNOSIS — I10 Essential (primary) hypertension: Secondary | ICD-10-CM | POA: Diagnosis present

## 2018-12-18 DIAGNOSIS — I214 Non-ST elevation (NSTEMI) myocardial infarction: Secondary | ICD-10-CM | POA: Diagnosis not present

## 2018-12-18 DIAGNOSIS — E1143 Type 2 diabetes mellitus with diabetic autonomic (poly)neuropathy: Secondary | ICD-10-CM | POA: Diagnosis not present

## 2018-12-18 DIAGNOSIS — N19 Unspecified kidney failure: Secondary | ICD-10-CM | POA: Diagnosis not present

## 2018-12-18 DIAGNOSIS — Z87440 Personal history of urinary (tract) infections: Secondary | ICD-10-CM

## 2018-12-18 DIAGNOSIS — Z7401 Bed confinement status: Secondary | ICD-10-CM | POA: Diagnosis not present

## 2018-12-18 DIAGNOSIS — R4182 Altered mental status, unspecified: Secondary | ICD-10-CM | POA: Diagnosis present

## 2018-12-18 DIAGNOSIS — G4730 Sleep apnea, unspecified: Secondary | ICD-10-CM | POA: Diagnosis present

## 2018-12-18 DIAGNOSIS — Z66 Do not resuscitate: Secondary | ICD-10-CM | POA: Diagnosis not present

## 2018-12-18 DIAGNOSIS — R5381 Other malaise: Secondary | ICD-10-CM | POA: Diagnosis not present

## 2018-12-18 DIAGNOSIS — I130 Hypertensive heart and chronic kidney disease with heart failure and stage 1 through stage 4 chronic kidney disease, or unspecified chronic kidney disease: Secondary | ICD-10-CM | POA: Diagnosis not present

## 2018-12-18 DIAGNOSIS — Z8249 Family history of ischemic heart disease and other diseases of the circulatory system: Secondary | ICD-10-CM

## 2018-12-18 DIAGNOSIS — S199XXA Unspecified injury of neck, initial encounter: Secondary | ICD-10-CM | POA: Diagnosis not present

## 2018-12-18 DIAGNOSIS — S0990XA Unspecified injury of head, initial encounter: Secondary | ICD-10-CM | POA: Diagnosis not present

## 2018-12-18 DIAGNOSIS — R4781 Slurred speech: Secondary | ICD-10-CM | POA: Diagnosis present

## 2018-12-18 DIAGNOSIS — Z951 Presence of aortocoronary bypass graft: Secondary | ICD-10-CM

## 2018-12-18 DIAGNOSIS — I252 Old myocardial infarction: Secondary | ICD-10-CM | POA: Diagnosis not present

## 2018-12-18 DIAGNOSIS — Z86011 Personal history of benign neoplasm of the brain: Secondary | ICD-10-CM

## 2018-12-18 DIAGNOSIS — Z79899 Other long term (current) drug therapy: Secondary | ICD-10-CM

## 2018-12-18 LAB — URINALYSIS, ROUTINE W REFLEX MICROSCOPIC
Bilirubin Urine: NEGATIVE
Glucose, UA: NEGATIVE mg/dL
Hgb urine dipstick: NEGATIVE
Ketones, ur: NEGATIVE mg/dL
Leukocytes,Ua: NEGATIVE
Nitrite: NEGATIVE
Protein, ur: NEGATIVE mg/dL
Specific Gravity, Urine: 1.01 (ref 1.005–1.030)
pH: 5 (ref 5.0–8.0)

## 2018-12-18 LAB — COMPREHENSIVE METABOLIC PANEL
ALT: 10 U/L (ref 0–44)
AST: 12 U/L — ABNORMAL LOW (ref 15–41)
Albumin: 3.7 g/dL (ref 3.5–5.0)
Alkaline Phosphatase: 60 U/L (ref 38–126)
Anion gap: 11 (ref 5–15)
BUN: 153 mg/dL — ABNORMAL HIGH (ref 8–23)
CO2: 30 mmol/L (ref 22–32)
Calcium: 8.7 mg/dL — ABNORMAL LOW (ref 8.9–10.3)
Chloride: 92 mmol/L — ABNORMAL LOW (ref 98–111)
Creatinine, Ser: 3.52 mg/dL — ABNORMAL HIGH (ref 0.61–1.24)
GFR calc Af Amer: 18 mL/min — ABNORMAL LOW (ref >60)
GFR calc non Af Amer: 15 mL/min — ABNORMAL LOW (ref >60)
Glucose, Bld: 252 mg/dL — ABNORMAL HIGH (ref 70–99)
Potassium: 3.5 mmol/L (ref 3.5–5.1)
Sodium: 133 mmol/L — ABNORMAL LOW (ref 135–145)
Total Bilirubin: 0.3 mg/dL (ref 0.3–1.2)
Total Protein: 6.9 g/dL (ref 6.5–8.1)

## 2018-12-18 LAB — CBC WITH DIFFERENTIAL/PLATELET
Abs Immature Granulocytes: 0.04 10*3/uL (ref 0.00–0.07)
Basophils Absolute: 0 10*3/uL (ref 0.0–0.1)
Basophils Relative: 0 %
Eosinophils Absolute: 0.1 10*3/uL (ref 0.0–0.5)
Eosinophils Relative: 1 %
HCT: 38.7 % — ABNORMAL LOW (ref 39.0–52.0)
Hemoglobin: 11.8 g/dL — ABNORMAL LOW (ref 13.0–17.0)
Immature Granulocytes: 1 %
Lymphocytes Relative: 16 %
Lymphs Abs: 1.2 10*3/uL (ref 0.7–4.0)
MCH: 28.6 pg (ref 26.0–34.0)
MCHC: 30.5 g/dL (ref 30.0–36.0)
MCV: 93.7 fL (ref 80.0–100.0)
Monocytes Absolute: 0.7 10*3/uL (ref 0.1–1.0)
Monocytes Relative: 9 %
Neutro Abs: 5.8 10*3/uL (ref 1.7–7.7)
Neutrophils Relative %: 73 %
Platelets: 134 10*3/uL — ABNORMAL LOW (ref 150–400)
RBC: 4.13 MIL/uL — ABNORMAL LOW (ref 4.22–5.81)
RDW: 13.8 % (ref 11.5–15.5)
WBC: 7.9 10*3/uL (ref 4.0–10.5)
nRBC: 0 % (ref 0.0–0.2)

## 2018-12-18 LAB — AMMONIA: Ammonia: 24 umol/L (ref 9–35)

## 2018-12-18 LAB — LACTIC ACID, PLASMA: Lactic Acid, Venous: 1.3 mmol/L (ref 0.5–1.9)

## 2018-12-18 LAB — SARS CORONAVIRUS 2 BY RT PCR (HOSPITAL ORDER, PERFORMED IN ~~LOC~~ HOSPITAL LAB): SARS Coronavirus 2: NEGATIVE

## 2018-12-18 LAB — ETHANOL: Alcohol, Ethyl (B): <10 mg/dL (ref <10)

## 2018-12-18 LAB — CBG MONITORING, ED: Glucose-Capillary: 209 mg/dL — ABNORMAL HIGH (ref 70–99)

## 2018-12-18 LAB — GLUCOSE, CAPILLARY: Glucose-Capillary: 191 mg/dL — ABNORMAL HIGH (ref 70–99)

## 2018-12-18 MED ORDER — ACETAMINOPHEN 650 MG RE SUPP
650.0000 mg | Freq: Four times a day (QID) | RECTAL | Status: DC | PRN
  Filled 2018-12-18: qty 1

## 2018-12-18 MED ORDER — CAMPHOR-MENTHOL 0.5-0.5 % EX LOTN: 1.0000 "application " | TOPICAL_LOTION | Freq: Three times a day (TID) | CUTANEOUS | Status: DC | PRN

## 2018-12-18 MED ORDER — INSULIN ASPART 100 UNIT/ML ~~LOC~~ SOLN
0.0000 [IU] | Freq: Three times a day (TID) | SUBCUTANEOUS | Status: DC
  Administered 2018-12-19 (×3): 3 [IU] via SUBCUTANEOUS
  Administered 2018-12-20: 5 [IU] via SUBCUTANEOUS
  Administered 2018-12-20: 11 [IU] via SUBCUTANEOUS
  Administered 2018-12-20: 5 [IU] via SUBCUTANEOUS
  Administered 2018-12-21: 3 [IU] via SUBCUTANEOUS
  Administered 2018-12-21: 8 [IU] via SUBCUTANEOUS
  Administered 2018-12-21 – 2018-12-22 (×2): 3 [IU] via SUBCUTANEOUS
  Administered 2018-12-23: 2 [IU] via SUBCUTANEOUS
  Administered 2018-12-23 – 2018-12-24 (×5): 3 [IU] via SUBCUTANEOUS

## 2018-12-18 MED ORDER — TAMSULOSIN HCL 0.4 MG PO CAPS
0.4000 mg | ORAL_CAPSULE | Freq: Every evening | ORAL | Status: DC
  Administered 2018-12-19 – 2018-12-24 (×7): 0.4 mg via ORAL
  Filled 2018-12-18 (×7): qty 1

## 2018-12-18 MED ORDER — ENOXAPARIN SODIUM 30 MG/0.3ML ~~LOC~~ SOLN
30.0000 mg | SUBCUTANEOUS | Status: DC
  Administered 2018-12-19 – 2018-12-21 (×3): 30 mg via SUBCUTANEOUS
  Filled 2018-12-18 (×3): qty 0.3

## 2018-12-18 MED ORDER — ISOSORBIDE MONONITRATE ER 60 MG PO TB24
30.0000 mg | ORAL_TABLET | Freq: Two times a day (BID) | ORAL | Status: DC
  Administered 2018-12-19 – 2018-12-21 (×6): 30 mg via ORAL
  Filled 2018-12-18 (×6): qty 1

## 2018-12-18 MED ORDER — HYDROXYZINE HCL 25 MG PO TABS
25.0000 mg | ORAL_TABLET | Freq: Three times a day (TID) | ORAL | Status: DC | PRN
  Administered 2018-12-23: 25 mg via ORAL
  Filled 2018-12-18: qty 1

## 2018-12-18 MED ORDER — ZOLPIDEM TARTRATE 5 MG PO TABS
5.0000 mg | ORAL_TABLET | Freq: Every evening | ORAL | Status: DC | PRN
  Administered 2018-12-19 – 2018-12-23 (×6): 5 mg via ORAL
  Filled 2018-12-18 (×6): qty 1

## 2018-12-18 MED ORDER — CLOPIDOGREL BISULFATE 75 MG PO TABS
75.0000 mg | ORAL_TABLET | Freq: Every day | ORAL | Status: DC
  Administered 2018-12-19 – 2018-12-24 (×6): 75 mg via ORAL
  Filled 2018-12-18 (×6): qty 1

## 2018-12-18 MED ORDER — INSULIN ASPART 100 UNIT/ML ~~LOC~~ SOLN
0.0000 [IU] | Freq: Every day | SUBCUTANEOUS | Status: DC
  Administered 2018-12-21: 2 [IU] via SUBCUTANEOUS

## 2018-12-18 MED ORDER — DOCUSATE SODIUM 283 MG RE ENEM
1.0000 | ENEMA | RECTAL | Status: DC | PRN
  Filled 2018-12-18: qty 1

## 2018-12-18 MED ORDER — METOPROLOL TARTRATE 50 MG PO TABS
50.0000 mg | ORAL_TABLET | Freq: Two times a day (BID) | ORAL | Status: DC
  Administered 2018-12-19 – 2018-12-24 (×10): 50 mg via ORAL
  Filled 2018-12-18 (×11): qty 1

## 2018-12-18 MED ORDER — SODIUM CHLORIDE 0.9% FLUSH
3.0000 mL | Freq: Two times a day (BID) | INTRAVENOUS | Status: DC
  Administered 2018-12-18 – 2018-12-24 (×7): 3 mL via INTRAVENOUS

## 2018-12-18 MED ORDER — INSULIN GLARGINE 100 UNIT/ML ~~LOC~~ SOLN
60.0000 [IU] | Freq: Every day | SUBCUTANEOUS | Status: DC
  Administered 2018-12-19 – 2018-12-23 (×6): 60 [IU] via SUBCUTANEOUS
  Filled 2018-12-18 (×8): qty 0.6

## 2018-12-18 MED ORDER — ORAL CARE MOUTH RINSE
15.0000 mL | Freq: Two times a day (BID) | OROMUCOSAL | Status: DC
  Administered 2018-12-18 – 2018-12-24 (×10): 15 mL via OROMUCOSAL

## 2018-12-18 MED ORDER — ONDANSETRON HCL 4 MG PO TABS: 4.0000 mg | ORAL_TABLET | Freq: Four times a day (QID) | ORAL | Status: DC | PRN

## 2018-12-18 MED ORDER — CITALOPRAM HYDROBROMIDE 20 MG PO TABS
10.0000 mg | ORAL_TABLET | Freq: Every day | ORAL | Status: DC
  Administered 2018-12-19 – 2018-12-24 (×6): 10 mg via ORAL
  Filled 2018-12-18 (×6): qty 1

## 2018-12-18 MED ORDER — ASPIRIN EC 81 MG PO TBEC
81.0000 mg | DELAYED_RELEASE_TABLET | Freq: Every morning | ORAL | Status: DC
  Administered 2018-12-19 – 2018-12-24 (×6): 81 mg via ORAL
  Filled 2018-12-18 (×6): qty 1

## 2018-12-18 MED ORDER — PSYLLIUM 95 % PO PACK
1.0000 | PACK | Freq: Every day | ORAL | Status: DC
  Administered 2018-12-19 – 2018-12-22 (×4): 1 via ORAL
  Filled 2018-12-18 (×8): qty 1

## 2018-12-18 MED ORDER — SODIUM CHLORIDE 0.9 % IV BOLUS
500.0000 mL | Freq: Once | INTRAVENOUS | Status: AC
  Administered 2018-12-18: 500 mL via INTRAVENOUS

## 2018-12-18 MED ORDER — ACETAMINOPHEN 325 MG PO TABS
650.0000 mg | ORAL_TABLET | Freq: Four times a day (QID) | ORAL | Status: DC | PRN
  Administered 2018-12-19 – 2018-12-22 (×5): 650 mg via ORAL
  Filled 2018-12-18 (×6): qty 2

## 2018-12-18 MED ORDER — CHLORHEXIDINE GLUCONATE CLOTH 2 % EX PADS
6.0000 | MEDICATED_PAD | Freq: Every day | CUTANEOUS | Status: DC
  Administered 2018-12-19 – 2018-12-24 (×6): 6 via TOPICAL

## 2018-12-18 MED ORDER — NEPRO/CARBSTEADY PO LIQD: 237.0000 mL | Freq: Three times a day (TID) | ORAL | Status: DC | PRN

## 2018-12-18 MED ORDER — ALLOPURINOL 100 MG PO TABS
200.0000 mg | ORAL_TABLET | Freq: Every day | ORAL | Status: DC
  Administered 2018-12-19 – 2018-12-24 (×6): 200 mg via ORAL
  Filled 2018-12-18 (×6): qty 2

## 2018-12-18 MED ORDER — SORBITOL 70 % SOLN: 30.0000 mL | Status: DC | PRN

## 2018-12-18 MED ORDER — CALCIUM CARBONATE ANTACID 1250 MG/5ML PO SUSP
500.0000 mg | Freq: Four times a day (QID) | ORAL | Status: DC | PRN
  Administered 2018-12-19: 500 mg via ORAL
  Filled 2018-12-18 (×2): qty 5

## 2018-12-18 MED ORDER — LACTATED RINGERS IV SOLN
INTRAVENOUS | Status: DC
  Administered 2018-12-18 – 2018-12-20 (×3): via INTRAVENOUS

## 2018-12-18 MED ORDER — ONDANSETRON HCL 4 MG/2ML IJ SOLN
4.0000 mg | Freq: Four times a day (QID) | INTRAMUSCULAR | Status: DC | PRN
  Administered 2018-12-20 – 2018-12-23 (×5): 4 mg via INTRAVENOUS
  Filled 2018-12-18 (×5): qty 2

## 2018-12-18 MED ORDER — POLYETHYLENE GLYCOL 3350 17 G PO PACK: 17.0000 g | PACK | Freq: Every day | ORAL | Status: DC | PRN

## 2018-12-18 MED ORDER — SODIUM CHLORIDE 0.9 % IV BOLUS
1000.0000 mL | Freq: Once | INTRAVENOUS | Status: AC
  Administered 2018-12-18: 1000 mL via INTRAVENOUS

## 2018-12-18 NOTE — ED Provider Notes (Signed)
[facility] EMERGENCY DEPARTMENT Provider Note   CSN: [id number] Arrival date & time: 12/18/18  1659     History   Chief Complaint Chief Complaint  Patient presents with   Altered Mental Status    HPI [name] is a 85 y.o. male.     HPI  85 year old male presents with altered mental status/weakness.  History is taken from the patient, and while he is oriented to person, place, and time, he is a little confused as to why exactly he is here.  Report was that he fell 2 days ago which the patient confirms, stating that he fell backwards after losing his balance.  He does not think he lost consciousness.  He denies a headache.  He states he is having speech abnormality which she states has been going on for a month or so.  Family originally concern for a UTI.  Past Medical History:  Diagnosis Date   Allergic rhinitis    Anxiety    Arthritis    COPD (chronic obstructive pulmonary disease) (HCC)    Coronary atherosclerosis of native coronary artery    a. CABG x 4 in 1994. Previous stent placement to SVG to distal RCA;  b. DES to native Cx in 2006;  c. DES SVG to RCA 05/08/11; d. Inf MI/Cath/PCI: LM 25, LAD 100, LCX patent stent, 50 into OM, RI 60-70, RCA 100, LIMA->LAD nl, VG->Diag 100 old, VG->RCA/PL 99 (DES x 1), EF 50-55%.   Depression    With anxious features/hx panic attacks   Essential hypertension, benign    GERD (gastroesophageal reflux disease)    Hyperlipidemia    Not on statin 2/2 hx of intolerance.    Morbid obesity ([place])    Nephrolithiasis    Pneumonia    Sleep apnea    ST elevation myocardial infarction (STEMI) of inferior wall ([place])    4/14 - DES SVG to PDA   [facility]     Patient Active Problem List   Diagnosis Date Noted   AKI (acute kidney injury) ([place])    Bilateral lower extremity edema    HCAP (healthcare-associated pneumonia) 10/22/2018   Pressure injury of skin 10/22/2018   E. coli UTI 08/04/2018    CKD stage 3 due to type 2 diabetes mellitus ([place]) 07/27/2018   Chronic constipation 07/27/2018   COPD (chronic obstructive pulmonary disease) ([place]) 07/27/2018   BPH without urinary obstruction 07/27/2018   Chronic gout due to renal impairment without tophus 07/27/2018   Depression with anxiety 07/27/2018   Mild aortic stenosis 07/27/2018   Unstable angina ([place]) 07/22/2018   Demand ischemia ([place])    Secondary cardiomyopathy ([place])    Chest pain 07/21/2018   Elevated troponin 07/21/2018   Acute on chronic diastolic heart failure ([place])    CKD stage 3 secondary to diabetes ([place]) 01/01/2014   Chronic combined systolic and diastolic CHF (congestive heart failure) ([place]) 08/11/2012   CAD (coronary artery disease) of artery bypass graft 08/08/2012   Sleep apnea    Essential hypertension, benign    Obesity, Class III, BMI 40-49.9 (morbid obesity) ([place])    Hyperlipidemia    Type 2 diabetes mellitus with stage 3 chronic kidney disease, with long-term current use of insulin ([place])    Coronary artery disease involving native coronary artery of native heart with unstable angina pectoris [facility])     Past Surgical History:  Procedure Laterality Date   CORONARY ARTERY BYPASS GRAFT  1994   EYE SURGERY  LEFT HEART CATHETERIZATION WITH CORONARY ANGIOGRAM N/A 05/08/2011   Procedure: LEFT HEART CATHETERIZATION WITH CORONARY ANGIOGRAM;  Surgeon: [name], MD;  Location: [facility] CATH LAB;  Service: Cardiovascular;  Laterality: N/A;   LEFT HEART CATHETERIZATION WITH CORONARY ANGIOGRAM N/A 08/08/2012   Procedure: LEFT HEART CATHETERIZATION WITH CORONARY ANGIOGRAM;  Surgeon: [name], MD;  Location: [facility] CATH LAB;  Service: Cardiovascular;  Laterality: N/A;   PERCUTANEOUS CORONARY STENT INTERVENTION (PCI-S) Right 05/08/2011   Procedure: PERCUTANEOUS CORONARY STENT INTERVENTION (PCI-S);  Surgeon: [name], MD;  Location: [facility] CATH LAB;  Service: Cardiovascular;   Laterality: Right;   PERCUTANEOUS CORONARY STENT INTERVENTION (PCI-S) N/A 08/08/2012   Procedure: PERCUTANEOUS CORONARY STENT INTERVENTION (PCI-S);  Surgeon: [name], MD;  Location: [facility] CATH LAB;  Service: Cardiovascular;  Laterality: N/A;        Home Medications    Prior to Admission medications   Medication Sig Start Date End Date Taking? Authorizing Provider  acetaminophen (TYLENOL) 325 MG tablet Take 650 mg by mouth every 4 (four) hours as needed.   Yes [provider]  allopurinol (ZYLOPRIM) 100 MG tablet Take 2 tablets (200 mg total) by mouth daily. 08/17/18  Yes Gerlene Fee, NP  alprazolam Duanne Moron) 2 MG tablet Take 1 tablet by mouth 2 (two) times a day. 09/20/18  Yes [provider]  aspirin EC 81 MG tablet Take 81 mg by mouth every morning.   Yes [provider]  clopidogrel (PLAVIX) 75 MG tablet TAKE 1 TABLET BY MOUTH EVERY DAY WITH BREAKFAST 11/28/18  Yes Satira Sark, MD  escitalopram (LEXAPRO) 5 MG tablet Take 1 tablet by mouth daily. 11/29/18  Yes [provider]  hydrALAZINE (APRESOLINE) 10 MG tablet TAKE 1 TABLET(10 MG) BY MOUTH EVERY 8 HOURS 10/31/18  Yes Satira Sark, MD  insulin aspart (NOVOLOG) 100 UNIT/ML injection Inject 7 Units into the skin 3 (three) times daily with meals. Give only if eats 50% or more of meal. 08/17/18  Yes Gerlene Fee, NP  Insulin Glargine, 2 Unit Dial, (TOUJEO MAX SOLOSTAR) 300 UNIT/ML SOPN Inject 60 Units into the skin at bedtime. 08/17/18  Yes Gerlene Fee, NP  isosorbide mononitrate (IMDUR) 30 MG 24 hr tablet TAKE 1 TABLET(30 MG) BY MOUTH TWICE DAILY 10/19/18  Yes Satira Sark, MD  metoprolol tartrate (LOPRESSOR) 50 MG tablet TAKE 1 TABLET(50 MG) BY MOUTH TWICE DAILY 10/19/18  Yes Satira Sark, MD  nitroGLYCERIN (NITROLINGUAL) 0.4 MG/SPRAY spray Place 1 spray under the tongue every 5 (five) minutes x 3 doses as needed for chest pain. 08/17/18  Yes Gerlene Fee, NP    ondansetron (ZOFRAN) 4 MG tablet Take 1 tablet (4 mg total) by mouth every 8 (eight) hours as needed for nausea or vomiting. 08/17/18  Yes Gerlene Fee, NP  OXYGEN Inhale 2 L/min into the lungs continuous.   Yes [provider]  polyethylene glycol powder (GLYCOLAX/MIRALAX) powder Take 17 g by mouth daily as needed (constipation).  02/08/13  Yes [provider]  psyllium (METAMUCIL) 58.6 % powder Take 2 tsp by mouth: Mix with 8 ounces liquid and drink once a day   Yes [provider]  tamsulosin (FLOMAX) 0.4 MG CAPS capsule Take 1 capsule (0.4 mg total) by mouth every evening. 08/17/18  Yes Gerlene Fee, NP  torsemide (DEMADEX) 20 MG tablet Take 1 tablet (20 mg total) by mouth daily. Beginning 08/05/2018 10/26/18  Yes Barton Dubois, MD  UNABLE TO  FIND CPAP from home while sleeping, at previous home settings   Yes [provider]    Family History Family History  Problem Relation Age of Onset   Heart attack Mother        Age 48    Social History Social History   Tobacco Use   Smoking status: Never Smoker   Smokeless tobacco: Never Used  Substance Use Topics   Alcohol use: No    Alcohol/week: 0.0 standard drinks   Drug use: No     Allergies   Zocor [simvastatin] and Lipitor [atorvastatin]   Review of Systems Review of Systems  Constitutional: Negative for fever.  Respiratory: Negative for shortness of breath.   Cardiovascular: Negative for chest pain.  Gastrointestinal: Negative for vomiting.  Musculoskeletal: Negative for neck pain.  Neurological: Negative for headaches.  All other systems reviewed and are negative.    Physical Exam Updated Vital Signs BP 131/82    Pulse 71    Temp 98 F (36.7 C) (Oral)    Resp (!) 21    Ht 5\' 5"  (1.651 m)    Wt 117.9 kg    SpO2 93%    BMI 43.27 kg/m   Physical Exam Vitals signs and nursing note reviewed.  Constitutional:      Appearance: He is well-developed.  HENT:     Head:  Normocephalic.      Right Ear: External ear normal.     Left Ear: External ear normal.     Nose: Nose normal.     Mouth/Throat:     Mouth: Mucous membranes are dry.  Eyes:     General:        Right eye: No discharge.        Left eye: No discharge.     Extraocular Movements: Extraocular movements intact.     Pupils: Pupils are equal, round, and reactive to light.  Neck:     Musculoskeletal: Neck supple. No muscular tenderness.  Cardiovascular:     Rate and Rhythm: Normal rate and regular rhythm.     Heart sounds: Normal heart sounds.  Pulmonary:     Effort: Pulmonary effort is normal.     Breath sounds: Normal breath sounds.  Abdominal:     Palpations: Abdomen is soft.     Tenderness: There is no abdominal tenderness.  Skin:    General: Skin is warm and dry.  Neurological:     Mental Status: He is alert and oriented to person, place, and time.     Comments: CN 3-12 grossly intact. 5/5 strength in all 4 extremities. Grossly normal sensation. Normal finger to nose.   Psychiatric:        Mood and Affect: Mood is not anxious.        Speech: Speech is slurred.      ED Treatments / Results  Labs (all labs ordered are listed, but only abnormal results are displayed) Labs Reviewed  COMPREHENSIVE METABOLIC PANEL - Abnormal; Notable for the following components:      Result Value   Sodium 133 (*)    Chloride 92 (*)    Glucose, Bld 252 (*)    BUN 153 (*)    Creatinine, Ser 3.52 (*)    Calcium 8.7 (*)    AST 12 (*)    GFR calc non Af Amer 15 (*)    GFR calc Af Amer 18 (*)    All other components within normal limits  URINALYSIS, ROUTINE W REFLEX MICROSCOPIC -  Abnormal; Notable for the following components:   Color, Urine STRAW (*)    All other components within normal limits  CBC WITH DIFFERENTIAL/PLATELET - Abnormal; Notable for the following components:   RBC 4.13 (*)    Hemoglobin 11.8 (*)    HCT 38.7 (*)    Platelets 134 (*)    All other components within normal  limits  URINE CULTURE  SARS CORONAVIRUS 2 (TAT 6-24 HRS)  ETHANOL  AMMONIA  LACTIC ACID, PLASMA  LACTIC ACID, PLASMA  CBG MONITORING, ED  I-STAT CHEM 8, ED    EKG EKG Interpretation  Date/Time:  Sunday December 18 2018 17:52:27 EDT Ventricular Rate:  74 PR Interval:    QRS Duration: 130 QT Interval:  423 QTC Calculation: 470 R Axis:   112 Text Interpretation:  Sinus or ectopic atrial rhythm Multiple ventricular premature complexes Nonspecific intraventricular conduction delay besides PVCs, is similar to July 2020 Confirmed by Sherwood Gambler [phone]) on 12/18/2018 6:37:51 PM   Radiology Ct Head Wo Contrast  Result Date: 12/18/2018 CLINICAL DATA:  85 year old male with fall. EXAM: CT HEAD WITHOUT CONTRAST CT CERVICAL SPINE WITHOUT CONTRAST TECHNIQUE: Multidetector CT imaging of the head and cervical spine was performed following the standard protocol without intravenous contrast. Multiplanar CT image reconstructions of the cervical spine were also generated. COMPARISON:  Head CT dated 02/08/2017 FINDINGS: Evaluation of this exam is limited due to motion artifact. CT HEAD FINDINGS Brain: There is mild age-related atrophy and chronic microvascular ischemic changes. There is no acute intracranial hemorrhage. No mass effect or midline shift. No extra-axial fluid collection. Vascular: No hyperdense vessel or unexpected calcification. Skull: Normal. Negative for fracture or focal lesion. Sinuses/Orbits: No acute finding. Other: None CT CERVICAL SPINE FINDINGS Alignment: No acute subluxation. Skull base and vertebrae: No acute fracture. Soft tissues and spinal canal: No prevertebral fluid or swelling. No visible canal hematoma. Disc levels:  Multilevel degenerative changes. Upper chest: Negative. Other: Bilateral carotid bulb calcified plaques. IMPRESSION: 1. No acute intracranial hemorrhage. 2. No acute/traumatic cervical spine pathology. Electronically Signed   By: Anner Crete M.D.   On:  12/18/2018 19:04   Ct Cervical Spine Wo Contrast  Result Date: 12/18/2018 CLINICAL DATA:  85 year old male with fall. EXAM: CT HEAD WITHOUT CONTRAST CT CERVICAL SPINE WITHOUT CONTRAST TECHNIQUE: Multidetector CT imaging of the head and cervical spine was performed following the standard protocol without intravenous contrast. Multiplanar CT image reconstructions of the cervical spine were also generated. COMPARISON:  Head CT dated 02/08/2017 FINDINGS: Evaluation of this exam is limited due to motion artifact. CT HEAD FINDINGS Brain: There is mild age-related atrophy and chronic microvascular ischemic changes. There is no acute intracranial hemorrhage. No mass effect or midline shift. No extra-axial fluid collection. Vascular: No hyperdense vessel or unexpected calcification. Skull: Normal. Negative for fracture or focal lesion. Sinuses/Orbits: No acute finding. Other: None CT CERVICAL SPINE FINDINGS Alignment: No acute subluxation. Skull base and vertebrae: No acute fracture. Soft tissues and spinal canal: No prevertebral fluid or swelling. No visible canal hematoma. Disc levels:  Multilevel degenerative changes. Upper chest: Negative. Other: Bilateral carotid bulb calcified plaques. IMPRESSION: 1. No acute intracranial hemorrhage. 2. No acute/traumatic cervical spine pathology. Electronically Signed   By: Anner Crete M.D.   On: 12/18/2018 19:04   Dg Chest Portable 1 View  Result Date: 12/18/2018 CLINICAL DATA:  AMS EXAM: PORTABLE CHEST 1 VIEW COMPARISON:  Chest radiographs 12/06/2018, 10/22/2018 FINDINGS: Stable cardiomediastinal contours status post median sternotomy. Cardiomegaly. Aortic arch calcification. There is  central vascular congestion. There are right basilar opacities favored to represent atelectasis. There is opacification of the left hemidiaphragm which may represent atelectasis, infiltrate not excluded. No pneumothorax. No acute osseous abnormality in the visualized skeleton. IMPRESSION: 1.  Right basilar opacities favored to represent atelectasis. 2. Opacification of the left hemidiaphragm may represent atelectasis, infiltrate not excluded. This could be better assessed with PA and lateral radiographs. 3. Cardiomegaly with central venous congestion. Electronically Signed   By: Audie Pinto M.D.   On: 12/18/2018 18:13    Procedures .Critical Care Performed by: Sherwood Gambler, MD Authorized by: Sherwood Gambler, MD   Critical care provider statement:    Critical care time (minutes):  35   Critical care time was exclusive of:  Separately billable procedures and treating other patients   Critical care was necessary to treat or prevent imminent or life-threatening deterioration of the following conditions:  Renal failure   Critical care was time spent personally by me on the following activities:  Discussions with consultants, evaluation of patient's response to treatment, examination of patient, ordering and performing treatments and interventions, ordering and review of laboratory studies, ordering and review of radiographic studies, pulse oximetry, re-evaluation of patient's condition, obtaining history from patient or surrogate and review of old charts   (including critical care time)  Medications Ordered in ED Medications  sodium chloride 0.9 % bolus 500 mL (0 mLs Intravenous Stopped 12/18/18 1854)  sodium chloride 0.9 % bolus 1,000 mL (1,000 mLs Intravenous New Bag/Given 12/18/18 1920)     Initial Impression / Assessment and Plan / ED Course  I have reviewed the triage vital signs and the nursing notes.  Pertinent labs & imaging results that were available during my care of the patient were reviewed by me and considered in my medical decision making (see chart for details).        Patient's lab work shows significant uremia with a BUN of 150 and acute on chronic kidney disease.  I did discuss with nephrology on-call, Dr. Jonnie Finner, who advises that he does not need  dialysis or transfer to [facility].  Can be hydrated and reassessed.  The son at the bedside tells me that the patient has not been eating and drinking very well and decreased urine output.  Generally weak.  There is also the concern of the head injury but CT head is benign.  I think the chest x-ray is likely represents atelectasis as he is not having cough or shortness of breath or fever. Dr Lorin Mercy will admit.  [name] was evaluated in Emergency Department on 12/18/2018 for the symptoms described in the history of present illness. He was evaluated in the context of the global COVID-19 pandemic, which necessitated consideration that the patient might be at risk for infection with the SARS-CoV-2 virus that causes COVID-19. Institutional protocols and algorithms that pertain to the evaluation of patients at risk for COVID-19 are in a state of rapid change based on information released by regulatory bodies including the CDC and federal and state organizations. These policies and algorithms were followed during the patient's care in the ED.   Final Clinical Impressions(s) / ED Diagnoses   Final diagnoses:  Acute kidney injury superimposed on chronic kidney disease ([place])  Uremia    ED Discharge Orders    None       Sherwood Gambler, MD 12/18/18 2027

## 2018-12-18 NOTE — H&P (Signed)
History and Physical    [name] [id number] DOB: June 29, 1933 DOA: 12/18/2018  PCP: [name], MD Consultants:  [name] - cardiology Patient coming from:  Home; NOK: Son  Chief Complaint: AMS  HPI: [name] is a 85 y.o. male with medical history significant of DM; CAD s/p stents; OSA; morbid obesity (BMI 41); HTN; HLD; and COPD presenting with AMS s/p fall.  Patient is a poor historian and is unaccompanied.  He reports that he came because his son made him.  He fell days to a week ago and has had increasing confusion since.  He has not been taking much PO intake and is unsure why.  He has noticed mild slurring of speech.  BP 87/41 while I was there, MAP 61.  He was last admitted from 7/11-14 for LLL PNA and acute on chronic renal failure with d/c creatinine 2.3.  ED Course:   Weakness, dehydration, markedly elevated BUN (but 100 last admission), CKD, decreased PO intake, not altered.  Head CT ok despite recent fall.  CXR ok and no respiratory symptoms.  Dr. Jonnie Finner suggests observation, IVF, monitoring.  Mildly uremic.  Review of Systems: As per HPI; otherwise review of systems reviewed and negative.  May be unreliable given AMS.    Past Medical History:  Diagnosis Date   Allergic rhinitis    Anxiety    Arthritis    COPD (chronic obstructive pulmonary disease) (HCC)    Coronary atherosclerosis of native coronary artery    a. CABG x 4 in 1994. Previous stent placement to SVG to distal RCA;  b. DES to native Cx in 2006;  c. DES SVG to RCA 05/08/11; d. Inf MI/Cath/PCI: LM 25, LAD 100, LCX patent stent, 50 into OM, RI 60-70, RCA 100, LIMA->LAD nl, VG->Diag 100 old, VG->RCA/PL 99 (DES x 1), EF 50-55%.   Depression    With anxious features/hx panic attacks   Essential hypertension, benign    GERD (gastroesophageal reflux disease)    Hyperlipidemia    Not on statin 2/2 hx of intolerance.    Morbid obesity ([place])    Nephrolithiasis    Pneumonia    Sleep  apnea    ST elevation myocardial infarction (STEMI) of inferior wall ([place])    4/14 - DES SVG to PDA   Type 2 diabetes mellitus ([place])     Past Surgical History:  Procedure Laterality Date   CORONARY ARTERY BYPASS GRAFT  1994   EYE SURGERY     LEFT HEART CATHETERIZATION WITH CORONARY ANGIOGRAM N/A 05/08/2011   Procedure: LEFT HEART CATHETERIZATION WITH CORONARY ANGIOGRAM;  Surgeon: [name], MD;  Location: [facility] CATH LAB;  Service: Cardiovascular;  Laterality: N/A;   LEFT HEART CATHETERIZATION WITH CORONARY ANGIOGRAM N/A 08/08/2012   Procedure: LEFT HEART CATHETERIZATION WITH CORONARY ANGIOGRAM;  Surgeon: [name], MD;  Location: [facility] CATH LAB;  Service: Cardiovascular;  Laterality: N/A;   PERCUTANEOUS CORONARY STENT INTERVENTION (PCI-S) Right 05/08/2011   Procedure: PERCUTANEOUS CORONARY STENT INTERVENTION (PCI-S);  Surgeon: [name], MD;  Location[facility] CATH LAB;  Service: Cardiovascular;  Laterality: Right;   PERCUTANEOUS CORONARY STENT INTERVENTION (PCI-S) N/A 08/08/2012   Procedure: PERCUTANEOUS CORONARY STENT INTERVENTION (PCI-S);  Surgeon: [name], MD;  Location: [facility] CATH LAB;  Service: Cardiovascular;  Laterality: N/A;    Social History   Socioeconomic History   Marital status: Married    Spouse name: Not on file   Number of children: Not on file   Years  of education: Not on file   Highest education level: Not on file  Occupational History   Not on file  Social Needs   Financial resource strain: Not on file   Food insecurity    Worry: Not on file    Inability: Not on file   Transportation needs    Medical: Not on file    Non-medical: Not on file  Tobacco Use   Smoking status: Never Smoker   Smokeless tobacco: Never Used  Substance and Sexual Activity   Alcohol use: No    Alcohol/week: 0.0 standard drinks   Drug use: No   Sexual activity: Never  Lifestyle   Physical activity    Days per week: Not on file     Minutes per session: Not on file   Stress: Not on file  Relationships   Social connections    Talks on phone: Not on file    Gets together: Not on file    Attends religious service: Not on file    Active member of club or organization: Not on file    Attends meetings of clubs or organizations: Not on file    Relationship status: Not on file   Intimate partner violence    Fear of current or ex partner: Not on file    Emotionally abused: Not on file    Physically abused: Not on file    Forced sexual activity: Not on file  Other Topics Concern   Not on file  Social History Narrative   Not on file    Allergies  Allergen Reactions   Zocor [Simvastatin] Other (See Comments)    fatigue   Lipitor [Atorvastatin] Other (See Comments)    Muscle cramping    Family History  Problem Relation Age of Onset   Heart attack Mother        Age 23    Prior to Admission medications   Medication Sig Start Date End Date Taking? Authorizing Provider  acetaminophen (TYLENOL) 325 MG tablet Take 650 mg by mouth every 4 (four) hours as needed.   Yes [provider]  allopurinol (ZYLOPRIM) 100 MG tablet Take 2 tablets (200 mg total) by mouth daily. 08/17/18  Yes Gerlene Fee, NP  alprazolam Duanne Moron) 2 MG tablet Take 1 tablet by mouth 2 (two) times a day. 09/20/18  Yes [provider]  aspirin EC 81 MG tablet Take 81 mg by mouth every morning.   Yes [provider]  clopidogrel (PLAVIX) 75 MG tablet TAKE 1 TABLET BY MOUTH EVERY DAY WITH BREAKFAST 11/28/18  Yes Satira Sark, MD  escitalopram (LEXAPRO) 5 MG tablet Take 1 tablet by mouth daily. 11/29/18  Yes [provider]  hydrALAZINE (APRESOLINE) 10 MG tablet TAKE 1 TABLET(10 MG) BY MOUTH EVERY 8 HOURS 10/31/18  Yes Satira Sark, MD  insulin aspart (NOVOLOG) 100 UNIT/ML injection Inject 7 Units into the skin 3 (three) times daily with meals. Give only if eats 50% or more of meal. 08/17/18  Yes Gerlene Fee, NP  Insulin Glargine, 2 Unit Dial, (TOUJEO MAX SOLOSTAR) 300 UNIT/ML SOPN Inject 60 Units into the skin at bedtime. 08/17/18  Yes Gerlene Fee, NP  isosorbide mononitrate (IMDUR) 30 MG 24 hr tablet TAKE 1 TABLET(30 MG) BY MOUTH TWICE DAILY 10/19/18  Yes Satira Sark, MD  metoprolol tartrate (LOPRESSOR) 50 MG tablet TAKE 1 TABLET(50 MG) BY MOUTH TWICE DAILY 10/19/18  Yes Satira Sark, MD  nitroGLYCERIN (NITROLINGUAL) 0.4 MG/SPRAY  spray Place 1 spray under the tongue every 5 (five) minutes x 3 doses as needed for chest pain. 08/17/18  Yes Gerlene Fee, NP  ondansetron (ZOFRAN) 4 MG tablet Take 1 tablet (4 mg total) by mouth every 8 (eight) hours as needed for nausea or vomiting. 08/17/18  Yes Gerlene Fee, NP  OXYGEN Inhale 2 L/min into the lungs continuous.   Yes [provider]  polyethylene glycol powder (GLYCOLAX/MIRALAX) powder Take 17 g by mouth daily as needed (constipation).  02/08/13  Yes [provider]  psyllium (METAMUCIL) 58.6 % powder Take 2 tsp by mouth: Mix with 8 ounces liquid and drink once a day   Yes [provider]  tamsulosin (FLOMAX) 0.4 MG CAPS capsule Take 1 capsule (0.4 mg total) by mouth every evening. 08/17/18  Yes Gerlene Fee, NP  torsemide (DEMADEX) 20 MG tablet Take 1 tablet (20 mg total) by mouth daily. Beginning 08/05/2018 10/26/18  Yes Barton Dubois, MD  UNABLE TO FIND CPAP from home while sleeping, at previous home settings   Yes [provider]    Physical Exam: Vitals:   12/18/18 1800 12/18/18 1830 12/18/18 1900 12/18/18 1930  BP: (!) 153/69 (!) 132/55 131/82 (!) 137/111  Pulse: 72 73 71 68  Resp: (!) 28 15 (!) 21 15  Temp:      TempSrc:      SpO2: 98% 99% 93% 95%  Weight:      Height:          General:  Appears calm and comfortable and is NAD with apparent cognitive slowing  Eyes:  PERRL, EOMI, normal lids, iris  ENT:  grossly normal hearing, lips & tongue, moderately dry mm  Neck:   no LAD, masses or thyromegaly  Cardiovascular:  RRR, no m/r/g. 1-2+ LE edema.   Respiratory:   CTA bilaterally with no wheezes/rales/rhonchi.  Normal respiratory effort.  Abdomen:  soft, NT, ND, NABS  Skin:  no rash or induration seen on limited exam  Musculoskeletal:  grossly normal tone BUE/BLE, good ROM, no bony abnormality  Psychiatric:  Mild cognitive slowing but oriented x 3 (couldn't name Labor Day but knows year); mild dysarthria  Neurologic:  CN 2-12 grossly intact, moves all extremities in coordinated fashion    Radiological Exams on Admission: Ct Head Wo Contrast  Result Date: 12/18/2018 CLINICAL DATA:  85 year old male with fall. EXAM: CT HEAD WITHOUT CONTRAST CT CERVICAL SPINE WITHOUT CONTRAST TECHNIQUE: Multidetector CT imaging of the head and cervical spine was performed following the standard protocol without intravenous contrast. Multiplanar CT image reconstructions of the cervical spine were also generated. COMPARISON:  Head CT dated 02/08/2017 FINDINGS: Evaluation of this exam is limited due to motion artifact. CT HEAD FINDINGS Brain: There is mild age-related atrophy and chronic microvascular ischemic changes. There is no acute intracranial hemorrhage. No mass effect or midline shift. No extra-axial fluid collection. Vascular: No hyperdense vessel or unexpected calcification. Skull: Normal. Negative for fracture or focal lesion. Sinuses/Orbits: No acute finding. Other: None CT CERVICAL SPINE FINDINGS Alignment: No acute subluxation. Skull base and vertebrae: No acute fracture. Soft tissues and spinal canal: No prevertebral fluid or swelling. No visible canal hematoma. Disc levels:  Multilevel degenerative changes. Upper chest: Negative. Other: Bilateral carotid bulb calcified plaques. IMPRESSION: 1. No acute intracranial hemorrhage. 2. No acute/traumatic cervical spine pathology. Electronically Signed   By: Anner Crete M.D.   On: 12/18/2018 19:04   Ct Cervical Spine Wo  Contrast  Result Date: 12/18/2018  CLINICAL DATA:  85 year old male with fall. EXAM: CT HEAD WITHOUT CONTRAST CT CERVICAL SPINE WITHOUT CONTRAST TECHNIQUE: Multidetector CT imaging of the head and cervical spine was performed following the standard protocol without intravenous contrast. Multiplanar CT image reconstructions of the cervical spine were also generated. COMPARISON:  Head CT dated 02/08/2017 FINDINGS: Evaluation of this exam is limited due to motion artifact. CT HEAD FINDINGS Brain: There is mild age-related atrophy and chronic microvascular ischemic changes. There is no acute intracranial hemorrhage. No mass effect or midline shift. No extra-axial fluid collection. Vascular: No hyperdense vessel or unexpected calcification. Skull: Normal. Negative for fracture or focal lesion. Sinuses/Orbits: No acute finding. Other: None CT CERVICAL SPINE FINDINGS Alignment: No acute subluxation. Skull base and vertebrae: No acute fracture. Soft tissues and spinal canal: No prevertebral fluid or swelling. No visible canal hematoma. Disc levels:  Multilevel degenerative changes. Upper chest: Negative. Other: Bilateral carotid bulb calcified plaques. IMPRESSION: 1. No acute intracranial hemorrhage. 2. No acute/traumatic cervical spine pathology. Electronically Signed   By: Anner Crete M.D.   On: 12/18/2018 19:04   Dg Chest Portable 1 View  Result Date: 12/18/2018 CLINICAL DATA:  AMS EXAM: PORTABLE CHEST 1 VIEW COMPARISON:  Chest radiographs 12/06/2018, 10/22/2018 FINDINGS: Stable cardiomediastinal contours status post median sternotomy. Cardiomegaly. Aortic arch calcification. There is central vascular congestion. There are right basilar opacities favored to represent atelectasis. There is opacification of the left hemidiaphragm which may represent atelectasis, infiltrate not excluded. No pneumothorax. No acute osseous abnormality in the visualized skeleton. IMPRESSION: 1. Right basilar opacities favored to  represent atelectasis. 2. Opacification of the left hemidiaphragm may represent atelectasis, infiltrate not excluded. This could be better assessed with PA and lateral radiographs. 3. Cardiomegaly with central venous congestion. Electronically Signed   By: Audie Pinto M.D.   On: 12/18/2018 18:13    EKG: Independently reviewed.  NSR with rate 74; IVCD with no evidence of acute ischemia   Labs on Admission: I have personally reviewed the available labs and imaging studies at the time of the admission.  Pertinent labs:   Na++ 133 Glucose 252 BUN 153/Creatinine 3.52/GFR 15; 102/2.36/25 on 7/13 NH4 24 Lactate 1.3 WBC 7.9 Hgb 11.8 Platelets 134 UA unremarkable ETOH <10   Assessment/Plan Principal Problem:   Dehydration Active Problems:   Sleep apnea   Essential hypertension, benign   Obesity, Class III, BMI 40-49.9 (morbid obesity) ([place])   Hyperlipidemia   Type 2 diabetes mellitus with stage 3 chronic kidney disease, with long-term current use of insulin (HCC)   Chronic combined systolic and diastolic CHF (congestive heart failure) (HCC)   COPD (chronic obstructive pulmonary disease) (HCC)   Acute metabolic encephalopathy   Acute renal failure superimposed on stage 3 chronic kidney disease (HCC)   Dehydration -Patient with baseline stage 3-4 CKD presenting with confusion after a fall -Patient previously with BUN 100, now 153 -Appears to be associated with decreased PO intake and patient looks to be dry -Will rehydrate overnight and reassess in AM -BP with MAP 61 while I was assessing the patient; will observe in SDU for now  Acute metabolic encephalopathy -Appears to be associated with dehydration and uremia -CT head negative despite recent fall -However, patient does have h/o CVD (also h/o meningioma); if not improving with improvement in dehydration, consider MRI to evaluate for stroke -Hold Xanax for now but continue Lexapro (formulary substitution for  Celexa) -Continue ASA and Plavix  Acute renal failure on stage 3-4 CKD -Baseline GFR 25, currently 15 -Likely  prerenal azotemia, as noted above -Patient was d/w Dr. Jonnie Finner by EDP, and he recommends IVF and monitoring here at [name] -Will trend BMP after ongoing overnight hydration -Hold Demadex  HTN -Hold hydralazine due to low BP in the ER, as above -Resume Lopressor tomorrow AM  HLD -Patient has reported allergies to Zocor and Lipitor and so does not appear to be taking any lipid-lowering medications -LDL in 2016 was 123 -Suggest consideration of trial of pravastatin (lipophilic and so perhaps better tolerated) and/or beyond red yeast rice (although limited utility)  DM -A1c in 4/20 was 11.5, indicating very poor control; will repeat -Continue Toujeo -Will cover with moderate-scale SSI  COPD -Continue home O2 at 2L  Chronic combined CHF -4/9 Echo with EF 40-45% and impaired relaxation NOS -Appears to have mild LE edema but generally appears volume deficient -Will need to monitor for evidence of worsening volume overload  Obesity -BMI 43 -Suggest bariatric medicine and/or surgery referral as an outpatient  OSA -Continue qhs CPAP    Note: This patient has been tested and is pending for the novel coronavirus COVID-19.  DVT prophylaxis:  Lovenox  Code Status: DNR - based on prior code status since 2018 Family Communication: None present, but son was present at bedside and discussed the situation with Dr. Regenia Skeeter  Disposition Plan:  Home once clinically improved Consults called: None  Admission status: It is my clinical opinion that referral for OBSERVATION is reasonable and necessary in this patient based on the above information provided. The aforementioned taken together are felt to place the patient at high risk for further clinical deterioration. However it is anticipated that the patient may be medically stable for discharge from the hospital within 24 to 48  hours.    Karmen Bongo MD [facility]   How to contact the [facility] Attending or Consulting provider [place] or covering provider during after hours [place], for this patient?  1. Check the care team in [facility] and look for a) attending/consulting [facility] provider listed and b) the [facility] team listed 2. Log into [email or web address] and use [place] universal password to access. If you do not have the password, please contact the hospital operator. 3. Locate the [facility] provider you are looking for under [facility] and page to a number that you can be directly reached. 4. If you still have difficulty reaching the provider, please page the [facility] (Director on Call) for the Hospitalists listed on amion for assistance.   12/18/2018, 8:20 PM

## 2018-12-18 NOTE — ED Triage Notes (Signed)
Pt fell 2 days ago, striking head. Pt has been altered since he fell. Family thinks possibly uti as he can become confused when he has them.

## 2018-12-18 NOTE — ED Notes (Signed)
Patient's bed and pants soiled from urine. Cleaned patient and bedding with NT assist. Patient tolerated well.

## 2018-12-19 DIAGNOSIS — E860 Dehydration: Secondary | ICD-10-CM | POA: Diagnosis present

## 2018-12-19 DIAGNOSIS — I959 Hypotension, unspecified: Secondary | ICD-10-CM | POA: Diagnosis present

## 2018-12-19 DIAGNOSIS — G9341 Metabolic encephalopathy: Secondary | ICD-10-CM | POA: Diagnosis not present

## 2018-12-19 DIAGNOSIS — Z66 Do not resuscitate: Secondary | ICD-10-CM | POA: Diagnosis not present

## 2018-12-19 DIAGNOSIS — I130 Hypertensive heart and chronic kidney disease with heart failure and stage 1 through stage 4 chronic kidney disease, or unspecified chronic kidney disease: Secondary | ICD-10-CM | POA: Diagnosis not present

## 2018-12-19 DIAGNOSIS — Z6841 Body Mass Index (BMI) 40.0 and over, adult: Secondary | ICD-10-CM | POA: Diagnosis not present

## 2018-12-19 DIAGNOSIS — N200 Calculus of kidney: Secondary | ICD-10-CM | POA: Diagnosis present

## 2018-12-19 DIAGNOSIS — I10 Essential (primary) hypertension: Secondary | ICD-10-CM

## 2018-12-19 DIAGNOSIS — I429 Cardiomyopathy, unspecified: Secondary | ICD-10-CM | POA: Diagnosis not present

## 2018-12-19 DIAGNOSIS — Z20828 Contact with and (suspected) exposure to other viral communicable diseases: Secondary | ICD-10-CM | POA: Diagnosis not present

## 2018-12-19 DIAGNOSIS — E1122 Type 2 diabetes mellitus with diabetic chronic kidney disease: Secondary | ICD-10-CM

## 2018-12-19 DIAGNOSIS — I472 Ventricular tachycardia: Secondary | ICD-10-CM | POA: Diagnosis not present

## 2018-12-19 DIAGNOSIS — G4730 Sleep apnea, unspecified: Secondary | ICD-10-CM

## 2018-12-19 DIAGNOSIS — I5042 Chronic combined systolic (congestive) and diastolic (congestive) heart failure: Secondary | ICD-10-CM | POA: Diagnosis not present

## 2018-12-19 DIAGNOSIS — N183 Chronic kidney disease, stage 3 (moderate): Secondary | ICD-10-CM | POA: Diagnosis not present

## 2018-12-19 DIAGNOSIS — E785 Hyperlipidemia, unspecified: Secondary | ICD-10-CM | POA: Diagnosis present

## 2018-12-19 DIAGNOSIS — E1143 Type 2 diabetes mellitus with diabetic autonomic (poly)neuropathy: Secondary | ICD-10-CM | POA: Diagnosis not present

## 2018-12-19 DIAGNOSIS — Z9981 Dependence on supplemental oxygen: Secondary | ICD-10-CM | POA: Diagnosis not present

## 2018-12-19 DIAGNOSIS — K3184 Gastroparesis: Secondary | ICD-10-CM | POA: Diagnosis not present

## 2018-12-19 DIAGNOSIS — I214 Non-ST elevation (NSTEMI) myocardial infarction: Secondary | ICD-10-CM | POA: Diagnosis not present

## 2018-12-19 DIAGNOSIS — N19 Unspecified kidney failure: Secondary | ICD-10-CM

## 2018-12-19 DIAGNOSIS — I25110 Atherosclerotic heart disease of native coronary artery with unstable angina pectoris: Secondary | ICD-10-CM | POA: Diagnosis not present

## 2018-12-19 DIAGNOSIS — R4182 Altered mental status, unspecified: Secondary | ICD-10-CM | POA: Diagnosis present

## 2018-12-19 DIAGNOSIS — N179 Acute kidney failure, unspecified: Secondary | ICD-10-CM | POA: Diagnosis not present

## 2018-12-19 DIAGNOSIS — J449 Chronic obstructive pulmonary disease, unspecified: Secondary | ICD-10-CM

## 2018-12-19 DIAGNOSIS — J9611 Chronic respiratory failure with hypoxia: Secondary | ICD-10-CM | POA: Diagnosis not present

## 2018-12-19 DIAGNOSIS — I454 Nonspecific intraventricular block: Secondary | ICD-10-CM | POA: Diagnosis present

## 2018-12-19 DIAGNOSIS — I252 Old myocardial infarction: Secondary | ICD-10-CM | POA: Diagnosis not present

## 2018-12-19 DIAGNOSIS — I493 Ventricular premature depolarization: Secondary | ICD-10-CM | POA: Diagnosis present

## 2018-12-19 DIAGNOSIS — Z794 Long term (current) use of insulin: Secondary | ICD-10-CM

## 2018-12-19 LAB — CBC
HCT: 37.3 % — ABNORMAL LOW (ref 39.0–52.0)
Hemoglobin: 11.5 g/dL — ABNORMAL LOW (ref 13.0–17.0)
MCH: 29 pg (ref 26.0–34.0)
MCHC: 30.8 g/dL (ref 30.0–36.0)
MCV: 94.2 fL (ref 80.0–100.0)
Platelets: 136 10*3/uL — ABNORMAL LOW (ref 150–400)
RBC: 3.96 MIL/uL — ABNORMAL LOW (ref 4.22–5.81)
RDW: 14 % (ref 11.5–15.5)
WBC: 7.1 10*3/uL (ref 4.0–10.5)
nRBC: 0 % (ref 0.0–0.2)

## 2018-12-19 LAB — BASIC METABOLIC PANEL
Anion gap: 10 (ref 5–15)
BUN: 137 mg/dL — ABNORMAL HIGH (ref 8–23)
CO2: 30 mmol/L (ref 22–32)
Calcium: 8.9 mg/dL (ref 8.9–10.3)
Chloride: 98 mmol/L (ref 98–111)
Creatinine, Ser: 2.81 mg/dL — ABNORMAL HIGH (ref 0.61–1.24)
GFR calc Af Amer: 23 mL/min — ABNORMAL LOW (ref >60)
GFR calc non Af Amer: 20 mL/min — ABNORMAL LOW (ref >60)
Glucose, Bld: 183 mg/dL — ABNORMAL HIGH (ref 70–99)
Potassium: 3 mmol/L — ABNORMAL LOW (ref 3.5–5.1)
Sodium: 138 mmol/L (ref 135–145)

## 2018-12-19 LAB — GLUCOSE, CAPILLARY
Glucose-Capillary: 152 mg/dL — ABNORMAL HIGH (ref 70–99)
Glucose-Capillary: 178 mg/dL — ABNORMAL HIGH (ref 70–99)
Glucose-Capillary: 190 mg/dL — ABNORMAL HIGH (ref 70–99)
Glucose-Capillary: 193 mg/dL — ABNORMAL HIGH (ref 70–99)

## 2018-12-19 LAB — MRSA PCR SCREENING: MRSA by PCR: POSITIVE — AB

## 2018-12-19 LAB — URINE CULTURE: Culture: NO GROWTH

## 2018-12-19 LAB — HEMOGLOBIN A1C
Hgb A1c MFr Bld: 8.8 % — ABNORMAL HIGH (ref 4.8–5.6)
Mean Plasma Glucose: 205.86 mg/dL

## 2018-12-19 MED ORDER — MUPIROCIN 2 % EX OINT
1.0000 "application " | TOPICAL_OINTMENT | Freq: Two times a day (BID) | CUTANEOUS | Status: AC
  Administered 2018-12-19 – 2018-12-23 (×7): 1 via NASAL
  Filled 2018-12-19 (×2): qty 22

## 2018-12-19 MED ORDER — POTASSIUM CHLORIDE CRYS ER 20 MEQ PO TBCR
40.0000 meq | EXTENDED_RELEASE_TABLET | Freq: Once | ORAL | Status: AC
  Administered 2018-12-19: 40 meq via ORAL
  Filled 2018-12-19: qty 2

## 2018-12-19 NOTE — Progress Notes (Signed)
PROGRESS NOTE    [name]  [id number] DOB: [birth year] DOA: 12/18/2018 PCP: [name], MD     Brief Narrative:  85 y.o. male with medical history significant of DM; CAD s/p stents; OSA; morbid obesity (BMI 43); HTN; HLD; and COPD presenting with AMS s/p fall.  Patient is a poor historian and is unaccompanied.  He reports that he came because his son made him.  He fell days to a week ago and has had increasing confusion since.  He has not been taking much PO intake and is unsure why.  He has noticed mild slurring of speech.  BP 87/41 while I was there, MAP 61.  He was last admitted from 7/11-14 for LLL PNA and acute on chronic renal failure with d/c creatinine 2.3.  ED Course:   Weakness, dehydration, markedly elevated BUN (but 100 last admission), CKD, decreased PO intake, not altered.  Head CT ok despite recent fall.  CXR ok and no respiratory symptoms.  Dr. Jonnie Finner suggests observation, IVF, monitoring.  Mildly uremic.   Assessment & Plan: 1-acute on chronic renal failure: stage 3 at baseline -appears to be in the setting of dehydration and hypotension -Continue holding antihypertensive agents -Continue IV fluids -Renal function trending down; follow creatinine trend -Patient overall feeling better reports good urine output.  -BUN also trending down is stabilizing.  2-acute metabolic encephalopathy appears to be associated with ongoing dehydration and uremia -CT head negative -Mentation improved (unknown baseline) -Currently oriented x2 and able to follow commands properly -Continue constant reorientation and minimize the use of medications that can alter mentation.  3-Sleep apnea -Continue the use of CPAP nightly  4-Essential hypertension, benign -Stable currently -Continue holding antihypertensive agents -Continue IV fluids as mentioned above. -Continue heart healthy diet.  5-Obesity, Class III, BMI 40-49.9 (morbid obesity) (HCC) -Body mass index is 41.2  kg/m. -Low calorie diet and portion control discussed with patient.  6-Hyperlipidemia -Patient has reported allergies to the use of Zocor and Lipitor in the past -Continue heart healthy/low-fat diet -Will recommend consideration of a trial of a low-dose statins  7-Type 2 diabetes mellitus with stage 3 chronic kidney disease, with long-term current use of insulin (HCC) -Most recent A1c on July 31 2009.5 -Continue sliding scale insulin and long-acting insulin  8-Chronic combined systolic and diastolic CHF (congestive heart failure) (HCC) -Stable and compensated -Most recent echo demonstrating 40 to 45% ejection fraction -Follow daily weights and strict I's and O's -Continue low-sodium diet -Holding diuretics currently while renal function recovers.  9-chronic respiratory failure with hypoxia in the setting of COPD (chronic obstructive pulmonary disease) (HCC) -No wheezing and no complaints of shortness of breath -Continue oxygen supplementation -Continue PRN nebulizer regimen.   DVT prophylaxis: Lovenox Code Status: DNR Family Communication: No family at bedside Disposition Plan: Remains inpatient; continue IV fluids, transfer to telemetry bed and as per physical therapy evaluation.  Repeat basic metabolic panel in the morning to follow renal function trend.  Consultants:   None  Procedures:   See below for x-ray reports.  Antimicrobials:  Anti-infectives (From admission, onward)   None      Subjective: Awake, oriented x2 orbital; afebrile, denies chest pain or shortness of breath.  Still presently confused intermittently.  Able to follow commands.  No fever  Objective: Vitals:   12/19/18 0500 12/19/18 0600 12/19/18 0700 12/19/18 0726  BP: (!) 128/53 (!) 121/56 120/61   Pulse: (!) 54 (!) 59 (!) 55 70  Resp: 16 17 17  16  Temp:    98.5 F (36.9 C)  TempSrc:    Axillary  SpO2: 98% 94% 94% 95%  Weight: 112.3 kg     Height:        Intake/Output Summary (Last 24  hours) at 12/19/2018 0825 Last data filed at 12/19/2018 0500 Gross per 24 hour  Intake 1242.91 ml  Output 400 ml  Net 842.91 ml   Filed Weights   12/18/18 1705 12/18/18 2301 12/19/18 0500  Weight: 117.9 kg 112.9 kg 112.3 kg    Examination: General exam: Alert, awake, oriented x 2; intermittently confused, following commands appropriately.  No focal neurologic deficits.  Denies chest pain or shortness of breath. Respiratory system: Good air movement bilaterally, no wheezing, no crackles Cardiovascular system:RRR.  No rubs, no gallops, unable to properly assess JVD with body habitus. Gastrointestinal system: Abdomen is obese, nondistended, soft and nontender. No organomegaly or masses felt. Normal bowel sounds heard. Central nervous system: Alert and to follow commands appropriately. No focal neurological deficits. Extremities: No cyanosis or clubbing.  No lower extremity edema appreciated on exam. Psychiatry: Judgement and insight appear impair in the setting of intermittent confusion status.  Normal mood.    Data Reviewed: I have personally reviewed following labs and imaging studies  CBC: Recent Labs  Lab 12/18/18 1716 12/19/18 0429  WBC 7.9 7.1  NEUTROABS 5.8  --   HGB 11.8* 11.5*  HCT 38.7* 37.3*  MCV 93.7 94.2  PLT 134* [id number]*   Basic Metabolic Panel: Recent Labs  Lab 12/18/18 1716 12/19/18 0429  NA 133* 138  K 3.5 3.0*  CL 92* 98  CO2 30 30  GLUCOSE 252* 183*  BUN 153* 137*  CREATININE 3.52* 2.81*  CALCIUM 8.7* 8.9   GFR: Estimated Creatinine Clearance: 23 mL/min (A) (by C-G formula based on SCr of 2.81 mg/dL (H)).   Liver Function Tests: Recent Labs  Lab 12/18/18 1716  AST 12*  ALT 10  ALKPHOS 60  BILITOT 0.3  PROT 6.9  ALBUMIN 3.7   No results for input(s): LIPASE, AMYLASE in the last 168 hours. Recent Labs  Lab 12/18/18 1717  AMMONIA 24   CBG: Recent Labs  Lab 12/18/18 2223 12/18/18 2255 12/19/18 0725  GLUCAP 209* 191* 152*   Urine  analysis:    Component Value Date/Time   COLORURINE STRAW (A) 12/18/2018 1820   APPEARANCEUR CLEAR 12/18/2018 1820   LABSPEC 1.010 12/18/2018 1820   PHURINE 5.0 12/18/2018 1820   GLUCOSEU NEGATIVE 12/18/2018 1820   HGBUR NEGATIVE 12/18/2018 1820   BILIRUBINUR NEGATIVE 12/18/2018 [place] 12/18/2018 1820   PROTEINUR NEGATIVE 12/18/2018 1820   NITRITE NEGATIVE 12/18/2018 1820   LEUKOCYTESUR NEGATIVE 12/18/2018 1820    Recent Results (from the past 240 hour(s))  SARS Coronavirus 2 [facility] order, Performed in [facility] hospital lab) Nasopharyngeal Nasopharyngeal Swab     Status: None   Collection Time: 12/18/18  7:30 PM   Specimen: Nasopharyngeal Swab  Result Value Ref Range Status   SARS Coronavirus 2 NEGATIVE NEGATIVE Final    Comment: (NOTE) If result is NEGATIVE SARS-CoV-2 target nucleic acids are NOT DETECTED. The SARS-CoV-2 RNA is generally detectable in upper and lower  respiratory specimens during the acute phase of infection. The lowest  concentration of SARS-CoV-2 viral copies this assay can detect is 250  copies / mL. A negative result does not preclude SARS-CoV-2 infection  and should not be used as the sole basis for treatment or other  patient management decisions.  A negative result may occur with  improper specimen collection / handling, submission of specimen other  than nasopharyngeal swab, presence of viral mutation(s) within the  areas targeted by this assay, and inadequate number of viral copies  (<250 copies / mL). A negative result must be combined with clinical  observations, patient history, and epidemiological information. If result is POSITIVE SARS-CoV-2 target nucleic acids are DETECTED. The SARS-CoV-2 RNA is generally detectable in upper and lower  respiratory specimens dur ing the acute phase of infection.  Positive  results are indicative of active infection with SARS-CoV-2.  Clinical  correlation with patient history and other  diagnostic information is  necessary to determine patient infection status.  Positive results do  not rule out bacterial infection or co-infection with other viruses. If result is PRESUMPTIVE POSTIVE SARS-CoV-2 nucleic acids MAY BE PRESENT.   A presumptive positive result was obtained on the submitted specimen  and confirmed on repeat testing.  While 2019 novel coronavirus  (SARS-CoV-2) nucleic acids may be present in the submitted sample  additional confirmatory testing may be necessary for epidemiological  and / or clinical management purposes  to differentiate between  SARS-CoV-2 and other Sarbecovirus currently known to infect humans.  If clinically indicated additional testing with an alternate test  methodology [phone]) is advised. The SARS-CoV-2 RNA is generally  detectable in upper and lower respiratory sp ecimens during the acute  phase of infection. The expected result is Negative. Fact Sheet for Patients:  StrictlyIdeas.no Fact Sheet for Healthcare Providers: BankingDealers.co.za This test is not yet approved or cleared by the [place] FDA and has been authorized for detection and/or diagnosis of SARS-CoV-2 by FDA under an Emergency Use Authorization (EUA).  This EUA will remain in effect (meaning this test can be used) for the duration of the COVID-19 declaration under Section 564(b)(1) of the Act, 21 U.S.C. section 360bbb-3(b)(1), unless the authorization is terminated or revoked sooner. Performed at [facility], [address] [place], [place] [address]   MRSA PCR Screening     Status: Abnormal   Collection Time: 12/18/18 10:47 PM   Specimen: Nasal Mucosa; Nasopharyngeal  Result Value Ref Range Status   MRSA by PCR POSITIVE (A) NEGATIVE Final    Comment:        The GeneXpert MRSA Assay (FDA approved for NASAL specimens only), is one component of a comprehensive MRSA colonization surveillance program. It is not  intended to diagnose MRSA infection nor to guide or monitor treatment for MRSA infections. RESULT CALLED TO, READ BACK BY AND VERIFIED WITH: AMBURN,A @ [id number] ON 12/19/18 BY JUW Performed at [facility], [address], [place], [place] [address]      Radiology Studies: Ct Head Wo Contrast  Result Date: 12/18/2018 CLINICAL DATA:  86 year old male with fall. EXAM: CT HEAD WITHOUT CONTRAST CT CERVICAL SPINE WITHOUT CONTRAST TECHNIQUE: Multidetector CT imaging of the head and cervical spine was performed following the standard protocol without intravenous contrast. Multiplanar CT image reconstructions of the cervical spine were also generated. COMPARISON:  Head CT dated 02/08/2017 FINDINGS: Evaluation of this exam is limited due to motion artifact. CT HEAD FINDINGS Brain: There is mild age-related atrophy and chronic microvascular ischemic changes. There is no acute intracranial hemorrhage. No mass effect or midline shift. No extra-axial fluid collection. Vascular: No hyperdense vessel or unexpected calcification. Skull: Normal. Negative for fracture or focal lesion. Sinuses/Orbits: No acute finding. Other: None CT CERVICAL SPINE FINDINGS Alignment: No acute subluxation. Skull base and vertebrae: No  acute fracture. Soft tissues and spinal canal: No prevertebral fluid or swelling. No visible canal hematoma. Disc levels:  Multilevel degenerative changes. Upper chest: Negative. Other: Bilateral carotid bulb calcified plaques. IMPRESSION: 1. No acute intracranial hemorrhage. 2. No acute/traumatic cervical spine pathology. Electronically Signed   By: Anner Crete M.D.   On: 12/18/2018 19:04   Ct Cervical Spine Wo Contrast  Result Date: 12/18/2018 CLINICAL DATA:  85 year old male with fall. EXAM: CT HEAD WITHOUT CONTRAST CT CERVICAL SPINE WITHOUT CONTRAST TECHNIQUE: Multidetector CT imaging of the head and cervical spine was performed following the standard protocol without intravenous contrast. Multiplanar  CT image reconstructions of the cervical spine were also generated. COMPARISON:  Head CT dated 02/08/2017 FINDINGS: Evaluation of this exam is limited due to motion artifact. CT HEAD FINDINGS Brain: There is mild age-related atrophy and chronic microvascular ischemic changes. There is no acute intracranial hemorrhage. No mass effect or midline shift. No extra-axial fluid collection. Vascular: No hyperdense vessel or unexpected calcification. Skull: Normal. Negative for fracture or focal lesion. Sinuses/Orbits: No acute finding. Other: None CT CERVICAL SPINE FINDINGS Alignment: No acute subluxation. Skull base and vertebrae: No acute fracture. Soft tissues and spinal canal: No prevertebral fluid or swelling. No visible canal hematoma. Disc levels:  Multilevel degenerative changes. Upper chest: Negative. Other: Bilateral carotid bulb calcified plaques. IMPRESSION: 1. No acute intracranial hemorrhage. 2. No acute/traumatic cervical spine pathology. Electronically Signed   By: Anner Crete M.D.   On: 12/18/2018 19:04   Dg Chest Portable 1 View  Result Date: 12/18/2018 CLINICAL DATA:  AMS EXAM: PORTABLE CHEST 1 VIEW COMPARISON:  Chest radiographs 12/06/2018, 10/22/2018 FINDINGS: Stable cardiomediastinal contours status post median sternotomy. Cardiomegaly. Aortic arch calcification. There is central vascular congestion. There are right basilar opacities favored to represent atelectasis. There is opacification of the left hemidiaphragm which may represent atelectasis, infiltrate not excluded. No pneumothorax. No acute osseous abnormality in the visualized skeleton. IMPRESSION: 1. Right basilar opacities favored to represent atelectasis. 2. Opacification of the left hemidiaphragm may represent atelectasis, infiltrate not excluded. This could be better assessed with PA and lateral radiographs. 3. Cardiomegaly with central venous congestion. Electronically Signed   By: Audie Pinto M.D.   On: 12/18/2018 18:13    Scheduled Meds: . allopurinol  200 mg Oral Daily  . aspirin EC  81 mg Oral q morning - 10a  . Chlorhexidine Gluconate Cloth  6 each Topical Daily  . citalopram  10 mg Oral Daily  . clopidogrel  75 mg Oral Daily  . enoxaparin (LOVENOX) injection  30 mg Subcutaneous Q24H  . insulin aspart  0-15 Units Subcutaneous TID WC  . insulin aspart  0-5 Units Subcutaneous QHS  . insulin glargine  60 Units Subcutaneous QHS  . isosorbide mononitrate  30 mg Oral BID  . mouth rinse  15 mL Mouth Rinse BID  . metoprolol tartrate  50 mg Oral BID  . mupirocin ointment  1 application Nasal BID  . potassium chloride  40 mEq Oral Once  . psyllium  1 packet Oral Daily  . sodium chloride flush  3 mL Intravenous Q12H  . tamsulosin  0.4 mg Oral QPM   Continuous Infusions: . lactated ringers 100 mL/hr at 12/18/18 2258     LOS: 0 days    Time spent: 30 minutes.   Barton Dubois, MD [facility] Pager [phone]   12/19/2018, 8:25 AM

## 2018-12-19 NOTE — Evaluation (Signed)
Physical Therapy Evaluation Patient Details Name: [name] MRN: [id number] DOB: 12-09-1933 Today's Date: 12/19/2018   History of Present Illness  [name] is a 85 y.o. male with medical history significant of DM; CAD s/p stents; OSA; morbid obesity (BMI 43); HTN; HLD; and COPD presenting with AMS s/p fall.  Patient is a poor historian and is unaccompanied.  He reports that he came because his son made him.  He fell days to a week ago and has had increasing confusion since.  He has not been taking much PO intake and is unsure why.  He has noticed mild slurring of speech.    Clinical Impression  Pt admitted with above diagnosis. Patient alert and oriented x3 today and agreeable to evaluation. Patient requested assistance to scoot up in bed stating his back hurt. PT suggested to come to sit up at EOB, stand and side-step laterally at bedside. Patient refused stating he had diarrhea all night last night and was too tired. Patient did assist PT in scooting up in bed but required max assistance at this time. Additional assessment for bed mobility, transfers and ambulation will be completed on subsequent PT sessions. Pt currently with functional limitations due to the deficits listed below (see PT Problem List). Pt will benefit from skilled PT to increase their independence and safety with mobility to allow discharge to the venue listed below.       Follow Up Recommendations SNF;Supervision/Assistance - 24 hour    Equipment Recommendations  None recommended by PT    Recommendations for Other Services       Precautions / Restrictions Precautions Precautions: Fall Restrictions Weight Bearing Restrictions: No      Mobility  Bed Mobility Overal bed mobility: Needs Assistance Bed Mobility: Rolling Rolling: Max assist         General bed mobility comments: patient weak, unable to assist with arms, did push up with legs in hook lying  Transfers                 General  transfer comment: patient refused  Ambulation/Gait             General Gait Details: patient refused  Stairs            Wheelchair Mobility    Modified Rankin (Stroke Patients Only)       Balance                                             Pertinent Vitals/Pain Pain Assessment: 0-10 Pain Score: 7  Pain Location: stomach re: digestion Pain Intervention(s): Limited activity within patient's tolerance;Monitored during session;Premedicated before session    Home Living Family/patient expects to be discharged to:: Private residence Living Arrangements: Spouse/significant other Available Help at Discharge: Family;Available 24 hours/day Type of Home: House Home Access: Level entry     Home Layout: One level Home Equipment: Walker - 2 wheels;Shower seat;Crutches      Prior Function Level of Independence: Independent with assistive device(s);Needs assistance   Gait / Transfers Assistance Needed: ambalated with RW household distances  ADL's / Homemaking Assistance Needed: Assistance for dressing, bathing; assistance for cooking, cleaning, laundry        Hand Dominance   Dominant Hand: Left    Extremity/Trunk Assessment   Upper Extremity Assessment Upper Extremity Assessment: Generalized weakness    Lower Extremity Assessment Lower  Extremity Assessment: Generalized weakness       Communication   Communication: No difficulties  Cognition Arousal/Alertness: Awake/alert Behavior During Therapy: WFL for tasks assessed/performed Overall Cognitive Status: Within Functional Limits for tasks assessed                                 General Comments: Poor historian noted on intake paperwork; oriented to place, person, and time today.      General Comments      Exercises     Assessment/Plan    PT Assessment Patient needs continued PT services  PT Problem List Decreased strength;Decreased mobility;Decreased activity  tolerance;Pain       PT Treatment Interventions DME instruction;Therapeutic activities;Patient/family education;Therapeutic exercise;Gait training;Balance training;Neuromuscular re-education    PT Goals (Current goals can be found in the Care Plan section)  Acute Rehab PT Goals Patient Stated Goal: Feel better. PT Goal Formulation: With patient Time For Goal Achievement: 01/02/19 Potential to Achieve Goals: Fair    Frequency Min 3X/week   Barriers to discharge        Co-evaluation               AM-PAC PT "6 Clicks" Mobility  Outcome Measure Help needed turning from your back to your side while in a flat bed without using bedrails?: Total Help needed moving from lying on your back to sitting on the side of a flat bed without using bedrails?: Total Help needed moving to and from a bed to a chair (including a wheelchair)?: Total Help needed standing up from a chair using your arms (e.g., wheelchair or bedside chair)?: Total Help needed to walk in hospital room?: Total Help needed climbing 3-5 steps with a railing? : Total 6 Click Score: 6    End of Session   Activity Tolerance: Patient limited by pain;Patient limited by fatigue Patient left: in bed;with call bell/phone within reach;with bed alarm set Nurse Communication: Mobility status PT Visit Diagnosis: Difficulty in walking, not elsewhere classified (R26.2);Muscle weakness (generalized) (M62.81)    Time: [id number] PT Time Calculation (min) (ACUTE ONLY): 30 min   Charges:   PT Evaluation $PT Eval Low Complexity: 1 Low PT Treatments $Therapeutic Activity: 8-22 mins        Floria Raveling. Hartnett-Rands, MS, PT Per [place] [phone] 12/19/2018, 2:12 PM

## 2018-12-19 NOTE — Plan of Care (Signed)
  Problem: Acute Rehab PT Goals(only PT should resolve) Goal: Pt will Roll Supine to Side Outcome: Progressing Flowsheets (Taken 12/19/2018 1417) Pt will Roll Supine to Side: with mod assist Goal: Pt Will Go Supine/Side To Sit Outcome: Progressing Flowsheets (Taken 12/19/2018 1417) Pt will go Supine/Side to Sit: with moderate assist Goal: Pt Will Go Sit To Supine/Side Outcome: Progressing Flowsheets (Taken 12/19/2018 1417) Pt will go Sit to Supine/Side: with moderate assist Goal: Patient Will Transfer Sit To/From Stand Outcome: Progressing Flowsheets (Taken 12/19/2018 1417) Patient will transfer sit to/from stand: with moderate assist Goal: Pt Will Transfer Bed To Chair/Chair To Bed Outcome: Progressing Flowsheets (Taken 12/19/2018 1417) Pt will Transfer Bed to Chair/Chair to Bed: with mod assist Goal: Pt Will Ambulate Outcome: Progressing Flowsheets (Taken 12/19/2018 1417) Pt will Ambulate:  10 feet  with moderate assist  with least restrictive assistive device   Pamala Hurry D. Hartnett-Rands, MS, PT Per [place] [phone] 12/19/2018

## 2018-12-19 NOTE — Care Management Obs Status (Signed)
[place] NOTIFICATION   Patient Details  Name: [name] MRN: [id number] Date of Birth: 06-05-1933   Medicare Observation Status Notification Given:  Yes    Tommy Medal 12/19/2018, 12:30 PM

## 2018-12-19 NOTE — Progress Notes (Signed)
Patient to be transferred to a telemetry bed and in stable condition. Patient made aware of transfer and agreeable. Son previously made aware of pending transfer also. Patient to be transported to 331 by staff via bed. Patient report given to accepting RN.  Celestia Khat, RN

## 2018-12-20 LAB — GLUCOSE, CAPILLARY
Glucose-Capillary: 216 mg/dL — ABNORMAL HIGH (ref 70–99)
Glucose-Capillary: 304 mg/dL — ABNORMAL HIGH (ref 70–99)
Glucose-Capillary: 209 mg/dL — ABNORMAL HIGH (ref 70–99)
Glucose-Capillary: 176 mg/dL — ABNORMAL HIGH (ref 70–99)

## 2018-12-20 LAB — BASIC METABOLIC PANEL
Anion gap: 9 (ref 5–15)
BUN: 110 mg/dL — ABNORMAL HIGH (ref 8–23)
CO2: 29 mmol/L (ref 22–32)
Calcium: 9.1 mg/dL (ref 8.9–10.3)
Chloride: 99 mmol/L (ref 98–111)
Creatinine, Ser: 2.31 mg/dL — ABNORMAL HIGH (ref 0.61–1.24)
GFR calc Af Amer: 29 mL/min — ABNORMAL LOW (ref >60)
GFR calc non Af Amer: 25 mL/min — ABNORMAL LOW (ref >60)
Glucose, Bld: 211 mg/dL — ABNORMAL HIGH (ref 70–99)
Potassium: 3.6 mmol/L (ref 3.5–5.1)
Sodium: 137 mmol/L (ref 135–145)

## 2018-12-20 LAB — MAGNESIUM: Magnesium: 1.8 mg/dL (ref 1.7–2.4)

## 2018-12-20 MED ORDER — TORSEMIDE 20 MG PO TABS
20.0000 mg | ORAL_TABLET | Freq: Every day | ORAL | Status: DC
  Administered 2018-12-21 – 2018-12-24 (×4): 20 mg via ORAL
  Filled 2018-12-20 (×4): qty 1

## 2018-12-20 NOTE — TOC Initial Note (Signed)
Transition of Care [facility]) - Initial/Assessment Note    Patient Details  Name: [name] MRN: [id number] Date of Birth: October 23, 1933  Transition [facility]) CM/SW Contact:    Boneta Lucks, RN Phone Number: 12/20/2018, 11:22 AM  Clinical Narrative:    Patient was admitted for dehydration. Patient has a high score of readmission. Patient lives at home with his wife. Uses a walker.  PT recommended SNF or 24 hour care with supervision.  Wife states he did well with HHPT in the past, but would not do the exercises after discharged.  Patient has also been at [facility]. They was very pleased, he was must stronger and did well at home after that stay. Patient, wife and Son agrees he need to go to SNF. CHoices given. FL2 done a sent out. INS auth started with [employer].             Expected Discharge Plan: Skilled Nursing Facility Barriers to Discharge: No Barriers Identified   Patient Goals and CMS Choice Patient states their goals for this hospitalization and ongoing recovery are:: to go to SNF then return home. CMS Medicare.gov Compare Post Acute Care list provided to:: Patient Represenative (must comment)(Wife/Son) Choice offered to / list presented to : Spouse, Adult Children  Expected Discharge Plan and Services Expected Discharge Plan: [place] Acute Care Choice: [place] Living arrangements for the past 2 months: Single Family Home                       Prior Living Arrangements/Services Living arrangements for the past 2 months: Single Family Home Lives with:: Spouse Patient language and need for interpreter reviewed:: Yes        Need for Family Participation in Patient Care: Yes (Comment) Care giver support system in place?: Yes (comment) Current home services: DME Criminal Activity/Legal Involvement Pertinent to Current Situation/Hospitalization: No - Comment as needed  Activities of Daily Living Home  Assistive Devices/Equipment: Walker (specify type) ADL Screening (condition at time of admission) Patient's cognitive ability adequate to safely complete daily activities?: Yes Is the patient deaf or have difficulty hearing?: Yes Does the patient have difficulty seeing, even when wearing glasses/contacts?: No Does the patient have difficulty concentrating, remembering, or making decisions?: No Patient able to express need for assistance with ADLs?: Yes Does the patient have difficulty dressing or bathing?: Yes Independently performs ADLs?: No Communication: Independent Dressing (OT): Needs assistance Is this a change from baseline?: Pre-admission baseline Grooming: Needs assistance Is this a change from baseline?: Pre-admission baseline Feeding: Needs assistance, Independent Is this a change from baseline?: Pre-admission baseline Bathing: Needs assistance Is this a change from baseline?: Pre-admission baseline Toileting: Needs assistance Is this a change from baseline?: Pre-admission baseline In/Out Bed: Needs assistance Is this a change from baseline?: Pre-admission baseline Walks in Home: Needs assistance Is this a change from baseline?: Pre-admission baseline Does the patient have difficulty walking or climbing stairs?: Yes Weakness of Legs: Both Weakness of Arms/Hands: None  Permission Sought/Granted   Permission granted to share information with : Yes, Verbal Permission Granted        Permission granted to share info w Relationship: Wife  Permission granted to share info w Contact Information: SNF  Emotional Assessment     Affect (typically observed): Accepting Orientation: : Oriented to Self, Oriented to Place Alcohol / Substance Use: Not Applicable Psych Involvement: No (comment)  Admission diagnosis:  Uremia [N19]  Acute kidney injury superimposed on chronic kidney disease ([place]) [N17.9, N18.9] Dehydration [E86.0] Patient Active Problem List   Diagnosis Date  Noted  . Dehydration 12/18/2018  . Acute metabolic encephalopathy 123456  . Acute renal failure superimposed on stage 3 chronic kidney disease ([place]) 12/18/2018  . AKI (acute kidney injury) ([place])   . Bilateral lower extremity edema   . HCAP (healthcare-associated pneumonia) 10/22/2018  . Pressure injury of skin 10/22/2018  . E. coli UTI 08/04/2018  . CKD stage 3 due to type 2 diabetes mellitus ([place]) 07/27/2018  . Chronic constipation 07/27/2018  . COPD (chronic obstructive pulmonary disease) ([place]) 07/27/2018  . BPH without urinary obstruction 07/27/2018  . Chronic gout due to renal impairment without tophus 07/27/2018  . Depression with anxiety 07/27/2018  . Mild aortic stenosis 07/27/2018  . Unstable angina ([place]) 07/22/2018  . Demand ischemia ([place])   . Secondary cardiomyopathy ([place])   . Chest pain 07/21/2018  . Elevated troponin 07/21/2018  . Acute on chronic diastolic heart failure ([place])   . CKD stage 3 secondary to diabetes ([place]) 01/01/2014  . Chronic combined systolic and diastolic CHF (congestive heart failure) ([place]) 08/11/2012  . CAD (coronary artery disease) of artery bypass graft 08/08/2012  . Sleep apnea   . Essential hypertension, benign   . Obesity, Class III, BMI 40-49.9 (morbid obesity) ([place])   . Hyperlipidemia   . Type 2 diabetes mellitus with stage 3 chronic kidney disease, with long-term current use of insulin ([place])   . Coronary artery disease involving native coronary artery of native heart with unstable angina pectoris ([place])    PCP:  [name], MD Pharmacy:   [facility] [phone] - [place], [place] [address] [place] [address] Phone: [phone] Fax: [phone]  [facility] [phone] - [place], [place] [address] [place] [address] [place] [address] Phone: [phone] Fax: [phone]  [facility] - [address] [place], [place] [address] Phone: [phone] Fax: [phone]     Social Determinants of Health (SDOH) Interventions    Readmission Risk Interventions Readmission Risk Prevention Plan 12/20/2018 10/25/2018 10/24/2018  Transportation Screening Complete Complete Complete  PCP or Specialist Appt within 3-5 Days - Not Complete -  HRI or [place] - Complete Complete  Social Work Consult for [place] Planning/Counseling - Complete Complete  Palliative Care Screening - Not Applicable Not Applicable  Medication Review Press photographer) Complete Complete Complete  PCP or Specialist appointment within 3-5 days of discharge Not Complete - -  HRI or Home Care Consult Complete - -  SW Recovery Care/Counseling Consult Complete - -  Palliative Care Screening Not Complete - -  Skilled Nursing Facility Complete - -  Some recent data might be hidden

## 2018-12-20 NOTE — Progress Notes (Signed)
PROGRESS NOTE    [name]  [id number] DOB: 18-Sep-1933 DOA: 12/18/2018 PCP: [name], MD     Brief Narrative:  85 y.o. male with medical history significant of DM; CAD s/p stents; OSA; morbid obesity (BMI 43); HTN; HLD; and COPD presenting with AMS s/p fall.  Patient is a poor historian and is unaccompanied.  He reports that he came because his son made him.  He fell days to a week ago and has had increasing confusion since.  He has not been taking much PO intake and is unsure why.  He has noticed mild slurring of speech.  BP 87/41 while I was there, MAP 61.  He was last admitted from 7/11-14 for LLL PNA and acute on chronic renal failure with d/c creatinine 2.3.  ED Course:   Weakness, dehydration, markedly elevated BUN (but 100 last admission), CKD, decreased PO intake, not altered.  Head CT ok despite recent fall.  CXR ok and no respiratory symptoms.  Dr. Jonnie Finner suggests observation, IVF, monitoring.  Mildly uremic.   Assessment & Plan: 1-acute on chronic renal failure: stage 3 at baseline -appears to be in the setting of dehydration and hypotension -BP stable and rising now, will resume antihypertensive agents.  -maintain adequate oral hydration. -Renal function back to baseline now. -Patient overall feeling better and reports good urine output.  -follow BMET in am  2-acute metabolic encephalopathy appears to be associated with ongoing dehydration and uremia -CT head negative -Mentation improved (unknown baseline) -Currently oriented x2 and able to follow commands properly -Continue constant reorientation and minimize the use of medications that can alter mentation. -due to component of physical deconditioning will benefit of SNF for rehab.  3-Sleep apnea -Continue the use of CPAP nightly  4-Essential hypertension, benign -Stable currently -Continue holding antihypertensive agents -Continue IV fluids as mentioned above. -Continue heart healthy  diet.  5-Obesity, Class III, BMI 40-49.9 (morbid obesity) (HCC) -Body mass index is 41.2 kg/m. -Low calorie diet and portion control discussed with patient.  6-Hyperlipidemia -Patient has reported allergies to the use of Zocor and Lipitor in the past -Continue heart healthy/low-fat diet -Will recommend consideration of a trial of a low-dose statins  7-Type 2 diabetes mellitus with stage 3 chronic kidney disease, with long-term current use of insulin (HCC) -Most recent A1c on April 2020 11.5 -Continue sliding scale insulin and long-acting insulin -modified carb diet ordered.  8-Chronic combined systolic and diastolic CHF (congestive heart failure) (HCC) -Stable and compensated -Most recent echo demonstrating 40 to 45% ejection fraction -Follow daily weights and strict I's and O's -Continue low-sodium diet -resume demadex on 12/21/2018  9-chronic respiratory failure with hypoxia in the setting of COPD (chronic obstructive pulmonary disease) (HCC) -No wheezing and no complaints of shortness of breath -Continue oxygen supplementation -Continue PRN nebulizer regimen.   DVT prophylaxis: Lovenox Code Status: DNR Family Communication: No family at bedside Disposition Plan: Remains inpatient while finding nursing facility to assist with conditioning and rehabilitation.  Stop further IV fluid resuscitation, maintain adequate oral intake and hydration.  Resume Demadex in a.m.  Repeat basic metabolic panel to follow electrolytes and renal function trend.   Consultants:   None  Procedures:   See below for x-ray reports.  Antimicrobials:  Anti-infectives (From admission, onward)   None      Subjective: Afebrile, in no major distress; denies chest pain, no shortness of breath, no orthopnea.  Patient is feeling weak and tired.  Pleasantly intermittently confused, but overall able to follow  commands appropriately and most likely with mentation back to baseline  now.  Objective: Vitals:   12/19/18 2132 12/19/18 2138 12/19/18 2138 12/20/18 0528  BP:  (!) 162/61 (!) 162/61 (!) 129/46  Pulse:  74 73 70  Resp:  16 16 16   Temp:  98.7 F (37.1 C) 98.7 F (37.1 C) 98.7 F (37.1 C)  TempSrc:  Oral Oral Oral  SpO2: 94% 97% 97% 90%  Weight:      Height:        Intake/Output Summary (Last 24 hours) at 12/20/2018 1317 Last data filed at 12/20/2018 0906 Gross per 24 hour  Intake 1474.01 ml  Output 1550 ml  Net -75.99 ml   Filed Weights   12/18/18 1705 12/18/18 2301 12/19/18 0500  Weight: 117.9 kg 112.9 kg 112.3 kg    Examination: General exam: Alert, awake, oriented x 2, has remained mildly intermittently confused and pleasant.  Able to follow commands appropriately.  No headaches, no blurred vision or focal neurologic deficits on exam.  No chest pain, no shortness of breath, no orthopnea.  Patient reports feeling weak and tired. Respiratory system: Clear to auscultation. Respiratory effort normal. Cardiovascular system:RRR. No murmurs, rubs, gallops. Gastrointestinal system: Abdomen is obese, nondistended, soft and nontender. No organomegaly or masses felt. Normal bowel sounds heard. Central nervous system: Alert and oriented. No focal neurological deficits. Extremities: No C/C/E, +pedal pulses Skin: No rashes, lesions or ulcers Psychiatry: Judgement and insight appear normal and most likely at baseline currently. Mood & affect appropriate.    Data Reviewed: I have personally reviewed following labs and imaging studies  CBC: Recent Labs  Lab 12/18/18 1716 12/19/18 0429  WBC 7.9 7.1  NEUTROABS 5.8  --   HGB 11.8* 11.5*  HCT 38.7* 37.3*  MCV 93.7 94.2  PLT 134* [id number]*   Basic Metabolic Panel: Recent Labs  Lab 12/18/18 1716 12/19/18 0429 12/20/18 0515  NA 133* 138 137  K 3.5 3.0* 3.6  CL 92* 98 99  CO2 30 30 29   GLUCOSE 252* 183* 211*  BUN 153* 137* 110*  CREATININE 3.52* 2.81* 2.31*  CALCIUM 8.7* 8.9 9.1  MG  --   --  1.8    GFR: Estimated Creatinine Clearance: 28 mL/min (A) (by C-G formula based on SCr of 2.31 mg/dL (H)).   Liver Function Tests: Recent Labs  Lab 12/18/18 1716  AST 12*  ALT 10  ALKPHOS 60  BILITOT 0.3  PROT 6.9  ALBUMIN 3.7    Recent Labs  Lab 12/18/18 1717  AMMONIA 24   CBG: Recent Labs  Lab 12/19/18 0725 12/19/18 1119 12/19/18 1615 12/19/18 2232 12/20/18 0750  GLUCAP 152* 178* 190* 193* 216*   Urine analysis:    Component Value Date/Time   COLORURINE STRAW (A) 12/18/2018 1820   APPEARANCEUR CLEAR 12/18/2018 1820   LABSPEC 1.010 12/18/2018 1820   PHURINE 5.0 12/18/2018 1820   GLUCOSEU NEGATIVE 12/18/2018 1820   HGBUR NEGATIVE 12/18/2018 1820   BILIRUBINUR NEGATIVE 12/18/2018 [place] 12/18/2018 1820   PROTEINUR NEGATIVE 12/18/2018 1820   NITRITE NEGATIVE 12/18/2018 1820   LEUKOCYTESUR NEGATIVE 12/18/2018 1820    Recent Results (from the past 240 hour(s))  Urine culture     Status: None   Collection Time: 12/18/18  6:20 PM   Specimen: Urine, Clean Catch  Result Value Ref Range Status   Specimen Description   Final    URINE, CLEAN CATCH Performed at [facility], [address], [place], [place] [address]  Special Requests   Final    NONE Performed at [facility], [address] [place], [place] [address]    Culture   Final    NO GROWTH Performed at [facility], [place] [address] [place], [place] [address]    Report Status 12/19/2018 FINAL  Final  SARS Coronavirus 2 [facility] order, Performed in [facility] hospital lab) Nasopharyngeal Nasopharyngeal Swab     Status: None   Collection Time: 12/18/18  7:30 PM   Specimen: Nasopharyngeal Swab  Result Value Ref Range Status   SARS Coronavirus 2 NEGATIVE NEGATIVE Final    Comment: (NOTE) If result is NEGATIVE SARS-CoV-2 target nucleic acids are NOT DETECTED. The SARS-CoV-2 RNA is generally detectable in upper and lower  respiratory specimens during the acute phase of  infection. The lowest  concentration of SARS-CoV-2 viral copies this assay can detect is 250  copies / mL. A negative result does not preclude SARS-CoV-2 infection  and should not be used as the sole basis for treatment or other  patient management decisions.  A negative result may occur with  improper specimen collection / handling, submission of specimen other  than nasopharyngeal swab, presence of viral mutation(s) within the  areas targeted by this assay, and inadequate number of viral copies  (<250 copies / mL). A negative result must be combined with clinical  observations, patient history, and epidemiological information. If result is POSITIVE SARS-CoV-2 target nucleic acids are DETECTED. The SARS-CoV-2 RNA is generally detectable in upper and lower  respiratory specimens dur ing the acute phase of infection.  Positive  results are indicative of active infection with SARS-CoV-2.  Clinical  correlation with patient history and other diagnostic information is  necessary to determine patient infection status.  Positive results do  not rule out bacterial infection or co-infection with other viruses. If result is PRESUMPTIVE POSTIVE SARS-CoV-2 nucleic acids MAY BE PRESENT.   A presumptive positive result was obtained on the submitted specimen  and confirmed on repeat testing.  While 2019 novel coronavirus  (SARS-CoV-2) nucleic acids may be present in the submitted sample  additional confirmatory testing may be necessary for epidemiological  and / or clinical management purposes  to differentiate between  SARS-CoV-2 and other Sarbecovirus currently known to infect humans.  If clinically indicated additional testing with an alternate test  methodology [phone]) is advised. The SARS-CoV-2 RNA is generally  detectable in upper and lower respiratory sp ecimens during the acute  phase of infection. The expected result is Negative. Fact Sheet for Patients:   StrictlyIdeas.no Fact Sheet for Healthcare Providers: BankingDealers.co.za This test is not yet approved or cleared by the [place] FDA and has been authorized for detection and/or diagnosis of SARS-CoV-2 by FDA under an Emergency Use Authorization (EUA).  This EUA will remain in effect (meaning this test can be used) for the duration of the COVID-19 declaration under Section 564(b)(1) of the Act, 21 U.S.C. section 360bbb-3(b)(1), unless the authorization is terminated or revoked sooner. Performed at [facility], [address] [place],  [address]   MRSA PCR Screening     Status: Abnormal   Collection Time: 12/18/18 10:47 PM   Specimen: Nasal Mucosa; Nasopharyngeal  Result Value Ref Range Status   MRSA by PCR POSITIVE (A) NEGATIVE Final    Comment:        The GeneXpert MRSA Assay (FDA approved for NASAL specimens only), is one component of a comprehensive MRSA colonization surveillance program. It is not intended to  diagnose MRSA infection nor to guide or monitor treatment for MRSA infections. RESULT CALLED TO, READ BACK BY AND VERIFIED WITH: AMBURN,A @ [id number] ON 12/19/18 BY JUW Performed at [facility], [address], [place], [place] [address]      Radiology Studies: Ct Head Wo Contrast  Result Date: 12/18/2018 CLINICAL DATA:  85 year old male with fall. EXAM: CT HEAD WITHOUT CONTRAST CT CERVICAL SPINE WITHOUT CONTRAST TECHNIQUE: Multidetector CT imaging of the head and cervical spine was performed following the standard protocol without intravenous contrast. Multiplanar CT image reconstructions of the cervical spine were also generated. COMPARISON:  Head CT dated 02/08/2017 FINDINGS: Evaluation of this exam is limited due to motion artifact. CT HEAD FINDINGS Brain: There is mild age-related atrophy and chronic microvascular ischemic changes. There is no acute intracranial hemorrhage. No mass effect or midline shift. No  extra-axial fluid collection. Vascular: No hyperdense vessel or unexpected calcification. Skull: Normal. Negative for fracture or focal lesion. Sinuses/Orbits: No acute finding. Other: None CT CERVICAL SPINE FINDINGS Alignment: No acute subluxation. Skull base and vertebrae: No acute fracture. Soft tissues and spinal canal: No prevertebral fluid or swelling. No visible canal hematoma. Disc levels:  Multilevel degenerative changes. Upper chest: Negative. Other: Bilateral carotid bulb calcified plaques. IMPRESSION: 1. No acute intracranial hemorrhage. 2. No acute/traumatic cervical spine pathology. Electronically Signed   By: Anner Crete M.D.   On: 12/18/2018 19:04   Ct Cervical Spine Wo Contrast  Result Date: 12/18/2018 CLINICAL DATA:  85 year old male with fall. EXAM: CT HEAD WITHOUT CONTRAST CT CERVICAL SPINE WITHOUT CONTRAST TECHNIQUE: Multidetector CT imaging of the head and cervical spine was performed following the standard protocol without intravenous contrast. Multiplanar CT image reconstructions of the cervical spine were also generated. COMPARISON:  Head CT dated 02/08/2017 FINDINGS: Evaluation of this exam is limited due to motion artifact. CT HEAD FINDINGS Brain: There is mild age-related atrophy and chronic microvascular ischemic changes. There is no acute intracranial hemorrhage. No mass effect or midline shift. No extra-axial fluid collection. Vascular: No hyperdense vessel or unexpected calcification. Skull: Normal. Negative for fracture or focal lesion. Sinuses/Orbits: No acute finding. Other: None CT CERVICAL SPINE FINDINGS Alignment: No acute subluxation. Skull base and vertebrae: No acute fracture. Soft tissues and spinal canal: No prevertebral fluid or swelling. No visible canal hematoma. Disc levels:  Multilevel degenerative changes. Upper chest: Negative. Other: Bilateral carotid bulb calcified plaques. IMPRESSION: 1. No acute intracranial hemorrhage. 2. No acute/traumatic cervical  spine pathology. Electronically Signed   By: Anner Crete M.D.   On: 12/18/2018 19:04   Dg Chest Portable 1 View  Result Date: 12/18/2018 CLINICAL DATA:  AMS EXAM: PORTABLE CHEST 1 VIEW COMPARISON:  Chest radiographs 12/06/2018, 10/22/2018 FINDINGS: Stable cardiomediastinal contours status post median sternotomy. Cardiomegaly. Aortic arch calcification. There is central vascular congestion. There are right basilar opacities favored to represent atelectasis. There is opacification of the left hemidiaphragm which may represent atelectasis, infiltrate not excluded. No pneumothorax. No acute osseous abnormality in the visualized skeleton. IMPRESSION: 1. Right basilar opacities favored to represent atelectasis. 2. Opacification of the left hemidiaphragm may represent atelectasis, infiltrate not excluded. This could be better assessed with PA and lateral radiographs. 3. Cardiomegaly with central venous congestion. Electronically Signed   By: Audie Pinto M.D.   On: 12/18/2018 18:13   Scheduled Meds:  allopurinol  200 mg Oral Daily   aspirin EC  81 mg Oral q morning - 10a   Chlorhexidine Gluconate Cloth  6 each Topical Daily   citalopram  10 mg Oral Daily   clopidogrel  75 mg Oral Daily   enoxaparin (LOVENOX) injection  30 mg Subcutaneous Q24H   insulin aspart  0-15 Units Subcutaneous TID WC   insulin aspart  0-5 Units Subcutaneous QHS   insulin glargine  60 Units Subcutaneous QHS   isosorbide mononitrate  30 mg Oral BID   mouth rinse  15 mL Mouth Rinse BID   metoprolol tartrate  50 mg Oral BID   mupirocin ointment  1 application Nasal BID   psyllium  1 packet Oral Daily   sodium chloride flush  3 mL Intravenous Q12H   tamsulosin  0.4 mg Oral QPM   [START ON 12/21/2018] torsemide  20 mg Oral Daily   Continuous Infusions:  lactated ringers 75 mL/hr at 12/19/18 0950     LOS: 1 day    Time spent: 30 minutes.   Barton Dubois, MD [facility] Pager  [phone]   12/20/2018, 1:17 PM

## 2018-12-20 NOTE — Progress Notes (Signed)
Physical Therapy Treatment Patient Details Name: BARAN KLEMZ MRN: [id number] DOB: Dec 30, 1933 Today's Date: 12/20/2018    History of Present Illness KYU CARUTH is a 85 y.o. male with medical history significant of DM; CAD s/p stents; OSA; morbid obesity (BMI 29); HTN; HLD; and COPD presenting with AMS s/p fall.  Patient is a poor historian and is unaccompanied.  He reports that he came because his son made him.  He fell days to a week ago and has had increasing confusion since.  He has not been taking much PO intake and is unsure why.  He has noticed mild slurring of speech.    PT Comments    Patient agreeable for therapy and demonstrates increased endurance/distance for ambulation and OOB activities.  Patient demonstrates fair/good return for completing BLE/ROM strengthening exercises while seated at bedside, slow slightly labored cadence without loss of balance on 2 LPM O2 with SpO2 at 96% during ambulation, limited secondary to c/o fatigue and tolerated sitting up in chair after therapy with his son present at bedside.  .Patient will benefit from continued physical therapy in hospital and recommended venue below to increase strength, balance, endurance for safe ADLs and gait.   Follow Up Recommendations  SNF;Supervision for mobility/OOB;Supervision - Intermittent     Equipment Recommendations  None recommended by PT    Recommendations for Other Services       Precautions / Restrictions Precautions Precautions: Fall Restrictions Weight Bearing Restrictions: No    Mobility  Bed Mobility Overal bed mobility: Needs Assistance Bed Mobility: Supine to Sit     Supine to sit: Min guard;Min assist     General bed mobility comments: required head of bed raised and slow labored movement  Transfers Overall transfer level: Needs assistance Equipment used: Rolling walker (2 wheeled) Transfers: Sit to/from [facility] Sit to Stand: Min guard;Min assist Stand  pivot transfers: Min guard;Min assist       General transfer comment: increased time, labored movement  Ambulation/Gait Ambulation/Gait assistance: Min assist Gait Distance (Feet): 50 Feet Assistive device: Rolling walker (2 wheeled) Gait Pattern/deviations: Decreased step length - right;Decreased step length - left;Decreased stride length Gait velocity: decreased   General Gait Details: slow slightly labored cadence without loss of balance, limited secondary to c/o fatigue   Stairs             Wheelchair Mobility    Modified Rankin (Stroke Patients Only)       Balance Overall balance assessment: Needs assistance Sitting-balance support: Feet supported;No upper extremity supported Sitting balance-Leahy Scale: Fair Sitting balance - Comments: fair/good seated at bedside   Standing balance support: Bilateral upper extremity supported;During functional activity Standing balance-Leahy Scale: Fair Standing balance comment: using RW                            Cognition Arousal/Alertness: Awake/alert Behavior During Therapy: WFL for tasks assessed/performed Overall Cognitive Status: Within Functional Limits for tasks assessed                                        Exercises General Exercises - Lower Extremity Long Arc Quad: Seated;AROM;Strengthening;Both;10 reps Hip Flexion/Marching: Seated;AROM;Strengthening;10 reps;Both Toe Raises: Seated;AROM;Strengthening;10 reps;Both Heel Raises: Seated;AROM;Strengthening;Both;10 reps    General Comments        Pertinent Vitals/Pain Pain Assessment: No/denies pain    Home Living  Prior Function            PT Goals (current goals can now be found in the care plan section) Acute Rehab PT Goals Patient Stated Goal: Feel better. PT Goal Formulation: With patient/family Time For Goal Achievement: 01/02/19 Potential to Achieve Goals: Good Progress towards PT  goals: Progressing toward goals    Frequency    Min 3X/week      PT Plan Current plan remains appropriate    Co-evaluation              AM-PAC PT "6 Clicks" Mobility   Outcome Measure  Help needed turning from your back to your side while in a flat bed without using bedrails?: A Little Help needed moving from lying on your back to sitting on the side of a flat bed without using bedrails?: A Little Help needed moving to and from a bed to a chair (including a wheelchair)?: A Little Help needed standing up from a chair using your arms (e.g., wheelchair or bedside chair)?: A Little Help needed to walk in hospital room?: A Little Help needed climbing 3-5 steps with a railing? : A Lot 6 Click Score: 17    End of Session Equipment Utilized During Treatment: Oxygen Activity Tolerance: Patient tolerated treatment well;Patient limited by fatigue Patient left: in chair;with call bell/phone within reach;with family/visitor present Nurse Communication: Mobility status PT Visit Diagnosis: Unsteadiness on feet (R26.81);Other abnormalities of gait and mobility (R26.89);Muscle weakness (generalized) (M62.81)     Time: [id number] PT Time Calculation (min) (ACUTE ONLY): 31 min  Charges:  $Gait Training: 8-22 mins $Therapeutic Exercise: 8-22 mins                     4:09 PM, 12/20/18 Lonell Grandchild, MPT Physical Therapist with [facility] 336 [phone] office [phone] mobile phone

## 2018-12-20 NOTE — NC FL2 (Signed)
[place] MEDICAID FL2 LEVEL OF CARE SCREENING TOOL     IDENTIFICATION  Patient Name: [name] Birthdate: Jul 04, 1933 Sex: male Admission Date (Current Location): 12/18/2018  [facility] and [place] Number:  [employer] and Address:  [place] [address] [place]      Provider Number: [id number]  Attending Physician Name and Address:  Barton Dubois, MD  Relative Name and Phone Number:  Brain Swart - Son - POA  Thereasa Parkin - wife [phone]    Current Level of Care: Hospital Recommended Level of Care: [place] Prior Approval Number:    Date Approved/Denied:   PASRR Number: [id number]  Discharge Plan: Home    Current Diagnoses: Patient Active Problem List   Diagnosis Date Noted  . Dehydration 12/18/2018  . Acute metabolic encephalopathy 123456  . Acute renal failure superimposed on stage 3 chronic kidney disease ([place]) 12/18/2018  . AKI (acute kidney injury) ([place])   . Bilateral lower extremity edema   . HCAP (healthcare-associated pneumonia) 10/22/2018  . Pressure injury of skin 10/22/2018  . E. coli UTI 08/04/2018  . CKD stage 3 due to type 2 diabetes mellitus ([place]) 07/27/2018  . Chronic constipation 07/27/2018  . COPD (chronic obstructive pulmonary disease) ([place]) 07/27/2018  . BPH without urinary obstruction 07/27/2018  . Chronic gout due to renal impairment without tophus 07/27/2018  . Depression with anxiety 07/27/2018  . Mild aortic stenosis 07/27/2018  . Unstable angina ([place]) 07/22/2018  . Demand ischemia ([place])   . Secondary cardiomyopathy ([place])   . Chest pain 07/21/2018  . Elevated troponin 07/21/2018  . Acute on chronic diastolic heart failure ([place])   . CKD stage 3 secondary to diabetes ([place]) 01/01/2014  . Chronic combined systolic and diastolic CHF (congestive heart failure) ([place]) 08/11/2012  . CAD (coronary artery disease) of artery bypass graft 08/08/2012  . Sleep apnea   . Essential  hypertension, benign   . Obesity, Class III, BMI 40-49.9 (morbid obesity) ([place])   . Hyperlipidemia   . Type 2 diabetes mellitus with stage 3 chronic kidney disease, with long-term current use of insulin ([place])   . Coronary artery disease involving native coronary artery of native heart with unstable angina pectoris (HCC)     Orientation RESPIRATION BLADDER Height & Weight     Self, Time  O2 External catheter Weight: 112.3 kg Height:  5\' 5"  (165.1 cm)  BEHAVIORAL SYMPTOMS/MOOD NEUROLOGICAL BOWEL NUTRITION STATUS      Continent Diet(Carb Modified)  AMBULATORY STATUS COMMUNICATION OF NEEDS Skin     Verbally Bruising(arms/back/legs)                       Personal Care Assistance Level of Assistance  Bathing, Feeding, Dressing Bathing Assistance: Limited assistance Feeding assistance: Limited assistance Dressing Assistance: Limited assistance     Functional Limitations Info  Sight, Hearing, Speech Sight Info: Adequate Hearing Info: Adequate Speech Info: Adequate    SPECIAL CARE FACTORS FREQUENCY  PT (By licensed PT)     PT Frequency: 5 times a week              Contractures Contractures Info: Not present    Additional Factors Info  Code Status, Allergies, Psychotropic Code Status Info: DNR Allergies Info: Zocor,lipitor Psychotropic Info: xanax         Current Medications (12/20/2018):  This is the current hospital active medication list Current Facility-Administered Medications  Medication Dose Route Frequency Provider Last Rate Last Dose  .  acetaminophen (TYLENOL) tablet 650 mg  650 mg Oral Q6H PRN Barton Dubois, MD   650 mg at 12/19/18 2008   Or  . acetaminophen (TYLENOL) suppository 650 mg  650 mg Rectal Q6H PRN Barton Dubois, MD      . allopurinol (ZYLOPRIM) tablet 200 mg  200 mg Oral Daily Barton Dubois, MD   200 mg at 12/20/18 0902  . aspirin EC tablet 81 mg  81 mg Oral q morning - 10a Barton Dubois, MD   81 mg at 12/20/18 0901  . calcium carbonate  (dosed in mg elemental calcium) suspension 500 mg of elemental calcium  500 mg of elemental calcium Oral Q6H PRN Barton Dubois, MD   500 mg of elemental calcium at 12/19/18 1252  . camphor-menthol (SARNA) lotion 1 application  1 application Topical [id number] PRN Barton Dubois, MD       And  . hydrOXYzine (ATARAX/VISTARIL) tablet 25 mg  25 mg Oral Q8H PRN Barton Dubois, MD      . Chlorhexidine Gluconate Cloth 2 % PADS 6 each  6 each Topical Daily Barton Dubois, MD   6 each at 12/20/18 [phone]  . citalopram (CELEXA) tablet 10 mg  10 mg Oral Daily Barton Dubois, MD   10 mg at 12/20/18 0902  . clopidogrel (PLAVIX) tablet 75 mg  75 mg Oral Daily Barton Dubois, MD   75 mg at 12/20/18 0902  . docusate sodium (ENEMEEZ) enema 283 mg  1 enema Rectal PRN Barton Dubois, MD      . enoxaparin (LOVENOX) injection 30 mg  30 mg Subcutaneous Q24H Barton Dubois, MD   30 mg at 12/20/18 0901  . feeding supplement (NEPRO CARB STEADY) liquid 237 mL  237 mL Oral TID PRN Barton Dubois, MD      . insulin aspart (novoLOG) injection 0-15 Units  0-15 Units Subcutaneous TID WC Barton Dubois, MD   5 Units at 12/20/18 [phone]  . insulin aspart (novoLOG) injection 0-5 Units  0-5 Units Subcutaneous QHS Barton Dubois, MD      . insulin glargine (LANTUS) injection 60 Units  60 Units Subcutaneous QHS Barton Dubois, MD   60 Units at 12/19/18 2220  . isosorbide mononitrate (IMDUR) 24 hr tablet 30 mg  30 mg Oral BID Barton Dubois, MD   30 mg at 12/20/18 0902  . lactated ringers infusion   Intravenous Continuous Barton Dubois, MD 75 mL/hr at 12/19/18 0950    . MEDLINE mouth rinse  15 mL Mouth Rinse BID Barton Dubois, MD   15 mL at 12/20/18 0906  . metoprolol tartrate (LOPRESSOR) tablet 50 mg  50 mg Oral BID Barton Dubois, MD   50 mg at 12/20/18 0902  . mupirocin ointment (BACTROBAN) 2 % 1 application  1 application Nasal BID Barton Dubois, MD   1 application at 0000000 [phone]  . ondansetron (ZOFRAN) tablet 4 mg  4 mg Oral Q6H PRN  Barton Dubois, MD       Or  . ondansetron [facility]) injection 4 mg  4 mg Intravenous Q6H PRN Barton Dubois, MD   4 mg at 12/20/18 0926  . polyethylene glycol (MIRALAX / GLYCOLAX) packet 17 g  17 g Oral Daily PRN Barton Dubois, MD      . psyllium (HYDROCIL/METAMUCIL) packet 1 packet  1 packet Oral Daily Barton Dubois, MD   1 packet at 12/20/18 [phone]  . sodium chloride flush (NS) 0.9 % injection 3 mL  3 mL Intravenous Q12H Barton Dubois, MD   3 mL  at 12/20/18 0906  . sorbitol 70 % solution 30 mL  30 mL Oral PRN Barton Dubois, MD      . tamsulosin [facility]) capsule 0.4 mg  0.4 mg Oral QPM Barton Dubois, MD   0.4 mg at 12/19/18 1758  . zolpidem (AMBIEN) tablet 5 mg  5 mg Oral QHS PRN Barton Dubois, MD   5 mg at 12/19/18 2219     Discharge Medications: Please see discharge summary for a list of discharge medications.  Relevant Imaging Results:  Relevant Lab Results:   Additional Information SS# [id number]  Boneta Lucks, RN

## 2018-12-20 NOTE — Progress Notes (Addendum)
Inpatient Diabetes Program Recommendations  AACE/ADA: New Consensus Statement on Inpatient Glycemic Control   Target Ranges:  Prepandial:   less than 140 mg/dL      Peak postprandial:   less than 180 mg/dL (1-2 hours)      Critically ill patients:  140 - 180 mg/dL   Results for [name], [name] (MRN [id number]) as of 12/20/2018 14:06  Ref. Range 12/19/2018 07:25 12/19/2018 11:19 12/19/2018 16:15 12/19/2018 22:32 12/20/2018 07:50 12/20/2018 13:17  Glucose-Capillary Latest Ref Range: 70 - 99 mg/dL 152 (H) 178 (H) 190 (H) 193 (H) 216 (H) 304 (H)  Results for [name], [name] (MRN [id number]) as of 12/20/2018 14:06  Ref. Range 12/18/2018 17:31  Hemoglobin A1C Latest Ref Range: 4.8 - 5.6 % 8.8 (H)   Review of Glycemic Control  Diabetes history: DM2 Outpatient Diabetes medications: Toujeo 60 units QHS, Novolog 7 units TID with meals Current orders for Inpatient glycemic control: Lantus 60 units QHS, Novolog 0-15 units TID with meals, Novolog 0-5 units QHS  Inpatient Diabetes Program Recommendations:   Insulin-Basal: Please consider increasing Lantus 62 units QHS.  Insulin - Meal Coverage: Post prandial glucose is consistently elevated.  Please consider ordering Novolog 5 units TID with meals if patient eats at least 50% of meals.  Thanks, Barnie Alderman, RN, MSN, CDE Diabetes Coordinator Inpatient Diabetes Program [phone] (Team Pager from 8am to 5pm)

## 2018-12-21 DIAGNOSIS — E782 Mixed hyperlipidemia: Secondary | ICD-10-CM

## 2018-12-21 LAB — PROTIME-INR
INR: 1.2 (ref 0.8–1.2)
Prothrombin Time: 14.9 seconds (ref 11.4–15.2)

## 2018-12-21 LAB — BASIC METABOLIC PANEL
Anion gap: 8 (ref 5–15)
BUN: 91 mg/dL — ABNORMAL HIGH (ref 8–23)
CO2: 29 mmol/L (ref 22–32)
Calcium: 9.1 mg/dL (ref 8.9–10.3)
Chloride: 100 mmol/L (ref 98–111)
Creatinine, Ser: 1.92 mg/dL — ABNORMAL HIGH (ref 0.61–1.24)
GFR calc Af Amer: 36 mL/min — ABNORMAL LOW (ref >60)
GFR calc non Af Amer: 31 mL/min — ABNORMAL LOW (ref >60)
Glucose, Bld: 175 mg/dL — ABNORMAL HIGH (ref 70–99)
Potassium: 4 mmol/L (ref 3.5–5.1)
Sodium: 137 mmol/L (ref 135–145)

## 2018-12-21 LAB — GLUCOSE, CAPILLARY
Glucose-Capillary: 169 mg/dL — ABNORMAL HIGH (ref 70–99)
Glucose-Capillary: 278 mg/dL — ABNORMAL HIGH (ref 70–99)
Glucose-Capillary: 178 mg/dL — ABNORMAL HIGH (ref 70–99)
Glucose-Capillary: 221 mg/dL — ABNORMAL HIGH (ref 70–99)

## 2018-12-21 LAB — APTT: aPTT: 34 seconds (ref 24–36)

## 2018-12-21 LAB — TROPONIN I (HIGH SENSITIVITY)
Troponin I (High Sensitivity): 322 ng/L (ref <18)
Troponin I (High Sensitivity): 582 ng/L (ref <18)

## 2018-12-21 MED ORDER — ISOSORBIDE MONONITRATE ER 60 MG PO TB24
60.0000 mg | ORAL_TABLET | Freq: Every day | ORAL | Status: DC
  Administered 2018-12-22 – 2018-12-23 (×2): 60 mg via ORAL
  Filled 2018-12-21 (×3): qty 1

## 2018-12-21 MED ORDER — HYDRALAZINE HCL 10 MG PO TABS
10.0000 mg | ORAL_TABLET | Freq: Three times a day (TID) | ORAL | Status: DC
  Administered 2018-12-21 – 2018-12-24 (×10): 10 mg via ORAL
  Filled 2018-12-21 (×10): qty 1

## 2018-12-21 MED ORDER — ISOSORBIDE MONONITRATE ER 60 MG PO TB24
30.0000 mg | ORAL_TABLET | Freq: Once | ORAL | Status: AC
  Administered 2018-12-21: 30 mg via ORAL
  Filled 2018-12-21: qty 1

## 2018-12-21 MED ORDER — HEPARIN BOLUS VIA INFUSION
4000.0000 [IU] | Freq: Once | INTRAVENOUS | Status: AC
  Administered 2018-12-21: 4000 [IU] via INTRAVENOUS
  Filled 2018-12-21: qty 4000

## 2018-12-21 MED ORDER — TRAMADOL HCL 50 MG PO TABS
25.0000 mg | ORAL_TABLET | Freq: Once | ORAL | Status: AC
  Administered 2018-12-21: 20:00:00 25 mg via ORAL
  Filled 2018-12-21: qty 1

## 2018-12-21 MED ORDER — HEPARIN (PORCINE) 25000 UT/250ML-% IV SOLN
1250.0000 [IU]/h | INTRAVENOUS | Status: DC
  Administered 2018-12-21: 1200 [IU]/h via INTRAVENOUS
  Administered 2018-12-23: 11:00:00 1250 [IU]/h via INTRAVENOUS
  Filled 2018-12-21 (×3): qty 250

## 2018-12-21 MED ORDER — INSULIN ASPART 100 UNIT/ML ~~LOC~~ SOLN
4.0000 [IU] | Freq: Three times a day (TID) | SUBCUTANEOUS | Status: DC
  Administered 2018-12-21 – 2018-12-24 (×10): 4 [IU] via SUBCUTANEOUS

## 2018-12-21 MED ORDER — ISOSORBIDE MONONITRATE ER 60 MG PO TB24
30.0000 mg | ORAL_TABLET | Freq: Every day | ORAL | Status: DC
  Administered 2018-12-21 – 2018-12-23 (×3): 30 mg via ORAL
  Filled 2018-12-21 (×3): qty 1

## 2018-12-21 NOTE — Care Management Important Message (Signed)
Important Message  Patient Details  Name: [name] MRN: [id number] Date of Birth: 1934/03/26   Medicare Important Message Given:  Yes     Tommy Medal 12/21/2018, 2:04 PM

## 2018-12-21 NOTE — Progress Notes (Signed)
Physical Therapy Note  Patient Details  Name: [name] MRN: [id number] Date of Birth: [birth year] Today's Date: 12/21/2018    Pt reported he was too nauseated to participate with therapy.  Teena Irani, PTA/CLT [phone]    Roseanne Reno B 12/21/2018, 10:28 AM

## 2018-12-21 NOTE — Progress Notes (Signed)
CRITICAL VALUE ALERT  Critical Value:  Troponin 582  Date & Time Notied:  12/21/2018 1537  Provider Notified: Dr. Shanon Brow Tat  Orders Received/Actions taken: no further orders received

## 2018-12-21 NOTE — Progress Notes (Signed)
CRITICAL VALUE ALERT  Critical Value:  Troponin 322  Date & Time Notied: 12/21/2018 at 1231  Provider Notified: Dr. Shanon Brow Tat  Orders Received/Actions taken: obtaining EKG, Dr. Carles Collet adjusting patient's BP medications and increasing Imdur, will continue to monitor patient.

## 2018-12-21 NOTE — Progress Notes (Signed)
Inpatient Diabetes Program Recommendations  AACE/ADA: New Consensus Statement on Inpatient Glycemic Control   Target Ranges:  Prepandial:   less than 140 mg/dL      Peak postprandial:   less than 180 mg/dL (1-2 hours)      Critically ill patients:  140 - 180 mg/dL   Results for [name], [name] (MRN [id number]) as of 12/21/2018 10:17  Ref. Range 12/20/2018 07:50 12/20/2018 13:17 12/20/2018 16:40 12/20/2018 21:47 12/21/2018 07:54  Glucose-Capillary Latest Ref Range: 70 - 99 mg/dL 216 (H) 304 (H) 209 (H) 176 (H) 169 (H)   Review of Glycemic Control  Diabetes history:DM2 Outpatient Diabetes medications: Toujeo 60 units QHS, Novolog 7 units TID with meals Current orders for Inpatient glycemic control: Lantus 60 units QHS, Novolog 0-15 units TID with meals, Novolog 0-5 units QHS  Inpatient Diabetes Program Recommendations:    Insulin - Meal Coverage:Post prandial glucose is consistently elevated.Please consider ordering Novolog 4 units TID with meals if patient eats at least 50% of meals.  Thanks, Barnie Alderman, RN, MSN, CDE Diabetes Coordinator Inpatient Diabetes Program [phone] (Team Pager from 8am to 5pm)

## 2018-12-21 NOTE — Progress Notes (Addendum)
PROGRESS NOTE  [name] Chuck [id number] DOB: [birth year] DOA: 12/18/2018 PCP: [name], MD  Brief History:   85 y.o. male with medical history significant of DM; CAD s/p stents; OSA; morbid obesity (BMI 63); HTN; HLD; and COPD presenting with AMS s/p fall.  Patient is a poor historian.  He was last admitted from 7/11-14 for LLL PNA and acute on chronic renal failure with d/c creatinine 2.3. ED Course:Weakness, dehydration, markedly elevated BUN (but 100 last admission), CKD, decreased PO intake, not altered. Head CT neg despite recent fall. CXR ok and no respiratory symptoms. Dr. Jonnie Finner suggests observation, IVF, monitoring.       Assessment/Plan: acute on chronic renal failure--CKD3 -appears to be in the setting of dehydration and hypotension -baseline creatinine 1.9-2.2 -serum creatinine peaked 3.52 -AM BMP  acute metabolic encephalopathy  -associated with ongoing dehydration and uremia -CT head negative -CT C-spine neg -Mentation improved--now back to baseline per grand daughter -Currently oriented x2 and able to follow commands properly  Chronic combined systolic and diastolic CHF -Stable and compensated -07/21/18  echo demonstrating 40 to 45% ejection fraction -daily weights -resume demadex on 12/21/2018  Chest Pain/CAD/Elevated troponin -pt had chest pain am 12/21/18 -check tropoinins--322 -not consistent with ACS -check EKG--personally reviewed--sinus, nonspecific STT changes -not a candidate for aggressive intervention -restart hydralazine -increase imdur to 60 mg am; 30 mg pm -consult cardiology  chronic respiratory failure with hypoxia -No wheezing and no complaints of shortness of breath -Continue oxygen supplementation--2.5 L  -Continue PRN BDs  COPD -stable on home 2.5L -no wheezing  Essential hypertension, benign -meds initially held due to soft BP -restarted metoprolol -restart hydralazine -Continue heart healthy  diet.  Obesity, Class III, BMI 40-49.9 (morbid obesity) (HCC) -Body mass index is 41.2 kg/m. -Low calorie diet and portion control discussed with patient.  Hyperlipidemia -Patient has reported allergies to the use of Zocor and Lipitor in the past -Continue heart healthy/low-fat diet -hx of statin intolerance  Type 2 diabetes mellitus with stage 3 chronic kidney disease, with long-term current use of insulin ([place]) -12/18/18 A1C--8.8 -Continue sliding scale insulin and long-acting insulin -add novolog 4 units tiw   Sleep apnea -Continue the use of CPAP nightly      Disposition Plan:   SNF 9/10 if stable  Family Communication:  No Family at bedside  Consultants:  cardiology  Code Status:DNR  DVT Prophylaxis: [place] Lovenox   Procedures: As Listed in Progress Note Above  Antibiotics: None      Subjective: Pt had episode of chest pressure this am, no resolved.  Denies f/c, sob, n/v/d, abd pain, cough  Objective: Vitals:   12/20/18 2146 12/21/18 0632 12/21/18 0731 12/21/18 1117  BP: (!) 133/45 (!) 151/50  137/66  Pulse: (!) 57 68  84  Resp: 20 20  20   Temp: 98.6 F (37 C) 98.2 F (36.8 C)    TempSrc: Oral Oral    SpO2: 95% 95% 93% 94%  Weight:      Height:        Intake/Output Summary (Last 24 hours) at 12/21/2018 1212 Last data filed at 12/21/2018 0916 Gross per 24 hour  Intake 2055.37 ml  Output 1050 ml  Net 1005.37 ml   Weight change:  Exam:   General:  Pt is alert, follows commands appropriately, not in acute distress  HEENT: No icterus, No thrush, No neck mass, [place]/AT  Cardiovascular: RRR, S1/S2, no rubs, no gallops  Respiratory:  bibasilar rales, no wheeze  Abdomen: Soft/+BS, non tender, non distended, no guarding  Extremities: trace LE edema, No lymphangitis, No petechiae, No rashes, no synovitis   Data Reviewed: I have personally reviewed following labs and imaging studies Basic Metabolic Panel: Recent Labs  Lab 12/18/18 1716  12/19/18 0429 12/20/18 0515 12/21/18 0558  NA 133* 138 137 137  K 3.5 3.0* 3.6 4.0  CL 92* 98 99 100  CO2 30 30 29 29   GLUCOSE 252* 183* 211* 175*  BUN 153* 137* 110* 91*  CREATININE 3.52* 2.81* 2.31* 1.92*  CALCIUM 8.7* 8.9 9.1 9.1  MG  --   --  1.8  --    Liver Function Tests: Recent Labs  Lab 12/18/18 1716  AST 12*  ALT 10  ALKPHOS 60  BILITOT 0.3  PROT 6.9  ALBUMIN 3.7   No results for input(s): LIPASE, AMYLASE in the last 168 hours. Recent Labs  Lab 12/18/18 1717  AMMONIA 24   Coagulation Profile: No results for input(s): INR, PROTIME in the last 168 hours. CBC: Recent Labs  Lab 12/18/18 1716 12/19/18 0429  WBC 7.9 7.1  NEUTROABS 5.8  --   HGB 11.8* 11.5*  HCT 38.7* 37.3*  MCV 93.7 94.2  PLT 134* 136*   Cardiac Enzymes: No results for input(s): CKTOTAL, CKMB, CKMBINDEX, TROPONINI in the last 168 hours. BNP: Invalid input(s): POCBNP CBG: Recent Labs  Lab 12/20/18 1317 12/20/18 1640 12/20/18 2147 12/21/18 0754 12/21/18 1122  GLUCAP 304* 209* 176* 169* 278*   HbA1C: Recent Labs    12/18/18 1731  HGBA1C 8.8*   Urine analysis:    Component Value Date/Time   COLORURINE STRAW (A) 12/18/2018 1820   APPEARANCEUR CLEAR 12/18/2018 1820   LABSPEC 1.010 12/18/2018 1820   PHURINE 5.0 12/18/2018 1820   GLUCOSEU NEGATIVE 12/18/2018 1820   HGBUR NEGATIVE 12/18/2018 1820   BILIRUBINUR NEGATIVE 12/18/2018 1820   KETONESUR NEGATIVE 12/18/2018 1820   PROTEINUR NEGATIVE 12/18/2018 1820   NITRITE NEGATIVE 12/18/2018 1820   LEUKOCYTESUR NEGATIVE 12/18/2018 1820   Sepsis Labs: @LABRCNTIP (procalcitonin:4,lacticidven:4) ) Recent Results (from the past 240 hour(s))  Urine culture     Status: None   Collection Time: 12/18/18  6:20 PM   Specimen: Urine, Clean Catch  Result Value Ref Range Status   Specimen Description   Final    URINE, CLEAN CATCH Performed at [facility], [address], [place], [place] [address]    Special Requests   Final     NONE Performed at [facility], [address] [place], [place] [address]    Culture   Final    NO GROWTH Performed at [facility], [place] [address] [place], [place] [address]    Report Status 12/19/2018 FINAL  Final  SARS Coronavirus 2 [facility] order, Performed in [facility] hospital lab) Nasopharyngeal Nasopharyngeal Swab     Status: None   Collection Time: 12/18/18  7:30 PM   Specimen: Nasopharyngeal Swab  Result Value Ref Range Status   SARS Coronavirus 2 NEGATIVE NEGATIVE Final    Comment: (NOTE) If result is NEGATIVE SARS-CoV-2 target nucleic acids are NOT DETECTED. The SARS-CoV-2 RNA is generally detectable in upper and lower  respiratory specimens during the acute phase of infection. The lowest  concentration of SARS-CoV-2 viral copies this assay can detect is 250  copies / mL. A negative result does not preclude SARS-CoV-2 infection  and should not be used as the sole basis for treatment or other  patient management decisions.  A negative result may occur with  improper specimen collection / handling, submission of specimen other  than nasopharyngeal swab, presence of viral mutation(s) within the  areas targeted by this assay, and inadequate number of viral copies  (<250 copies / mL). A negative result must be combined with clinical  observations, patient history, and epidemiological information. If result is POSITIVE SARS-CoV-2 target nucleic acids are DETECTED. The SARS-CoV-2 RNA is generally detectable in upper and lower  respiratory specimens dur ing the acute phase of infection.  Positive  results are indicative of active infection with SARS-CoV-2.  Clinical  correlation with patient history and other diagnostic information is  necessary to determine patient infection status.  Positive results do  not rule out bacterial infection or co-infection with other viruses. If result is PRESUMPTIVE POSTIVE SARS-CoV-2 nucleic acids MAY BE PRESENT.   A presumptive  positive result was obtained on the submitted specimen  and confirmed on repeat testing.  While 2019 novel coronavirus  (SARS-CoV-2) nucleic acids may be present in the submitted sample  additional confirmatory testing may be necessary for epidemiological  and / or clinical management purposes  to differentiate between  SARS-CoV-2 and other Sarbecovirus currently known to infect humans.  If clinically indicated additional testing with an alternate test  methodology [phone]) is advised. The SARS-CoV-2 RNA is generally  detectable in upper and lower respiratory sp ecimens during the acute  phase of infection. The expected result is Negative. Fact Sheet for Patients:  StrictlyIdeas.no Fact Sheet for Healthcare Providers: BankingDealers.co.za This test is not yet approved or cleared by the [place] FDA and has been authorized for detection and/or diagnosis of SARS-CoV-2 by FDA under an Emergency Use Authorization (EUA).  This EUA will remain in effect (meaning this test can be used) for the duration of the COVID-19 declaration under Section 564(b)(1) of the Act, 21 U.S.C. section 360bbb-3(b)(1), unless the authorization is terminated or revoked sooner. Performed at [facility], [address], [place], [place] [address]   MRSA PCR Screening     Status: Abnormal   Collection Time: 12/18/18 10:47 PM   Specimen: Nasal Mucosa; Nasopharyngeal  Result Value Ref Range Status   MRSA by PCR POSITIVE (A) NEGATIVE Final    Comment:        The GeneXpert MRSA Assay (FDA approved for NASAL specimens only), is one component of a comprehensive MRSA colonization surveillance program. It is not intended to diagnose MRSA infection nor to guide or monitor treatment for MRSA infections. RESULT CALLED TO, READ BACK BY AND VERIFIED WITH: AMBURN,A @ 0324 ON 12/19/18 BY JUW Performed at [facility], [address] [place], [place] [address]       Scheduled Meds:  allopurinol  200 mg Oral Daily   aspirin EC  81 mg Oral q morning - 10a   Chlorhexidine Gluconate Cloth  6 each Topical Daily   citalopram  10 mg Oral Daily   clopidogrel  75 mg Oral Daily   enoxaparin (LOVENOX) injection  30 mg Subcutaneous Q24H   insulin aspart  0-15 Units Subcutaneous TID WC   insulin aspart  0-5 Units Subcutaneous QHS   insulin glargine  60 Units Subcutaneous QHS   isosorbide mononitrate  30 mg Oral BID   mouth rinse  15 mL Mouth Rinse BID   metoprolol tartrate  50 mg Oral BID   mupirocin ointment  1 application Nasal BID   psyllium  1 packet Oral Daily   sodium chloride flush  3 mL  Intravenous Q12H   tamsulosin  0.4 mg Oral QPM   torsemide  20 mg Oral Daily   Continuous Infusions:  lactated ringers 75 mL/hr at 12/20/18 1327    Procedures/Studies: Ct Head Wo Contrast  Result Date: 12/18/2018 CLINICAL DATA:  85 year old male with fall. EXAM: CT HEAD WITHOUT CONTRAST CT CERVICAL SPINE WITHOUT CONTRAST TECHNIQUE: Multidetector CT imaging of the head and cervical spine was performed following the standard protocol without intravenous contrast. Multiplanar CT image reconstructions of the cervical spine were also generated. COMPARISON:  Head CT dated 02/08/2017 FINDINGS: Evaluation of this exam is limited due to motion artifact. CT HEAD FINDINGS Brain: There is mild age-related atrophy and chronic microvascular ischemic changes. There is no acute intracranial hemorrhage. No mass effect or midline shift. No extra-axial fluid collection. Vascular: No hyperdense vessel or unexpected calcification. Skull: Normal. Negative for fracture or focal lesion. Sinuses/Orbits: No acute finding. Other: None CT CERVICAL SPINE FINDINGS Alignment: No acute subluxation. Skull base and vertebrae: No acute fracture. Soft tissues and spinal canal: No prevertebral fluid or swelling. No visible canal hematoma. Disc levels:  Multilevel degenerative changes. Upper  chest: Negative. Other: Bilateral carotid bulb calcified plaques. IMPRESSION: 1. No acute intracranial hemorrhage. 2. No acute/traumatic cervical spine pathology. Electronically Signed   By: Anner Crete M.D.   On: 12/18/2018 19:04   Ct Cervical Spine Wo Contrast  Result Date: 12/18/2018 CLINICAL DATA:  85 year old male with fall. EXAM: CT HEAD WITHOUT CONTRAST CT CERVICAL SPINE WITHOUT CONTRAST TECHNIQUE: Multidetector CT imaging of the head and cervical spine was performed following the standard protocol without intravenous contrast. Multiplanar CT image reconstructions of the cervical spine were also generated. COMPARISON:  Head CT dated 02/08/2017 FINDINGS: Evaluation of this exam is limited due to motion artifact. CT HEAD FINDINGS Brain: There is mild age-related atrophy and chronic microvascular ischemic changes. There is no acute intracranial hemorrhage. No mass effect or midline shift. No extra-axial fluid collection. Vascular: No hyperdense vessel or unexpected calcification. Skull: Normal. Negative for fracture or focal lesion. Sinuses/Orbits: No acute finding. Other: None CT CERVICAL SPINE FINDINGS Alignment: No acute subluxation. Skull base and vertebrae: No acute fracture. Soft tissues and spinal canal: No prevertebral fluid or swelling. No visible canal hematoma. Disc levels:  Multilevel degenerative changes. Upper chest: Negative. Other: Bilateral carotid bulb calcified plaques. IMPRESSION: 1. No acute intracranial hemorrhage. 2. No acute/traumatic cervical spine pathology. Electronically Signed   By: Anner Crete M.D.   On: 12/18/2018 19:04   Dg Chest Portable 1 View  Result Date: 12/18/2018 CLINICAL DATA:  AMS EXAM: PORTABLE CHEST 1 VIEW COMPARISON:  Chest radiographs 12/06/2018, 10/22/2018 FINDINGS: Stable cardiomediastinal contours status post median sternotomy. Cardiomegaly. Aortic arch calcification. There is central vascular congestion. There are right basilar opacities favored to  represent atelectasis. There is opacification of the left hemidiaphragm which may represent atelectasis, infiltrate not excluded. No pneumothorax. No acute osseous abnormality in the visualized skeleton. IMPRESSION: 1. Right basilar opacities favored to represent atelectasis. 2. Opacification of the left hemidiaphragm may represent atelectasis, infiltrate not excluded. This could be better assessed with PA and lateral radiographs. 3. Cardiomegaly with central venous congestion. Electronically Signed   By: Audie Pinto M.D.   On: 12/18/2018 18:13    Orson Eva, DO  [facility] Pager [phone]  If 7PM-7AM, please contact night-coverage [email or web address] Password [email or web address] 12/21/2018, 12:12 PM   LOS: 2 days

## 2018-12-21 NOTE — Progress Notes (Signed)
ANTICOAGULATION CONSULT NOTE - Initial Consult  Pharmacy Consult for heparin Indication: ACS/STEMI  Allergies  Allergen Reactions  . Zocor [Simvastatin] Other (See Comments)    fatigue  . Lipitor [Atorvastatin] Other (See Comments)    Muscle cramping    Patient Measurements: Height: 5\' 5"  (165.1 cm) Weight: 247 lb 9.2 oz (112.3 kg) IBW/kg (Calculated) : 61.5 Heparin Dosing Weight: 88 kg  Vital Signs: Temp: 98.5 F (36.9 C) (09/09 1445) Temp Source: Oral (09/09 [id number]) BP: 119/65 (09/09 1445) Pulse Rate: 111 (09/09 1445)  Labs: Recent Labs    12/18/18 1716 12/19/18 0429 12/20/18 0515 12/21/18 0558 12/21/18 1141 12/21/18 1438  HGB 11.8* 11.5*  --   --   --   --   HCT 38.7* 37.3*  --   --   --   --   PLT 134* 136*  --   --   --   --   CREATININE 3.52* 2.81* 2.31* 1.92*  --   --   TROPONINIHS  --   --   --   --  322* 582*    Estimated Creatinine Clearance: 33.7 mL/min (A) (by C-G formula based on SCr of 1.92 mg/dL (H)).   Medical History: Past Medical History:  Diagnosis Date  . Allergic rhinitis   . Anxiety   . Arthritis   . COPD (chronic obstructive pulmonary disease) ([place])   . Coronary atherosclerosis of native coronary artery    a. CABG x 4 in 1994. Previous stent placement to SVG to distal RCA;  b. DES to native Cx in 2006;  c. DES SVG to RCA 05/08/11; d. Inf MI/Cath/PCI: LM 25, LAD 100, LCX patent stent, 50 into OM, RI 60-70, RCA 100, LIMA->LAD nl, VG->Diag 100 old, VG->RCA/PL 99 (DES x 1), EF 50-55%.  . Depression    With anxious features/hx panic attacks  . Essential hypertension, benign   . GERD (gastroesophageal reflux disease)   . Hyperlipidemia    Not on statin 2/2 hx of intolerance.   . Morbid obesity ([place])   . Nephrolithiasis   . Pneumonia   . Sleep apnea   . ST elevation myocardial infarction (STEMI) of inferior wall ([place])    4/14 - DES SVG to PDA  . Type 2 diabetes mellitus (HCC)     Medications:  Medications Prior to Admission   Medication Sig Dispense Refill Last Dose  . acetaminophen (TYLENOL) 325 MG tablet Take 650 mg by mouth every 4 (four) hours as needed.   12/17/2018  . allopurinol (ZYLOPRIM) 100 MG tablet Take 2 tablets (200 mg total) by mouth daily. 60 tablet 0 12/18/2018 at Unknown time  . alprazolam (XANAX) 2 MG tablet Take 1 tablet by mouth 2 (two) times a day.   12/17/2018  . aspirin EC 81 MG tablet Take 81 mg by mouth every morning.   12/18/2018 at Unknown time  . clopidogrel (PLAVIX) 75 MG tablet TAKE 1 TABLET BY MOUTH EVERY DAY WITH BREAKFAST 90 tablet 1 12/18/2018 at Unknown time  . escitalopram (LEXAPRO) 5 MG tablet Take 1 tablet by mouth daily.   12/18/2018 at Unknown time  . hydrALAZINE (APRESOLINE) 10 MG tablet TAKE 1 TABLET(10 MG) BY MOUTH EVERY 8 HOURS 90 tablet 1 12/18/2018 at Unknown time  . insulin aspart (NOVOLOG) 100 UNIT/ML injection Inject 7 Units into the skin 3 (three) times daily with meals. Give only if eats 50% or more of meal. 10 mL 0 12/18/2018 at Unknown time  . Insulin Glargine, 2 Unit Dial, (  TOUJEO MAX SOLOSTAR) 300 UNIT/ML SOPN Inject 60 Units into the skin at bedtime. 15 mL 0 12/17/2018 at Unknown time  . isosorbide mononitrate (IMDUR) 30 MG 24 hr tablet TAKE 1 TABLET(30 MG) BY MOUTH TWICE DAILY 60 tablet 11 12/17/2018 at Unknown time  . metoprolol tartrate (LOPRESSOR) 50 MG tablet TAKE 1 TABLET(50 MG) BY MOUTH TWICE DAILY 60 tablet 11 12/18/2018 at 0900  . nitroGLYCERIN (NITROLINGUAL) 0.4 MG/SPRAY spray Place 1 spray under the tongue every 5 (five) minutes x 3 doses as needed for chest pain. 12 g 0 12/17/2018  . ondansetron (ZOFRAN) 4 MG tablet Take 1 tablet (4 mg total) by mouth every 8 (eight) hours as needed for nausea or vomiting. 10 tablet 0 12/17/2018  . OXYGEN Inhale 2 L/min into the lungs continuous.   12/17/2018  . polyethylene glycol powder (GLYCOLAX/MIRALAX) powder Take 17 g by mouth daily as needed (constipation).    12/17/2018 at Unknown time  . psyllium (METAMUCIL) 58.6 % powder Take 2 tsp by  mouth: Mix with 8 ounces liquid and drink once a day   12/17/2018 at Unknown time  . tamsulosin (FLOMAX) 0.4 MG CAPS capsule Take 1 capsule (0.4 mg total) by mouth every evening. 30 capsule 0 12/17/2018 at Unknown time  . torsemide (DEMADEX) 20 MG tablet Take 1 tablet (20 mg total) by mouth daily. Beginning 08/05/2018 30 tablet 1 12/17/2018  . UNABLE TO FIND CPAP from home while sleeping, at previous home settings   12/17/2018    Assessment: Pharmacy consulted to dose heparin in patient with ACS/STEMI-patient with elevated troponin at 582.  Patient is not on anticoagulation prior to admission.  Goal of Therapy:  Heparin level 0.3-0.7 units/ml Monitor platelets by anticoagulation protocol: Yes   Plan:  Give 4000 units bolus x 1 Start heparin infusion at 1200 units/hr Check anti-Xa level in 6-8 hours and daily while on heparin Continue to monitor H&H and platelets  [name] 12/21/2018,3:40 PM

## 2018-12-21 NOTE — Progress Notes (Signed)
Refusing CPAP tonight. Patient is on 2L with 02 saturations at 94%. CPAP is at bedside. RT will continue to monitor.

## 2018-12-22 ENCOUNTER — Inpatient Hospital Stay (HOSPITAL_COMMUNITY): Payer: PPO

## 2018-12-22 ENCOUNTER — Encounter (HOSPITAL_COMMUNITY): Payer: Self-pay | Admitting: Cardiology

## 2018-12-22 DIAGNOSIS — I214 Non-ST elevation (NSTEMI) myocardial infarction: Secondary | ICD-10-CM

## 2018-12-22 DIAGNOSIS — I200 Unstable angina: Secondary | ICD-10-CM

## 2018-12-22 LAB — BASIC METABOLIC PANEL
Anion gap: 7 (ref 5–15)
BUN: 82 mg/dL — ABNORMAL HIGH (ref 8–23)
CO2: 30 mmol/L (ref 22–32)
Calcium: 8.8 mg/dL — ABNORMAL LOW (ref 8.9–10.3)
Chloride: 98 mmol/L (ref 98–111)
Creatinine, Ser: 1.97 mg/dL — ABNORMAL HIGH (ref 0.61–1.24)
GFR calc Af Amer: 35 mL/min — ABNORMAL LOW (ref >60)
GFR calc non Af Amer: 31 mL/min — ABNORMAL LOW (ref >60)
Glucose, Bld: 136 mg/dL — ABNORMAL HIGH (ref 70–99)
Potassium: 4 mmol/L (ref 3.5–5.1)
Sodium: 135 mmol/L (ref 135–145)

## 2018-12-22 LAB — POCT I-STAT, CHEM 8
BUN: >140 mg/dL — ABNORMAL HIGH (ref 8–23)
Calcium, Ion: 1.11 mmol/L — ABNORMAL LOW (ref 1.15–1.40)
Chloride: 90 mmol/L — ABNORMAL LOW (ref 98–111)
Creatinine, Ser: 3.4 mg/dL — ABNORMAL HIGH (ref 0.61–1.24)
Glucose, Bld: 239 mg/dL — ABNORMAL HIGH (ref 70–99)
HCT: 39 % (ref 39.0–52.0)
Hemoglobin: 13.3 g/dL (ref 13.0–17.0)
Potassium: 3.5 mmol/L (ref 3.5–5.1)
Sodium: 135 mmol/L (ref 135–145)
TCO2: 29 mmol/L (ref 22–32)

## 2018-12-22 LAB — GLUCOSE, CAPILLARY
Glucose-Capillary: 89 mg/dL (ref 70–99)
Glucose-Capillary: 118 mg/dL — ABNORMAL HIGH (ref 70–99)
Glucose-Capillary: 157 mg/dL — ABNORMAL HIGH (ref 70–99)
Glucose-Capillary: 182 mg/dL — ABNORMAL HIGH (ref 70–99)

## 2018-12-22 LAB — CBC
HCT: 36.3 % — ABNORMAL LOW (ref 39.0–52.0)
Hemoglobin: 11.1 g/dL — ABNORMAL LOW (ref 13.0–17.0)
MCH: 28.6 pg (ref 26.0–34.0)
MCHC: 30.6 g/dL (ref 30.0–36.0)
MCV: 93.6 fL (ref 80.0–100.0)
Platelets: 141 10*3/uL — ABNORMAL LOW (ref 150–400)
RBC: 3.88 MIL/uL — ABNORMAL LOW (ref 4.22–5.81)
RDW: 13.9 % (ref 11.5–15.5)
WBC: 5.7 10*3/uL (ref 4.0–10.5)
nRBC: 0 % (ref 0.0–0.2)

## 2018-12-22 LAB — HEPARIN LEVEL (UNFRACTIONATED)
Heparin Unfractionated: 0.46 IU/mL (ref 0.30–0.70)
Heparin Unfractionated: 0.53 IU/mL (ref 0.30–0.70)

## 2018-12-22 LAB — TROPONIN I (HIGH SENSITIVITY)
Troponin I (High Sensitivity): 557 ng/L (ref <18)
Troponin I (High Sensitivity): 379 ng/L (ref <18)

## 2018-12-22 LAB — MAGNESIUM: Magnesium: 1.7 mg/dL (ref 1.7–2.4)

## 2018-12-22 NOTE — Consult Note (Addendum)
Cardiology Consultation:   Patient ID: [name] MRN: [id number]; DOB: July 05, 1933  Admit date: 12/18/2018 Date of Consult: 12/22/2018  Primary Care Provider: [name], MD Primary Cardiologist: [name], MD  Primary Electrophysiologist:  None    Patient Profile:   [name] is a 85 y.o. male with a hx of CAD status post CABG 1994 with graft [name] and mutiple PCIs, chronic diastolic heart failure, hypertension, type 2 diabetes mellitus, COPD, and CKD stage III, statin intolerance, morbid obesity who is being seen today for the evaluation of elevated troponin at the request of Dr. Carles Collet.  History of Present Illness:   [name] 3/5 patent bypass grafts and multiple PCIs and last cath 2014 with inferior STEMI   Native vessels with occlusion in LAD and RCA of 100% and LM 25% stenosis, LCX 60-70% stenosis in somewhat narrow caliber vessel and stable. AV groove with stent with mild luminal irregularities but patent There is a step off in the caliber of the vessel immediately after the stent with long 50% stenosis leading into a large obtuse marginal. The obtuse marginal is free of high grade [name].  LIMA to LAD paatent, VG to diag, known occlusion, VG to distal RVA/PL with; proximal stents widely patent and mid stented segment in body of SVG with 99% hazy stenosis and underwent PTCA/DES to mid body of VG to PDA.     last echo 07/2018  With EF 40-45% impaired relaxation possible mild AS   Now presents to [facility] 12/18/18 with AMS after a fall hitting head a few days to a week prior to admit. Was hypotensive Cr was 2.3 in July with LLL PNA.  Head CT ok , admitted and IVF given.  BP meds held.  He is on home 02 for COPD. + morbid obesity , OSA on CPAP.  Acute metabolic encephalopathy associated with ongoing dehydration and uremia.  Pt has been improving until 12/21/18 and developed chest pain.  A pain mid sternal  with no radiation and no associated symptoms.  He did have nausea but this is chronic and has at home as well.  No chest pain prior to admit that pt can remember.  Does have some lower ext edema.      EKG:  The EKG was personally reviewed and demonstrates:  SR with PVC and no acute ST changes form July on 12/18/18   On 12/21/18 SR with freq PVCs and ST depression in I but no other acute changes except increased PVCs. Telemetry:  Telemetry was personally reviewed and demonstrates:  SR with PVCs and 10 beats of V. Tach.   Troponin 322 and 582  Na 136, K+ 4.0 BUN 82 and Cr down to 1.97 from 3.52 on admit  Hgb 11.1, plts 141   Today BP 131/52 P 67 no pain now.  No complaints, just finished BK.   Heart Pathway Score:     Past Medical History:  Diagnosis Date   Allergic rhinitis    Anxiety    Arthritis    COPD (chronic obstructive pulmonary [name]) (HCC)    Coronary atherosclerosis of native coronary artery    a. CABG x 4 in 1994. Previous stent placement to SVG to distal RCA;  b. DES to native Cx in 2006;  c. DES SVG to RCA 05/08/11; d. Inf MI/Cath/PCI: LM 25, LAD 100, LCX patent stent, 50 into OM, RI 60-70, RCA 100,  LIMA->LAD nl, VG->Diag 100 old, VG->RCA/PL 99 (DES x 1), EF 50-55%.   Depression    With anxious features/hx panic attacks   Essential hypertension, benign    GERD (gastroesophageal reflux [name])    Hyperlipidemia    Not on statin 2/2 hx of intolerance.    Morbid obesity ([place])    Nephrolithiasis    Pneumonia    Sleep apnea    ST elevation myocardial infarction (STEMI) of inferior wall ([place])    4/14 - DES SVG to PDA   Type 2 diabetes mellitus ([place])     Past Surgical History:  Procedure Laterality Date   CORONARY ARTERY BYPASS GRAFT  1994   EYE SURGERY     LEFT HEART CATHETERIZATION WITH CORONARY ANGIOGRAM N/A 05/08/2011   Procedure: LEFT HEART CATHETERIZATION WITH CORONARY ANGIOGRAM;  Surgeon: [name], MD;  Location: [facility] CATH LAB;  Service:  Cardiovascular;  Laterality: N/A;   LEFT HEART CATHETERIZATION WITH CORONARY ANGIOGRAM N/A 08/08/2012   Procedure: LEFT HEART CATHETERIZATION WITH CORONARY ANGIOGRAM;  Surgeon: [name], MD;  Location: [facility] CATH LAB;  Service: Cardiovascular;  Laterality: N/A;   PERCUTANEOUS CORONARY STENT INTERVENTION (PCI-S) Right 05/08/2011   Procedure: PERCUTANEOUS CORONARY STENT INTERVENTION (PCI-S);  Surgeon: [name], MD;  Location: [facility] CATH LAB;  Service: Cardiovascular;  Laterality: Right;   PERCUTANEOUS CORONARY STENT INTERVENTION (PCI-S) N/A 08/08/2012   Procedure: PERCUTANEOUS CORONARY STENT INTERVENTION (PCI-S);  Surgeon: [name], MD;  Location: [facility] CATH LAB;  Service: Cardiovascular;  Laterality: N/A;     Home Medications:  Prior to Admission medications   Medication Sig Start Date End Date Taking? Authorizing Provider  acetaminophen (TYLENOL) 325 MG tablet Take 650 mg by mouth every 4 (four) hours as needed.   Yes [provider]  allopurinol (ZYLOPRIM) 100 MG tablet Take 2 tablets (200 mg total) by mouth daily. 08/17/18  Yes Gerlene Fee, NP  alprazolam Duanne Moron) 2 MG tablet Take 1 tablet by mouth 2 (two) times a day. 09/20/18  Yes [provider]  aspirin EC 81 MG tablet Take 81 mg by mouth every morning.   Yes [provider]  clopidogrel (PLAVIX) 75 MG tablet TAKE 1 TABLET BY MOUTH EVERY DAY WITH BREAKFAST 11/28/18  Yes Satira Sark, MD  escitalopram (LEXAPRO) 5 MG tablet Take 1 tablet by mouth daily. 11/29/18  Yes [provider]  hydrALAZINE (APRESOLINE) 10 MG tablet TAKE 1 TABLET(10 MG) BY MOUTH EVERY 8 HOURS 10/31/18  Yes Satira Sark, MD  insulin aspart (NOVOLOG) 100 UNIT/ML injection Inject 7 Units into the skin 3 (three) times daily with meals. Give only if eats 50% or more of meal. 08/17/18  Yes Gerlene Fee, NP  Insulin Glargine, 2 Unit Dial, (TOUJEO MAX SOLOSTAR) 300 UNIT/ML SOPN Inject 60 Units into the  skin at bedtime. 08/17/18  Yes Gerlene Fee, NP  isosorbide mononitrate (IMDUR) 30 MG 24 hr tablet TAKE 1 TABLET(30 MG) BY MOUTH TWICE DAILY 10/19/18  Yes Satira Sark, MD  metoprolol tartrate (LOPRESSOR) 50 MG tablet TAKE 1 TABLET(50 MG) BY MOUTH TWICE DAILY 10/19/18  Yes Satira Sark, MD  nitroGLYCERIN (NITROLINGUAL) 0.4 MG/SPRAY spray Place 1 spray under the tongue every 5 (five) minutes x 3 doses as needed for chest pain. 08/17/18  Yes Gerlene Fee, NP  ondansetron (ZOFRAN) 4 MG tablet Take 1 tablet (4 mg total) by mouth every 8 (eight) hours as needed for nausea or vomiting. 08/17/18  Yes Green,  Phylis Bougie, NP  OXYGEN Inhale 2 L/min into the lungs continuous.   Yes [provider]  polyethylene glycol powder (GLYCOLAX/MIRALAX) powder Take 17 g by mouth daily as needed (constipation).  02/08/13  Yes [provider]  psyllium (METAMUCIL) 58.6 % powder Take 2 tsp by mouth: Mix with 8 ounces liquid and drink once a day   Yes [provider]  tamsulosin (FLOMAX) 0.4 MG CAPS capsule Take 1 capsule (0.4 mg total) by mouth every evening. 08/17/18  Yes Gerlene Fee, NP  torsemide (DEMADEX) 20 MG tablet Take 1 tablet (20 mg total) by mouth daily. Beginning 08/05/2018 10/26/18  Yes Barton Dubois, MD  UNABLE TO FIND CPAP from home while sleeping, at previous home settings   Yes [provider]    Inpatient Medications: Scheduled Meds:  allopurinol  200 mg Oral Daily   aspirin EC  81 mg Oral q morning - 10a   Chlorhexidine Gluconate Cloth  6 each Topical Daily   citalopram  10 mg Oral Daily   clopidogrel  75 mg Oral Daily   hydrALAZINE  10 mg Oral Q8H   insulin aspart  0-15 Units Subcutaneous TID WC   insulin aspart  0-5 Units Subcutaneous QHS   insulin aspart  4 Units Subcutaneous TID WC   insulin glargine  60 Units Subcutaneous QHS   isosorbide mononitrate  30 mg Oral QHS   isosorbide mononitrate  60 mg Oral Daily   mouth rinse  15 mL  Mouth Rinse BID   metoprolol tartrate  50 mg Oral BID   mupirocin ointment  1 application Nasal BID   psyllium  1 packet Oral Daily   sodium chloride flush  3 mL Intravenous Q12H   tamsulosin  0.4 mg Oral QPM   torsemide  20 mg Oral Daily   Continuous Infusions:  heparin 1,200 Units/hr (12/22/18 0313)   lactated ringers 75 mL/hr at 12/22/18 0313   PRN Meds: acetaminophen **OR** acetaminophen, calcium carbonate (dosed in mg elemental calcium), camphor-menthol **AND** hydrOXYzine, docusate sodium, feeding supplement (NEPRO CARB STEADY), ondansetron **OR** ondansetron (ZOFRAN) IV, polyethylene glycol, sorbitol, zolpidem  Allergies:    Allergies  Allergen Reactions   Zocor [Simvastatin] Other (See Comments)    fatigue   Lipitor [Atorvastatin] Other (See Comments)    Muscle cramping    Social History:   Social History   Socioeconomic History   Marital status: Married    Spouse name: Not on file   Number of children: Not on file   Years of education: Not on file   Highest education level: Not on file  Occupational History   Not on file  Social Needs   Financial resource strain: Not on file   Food insecurity    Worry: Not on file    Inability: Not on file   Transportation needs    Medical: Not on file    Non-medical: Not on file  Tobacco Use   Smoking status: Never Smoker   Smokeless tobacco: Never Used  Substance and Sexual Activity   Alcohol use: No    Alcohol/week: 0.0 standard drinks   Drug use: No   Sexual activity: Never  Lifestyle   Physical activity    Days per week: Not on file    Minutes per session: Not on file   Stress: Not on file  Relationships   Social connections    Talks on phone: Not on file    Gets together: Not on file    Attends  religious service: Not on file    Active member of club or organization: Not on file    Attends meetings of clubs or organizations: Not on file    Relationship status: Not on file    Intimate partner violence    Fear of current or ex partner: Not on file    Emotionally abused: Not on file    Physically abused: Not on file    Forced sexual activity: Not on file  Other Topics Concern   Not on file  Social History Narrative   Not on file    Family History:    Family History  Problem Relation Age of Onset   Heart attack Mother        Age 22     ROS:  Please see the history of present illness.  General:no colds or fevers, no weight changes Skin:no rashes or ulcers HEENT:no blurred vision, no congestion CV:see HPI PUL:see HPI GI:no diarrhea constipation or melena, no indigestion GU:no hematuria, no dysuria. + kidney stones per pt.  MS:no joint pain, no claudication Neuro:no syncope, no lightheadedness Endo:+ diabetes, no thyroid [name]  All other ROS reviewed and negative.     Physical Exam/Data:   Vitals:   12/21/18 1117 12/21/18 1445 12/21/18 2023 12/22/18 0530  BP: 137/66 119/65 (!) 129/53 (!) 131/52  Pulse: 84 (!) 111 (!) 42 63  Resp: 20 18 20 20   Temp:  98.5 F (36.9 C) 98.5 F (36.9 C) 98.3 F (36.8 C)  TempSrc:   Oral Oral  SpO2: 94% 94% 98% 97%  Weight:      Height:        Intake/Output Summary (Last 24 hours) at 12/22/2018 0818 Last data filed at 12/22/2018 0700 Gross per 24 hour  Intake 1313.48 ml  Output 2300 ml  Net -986.52 ml   Last 3 Weights 12/19/2018 12/18/2018 12/18/2018  Weight (lbs) 247 lb 9.2 oz 248 lb 14.4 oz 260 lb  Weight (kg) 112.3 kg 112.9 kg 117.935 kg     Body mass index is 41.2 kg/m.  General:  Well nourished, well developed, in no acute distress HEENT: normal Lymph: no adenopathy Neck: no JVD Endocrine:  No thryomegaly Vascular: No carotid bruits; pedal pulses 1+ bilaterally without bruits  Cardiac:  normal S1, S2; RRR; no murmur gallup rub or click Lungs:  clear to auscultation bilaterally, no wheezing, rhonchi or rales  Abd: obese, soft, nontender, no hepatomegaly  Ext: no edema Musculoskeletal:  No  deformities, BUE and BLE strength normal and equal Skin: warm and dry  Neuro:  Alert and oriented X 3, MAE, follows commands, no focal abnormalities noted Psych:  Normal affect   Relevant CV Studies: Echo 07/21/18 IMPRESSIONS    1. Images are limited.  2. The left ventricle had a visually estimated ejection fraction of approximately 40-45%. Unable to assess focal wall motion. The cavity size was normal. There is moderately increased left ventricular wall thickness. Left ventricular diastolic Doppler  parameters are consistent with impaired relaxation.  3. The cavity was mildly enlarged.  4. The aortic valve is tricuspid. Moderate calcification of the aortic valve. Mild to moderate aortic annular calcification noted. Possible mild aortic stenosis.  5. The mitral valve is grossly normal. There is mild to moderate mitral annular calcification present.  6. The tricuspid valve was grossly normal.  7. The aortic root is normal in size and structure.  FINDINGS  Left Ventricle: The left ventricle has a visually estimated ejection fraction of. The cavity size  was normal. There is moderately increased left ventricular wall thickness. Left ventricular diastolic Doppler parameters are consistent with impaired  relaxation (grade I). Definity contrast agent was given IV to delineate the left ventricular endocardial borders.   Right Ventricle: The cavity was mildly enlarged. Pericardium: There is no evidence of pericardial effusion. Mitral Valve: The mitral valve is grossly normal. There is mild to moderate mitral annular calcification present. Mitral valve regurgitation is trivial by color flow Doppler. Tricuspid Valve: The tricuspid valve was grossly normal. Tricuspid valve regurgitation is trivial by color flow Doppler. Aortic Valve: The aortic valve is tricuspid Moderate calcification of the aortic valve. Aortic valve regurgitation was not visualized by color flow Doppler. Mild to moderate aortic  annular calcification noted. Pulmonic Valve: The pulmonic valve was not well visualized. Pulmonic valve regurgitation is not visualized by color flow Doppler. Aorta: The aortic root is normal in size and structure.   LEFT VENTRICLE PLAX 2D                 Diastology LVIDd:         4.31 cm  LV e' lateral:   3.70 cm/s LVIDs:         3.05 cm  LV E/e' lateral: 14.8 LV PW:         1.84 cm  LV e' medial:    5.87 cm/s LV IVS:        1.30 cm  LV E/e' medial:  9.3 LVOT diam:     2.10 cm LV SV:         47 ml LV SV Index:   19.12 LVOT Area:     3.46 cm  LEFT ATRIUM         Index LA diam:    3.80 cm 1.68 cm/m  AORTIC VALVE AV Area (Vmax):    1.38 cm AV Area (Vmean):   1.12 cm AV Area (VTI):     1.54 cm AV Vmax:           161.00 cm/s AV Vmean:          108.000 cm/s AV VTI:            0.305 m AV Peak Grad:      10.4 mmHg AV Mean Grad:      6.0 mmHg LVOT Vmax:         64.00 cm/s LVOT Vmean:        34.900 cm/s LVOT VTI:          0.136 m LVOT/AV VTI ratio: 0.45   AORTA Ao Root diam: 2.70 cm  MITRAL VALVE MV Area (PHT): 3.31 cm   SHUNTS MV PHT:        66.41 msec Systemic VTI:  0.14 m MV Decel Time: 229 msec   Systemic Diam: 2.10 cm MV E velocity: 54.80 cm/s MV A velocity: 88.80 cm/s MV E/A ratio:  0.62   Cardiac cath 2014 Hemodynamic Findings: Central aortic pressure: 149/63 Left ventricular pressure: 169/9/21  Angiographic Findings:  Left mainstem: Long mid and distal calcified 25% stenosis.   Left anterior descending (LAD): Diffuse proximal vessel [name]. 100% occlusion in the mid vessel. Heavy calcification. The distal vessel fills via the LIMA and appears to be free of high-grade [name]. There is a diagonal that is occluded and no longer fills via the vein graft.   Left circumflex (LCx): There is a ramus intermediate that is moderate-sized. It is branching. There is moderate calcification. There is diffuse 60-70% stenosis in a  somewhat narrow caliber vessel  which is unchanged from last cath.  The proximal AV groove has a stent with mild luminal irregularities but is otherwise widely patent. There is a step off in the caliber of the vessel immediately after the stent with long 50% stenosis leading into a large obtuse marginal. The obtuse marginal is free of high-grade [name].   Right coronary artery (RCA): Occluded at the proximal segment. The distal vessel fills via the sequential vein graft.   Graft Anatomy:  LIMA to LAD: Widely patent  SVG to Diag:  Known to be occluded (as demonstrated in the previous catheterization)  SVG to distal RCA/PL: The proximal stents are widely patent. The mid stented segment in the body of SVG has a 99% hazy stenosis    Left ventriculography: Left ventricular systolic function is normal with LVEF 50-55%, inferoapical hypokinesis.   Impression: 1. Acute inferior STEMI secondary to thrombotic lesion in mid body of SVG to PDA, now s/p successful PTCA/DES x 1 mid body of SVG to PDA 2. Preserved LV systolic function 3. Triple vessel CAD s/p 5V CABG with 3/5 patent bypass grafts  Recommendations: Admit to CCU. ASA and Plavix for lifetime. Will start Crestor. Does not tolerate Zocor or Lipitor.        Complications:  None. The patient tolerated the procedure well.                  Laboratory Data:  High Sensitivity Troponin:   Recent Labs  Lab 12/21/18 1141 12/21/18 1438  TROPONINIHS 322* 582*     Chemistry Recent Labs  Lab 12/20/18 0515 12/21/18 0558 12/22/18 0031  NA 137 137 135  K 3.6 4.0 4.0  CL 99 100 98  CO2 29 29 30   GLUCOSE 211* 175* 136*  BUN 110* 91* 82*  CREATININE 2.31* 1.92* 1.97*  CALCIUM 9.1 9.1 8.8*  GFRNONAA 25* 31* 31*  GFRAA 29* 36* 35*  ANIONGAP 9 8 7     Recent Labs  Lab 12/18/18 1716  PROT 6.9  ALBUMIN 3.7  AST 12*  ALT 10  ALKPHOS 60  BILITOT 0.3   Hematology Recent Labs  Lab 12/18/18 1716 12/19/18 0429 12/22/18 0031  WBC 7.9 7.1 5.7  RBC 4.13* 3.96*  3.88*  HGB 11.8* 11.5* 11.1*  HCT 38.7* 37.3* 36.3*  MCV 93.7 94.2 93.6  MCH 28.6 29.0 28.6  MCHC 30.5 30.8 30.6  RDW 13.8 14.0 13.9  PLT 134* 136* 141*   BNPNo results for input(s): BNP, PROBNP in the last 168 hours.  DDimer No results for input(s): DDIMER in the last 168 hours.   Radiology/Studies:  Ct Head Wo Contrast  Result Date: 12/18/2018 CLINICAL DATA:  85 year old male with fall. EXAM: CT HEAD WITHOUT CONTRAST CT CERVICAL SPINE WITHOUT CONTRAST TECHNIQUE: Multidetector CT imaging of the head and cervical spine was performed following the standard protocol without intravenous contrast. Multiplanar CT image reconstructions of the cervical spine were also generated. COMPARISON:  Head CT dated 02/08/2017 FINDINGS: Evaluation of this exam is limited due to motion artifact. CT HEAD FINDINGS Brain: There is mild age-related atrophy and chronic microvascular ischemic changes. There is no acute intracranial hemorrhage. No mass effect or midline shift. No extra-axial fluid collection. Vascular: No hyperdense vessel or unexpected calcification. Skull: Normal. Negative for fracture or focal lesion. Sinuses/Orbits: No acute finding. Other: None CT CERVICAL SPINE FINDINGS Alignment: No acute subluxation. Skull base and vertebrae: No acute fracture. Soft tissues and spinal canal: No prevertebral fluid or swelling. No  visible canal hematoma. Disc levels:  Multilevel degenerative changes. Upper chest: Negative. Other: Bilateral carotid bulb calcified plaques. IMPRESSION: 1. No acute intracranial hemorrhage. 2. No acute/traumatic cervical spine pathology. Electronically Signed   By: Anner Crete M.D.   On: 12/18/2018 19:04   Ct Cervical Spine Wo Contrast  Result Date: 12/18/2018 CLINICAL DATA:  85 year old male with fall. EXAM: CT HEAD WITHOUT CONTRAST CT CERVICAL SPINE WITHOUT CONTRAST TECHNIQUE: Multidetector CT imaging of the head and cervical spine was performed following the standard protocol  without intravenous contrast. Multiplanar CT image reconstructions of the cervical spine were also generated. COMPARISON:  Head CT dated 02/08/2017 FINDINGS: Evaluation of this exam is limited due to motion artifact. CT HEAD FINDINGS Brain: There is mild age-related atrophy and chronic microvascular ischemic changes. There is no acute intracranial hemorrhage. No mass effect or midline shift. No extra-axial fluid collection. Vascular: No hyperdense vessel or unexpected calcification. Skull: Normal. Negative for fracture or focal lesion. Sinuses/Orbits: No acute finding. Other: None CT CERVICAL SPINE FINDINGS Alignment: No acute subluxation. Skull base and vertebrae: No acute fracture. Soft tissues and spinal canal: No prevertebral fluid or swelling. No visible canal hematoma. Disc levels:  Multilevel degenerative changes. Upper chest: Negative. Other: Bilateral carotid bulb calcified plaques. IMPRESSION: 1. No acute intracranial hemorrhage. 2. No acute/traumatic cervical spine pathology. Electronically Signed   By: Anner Crete M.D.   On: 12/18/2018 19:04   Dg Chest Portable 1 View  Result Date: 12/18/2018 CLINICAL DATA:  AMS EXAM: PORTABLE CHEST 1 VIEW COMPARISON:  Chest radiographs 12/06/2018, 10/22/2018 FINDINGS: Stable cardiomediastinal contours status post median sternotomy. Cardiomegaly. Aortic arch calcification. There is central vascular congestion. There are right basilar opacities favored to represent atelectasis. There is opacification of the left hemidiaphragm which may represent atelectasis, infiltrate not excluded. No pneumothorax. No acute osseous abnormality in the visualized skeleton. IMPRESSION: 1. Right basilar opacities favored to represent atelectasis. 2. Opacification of the left hemidiaphragm may represent atelectasis, infiltrate not excluded. This could be better assessed with PA and lateral radiographs. 3. Cardiomegaly with central venous congestion. Electronically Signed   By: Audie Pinto M.D.   On: 12/18/2018 18:13    Assessment and Plan:   1. Unstable angina with elevated troponin at 582, new T wave inversion in I.  More freq PVCs and 10 beats V. Tach.  Pt now on IV heparin,   His imdur was held for a day now on 60 mg daily  Lopressor held one day but has been back on his usual 50 mg BID.  No further pain.  Would do stress test when improved to rule out ischemia vs follow for recurrent pain.   2. CAD of Native cors and Bypass grafts with 3/5 patent in 2014 and stents in 2 vessels.  3. AKI now improved to 1.97  Though baseline may be 2.3  4. HTN back on meds hydralazine BB and imdur 5. Chronic diastolic HF on demadex and lungs clear.   6. Known statin intolerance       For questions or updates, please contact [place] Please consult [email or web address] for contact info under     Signed, Cecilie Kicks, NP  12/22/2018 8:18 AM   Attending note  Patient seen and discussed with PA Dorene Ar, I agree with her documentation above. 85 yo male history of COPD, CAD with prior CABG in 1994 along with prior stenting as reported above, HTN, HL with statin intolerance, chronic diastolic HF, DM2 admitted with dehydration, encephalopathy, AKI. During admissions episode  of chest pain and mild trop elevation, cardiology consulted  Admission 07/2018 with peak trop 0.91, managed medically by his primary cardiologist Dr [name] with plans to consider cath only if refractory symptosm or declining clinical status.    Admission labs K 3.5 Cr 3.52 BUN 153 WBC 7.9 Hgb 11.8 Plt 134  COVID neg hstrop 322-->582-->   2014 cath: LIMA-LAD patent, SVG-diag chronically occluded, SVG-RCA patent proximal stents and mid stent 99% stenosis. Received PTCA/DES. LAD occluded, RCA occluded, LCX moderate [name] 05/2014 nuclear stress: no ischemia, fixed inferolateral defect 07/2018 echo LVEF 40-45%,    Patient with known CAD complex anatomy with both prior CABG and PCIs with chest pain and  troponin elevation. Management complicated by advanced age, comorbidities, complex coronary anatomy, as well as acute encephalopathy and significant AKI on CKD. He was medically managed for similar presentation 07/2018 by his primary cardiolgist Dr [name] with plans to only consider cath if refractory symptoms or clinical instability. I agree he remains a poor candidate for cath. Would treat medically at this time, follow trop trends. 48 hrs of anticoag, continue DAPT ( has been on long term), lopressor 50mg  bid. No ACE given CKD IV no statin given prior intolerance. If he remains symptom free and clinically stable Im not sure noninvasive risk stratification would change my management at this time, would focuse on clinical progression. To me he reports an isolated episode of mild chest pain yesterday now resolved without recurrence, continue to monitor symptoms.   Short run of NSVT, K is 4. F/u Mg, continue beta blocker   Carlyle Dolly MD

## 2018-12-22 NOTE — Progress Notes (Signed)
Patient refused CPAP again. Unit still at bedside.

## 2018-12-22 NOTE — Progress Notes (Signed)
[place] for Heparin Indication: ACS/STEMI  Allergies  Allergen Reactions  . Zocor [Simvastatin] Other (See Comments)    fatigue  . Lipitor [Atorvastatin] Other (See Comments)    Muscle cramping    Patient Measurements: Height: 5\' 5"  (165.1 cm) Weight: 247 lb 9.2 oz (112.3 kg) IBW/kg (Calculated) : 61.5 Heparin Dosing Weight: 88 kg  Vital Signs: Temp: 98.5 F (36.9 C) (09/09 2023) Temp Source: Oral (09/09 2023) BP: 129/53 (09/09 2023) Pulse Rate: 42 (09/09 2023)  Labs: Recent Labs    12/19/18 0429 12/20/18 0515 12/21/18 0558 12/21/18 1141 12/21/18 1438 12/21/18 1617 12/22/18 0031  HGB 11.5*  --   --   --   --   --  11.1*  HCT 37.3*  --   --   --   --   --  36.3*  PLT 136*  --   --   --   --   --  141*  APTT  --   --   --   --   --  34  --   LABPROT  --   --   --   --   --  14.9  --   INR  --   --   --   --   --  1.2  --   HEPARINUNFRC  --   --   --   --   --   --  0.46  CREATININE 2.81* 2.31* 1.92*  --   --   --  1.97*  TROPONINIHS  --   --   --  322* 582*  --   --     Estimated Creatinine Clearance: 32.9 mL/min (A) (by C-G formula based on SCr of 1.97 mg/dL (H)).   Medical History: Past Medical History:  Diagnosis Date  . Allergic rhinitis   . Anxiety   . Arthritis   . COPD (chronic obstructive pulmonary disease) ([place])   . Coronary atherosclerosis of native coronary artery    a. CABG x 4 in 1994. Previous stent placement to SVG to distal RCA;  b. DES to native Cx in 2006;  c. DES SVG to RCA 05/08/11; d. Inf MI/Cath/PCI: LM 25, LAD 100, LCX patent stent, 50 into OM, RI 60-70, RCA 100, LIMA->LAD nl, VG->Diag 100 old, VG->RCA/PL 99 (DES x 1), EF 50-55%.  . Depression    With anxious features/hx panic attacks  . Essential hypertension, benign   . GERD (gastroesophageal reflux disease)   . Hyperlipidemia    Not on statin 2/2 hx of intolerance.   . Morbid obesity ([place])   . Nephrolithiasis   . Pneumonia   . Sleep  apnea   . ST elevation myocardial infarction (STEMI) of inferior wall ([place])    4/14 - DES SVG to PDA  . Type 2 diabetes mellitus (HCC)     Medications:  Medications Prior to Admission  Medication Sig Dispense Refill Last Dose  . acetaminophen (TYLENOL) 325 MG tablet Take 650 mg by mouth every 4 (four) hours as needed.   12/17/2018  . allopurinol (ZYLOPRIM) 100 MG tablet Take 2 tablets (200 mg total) by mouth daily. 60 tablet 0 12/18/2018 at Unknown time  . alprazolam (XANAX) 2 MG tablet Take 1 tablet by mouth 2 (two) times a day.   12/17/2018  . aspirin EC 81 MG tablet Take 81 mg by mouth every morning.   12/18/2018 at Unknown time  . clopidogrel (PLAVIX) 75 MG tablet TAKE 1  TABLET BY MOUTH EVERY DAY WITH BREAKFAST 90 tablet 1 12/18/2018 at Unknown time  . escitalopram (LEXAPRO) 5 MG tablet Take 1 tablet by mouth daily.   12/18/2018 at Unknown time  . hydrALAZINE (APRESOLINE) 10 MG tablet TAKE 1 TABLET(10 MG) BY MOUTH EVERY 8 HOURS 90 tablet 1 12/18/2018 at Unknown time  . insulin aspart (NOVOLOG) 100 UNIT/ML injection Inject 7 Units into the skin 3 (three) times daily with meals. Give only if eats 50% or more of meal. 10 mL 0 12/18/2018 at Unknown time  . Insulin Glargine, 2 Unit Dial, (TOUJEO MAX SOLOSTAR) 300 UNIT/ML SOPN Inject 60 Units into the skin at bedtime. 15 mL 0 12/17/2018 at Unknown time  . isosorbide mononitrate (IMDUR) 30 MG 24 hr tablet TAKE 1 TABLET(30 MG) BY MOUTH TWICE DAILY 60 tablet 11 12/17/2018 at Unknown time  . metoprolol tartrate (LOPRESSOR) 50 MG tablet TAKE 1 TABLET(50 MG) BY MOUTH TWICE DAILY 60 tablet 11 12/18/2018 at 0900  . nitroGLYCERIN (NITROLINGUAL) 0.4 MG/SPRAY spray Place 1 spray under the tongue every 5 (five) minutes x 3 doses as needed for chest pain. 12 g 0 12/17/2018  . ondansetron (ZOFRAN) 4 MG tablet Take 1 tablet (4 mg total) by mouth every 8 (eight) hours as needed for nausea or vomiting. 10 tablet 0 12/17/2018  . OXYGEN Inhale 2 L/min into the lungs continuous.   12/17/2018   . polyethylene glycol powder (GLYCOLAX/MIRALAX) powder Take 17 g by mouth daily as needed (constipation).    12/17/2018 at Unknown time  . psyllium (METAMUCIL) 58.6 % powder Take 2 tsp by mouth: Mix with 8 ounces liquid and drink once a day   12/17/2018 at Unknown time  . tamsulosin (FLOMAX) 0.4 MG CAPS capsule Take 1 capsule (0.4 mg total) by mouth every evening. 30 capsule 0 12/17/2018 at Unknown time  . torsemide (DEMADEX) 20 MG tablet Take 1 tablet (20 mg total) by mouth daily. Beginning 08/05/2018 30 tablet 1 12/17/2018  . UNABLE TO FIND CPAP from home while sleeping, at previous home settings   12/17/2018    Assessment: Pharmacy consulted to dose heparin in patient with ACS/STEMI-patient with elevated troponin at 582.  Patient is not on anticoagulation prior to admission.  9/10 AM update: initial heparin level therapeutic  Goal of Therapy:  Heparin level 0.3-0.7 units/ml Monitor platelets by anticoagulation protocol: Yes   Plan:  Cont heparin at 1200 units/hr Confirmatory heparin level in 8 hours  Narda Bonds, PharmD, [place] Pharmacist Phone: [phone]

## 2018-12-22 NOTE — Progress Notes (Signed)
PROGRESS NOTE  [name] [id number] DOB: 1933-07-09 DOA: 12/18/2018 PCP: [name], MD Brief History:  85 y.o.malewith medical history significant ofDM; CAD s/p stents; OSA; morbid obesity (BMI 43); HTN; HLD; and COPD presenting with AMS s/p fall.There was no loss of consciousness.  Patient is a poor historian.  Because of the patient's continued confusion, he was brought to emergency department for further evaluation.  He was last admitted from 7/11-14 for LLL PNA and acute on chronic renal failure with d/c creatinine 2.3. ED Course:Weakness, dehydration, markedly elevated BUN (but 100 last admission), CKD, decreased PO intake, not altered. Head CT neg despite recent fall. CXR ok and no respiratory symptoms. Dr. Jonnie Finner suggests observation, IVF, monitoring.       Assessment/Plan: acute on chronic renal failure--CKD3 -appears to be in the setting of dehydration and hypotension -baseline creatinine 1.9-2.2 -serum creatinine peaked 3.52 -AM BMP  acute metabolic encephalopathy  -associated with ongoing dehydration and uremia -CT head negative -CT C-spine neg -Mentation improved -Currently oriented x3 and able to follow commands properly -12/22/2018--mental status at baseline per pt's son at bedside  Chronic combined systolic and diastolic CHF -Stable and compensated -07/21/18  echo demonstrating 40 to 45% ejection fraction -daily weights -resume demadex on 12/21/2018  Chest Pain/CAD/Elevated troponin--Unstable Angina -pt had chest pain am 12/21/18 -check tropoinins--[id number] -Appreciate cardiology consult--> plan to treat medically; continue heparin IV x48 hours; poor candidate for invasive procedure/cardiac catheterization -check EKG--personally reviewed--sinus, nonspecific STT changes -not a candidate for aggressive intervention -restarted hydralazine -increase imdur to 60 mg am; 30 mg pm  chronic respiratory failure with hypoxia -No  wheezing and no complaints of shortness of breath -Continue oxygen supplementation--2.5 L  -Continue PRN BDs  COPD -stable on home 2.5L -no wheezing  Essential hypertension, benign -meds initially held due to soft BP -restarted metoprolol -restarted hydralazine -Continue heart healthy diet.  Obesity, Class III, BMI 40-49.9 (morbid obesity) (HCC) -Body mass index is 41.2 kg/m. -Low calorie diet and portion control discussed with patient.  Hyperlipidemia -Patient has reported allergies to the use of Zocor and Lipitor in the past -Continue heart healthy/low-fat diet -hx of statin intolerance  Type 2 diabetes mellitus with stage 3 chronic kidney disease, with long-term current use of insulin ([place]) -12/18/18 A1C--8.8 -Continue sliding scale insulin and long-acting insulin -add novolog 4 units tiw  Right flank pain -CT abdomen pelvis with renal stone protocol  Sleep apnea -Continue the use of CPAP nightly      Disposition Plan:   SNF 9/11 if stable and cleared by cardiology Family Communication:  No Family at bedside  Consultants:  cardiology  Code Status:DNR  DVT Prophylaxis: [place] Lovenox   Procedures: As Listed in Progress Note Above  Antibiotics: None   Total time spent 35 minutes.  Greater than 50% spent face to face counseling and coordinating care.    Subjective: Patient is more alert today.  He had some right flank pain last evening which was relieved with tramadol.  He states that is better today.  He denies any use, chills, headache, neck pain, chest pain, shortness of breath, nausea, vomiting, diarrhea, domino pain.  There is no dysuria or hematuria.  There is no coughing or hemoptysis.  Objective: Vitals:   12/21/18 1445 12/21/18 2023 12/22/18 0530 12/22/18 1420  BP: 119/65 (!) 129/53 (!) 131/52 (!) 139/51  Pulse: (!) 111 (!) 42 63   Resp: 18 20 20    Temp: 98.5 F (  36.9 C) 98.5 F (36.9 C) 98.3 F (36.8 C)   TempSrc:  Oral  Oral   SpO2: 94% 98% 97%   Weight:      Height:        Intake/Output Summary (Last 24 hours) at 12/22/2018 1645 Last data filed at 12/22/2018 1500 Gross per 24 hour  Intake 1310.48 ml  Output 2900 ml  Net -1589.52 ml   Weight change:  Exam:   General:  Pt is alert, follows commands appropriately, not in acute distress  HEENT: No icterus, No thrush, No neck mass, [place]/AT  Cardiovascular: RRR, S1/S2, no rubs, no gallops  Respiratory: CTA bilaterally, no wheezing, no crackles, no rhonchi  Abdomen: Soft/+BS, non tender, non distended, no guarding  Extremities: No edema, No lymphangitis, No petechiae, No rashes, no synovitis   Data Reviewed: I have personally reviewed following labs and imaging studies Basic Metabolic Panel: Recent Labs  Lab 12/18/18 1716 12/18/18 1740 12/19/18 0429 12/20/18 0515 12/21/18 0558 12/22/18 0031 12/22/18 1028  NA 133* 135 138 137 137 135  --   K 3.5 3.5 3.0* 3.6 4.0 4.0  --   CL 92* 90* 98 99 100 98  --   CO2 30  --  30 29 29 30   --   GLUCOSE 252* 239* 183* 211* 175* 136*  --   BUN 153* >140* 137* 110* 91* 82*  --   CREATININE 3.52* 3.40* 2.81* 2.31* 1.92* 1.97*  --   CALCIUM 8.7*  --  8.9 9.1 9.1 8.8*  --   MG  --   --   --  1.8  --   --  1.7   Liver Function Tests: Recent Labs  Lab 12/18/18 1716  AST 12*  ALT 10  ALKPHOS 60  BILITOT 0.3  PROT 6.9  ALBUMIN 3.7   No results for input(s): LIPASE, AMYLASE in the last 168 hours. Recent Labs  Lab 12/18/18 1717  AMMONIA 24   Coagulation Profile: Recent Labs  Lab 12/21/18 1617  INR 1.2   CBC: Recent Labs  Lab 12/18/18 1716 12/18/18 1740 12/19/18 0429 12/22/18 0031  WBC 7.9  --  7.1 5.7  NEUTROABS 5.8  --   --   --   HGB 11.8* 13.3 11.5* 11.1*  HCT 38.7* 39.0 37.3* 36.3*  MCV 93.7  --  94.2 93.6  PLT 134*  --  136* 141*   Cardiac Enzymes: No results for input(s): CKTOTAL, CKMB, CKMBINDEX, TROPONINI in the last 168 hours. BNP: Invalid input(s):  POCBNP CBG: Recent Labs  Lab 12/21/18 1636 12/21/18 2024 12/22/18 0724 12/22/18 1134 12/22/18 1635  GLUCAP 178* 221* 89 118* 157*   HbA1C: No results for input(s): HGBA1C in the last 72 hours. Urine analysis:    Component Value Date/Time   COLORURINE STRAW (A) 12/18/2018 1820   APPEARANCEUR CLEAR 12/18/2018 1820   LABSPEC 1.010 12/18/2018 1820   PHURINE 5.0 12/18/2018 1820   GLUCOSEU NEGATIVE 12/18/2018 1820   HGBUR NEGATIVE 12/18/2018 1820   BILIRUBINUR NEGATIVE 12/18/2018 1820   KETONESUR NEGATIVE 12/18/2018 1820   PROTEINUR NEGATIVE 12/18/2018 1820   NITRITE NEGATIVE 12/18/2018 1820   LEUKOCYTESUR NEGATIVE 12/18/2018 1820   Sepsis Labs: @LABRCNTIP (procalcitonin:4,lacticidven:4) ) Recent Results (from the past 240 hour(s))  Urine culture     Status: None   Collection Time: 12/18/18  6:20 PM   Specimen: Urine, Clean Catch  Result Value Ref Range Status   Specimen Description   Final    URINE, CLEAN CATCH Performed at [employer] [facility], [address] [place], [place] [address]    Special Requests   Final    NONE Performed at [facility], [address], [place], [place] [address]    Culture   Final    NO GROWTH Performed at [facility], [place] [address], [place], [place] [address]    Report Status 12/19/2018 FINAL  Final  SARS Coronavirus 2 [facility] order, Performed in [facility] hospital lab) Nasopharyngeal Nasopharyngeal Swab     Status: None   Collection Time: 12/18/18  7:30 PM   Specimen: Nasopharyngeal Swab  Result Value Ref Range Status   SARS Coronavirus 2 NEGATIVE NEGATIVE Final    Comment: (NOTE) If result is NEGATIVE SARS-CoV-2 target nucleic acids are NOT DETECTED. The SARS-CoV-2 RNA is generally detectable in upper and lower  respiratory specimens during the acute phase of infection. The lowest  concentration of SARS-CoV-2 viral copies this assay can detect is 250  copies / mL. A negative result does not preclude SARS-CoV-2 infection  and  should not be used as the sole basis for treatment or other  patient management decisions.  A negative result may occur with  improper specimen collection / handling, submission of specimen other  than nasopharyngeal swab, presence of viral mutation(s) within the  areas targeted by this assay, and inadequate number of viral copies  (<250 copies / mL). A negative result must be combined with clinical  observations, patient history, and epidemiological information. If result is POSITIVE SARS-CoV-2 target nucleic acids are DETECTED. The SARS-CoV-2 RNA is generally detectable in upper and lower  respiratory specimens dur ing the acute phase of infection.  Positive  results are indicative of active infection with SARS-CoV-2.  Clinical  correlation with patient history and other diagnostic information is  necessary to determine patient infection status.  Positive results do  not rule out bacterial infection or co-infection with other viruses. If result is PRESUMPTIVE POSTIVE SARS-CoV-2 nucleic acids MAY BE PRESENT.   A presumptive positive result was obtained on the submitted specimen  and confirmed on repeat testing.  While 2019 novel coronavirus  (SARS-CoV-2) nucleic acids may be present in the submitted sample  additional confirmatory testing may be necessary for epidemiological  and / or clinical management purposes  to differentiate between  SARS-CoV-2 and other Sarbecovirus currently known to infect humans.  If clinically indicated additional testing with an alternate test  methodology [phone]) is advised. The SARS-CoV-2 RNA is generally  detectable in upper and lower respiratory sp ecimens during the acute  phase of infection. The expected result is Negative. Fact Sheet for Patients:  StrictlyIdeas.no Fact Sheet for Healthcare Providers: BankingDealers.co.za This test is not yet approved or cleared by the [place] FDA and has been  authorized for detection and/or diagnosis of SARS-CoV-2 by FDA under an Emergency Use Authorization (EUA).  This EUA will remain in effect (meaning this test can be used) for the duration of the COVID-19 declaration under Section 564(b)(1) of the Act, 21 U.S.C. section 360bbb-3(b)(1), unless the authorization is terminated or revoked sooner. Performed at [facility], [address], [place], [place] [address]   MRSA PCR Screening     Status: Abnormal   Collection Time: 12/18/18 10:47 PM   Specimen: Nasal Mucosa; Nasopharyngeal  Result Value Ref Range Status   MRSA by PCR POSITIVE (A) NEGATIVE Final    Comment:        The GeneXpert MRSA Assay (FDA approved for NASAL specimens only), is one component of  a comprehensive MRSA colonization surveillance program. It is not intended to diagnose MRSA infection nor to guide or monitor treatment for MRSA infections. RESULT CALLED TO, READ BACK BY AND VERIFIED WITH: AMBURN,A @ 0324 ON 12/19/18 BY JUW Performed at [facility], [address], [place], [place] [address]      Scheduled Meds:  allopurinol  200 mg Oral Daily   aspirin EC  81 mg Oral q morning - 10a   Chlorhexidine Gluconate Cloth  6 each Topical Daily   citalopram  10 mg Oral Daily   clopidogrel  75 mg Oral Daily   hydrALAZINE  10 mg Oral Q8H   insulin aspart  0-15 Units Subcutaneous TID WC   insulin aspart  0-5 Units Subcutaneous QHS   insulin aspart  4 Units Subcutaneous TID WC   insulin glargine  60 Units Subcutaneous QHS   isosorbide mononitrate  30 mg Oral QHS   isosorbide mononitrate  60 mg Oral Daily   mouth rinse  15 mL Mouth Rinse BID   metoprolol tartrate  50 mg Oral BID   mupirocin ointment  1 application Nasal BID   psyllium  1 packet Oral Daily   sodium chloride flush  3 mL Intravenous Q12H   tamsulosin  0.4 mg Oral QPM   torsemide  20 mg Oral Daily   Continuous Infusions:  heparin 1,200 Units/hr (12/22/18 0313)   lactated ringers 75  mL/hr at 12/22/18 [id number]    Procedures/Studies: Ct Head Wo Contrast  Result Date: 12/18/2018 CLINICAL DATA:  85 year old male with fall. EXAM: CT HEAD WITHOUT CONTRAST CT CERVICAL SPINE WITHOUT CONTRAST TECHNIQUE: Multidetector CT imaging of the head and cervical spine was performed following the standard protocol without intravenous contrast. Multiplanar CT image reconstructions of the cervical spine were also generated. COMPARISON:  Head CT dated 02/08/2017 FINDINGS: Evaluation of this exam is limited due to motion artifact. CT HEAD FINDINGS Brain: There is mild age-related atrophy and chronic microvascular ischemic changes. There is no acute intracranial hemorrhage. No mass effect or midline shift. No extra-axial fluid collection. Vascular: No hyperdense vessel or unexpected calcification. Skull: Normal. Negative for fracture or focal lesion. Sinuses/Orbits: No acute finding. Other: None CT CERVICAL SPINE FINDINGS Alignment: No acute subluxation. Skull base and vertebrae: No acute fracture. Soft tissues and spinal canal: No prevertebral fluid or swelling. No visible canal hematoma. Disc levels:  Multilevel degenerative changes. Upper chest: Negative. Other: Bilateral carotid bulb calcified plaques. IMPRESSION: 1. No acute intracranial hemorrhage. 2. No acute/traumatic cervical spine pathology. Electronically Signed   By: Anner Crete M.D.   On: 12/18/2018 19:04   Ct Cervical Spine Wo Contrast  Result Date: 12/18/2018 CLINICAL DATA:  85 year old male with fall. EXAM: CT HEAD WITHOUT CONTRAST CT CERVICAL SPINE WITHOUT CONTRAST TECHNIQUE: Multidetector CT imaging of the head and cervical spine was performed following the standard protocol without intravenous contrast. Multiplanar CT image reconstructions of the cervical spine were also generated. COMPARISON:  Head CT dated 02/08/2017 FINDINGS: Evaluation of this exam is limited due to motion artifact. CT HEAD FINDINGS Brain: There is mild age-related  atrophy and chronic microvascular ischemic changes. There is no acute intracranial hemorrhage. No mass effect or midline shift. No extra-axial fluid collection. Vascular: No hyperdense vessel or unexpected calcification. Skull: Normal. Negative for fracture or focal lesion. Sinuses/Orbits: No acute finding. Other: None CT CERVICAL SPINE FINDINGS Alignment: No acute subluxation. Skull base and vertebrae: No acute fracture. Soft tissues and spinal canal: No prevertebral fluid or swelling. No visible canal  hematoma. Disc levels:  Multilevel degenerative changes. Upper chest: Negative. Other: Bilateral carotid bulb calcified plaques. IMPRESSION: 1. No acute intracranial hemorrhage. 2. No acute/traumatic cervical spine pathology. Electronically Signed   By: Anner Crete M.D.   On: 12/18/2018 19:04   Dg Chest Portable 1 View  Result Date: 12/18/2018 CLINICAL DATA:  AMS EXAM: PORTABLE CHEST 1 VIEW COMPARISON:  Chest radiographs 12/06/2018, 10/22/2018 FINDINGS: Stable cardiomediastinal contours status post median sternotomy. Cardiomegaly. Aortic arch calcification. There is central vascular congestion. There are right basilar opacities favored to represent atelectasis. There is opacification of the left hemidiaphragm which may represent atelectasis, infiltrate not excluded. No pneumothorax. No acute osseous abnormality in the visualized skeleton. IMPRESSION: 1. Right basilar opacities favored to represent atelectasis. 2. Opacification of the left hemidiaphragm may represent atelectasis, infiltrate not excluded. This could be better assessed with PA and lateral radiographs. 3. Cardiomegaly with central venous congestion. Electronically Signed   By: Audie Pinto M.D.   On: 12/18/2018 18:13    Orson Eva, DO  [facility] Pager [phone]  If 7PM-7AM, please contact night-coverage [email or web address] Password [email or web address] 12/22/2018, 4:45 PM   LOS: 3 days

## 2018-12-22 NOTE — Progress Notes (Signed)
[place] for Heparin Indication: ACS/STEMI  Allergies  Allergen Reactions  . Zocor [Simvastatin] Other (See Comments)    fatigue  . Lipitor [Atorvastatin] Other (See Comments)    Muscle cramping    Patient Measurements: Height: 5\' 5"  (165.1 cm) Weight: 247 lb 9.2 oz (112.3 kg) IBW/kg (Calculated) : 61.5 Heparin Dosing Weight: 88 kg  Vital Signs: Temp: 98.3 F (36.8 C) (09/10 0530) Temp Source: Oral (09/10 0530) BP: 131/52 (09/10 0530) Pulse Rate: 63 (09/10 0530)  Labs: Recent Labs    12/20/18 0515 12/21/18 0558 12/21/18 1141 12/21/18 1438 12/21/18 1617 12/22/18 0031 12/22/18 0821  HGB  --   --   --   --   --  11.1*  --   HCT  --   --   --   --   --  36.3*  --   PLT  --   --   --   --   --  141*  --   APTT  --   --   --   --  34  --   --   LABPROT  --   --   --   --  14.9  --   --   INR  --   --   --   --  1.2  --   --   HEPARINUNFRC  --   --   --   --   --  0.46 0.53  CREATININE 2.31* 1.92*  --   --   --  1.97*  --   TROPONINIHS  --   --  322* 582*  --   --   --     Estimated Creatinine Clearance: 32.9 mL/min (A) (by C-G formula based on SCr of 1.97 mg/dL (H)).   Medical History: Past Medical History:  Diagnosis Date  . Allergic rhinitis   . Anxiety   . Arthritis   . COPD (chronic obstructive pulmonary disease) ([place])   . Coronary atherosclerosis of native coronary artery    a. CABG x 4 in 1994. Previous stent placement to SVG to distal RCA;  b. DES to native Cx in 2006;  c. DES SVG to RCA 05/08/11; d. Inf MI/Cath/PCI: LM 25, LAD 100, LCX patent stent, 50 into OM, RI 60-70, RCA 100, LIMA->LAD nl, VG->Diag 100 old, VG->RCA/PL 99 (DES x 1), EF 50-55%.  . Depression    With anxious features/hx panic attacks  . Essential hypertension, benign   . GERD (gastroesophageal reflux disease)   . Hyperlipidemia    Not on statin 2/2 hx of intolerance.   . Morbid obesity ([place])   . Nephrolithiasis   . Pneumonia   . Sleep apnea    . ST elevation myocardial infarction (STEMI) of inferior wall ([place])    4/14 - DES SVG to PDA  . Type 2 diabetes mellitus (HCC)     Medications:  Medications Prior to Admission  Medication Sig Dispense Refill Last Dose  . acetaminophen (TYLENOL) 325 MG tablet Take 650 mg by mouth every 4 (four) hours as needed.   12/17/2018  . allopurinol (ZYLOPRIM) 100 MG tablet Take 2 tablets (200 mg total) by mouth daily. 60 tablet 0 12/18/2018 at Unknown time  . alprazolam (XANAX) 2 MG tablet Take 1 tablet by mouth 2 (two) times a day.   12/17/2018  . aspirin EC 81 MG tablet Take 81 mg by mouth every morning.   12/18/2018 at Unknown time  . clopidogrel (  PLAVIX) 75 MG tablet TAKE 1 TABLET BY MOUTH EVERY DAY WITH BREAKFAST 90 tablet 1 12/18/2018 at Unknown time  . escitalopram (LEXAPRO) 5 MG tablet Take 1 tablet by mouth daily.   12/18/2018 at Unknown time  . hydrALAZINE (APRESOLINE) 10 MG tablet TAKE 1 TABLET(10 MG) BY MOUTH EVERY 8 HOURS 90 tablet 1 12/18/2018 at Unknown time  . insulin aspart (NOVOLOG) 100 UNIT/ML injection Inject 7 Units into the skin 3 (three) times daily with meals. Give only if eats 50% or more of meal. 10 mL 0 12/18/2018 at Unknown time  . Insulin Glargine, 2 Unit Dial, (TOUJEO MAX SOLOSTAR) 300 UNIT/ML SOPN Inject 60 Units into the skin at bedtime. 15 mL 0 12/17/2018 at Unknown time  . isosorbide mononitrate (IMDUR) 30 MG 24 hr tablet TAKE 1 TABLET(30 MG) BY MOUTH TWICE DAILY 60 tablet 11 12/17/2018 at Unknown time  . metoprolol tartrate (LOPRESSOR) 50 MG tablet TAKE 1 TABLET(50 MG) BY MOUTH TWICE DAILY 60 tablet 11 12/18/2018 at 0900  . nitroGLYCERIN (NITROLINGUAL) 0.4 MG/SPRAY spray Place 1 spray under the tongue every 5 (five) minutes x 3 doses as needed for chest pain. 12 g 0 12/17/2018  . ondansetron (ZOFRAN) 4 MG tablet Take 1 tablet (4 mg total) by mouth every 8 (eight) hours as needed for nausea or vomiting. 10 tablet 0 12/17/2018  . OXYGEN Inhale 2 L/min into the lungs continuous.   12/17/2018  .  polyethylene glycol powder (GLYCOLAX/MIRALAX) powder Take 17 g by mouth daily as needed (constipation).    12/17/2018 at Unknown time  . psyllium (METAMUCIL) 58.6 % powder Take 2 tsp by mouth: Mix with 8 ounces liquid and drink once a day   12/17/2018 at Unknown time  . tamsulosin (FLOMAX) 0.4 MG CAPS capsule Take 1 capsule (0.4 mg total) by mouth every evening. 30 capsule 0 12/17/2018 at Unknown time  . torsemide (DEMADEX) 20 MG tablet Take 1 tablet (20 mg total) by mouth daily. Beginning 08/05/2018 30 tablet 1 12/17/2018  . UNABLE TO FIND CPAP from home while sleeping, at previous home settings   12/17/2018    Assessment: Pharmacy consulted to dose heparin in patient with ACS/STEMI-patient with elevated troponin at 582.  Patient is not on anticoagulation prior to admission.  Heparin level therapeutic: 0.53  Goal of Therapy:  Heparin level 0.3-0.7 units/ml Monitor platelets by anticoagulation protocol: Yes   Plan:  Cont heparin at 1200 units/hr Heparin level daily  Margot Ables, PharmD Clinical Pharmacist 12/22/2018 9:53 AM

## 2018-12-22 NOTE — Progress Notes (Signed)
Patient decided to wear CPAP. Patient placed on unit at previous settings.

## 2018-12-23 DIAGNOSIS — E860 Dehydration: Secondary | ICD-10-CM

## 2018-12-23 LAB — BASIC METABOLIC PANEL
Anion gap: 10 (ref 5–15)
BUN: 73 mg/dL — ABNORMAL HIGH (ref 8–23)
CO2: 29 mmol/L (ref 22–32)
Calcium: 9 mg/dL (ref 8.9–10.3)
Chloride: 97 mmol/L — ABNORMAL LOW (ref 98–111)
Creatinine, Ser: 2 mg/dL — ABNORMAL HIGH (ref 0.61–1.24)
GFR calc Af Amer: 35 mL/min — ABNORMAL LOW (ref >60)
GFR calc non Af Amer: 30 mL/min — ABNORMAL LOW (ref >60)
Glucose, Bld: 161 mg/dL — ABNORMAL HIGH (ref 70–99)
Potassium: 4 mmol/L (ref 3.5–5.1)
Sodium: 136 mmol/L (ref 135–145)

## 2018-12-23 LAB — URINALYSIS, COMPLETE (UACMP) WITH MICROSCOPIC
Bacteria, UA: NONE SEEN
Bilirubin Urine: NEGATIVE
Glucose, UA: NEGATIVE mg/dL
Hgb urine dipstick: NEGATIVE
Ketones, ur: NEGATIVE mg/dL
Leukocytes,Ua: NEGATIVE
Nitrite: NEGATIVE
Protein, ur: NEGATIVE mg/dL
Specific Gravity, Urine: 1.009 (ref 1.005–1.030)
pH: 5 (ref 5.0–8.0)

## 2018-12-23 LAB — HEPATIC FUNCTION PANEL
ALT: 14 U/L (ref 0–44)
AST: 16 U/L (ref 15–41)
Albumin: 3.2 g/dL — ABNORMAL LOW (ref 3.5–5.0)
Alkaline Phosphatase: 53 U/L (ref 38–126)
Bilirubin, Direct: 0.1 mg/dL (ref 0.0–0.2)
Indirect Bilirubin: 0.3 mg/dL (ref 0.3–0.9)
Total Bilirubin: 0.4 mg/dL (ref 0.3–1.2)
Total Protein: 6.5 g/dL (ref 6.5–8.1)

## 2018-12-23 LAB — CBC
HCT: 36.1 % — ABNORMAL LOW (ref 39.0–52.0)
Hemoglobin: 11.1 g/dL — ABNORMAL LOW (ref 13.0–17.0)
MCH: 28.9 pg (ref 26.0–34.0)
MCHC: 30.7 g/dL (ref 30.0–36.0)
MCV: 94 fL (ref 80.0–100.0)
Platelets: 139 10*3/uL — ABNORMAL LOW (ref 150–400)
RBC: 3.84 MIL/uL — ABNORMAL LOW (ref 4.22–5.81)
RDW: 14.1 % (ref 11.5–15.5)
WBC: 6.1 10*3/uL (ref 4.0–10.5)
nRBC: 0 % (ref 0.0–0.2)

## 2018-12-23 LAB — GLUCOSE, CAPILLARY
Glucose-Capillary: 167 mg/dL — ABNORMAL HIGH (ref 70–99)
Glucose-Capillary: 159 mg/dL — ABNORMAL HIGH (ref 70–99)
Glucose-Capillary: 149 mg/dL — ABNORMAL HIGH (ref 70–99)
Glucose-Capillary: 107 mg/dL — ABNORMAL HIGH (ref 70–99)
Glucose-Capillary: 134 mg/dL — ABNORMAL HIGH (ref 70–99)

## 2018-12-23 LAB — HEPARIN LEVEL (UNFRACTIONATED): Heparin Unfractionated: 0.23 IU/mL — ABNORMAL LOW (ref 0.30–0.70)

## 2018-12-23 LAB — LIPASE, BLOOD: Lipase: 34 U/L (ref 11–51)

## 2018-12-23 MED ORDER — PANTOPRAZOLE SODIUM 40 MG PO TBEC
40.0000 mg | DELAYED_RELEASE_TABLET | Freq: Every day | ORAL | Status: DC
  Administered 2018-12-23 – 2018-12-24 (×2): 40 mg via ORAL
  Filled 2018-12-23 (×2): qty 1

## 2018-12-23 MED ORDER — TORSEMIDE 20 MG PO TABS
20.0000 mg | ORAL_TABLET | Freq: Once | ORAL | Status: AC
  Administered 2018-12-23: 20 mg via ORAL
  Filled 2018-12-23: qty 1

## 2018-12-23 MED ORDER — METOCLOPRAMIDE HCL 5 MG/5ML PO SOLN: 10.0000 mg | Freq: Four times a day (QID) | ORAL | Status: DC | PRN

## 2018-12-23 MED ORDER — HEPARIN SODIUM (PORCINE) 5000 UNIT/ML IJ SOLN
5000.0000 [IU] | Freq: Three times a day (TID) | INTRAMUSCULAR | Status: DC
  Administered 2018-12-23 – 2018-12-24 (×3): 5000 [IU] via SUBCUTANEOUS
  Filled 2018-12-23 (×3): qty 1

## 2018-12-23 MED ORDER — ONDANSETRON HCL 4 MG/2ML IJ SOLN
4.0000 mg | Freq: Four times a day (QID) | INTRAMUSCULAR | Status: DC
  Administered 2018-12-23 – 2018-12-24 (×5): 4 mg via INTRAVENOUS
  Filled 2018-12-23 (×5): qty 2

## 2018-12-23 NOTE — Progress Notes (Signed)
[place] for Heparin Indication: ACS/STEMI  Allergies  Allergen Reactions  . Zocor [Simvastatin] Other (See Comments)    fatigue  . Lipitor [Atorvastatin] Other (See Comments)    Muscle cramping    Patient Measurements: Height: 5\' 5"  (165.1 cm) Weight: 247 lb 9.2 oz (112.3 kg) IBW/kg (Calculated) : 61.5 Heparin Dosing Weight: 88 kg  Vital Signs: Temp: 98 F (36.7 C) (09/11 0450) Temp Source: Oral (09/10 2138) BP: 158/55 (09/11 0450) Pulse Rate: 71 (09/11 0450)  Labs: Recent Labs    12/21/18 0558  12/21/18 1438 12/21/18 1617 12/22/18 0031 12/22/18 0821 12/22/18 1028 12/22/18 1350 12/23/18 0430  HGB  --   --   --   --  11.1*  --   --   --  11.1*  HCT  --   --   --   --  36.3*  --   --   --  36.1*  PLT  --   --   --   --  141*  --   --   --  139*  APTT  --   --   --  34  --   --   --   --   --   LABPROT  --   --   --  14.9  --   --   --   --   --   INR  --   --   --  1.2  --   --   --   --   --   HEPARINUNFRC  --   --   --   --  0.46 0.53  --   --  0.23*  CREATININE 1.92*  --   --   --  1.97*  --   --   --  2.00*  TROPONINIHS  --    < > 582*  --   --   --  557* 379*  --    < > = values in this interval not displayed.    Estimated Creatinine Clearance: 32.4 mL/min (A) (by C-G formula based on SCr of 2 mg/dL (H)).    Assessment: Pharmacy consulted to dose heparin in patient with ACS/STEMI-patient with elevated troponin at 582.  Patient wasn't on anticoagulation prior to admission.  9-11 AM Heparin level sub- therapeutic: 0.23 IU/mL   Goal of Therapy:  Heparin level 0.3-0.7 units/ml Monitor platelets by anticoagulation protocol: Yes   Plan:  Increase heparin  to  1250 units/hr Heparin level daily Monitor CBC  Despina Pole, Pharm. D. Clinical Pharmacist 12/23/2018 8:10 AM

## 2018-12-23 NOTE — Progress Notes (Signed)
PROGRESS NOTE  Raed Arispe Schuler [id number] DOB: 03-20-1934 DOA: 12/18/2018 PCP: [name], MD  Brief History: 85 y.o.malewith medical history significant ofDM; CAD s/p stents; OSA; morbid obesity (BMI 43); HTN; HLD; and COPD presenting with AMS s/p fall.There was no loss of consciousness.  Patient is a poor historian.  Because of the patient's continued confusion, he was brought to emergency department for further evaluation.He was last admitted from 7/11-14 for LLL PNA and acute on chronic renal failure with d/c creatinine 2.3. ED Course:Weakness, dehydration, markedly elevated BUN (but 100 last admission), CKD, decreased PO intake, not altered. Head CTnegdespite recent fall. CXR ok and no respiratory symptoms. Dr. Jonnie Finner suggests observation, IVF, monitoring.     Assessment/Plan: acute on chronic renal failure--CKD3 -appears to be in the setting of dehydration and hypotension -baseline creatinine 1.9-2.2 -serum creatinine peaked 3.52 -AM BMP  acute metabolic encephalopathy -associated with ongoing dehydration and uremia -CT head negative -CT C-spine neg -Mentation improved -Currently oriented x3 and able to follow commands properly -12/22/2018--mental status at baseline per pt's son at bedside  Chronic combined systolic and diastolic CHF -Stable and compensated -4/9/20echo demonstrating 40 to 45% ejection fraction -daily weights -resume demadex on 12/21/2018  Chest Pain/CAD/Elevated troponin--Unstable Angina -pt had chest pain am 12/21/18 -check tropoinins--322>>>582>>557 -Appreciate cardiology consult--> plan to treat medically; continue heparin IV x48 hours; poor candidate for invasive procedure/cardiac catheterization--finished ~1700 on 9/11 -check EKG--personally reviewed--sinus, nonspecific STT changes -not a candidate for aggressive intervention -restarted hydralazine -increase imdur to 60 mg am; 30 mg pm  chronic respiratory  failure with hypoxia -No wheezing and no complaints of shortness of breath -Continue oxygen supplementation--2.5 L -Continue PRNBDs  COPD -stable on home 2.5L -no wheezing  Essential hypertension, benign -meds initially held due to soft BP -restarted metoprolol -restarted hydralazine -Continue heart healthy diet.  Obesity, Class III, BMI 40-49.9 (morbid obesity) (HCC) -Body mass index is 41.2 kg/m. -Low calorie diet and portion control discussed with patient.  Hyperlipidemia -Patient has reported allergies to the use of Zocor and Lipitor in the past -Continue heart healthy/low-fat diet -hx of statin intolerance  Type 2 diabetes mellitus with stage 3 chronic kidney disease, with long-term current use of insulin ([place]) -12/18/18 A1C--8.8 -Continue sliding scale insulin and long-acting insulin -add novolog 4 units tiw  Right flank pain -CT abdomen pelvis with renal stone protocol  Sleep apnea -Continue the use of CPAP nightly  Nausea -multifactorial including acute medical illness, a degree of gastroparesis -start PPI -zofran around the clock -LFTs and lipase normal      Disposition Plan:SNF 9/12 if stable Family Communication:NoFamily at bedside  Consultants:cardiology  Code Status:DNR  DVT Prophylaxis: [place] Lovenox   Procedures: As Listed in Progress Note Above  Antibiotics: None      Subjective: Pt complains of nausea.  Denies f/c, cp, sob, abd pain, vomiting, diarrhea, dysuria.  No flank pain  Objective: Vitals:   12/22/18 1950 12/22/18 2138 12/23/18 0450 12/23/18 1449  BP:  (!) 179/80 (!) 158/55 (!) 146/73  Pulse:  72 71 67  Resp:  19 18 18   Temp:  98.5 F (36.9 C) 98 F (36.7 C) 98.2 F (36.8 C)  TempSrc:  Oral  Oral  SpO2: 93% 95% 92% 94%  Weight:      Height:        Intake/Output Summary (Last 24 hours) at 12/23/2018 1719 Last data filed at 12/23/2018 0900 Gross per 24 hour  Intake 240  ml  Output  1350 ml  Net -1110 ml   Weight change:  Exam:   General:  Pt is alert, follows commands appropriately, not in acute distress  HEENT: No icterus, No thrush, No neck mass, [place]/AT  Cardiovascular: RRR, S1/S2, no rubs, no gallops  Respiratory: bibsailar crackles  Abdomen: Soft/+BS, non tender, non distended, no guarding  Extremities: trace LE edema, No lymphangitis, No petechiae, No rashes, no synovitis   Data Reviewed: I have personally reviewed following labs and imaging studies Basic Metabolic Panel: Recent Labs  Lab 12/19/18 0429 12/20/18 0515 12/21/18 0558 12/22/18 0031 12/22/18 1028 12/23/18 0430  NA 138 137 137 135  --  136  K 3.0* 3.6 4.0 4.0  --  4.0  CL 98 99 100 98  --  97*  CO2 30 29 29 30   --  29  GLUCOSE 183* 211* 175* 136*  --  161*  BUN 137* 110* 91* 82*  --  73*  CREATININE 2.81* 2.31* 1.92* 1.97*  --  2.00*  CALCIUM 8.9 9.1 9.1 8.8*  --  9.0  MG  --  1.8  --   --  1.7  --    Liver Function Tests: Recent Labs  Lab 12/18/18 1716 12/23/18 1539  AST 12* 16  ALT 10 14  ALKPHOS 60 53  BILITOT 0.3 0.4  PROT 6.9 6.5  ALBUMIN 3.7 3.2*   Recent Labs  Lab 12/23/18 1539  LIPASE 34   Recent Labs  Lab 12/18/18 1717  AMMONIA 24   Coagulation Profile: Recent Labs  Lab 12/21/18 1617  INR 1.2   CBC: Recent Labs  Lab 12/18/18 1716 12/18/18 1740 12/19/18 0429 12/22/18 0031 12/23/18 0430  WBC 7.9  --  7.1 5.7 6.1  NEUTROABS 5.8  --   --   --   --   HGB 11.8* 13.3 11.5* 11.1* 11.1*  HCT 38.7* 39.0 37.3* 36.3* 36.1*  MCV 93.7  --  94.2 93.6 94.0  PLT 134*  --  136* 141* 139*   Cardiac Enzymes: No results for input(s): CKTOTAL, CKMB, CKMBINDEX, TROPONINI in the last 168 hours. BNP: Invalid input(s): POCBNP CBG: Recent Labs  Lab 12/22/18 1635 12/22/18 2159 12/23/18 0729 12/23/18 1140 12/23/18 1619  GLUCAP 157* 182* 167* 159* 149*   HbA1C: No results for input(s): HGBA1C in the last 72 hours. Urine analysis:    Component Value  Date/Time   COLORURINE STRAW (A) 12/18/2018 1820   APPEARANCEUR CLEAR 12/18/2018 1820   LABSPEC 1.010 12/18/2018 1820   PHURINE 5.0 12/18/2018 1820   GLUCOSEU NEGATIVE 12/18/2018 1820   HGBUR NEGATIVE 12/18/2018 1820   BILIRUBINUR NEGATIVE 12/18/2018 1820   KETONESUR NEGATIVE 12/18/2018 1820   PROTEINUR NEGATIVE 12/18/2018 1820   NITRITE NEGATIVE 12/18/2018 1820   LEUKOCYTESUR NEGATIVE 12/18/2018 1820   Sepsis Labs: @LABRCNTIP (procalcitonin:4,lacticidven:4) ) Recent Results (from the past 240 hour(s))  Urine culture     Status: None   Collection Time: 12/18/18  6:20 PM   Specimen: Urine, Clean Catch  Result Value Ref Range Status   Specimen Description   Final    URINE, CLEAN CATCH Performed at [facility], [address] [place], [place] [address]    Special Requests   Final    NONE Performed at [facility], [address], [place], [place] [address]    Culture   Final    NO GROWTH Performed at [facility], [place] [address], [place], [place] [address]    Report Status 12/19/2018 FINAL  Final  SARS Coronavirus 2 [facility] order, Performed in [facility] hospital lab) Nasopharyngeal Nasopharyngeal Swab     Status: None   Collection Time: 12/18/18  7:30 PM   Specimen: Nasopharyngeal Swab  Result Value Ref Range Status   SARS Coronavirus 2 NEGATIVE NEGATIVE Final    Comment: (NOTE) If result is NEGATIVE SARS-CoV-2 target nucleic acids are NOT DETECTED. The SARS-CoV-2 RNA is generally detectable in upper and lower  respiratory specimens during the acute phase of infection. The lowest  concentration of SARS-CoV-2 viral copies this assay can detect is 250  copies / mL. A negative result does not preclude SARS-CoV-2 infection  and should not be used as the sole basis for treatment or other  patient management decisions.  A negative result may occur with  improper specimen collection / handling, submission of specimen other  than nasopharyngeal swab, presence of viral  mutation(s) within the  areas targeted by this assay, and inadequate number of viral copies  (<250 copies / mL). A negative result must be combined with clinical  observations, patient history, and epidemiological information. If result is POSITIVE SARS-CoV-2 target nucleic acids are DETECTED. The SARS-CoV-2 RNA is generally detectable in upper and lower  respiratory specimens dur ing the acute phase of infection.  Positive  results are indicative of active infection with SARS-CoV-2.  Clinical  correlation with patient history and other diagnostic information is  necessary to determine patient infection status.  Positive results do  not rule out bacterial infection or co-infection with other viruses. If result is PRESUMPTIVE POSTIVE SARS-CoV-2 nucleic acids MAY BE PRESENT.   A presumptive positive result was obtained on the submitted specimen  and confirmed on repeat testing.  While 2019 novel coronavirus  (SARS-CoV-2) nucleic acids may be present in the submitted sample  additional confirmatory testing may be necessary for epidemiological  and / or clinical management purposes  to differentiate between  SARS-CoV-2 and other Sarbecovirus currently known to infect humans.  If clinically indicated additional testing with an alternate test  methodology [phone]) is advised. The SARS-CoV-2 RNA is generally  detectable in upper and lower respiratory sp ecimens during the acute  phase of infection. The expected result is Negative. Fact Sheet for Patients:  StrictlyIdeas.no Fact Sheet for Healthcare Providers: BankingDealers.co.za This test is not yet approved or cleared by the [place] FDA and has been authorized for detection and/or diagnosis of SARS-CoV-2 by FDA under an Emergency Use Authorization (EUA).  This EUA will remain in effect (meaning this test can be used) for the duration of the COVID-19 declaration under Section 564(b)(1)  of the Act, 21 U.S.C. section 360bbb-3(b)(1), unless the authorization is terminated or revoked sooner. Performed at [facility], [address], [place], [place] [address]   MRSA PCR Screening     Status: Abnormal   Collection Time: 12/18/18 10:47 PM   Specimen: Nasal Mucosa; Nasopharyngeal  Result Value Ref Range Status   MRSA by PCR POSITIVE (A) NEGATIVE Final    Comment:        The GeneXpert MRSA Assay (FDA approved for NASAL specimens only), is one component of a comprehensive MRSA colonization surveillance program. It is not intended to diagnose MRSA infection nor to guide or monitor treatment for MRSA infections. RESULT CALLED TO, READ BACK BY AND VERIFIED WITH: AMBURN,A @ [id number] ON 12/19/18 BY JUW Performed at [facility], [address], [place], [place] [address]      Scheduled Meds:  allopurinol  200 mg Oral Daily  aspirin EC  81 mg Oral q morning - 10a   Chlorhexidine Gluconate Cloth  6 each Topical Daily   citalopram  10 mg Oral Daily   clopidogrel  75 mg Oral Daily   heparin injection (subcutaneous)  5,000 Units Subcutaneous Q8H   hydrALAZINE  10 mg Oral Q8H   insulin aspart  0-15 Units Subcutaneous TID WC   insulin aspart  0-5 Units Subcutaneous QHS   insulin aspart  4 Units Subcutaneous TID WC   insulin glargine  60 Units Subcutaneous QHS   isosorbide mononitrate  30 mg Oral QHS   isosorbide mononitrate  60 mg Oral Daily   mouth rinse  15 mL Mouth Rinse BID   metoprolol tartrate  50 mg Oral BID   mupirocin ointment  1 application Nasal BID   ondansetron (ZOFRAN) IV  4 mg Intravenous Q6H   pantoprazole  40 mg Oral Daily   psyllium  1 packet Oral Daily   sodium chloride flush  3 mL Intravenous Q12H   tamsulosin  0.4 mg Oral QPM   torsemide  20 mg Oral Daily   Continuous Infusions:  Procedures/Studies: Ct Head Wo Contrast  Result Date: 12/18/2018 CLINICAL DATA:  85 year old male with fall. EXAM: CT HEAD WITHOUT CONTRAST CT CERVICAL  SPINE WITHOUT CONTRAST TECHNIQUE: Multidetector CT imaging of the head and cervical spine was performed following the standard protocol without intravenous contrast. Multiplanar CT image reconstructions of the cervical spine were also generated. COMPARISON:  Head CT dated 02/08/2017 FINDINGS: Evaluation of this exam is limited due to motion artifact. CT HEAD FINDINGS Brain: There is mild age-related atrophy and chronic microvascular ischemic changes. There is no acute intracranial hemorrhage. No mass effect or midline shift. No extra-axial fluid collection. Vascular: No hyperdense vessel or unexpected calcification. Skull: Normal. Negative for fracture or focal lesion. Sinuses/Orbits: No acute finding. Other: None CT CERVICAL SPINE FINDINGS Alignment: No acute subluxation. Skull base and vertebrae: No acute fracture. Soft tissues and spinal canal: No prevertebral fluid or swelling. No visible canal hematoma. Disc levels:  Multilevel degenerative changes. Upper chest: Negative. Other: Bilateral carotid bulb calcified plaques. IMPRESSION: 1. No acute intracranial hemorrhage. 2. No acute/traumatic cervical spine pathology. Electronically Signed   By: Anner Crete M.D.   On: 12/18/2018 19:04   Ct Cervical Spine Wo Contrast  Result Date: 12/18/2018 CLINICAL DATA:  85 year old male with fall. EXAM: CT HEAD WITHOUT CONTRAST CT CERVICAL SPINE WITHOUT CONTRAST TECHNIQUE: Multidetector CT imaging of the head and cervical spine was performed following the standard protocol without intravenous contrast. Multiplanar CT image reconstructions of the cervical spine were also generated. COMPARISON:  Head CT dated 02/08/2017 FINDINGS: Evaluation of this exam is limited due to motion artifact. CT HEAD FINDINGS Brain: There is mild age-related atrophy and chronic microvascular ischemic changes. There is no acute intracranial hemorrhage. No mass effect or midline shift. No extra-axial fluid collection. Vascular: No hyperdense  vessel or unexpected calcification. Skull: Normal. Negative for fracture or focal lesion. Sinuses/Orbits: No acute finding. Other: None CT CERVICAL SPINE FINDINGS Alignment: No acute subluxation. Skull base and vertebrae: No acute fracture. Soft tissues and spinal canal: No prevertebral fluid or swelling. No visible canal hematoma. Disc levels:  Multilevel degenerative changes. Upper chest: Negative. Other: Bilateral carotid bulb calcified plaques. IMPRESSION: 1. No acute intracranial hemorrhage. 2. No acute/traumatic cervical spine pathology. Electronically Signed   By: Anner Crete M.D.   On: 12/18/2018 19:04   Dg Chest Portable 1 View  Result Date: 12/18/2018 CLINICAL DATA:  AMS EXAM: PORTABLE CHEST 1 VIEW COMPARISON:  Chest radiographs 12/06/2018, 10/22/2018 FINDINGS: Stable cardiomediastinal contours status post median sternotomy. Cardiomegaly. Aortic arch calcification. There is central vascular congestion. There are right basilar opacities favored to represent atelectasis. There is opacification of the left hemidiaphragm which may represent atelectasis, infiltrate not excluded. No pneumothorax. No acute osseous abnormality in the visualized skeleton. IMPRESSION: 1. Right basilar opacities favored to represent atelectasis. 2. Opacification of the left hemidiaphragm may represent atelectasis, infiltrate not excluded. This could be better assessed with PA and lateral radiographs. 3. Cardiomegaly with central venous congestion. Electronically Signed   By: Audie Pinto M.D.   On: 12/18/2018 18:13   Ct Renal Stone Study  Result Date: 12/22/2018 CLINICAL DATA:  Right flank pain EXAM: CT ABDOMEN AND PELVIS WITHOUT CONTRAST TECHNIQUE: Multidetector CT imaging of the abdomen and pelvis was performed following the standard protocol without IV contrast. COMPARISON:  12/13/2018 FINDINGS: Lower chest: Small bilateral pleural effusions and associated atelectasis or consolidation. Cardiomegaly with extensive  3 vessel coronary artery calcifications and/or stents status post median sternotomy. Aortic valve calcification. Hepatobiliary: No focal liver abnormality is seen. Status post cholecystectomy. No biliary dilatation. Pancreas: Unremarkable. No pancreatic ductal dilatation or surrounding inflammatory changes. Spleen: Normal in size without significant abnormality. Adrenals/Urinary Tract: Adrenal glands are unremarkable. Unchanged 1.1 cm nonobstructive inferior pole calculus of the right kidney. Bladder is unremarkable. Stomach/Bowel: Stomach is within normal limits. Appendix appears normal. No evidence of bowel wall thickening, distention, or inflammatory changes. Pancolonic diverticulosis. Vascular/Lymphatic: Aortic atherosclerosis. No enlarged abdominal or pelvic lymph nodes. Reproductive: No mass or other significant abnormality. Other: Fat containing left inguinal hernia. No abdominopelvic ascites. Musculoskeletal: No acute or significant osseous findings. IMPRESSION: 1. No acute CT findings of the abdomen or pelvis to explain right-sided flank pain. Unchanged 1.1 cm nonobstructive inferior pole calculus of the right kidney. No evidence of ureteral calculus or hydronephrosis. 2. Small bilateral pleural effusions, new compared to prior examination. 3.  Other chronic and incidental findings as detailed above. Electronically Signed   By: Eddie Candle M.D.   On: 12/22/2018 21:24    Orson Eva, DO  [facility] Pager [phone]  If 7PM-7AM, please contact night-coverage [email or web address] Password [email or web address] 12/23/2018, 5:19 PM   LOS: 4 days

## 2018-12-23 NOTE — Progress Notes (Addendum)
Physical Therapy Treatment Patient Details Name: [name] MRN: [id number] DOB: 1933/05/08 Today's Date: 12/23/2018    History of Present Illness [name] is a 85 y.o. male with medical history significant of DM; CAD s/p stents; OSA; morbid obesity (BMI 57); HTN; HLD; and COPD presenting with AMS s/p fall.  Patient is a poor historian and is unaccompanied.  He reports that he came because his son made him.  He fell days to a week ago and has had increasing confusion since.  He has not been taking much PO intake and is unsure why.  He has noticed mild slurring of speech.    PT Comments    Patient presents up in chair and agreeable for therapy.  Patient limited for completing marching in place while seated in chair due to c/o weakness, c/o severe nausea with dry heaving upon standing up, limited to ambulation to doorway and back to bedside due nausea and fatigue - RN notified.  Patient put back to bed after therapy due to severe nausea when standing/sitting.  Patient on room air during treatment with SpO2 maintained at 91-93% and put back on 2 LPM O2 after therapy.  Patient will benefit from continued physical therapy in hospital and recommended venue below to increase strength, balance, endurance for safe ADLs and gait.   Follow Up Recommendations  SNF;Supervision for mobility/OOB;Supervision - Intermittent     Equipment Recommendations  None recommended by PT    Recommendations for Other Services       Precautions / Restrictions Precautions Precautions: Fall Restrictions Weight Bearing Restrictions: No    Mobility  Bed Mobility               General bed mobility comments: Patient presents seated in chair (assisted by nursing staff)  Transfers Overall transfer level: Needs assistance Equipment used: Rolling walker (2 wheeled) Transfers: Sit to/from Stand;Stand Pivot Transfers Sit to Stand: Min guard Stand pivot transfers: Min guard;Min assist        General transfer comment: increased time, labored movement  Ambulation/Gait Ambulation/Gait assistance: Min assist Gait Distance (Feet): 20 Feet Assistive device: Rolling walker (2 wheeled) Gait Pattern/deviations: Decreased step length - right;Decreased step length - left;Decreased stride length Gait velocity: decreased   General Gait Details: slow labored movement with short step/stride length, limited mostly due to c/o nausea with occasional dry heaving   Stairs             Wheelchair Mobility    Modified Rankin (Stroke Patients Only)       Balance Overall balance assessment: Needs assistance Sitting-balance support: Feet supported;No upper extremity supported Sitting balance-Leahy Scale: Fair Sitting balance - Comments: fair/good seated in chair   Standing balance support: Bilateral upper extremity supported;During functional activity Standing balance-Leahy Scale: Fair Standing balance comment: using RW                            Cognition Arousal/Alertness: Awake/alert Behavior During Therapy: WFL for tasks assessed/performed Overall Cognitive Status: Within Functional Limits for tasks assessed                                        Exercises General Exercises - Lower Extremity Long Arc Quad: Seated;AROM;Strengthening;Both;10 reps Hip Flexion/Marching: Seated;AROM;Strengthening;10 reps;Both Toe Raises: Seated;AROM;Strengthening;10 reps;Both Heel Raises: Seated;AROM;Strengthening;Both;10 reps    General Comments  Pertinent Vitals/Pain Pain Assessment: No/denies pain    Home Living                      Prior Function            PT Goals (current goals can now be found in the care plan section) Acute Rehab PT Goals Patient Stated Goal: Feel better. PT Goal Formulation: With patient Time For Goal Achievement: 01/02/19 Potential to Achieve Goals: Good Progress towards PT goals: Progressing toward  goals    Frequency    Min 3X/week      PT Plan Current plan remains appropriate    Co-evaluation              AM-PAC PT "6 Clicks" Mobility   Outcome Measure  Help needed turning from your back to your side while in a flat bed without using bedrails?: A Little Help needed moving from lying on your back to sitting on the side of a flat bed without using bedrails?: A Little Help needed moving to and from a bed to a chair (including a wheelchair)?: A Little Help needed standing up from a chair using your arms (e.g., wheelchair or bedside chair)?: A Little Help needed to walk in hospital room?: A Lot Help needed climbing 3-5 steps with a railing? : A Lot 6 Click Score: 16    End of Session   Activity Tolerance: Patient tolerated treatment well;Patient limited by fatigue;Other (comment)(Patient limited due to nausea) Patient left: in bed;with call bell/phone within reach Nurse Communication: Mobility status PT Visit Diagnosis: Unsteadiness on feet (R26.81);Other abnormalities of gait and mobility (R26.89);Muscle weakness (generalized) (M62.81)     Time: [id number] PT Time Calculation (min) (ACUTE ONLY): 33 min  Charges:  $Therapeutic Exercise: 8-22 mins $Therapeutic Activity: 8-22 mins                     12:14 PM, 12/23/18 [name] Grandchild, MPT Physical Therapist with [facility] 336 [phone] office [phone] mobile phone

## 2018-12-23 NOTE — Progress Notes (Signed)
Patient stated he did not want to wear CPAP tonight. Patient is actively throwing up and CPAP is contraindicated at this time. Will continue to monitor.

## 2018-12-23 NOTE — Care Management Important Message (Signed)
Important Message  Patient Details  Name: [name] MRN: [id number] Date of Birth: 1933-11-26   Medicare Important Message Given:  Yes     Tommy Medal 12/23/2018, 1:05 PM

## 2018-12-23 NOTE — Progress Notes (Signed)
   [facility] will sign off.   Medication Recommendations:  48 hrs of anticoag, continue DAPT ( has been on long term), lopressor 50mg  bid. No ACE given CKD IV no statin given prior intolerance Other recommendations (labs, testing, etc):  None  Follow up as an outpatient: 02/20/19 at 2 pm  For questions or updates, please contact [place] Please consult [email or web address] for contact info under        Signed, Cecilie Kicks, NP  12/23/2018, 11:26 AM

## 2018-12-23 NOTE — TOC Progression Note (Addendum)
Transition of Care [facility]) - Progression Note    Patient Details  Name: [name] MRN: [id number] Date of Birth: 12/06/1933  [facility]) CM/SW Contact  Ihor Gully, LCSW Phone Number: 12/23/2018, 3:33 PM  Clinical Narrative:    Patient authorized and will discharge to [facility] on 12/24/2018. GraceAnne at facility agreeable to 12/24/2018 discharge. Requests that discharge summary and Covid test from admission be faxed to [phone] and placed on the [place].  Expected Discharge Plan: Skilled Nursing Facility Barriers to Discharge: No Barriers Identified  Expected Discharge Plan and Services Expected Discharge Plan: [place] Choice: [place] Living arrangements for the past 2 months: Single Family Home                                       Social Determinants of Health (SDOH) Interventions    Readmission Risk Interventions Readmission Risk Prevention Plan 12/21/2018 12/20/2018 10/25/2018  Transportation Screening - Complete Complete  PCP or Specialist Appt within 3-5 Days - - Not Complete  HRI or [place] - - Complete  Social Work Consult for [place] Planning/Counseling - - Complete  Palliative Care Screening - - Not Applicable  Medication Review Press photographer) - Complete Complete  PCP or Specialist appointment within 3-5 days of discharge - Not Complete -  PCP/Specialist Appt Not Complete comments Patient will be going to SNF and will be seen by facility medical director. - -  HRI or Home Care Consult - Complete -  SW Recovery Care/Counseling Consult - Complete -  Palliative Care Screening - Not Complete -  [place] - Complete -  Some recent data might be hidden

## 2018-12-23 NOTE — Progress Notes (Signed)
Patient has c/o nausea throughout the day, PRN Zofran given as ordered. Patient reports feeling "the best I've felt all day" at this time. Heparin drip stopped, urine collected. Drinking liquids. Call bell in reach, will continue to monitor.

## 2018-12-24 DIAGNOSIS — M6281 Muscle weakness (generalized): Secondary | ICD-10-CM | POA: Diagnosis not present

## 2018-12-24 DIAGNOSIS — E1122 Type 2 diabetes mellitus with diabetic chronic kidney disease: Secondary | ICD-10-CM | POA: Diagnosis not present

## 2018-12-24 DIAGNOSIS — I200 Unstable angina: Secondary | ICD-10-CM | POA: Diagnosis not present

## 2018-12-24 DIAGNOSIS — I2510 Atherosclerotic heart disease of native coronary artery without angina pectoris: Secondary | ICD-10-CM | POA: Diagnosis not present

## 2018-12-24 DIAGNOSIS — N189 Chronic kidney disease, unspecified: Secondary | ICD-10-CM

## 2018-12-24 DIAGNOSIS — I130 Hypertensive heart and chronic kidney disease with heart failure and stage 1 through stage 4 chronic kidney disease, or unspecified chronic kidney disease: Secondary | ICD-10-CM | POA: Diagnosis not present

## 2018-12-24 DIAGNOSIS — I959 Hypotension, unspecified: Secondary | ICD-10-CM | POA: Diagnosis not present

## 2018-12-24 DIAGNOSIS — R2689 Other abnormalities of gait and mobility: Secondary | ICD-10-CM | POA: Diagnosis not present

## 2018-12-24 DIAGNOSIS — D649 Anemia, unspecified: Secondary | ICD-10-CM | POA: Diagnosis not present

## 2018-12-24 DIAGNOSIS — J9610 Chronic respiratory failure, unspecified whether with hypoxia or hypercapnia: Secondary | ICD-10-CM | POA: Diagnosis not present

## 2018-12-24 DIAGNOSIS — I10 Essential (primary) hypertension: Secondary | ICD-10-CM | POA: Diagnosis not present

## 2018-12-24 DIAGNOSIS — E782 Mixed hyperlipidemia: Secondary | ICD-10-CM | POA: Diagnosis not present

## 2018-12-24 DIAGNOSIS — U071 COVID-19: Secondary | ICD-10-CM | POA: Diagnosis not present

## 2018-12-24 DIAGNOSIS — J9611 Chronic respiratory failure with hypoxia: Secondary | ICD-10-CM | POA: Diagnosis not present

## 2018-12-24 DIAGNOSIS — R0602 Shortness of breath: Secondary | ICD-10-CM | POA: Diagnosis not present

## 2018-12-24 DIAGNOSIS — G9341 Metabolic encephalopathy: Secondary | ICD-10-CM | POA: Diagnosis not present

## 2018-12-24 DIAGNOSIS — N179 Acute kidney failure, unspecified: Secondary | ICD-10-CM | POA: Diagnosis not present

## 2018-12-24 DIAGNOSIS — Z794 Long term (current) use of insulin: Secondary | ICD-10-CM | POA: Diagnosis not present

## 2018-12-24 DIAGNOSIS — E1165 Type 2 diabetes mellitus with hyperglycemia: Secondary | ICD-10-CM | POA: Diagnosis not present

## 2018-12-24 DIAGNOSIS — J189 Pneumonia, unspecified organism: Secondary | ICD-10-CM | POA: Diagnosis not present

## 2018-12-24 DIAGNOSIS — Z7401 Bed confinement status: Secondary | ICD-10-CM | POA: Diagnosis not present

## 2018-12-24 DIAGNOSIS — J440 Chronic obstructive pulmonary disease with acute lower respiratory infection: Secondary | ICD-10-CM | POA: Diagnosis not present

## 2018-12-24 DIAGNOSIS — E860 Dehydration: Secondary | ICD-10-CM | POA: Diagnosis not present

## 2018-12-24 DIAGNOSIS — I5042 Chronic combined systolic (congestive) and diastolic (congestive) heart failure: Secondary | ICD-10-CM | POA: Diagnosis not present

## 2018-12-24 DIAGNOSIS — R41841 Cognitive communication deficit: Secondary | ICD-10-CM | POA: Diagnosis not present

## 2018-12-24 DIAGNOSIS — Z79899 Other long term (current) drug therapy: Secondary | ICD-10-CM | POA: Diagnosis not present

## 2018-12-24 DIAGNOSIS — N183 Chronic kidney disease, stage 3 (moderate): Secondary | ICD-10-CM | POA: Diagnosis not present

## 2018-12-24 LAB — CBC
HCT: 36.1 % — ABNORMAL LOW (ref 39.0–52.0)
Hemoglobin: 11.2 g/dL — ABNORMAL LOW (ref 13.0–17.0)
MCH: 28.9 pg (ref 26.0–34.0)
MCHC: 31 g/dL (ref 30.0–36.0)
MCV: 93.3 fL (ref 80.0–100.0)
Platelets: 149 10*3/uL — ABNORMAL LOW (ref 150–400)
RBC: 3.87 MIL/uL — ABNORMAL LOW (ref 4.22–5.81)
RDW: 14.1 % (ref 11.5–15.5)
WBC: 6 10*3/uL (ref 4.0–10.5)
nRBC: 0 % (ref 0.0–0.2)

## 2018-12-24 LAB — ABO/RH: ABO/RH(D): A POS

## 2018-12-24 LAB — MAGNESIUM: Magnesium: 1.4 mg/dL — ABNORMAL LOW (ref 1.7–2.4)

## 2018-12-24 LAB — GLUCOSE, CAPILLARY
Glucose-Capillary: 151 mg/dL — ABNORMAL HIGH (ref 70–99)
Glucose-Capillary: 173 mg/dL — ABNORMAL HIGH (ref 70–99)
Glucose-Capillary: 159 mg/dL — ABNORMAL HIGH (ref 70–99)

## 2018-12-24 LAB — SARS CORONAVIRUS 2 BY RT PCR (HOSPITAL ORDER, PERFORMED IN ~~LOC~~ HOSPITAL LAB): SARS Coronavirus 2: NEGATIVE

## 2018-12-24 MED ORDER — PANTOPRAZOLE SODIUM 40 MG PO TBEC: 40.0000 mg | DELAYED_RELEASE_TABLET | Freq: Every day | ORAL | 1 refills | Status: DC

## 2018-12-24 MED ORDER — MAGNESIUM OXIDE 400 (241.3 MG) MG PO TABS: 400.0000 mg | ORAL_TABLET | Freq: Every day | ORAL | Status: DC

## 2018-12-24 MED ORDER — MAGNESIUM SULFATE 2 GM/50ML IV SOLN
2.0000 g | Freq: Once | INTRAVENOUS | Status: AC
  Administered 2018-12-24: 2 g via INTRAVENOUS
  Filled 2018-12-24: qty 50

## 2018-12-24 MED ORDER — ISOSORBIDE MONONITRATE ER 60 MG PO TB24: 60.0000 mg | ORAL_TABLET | Freq: Two times a day (BID) | ORAL | Status: DC

## 2018-12-24 MED ORDER — ISOSORBIDE MONONITRATE ER 60 MG PO TB24: 60.0000 mg | ORAL_TABLET | Freq: Two times a day (BID) | ORAL | 1 refills | Status: DC

## 2018-12-24 MED ORDER — MAGNESIUM OXIDE 400 (241.3 MG) MG PO TABS: 400.0000 mg | ORAL_TABLET | Freq: Every day | ORAL | 1 refills | Status: DC

## 2018-12-24 MED ORDER — ALPRAZOLAM 1 MG PO TABS
1.0000 mg | ORAL_TABLET | Freq: Two times a day (BID) | ORAL | Status: DC | PRN
  Administered 2018-12-24: 20:00:00 1 mg via ORAL
  Filled 2018-12-24: qty 1

## 2018-12-24 MED ORDER — ALPRAZOLAM 1 MG PO TABS: 1.0000 mg | ORAL_TABLET | Freq: Two times a day (BID) | ORAL | 0 refills | Status: DC | PRN

## 2018-12-24 MED ORDER — ISOSORBIDE MONONITRATE ER 120 MG PO TB24: 120.0000 mg | ORAL_TABLET | Freq: Every day | ORAL | 1 refills | Status: DC

## 2018-12-24 MED ORDER — ISOSORBIDE MONONITRATE ER 60 MG PO TB24
120.0000 mg | ORAL_TABLET | Freq: Every day | ORAL | Status: DC
  Administered 2018-12-24: 120 mg via ORAL
  Filled 2018-12-24: qty 2

## 2018-12-24 MED ORDER — NEPRO/CARBSTEADY PO LIQD: 237.0000 mL | Freq: Three times a day (TID) | ORAL | 0 refills | Status: DC | PRN

## 2018-12-24 NOTE — Progress Notes (Signed)
[facility] arrived to the floor to pick patient up to transfer to [facility] in [place]. Gave patient xanax 1mg  at 2015 for patient anxiety. IV removed by day shift.

## 2018-12-24 NOTE — TOC Transition Note (Signed)
Transition of Care [facility]) - CM/SW Discharge Note   Patient Details  Name: [name] MRN: [id number] Date of Birth: 07/03/1933  Transition of Care [facility]) CM/SW Contact:  Latanya Maudlin, RN Phone Number: 12/24/2018, 11:06 AM   Clinical Narrative:  Patient medically cleared for discharge. Plans to go to [facility]. Spoke to admitting RN who has bed assignment for room 35. Faxed dc summary and negative COVID test through the [facility] but also via manual fax per their request. Number for report given to bedside RN. EMS for transport.      Final next level of care: [place] Barriers to Discharge: No Barriers Identified   Patient Goals and CMS Choice Patient states their goals for this hospitalization and ongoing recovery are:: to go to SNF then return home. CMS Medicare.gov Compare Post Acute Care list provided to:: Patient Represenative (must comment)(Wife/Son) Choice offered to / list presented to : Spouse, Adult Children  Discharge Placement                       Discharge Plan and Services     Post Acute Care Choice: [place]                               Social Determinants of Health (SDOH) Interventions     Readmission Risk Interventions Readmission Risk Prevention Plan 12/24/2018 12/21/2018 12/20/2018  Transportation Screening Complete - Complete  PCP or Specialist Appt within 3-5 Days - - -  [place]RI or [place] for [place] - - -  Medication Review Press photographer) Complete - Complete  PCP or Specialist appointment within 3-5 days of discharge Complete - Not Complete  PCP/Specialist Appt Not Complete comments - Patient will be going to SNF and will be seen by facility medical director. -  [place] or Home Care Consult Complete - Complete  SW Recovery Care/Counseling Consult Complete - Complete  Palliative Care Screening Not Applicable -  Not Complete  Skilled Nursing Facility Complete - Complete  Some recent data might be hidden

## 2018-12-24 NOTE — Progress Notes (Signed)
Patient has calmed down since earlier in the shift. No longer nauseous and throwing up. Patient given Karlyn Agee and zofran and requesting to go on CPAP. RT placed patient on CPAP at previous settings.

## 2018-12-24 NOTE — Discharge Summary (Signed)
Physician Discharge Summary  [name] [id number] DOB: 1934/02/15 DOA: 12/18/2018  PCP: [name], MD  Admit date: 12/18/2018 Discharge date: 12/24/2018  Admitted From: Home Disposition:  SNF  Recommendations for Outpatient Follow-up:  1. Follow up with PCP in 1-2 weeks 2. Please obtain BMP/CBC in one week    Discharge Condition: Stable CODE STATUS: DNR Diet recommendation: Heart Healthy / Carb Modified   Brief/Interim Summary: 85 y.o.malewith medical history significant ofDM; CAD s/p stents; OSA; morbid obesity (BMI 43); HTN; HLD; and COPD presenting with AMS s/p fall.There was no loss of consciousness.Patient is a poor historian.Because of the patient's continued confusion, he was brought to emergency department for further evaluation.He was last admitted from 7/11-14 for LLL PNA and acute on chronic renal failure with d/c creatinine 2.3. ED Course:Weakness, dehydration, markedly elevated BUN (but 100 last admission), CKD, decreased PO intake, not altered. Head CTnegdespite recent fall. CXR ok and no respiratory symptoms. Dr. Jonnie Finner suggests observation, IVF, monitoring.   The patient was started on IV fluids with improvement of his renal function back to baseline.  During the hospitalization, the patient developed angina.  Cardiology was consulted secondary to his elevated troponins.  The patient received intravenous heparin x48 hours.  His Imdur was increased.  the patient did not have any further chest discomfort.  The patient did have some episodes of intermittent nausea.  This was likely attributed to the the patient's acute medical condition as well as a component of diabetic gastroparesis.  The patient was put on Protonix.  His nausea gradually improved and he tolerated his diet.  Discharge Diagnoses:  acute on chronic renal failure--CKD3 -appears to be in the setting of dehydration and hypotension -baseline creatinine 1.9-2.2 -serum  creatinine peaked 3.52 -serum creatinine 2.00 at the time of d/c  acute metabolic encephalopathy -associated with ongoing dehydration and uremia -CT head negative -CT C-spine neg -Mentation improved -Currently oriented x3and able to follow commands properly -12/22/2018--mental status at baselineper pt's son at bedside  Chronic combined systolic and diastolic CHF -Stable and compensated -4/9/20echo demonstrating 40 to 45% ejection fraction -daily weights -resume demadex on 12/21/2018  Chest Pain/CAD/Elevated troponin--Unstable Angina -pt had chest pain am 12/21/18 -check tropoinins--[id number] -Appreciate cardiology consult-->plan to treat medically;continue heparin IV x48 hours;poor candidate for invasive procedure/cardiac catheterization--finished ~1700 on 9/11 -check EKG--personally reviewed--sinus, nonspecific STT changes -not a candidate for aggressive intervention -restartedhydralazine -increased imdur to 120 mg daily -no further chest pain  chronic respiratory failure with hypoxia -No wheezing and no complaints of shortness of breath -Continue oxygen supplementation--2.5 L -Continue PRNBDs  COPD -stable on home 2.5L -no wheezing  Essential hypertension, benign -meds initially held due to soft BP -restarted metoprolol -restartedhydralazine -Continue heart healthy diet.  Obesity, Class III, BMI 40-49.9 (morbid obesity) (HCC) -Body mass index is 41.2 kg/m. -Low calorie diet and portion control discussed with patient.  Hyperlipidemia -Patient has reported allergies to the use of Zocor and Lipitor in the past -Continue heart healthy/low-fat diet -hx of statin intolerance  Type 2 diabetes mellitus with stage 3 chronic kidney disease, with long-term current use of insulin ([place]) -12/18/18 A1C--8.8 -Continue sliding scale insulin and long-acting insulin -add novolog 4 units tiw during the hospitalization -CBGs controlled during the  hospitalization  Right flank pain -CT abdomen pelvis with renal stone protocol--negative for hydronephrosis or ureteral stone.  Unchanged right intrarenal stone  Sleep apnea -Continue the use of CPAP nightly  Nausea -multifactorial including acute medical illness, a degree of gastroparesis -started PPI -zofran  around the clock  -LFTs and lipase normal -overall improved and tolerated diet  Hypomagnesemia -repleted   Discharge Instructions   Allergies as of 12/24/2018      Reactions   Zocor [simvastatin] Other (See Comments)   fatigue   Lipitor [atorvastatin] Other (See Comments)   Muscle cramping      Medication List    TAKE these medications   acetaminophen 325 MG tablet Commonly known as: TYLENOL Take 650 mg by mouth every 4 (four) hours as needed.   allopurinol 100 MG tablet Commonly known as: ZYLOPRIM Take 2 tablets (200 mg total) by mouth daily.   ALPRAZolam 1 MG tablet Commonly known as: XANAX Take 1 tablet (1 mg total) by mouth 2 (two) times daily as needed for anxiety. What changed:   medication strength  how much to take  when to take this  reasons to take this   aspirin EC 81 MG tablet Take 81 mg by mouth every morning.   clopidogrel 75 MG tablet Commonly known as: PLAVIX TAKE 1 TABLET BY MOUTH EVERY DAY WITH BREAKFAST   escitalopram 5 MG tablet Commonly known as: LEXAPRO Take 1 tablet by mouth daily.   feeding supplement (NEPRO CARB STEADY) Liqd Take 237 mLs by mouth 3 (three) times daily as needed (Supplement).   hydrALAZINE 10 MG tablet Commonly known as: APRESOLINE TAKE 1 TABLET(10 MG) BY MOUTH EVERY 8 HOURS   insulin aspart 100 UNIT/ML injection Commonly known as: novoLOG Inject 7 Units into the skin 3 (three) times daily with meals. Give only if eats 50% or more of meal.   Insulin Glargine (2 Unit Dial) 300 UNIT/ML Sopn Commonly known as: Toujeo Max SoloStar Inject 60 Units into the skin at bedtime.   isosorbide  mononitrate 120 MG 24 hr tablet Commonly known as: IMDUR Take 1 tablet (120 mg total) by mouth daily. What changed:   medication strength  See the new instructions.   magnesium oxide 400 (241.3 Mg) MG tablet Commonly known as: MAG-OX Take 1 tablet (400 mg total) by mouth daily. Start taking on: December 25, 2018   metoprolol tartrate 50 MG tablet Commonly known as: LOPRESSOR TAKE 1 TABLET(50 MG) BY MOUTH TWICE DAILY   nitroGLYCERIN 0.4 MG/SPRAY spray Commonly known as: NITROLINGUAL Place 1 spray under the tongue every 5 (five) minutes x 3 doses as needed for chest pain.   ondansetron 4 MG tablet Commonly known as: ZOFRAN Take 1 tablet (4 mg total) by mouth every 8 (eight) hours as needed for nausea or vomiting.   OXYGEN Inhale 2 L/min into the lungs continuous.   pantoprazole 40 MG tablet Commonly known as: PROTONIX Take 1 tablet (40 mg total) by mouth daily.   polyethylene glycol powder 17 GM/SCOOP powder Commonly known as: GLYCOLAX/MIRALAX Take 17 g by mouth daily as needed (constipation).   psyllium 58.6 % powder Commonly known as: METAMUCIL Take 2 tsp by mouth: Mix with 8 ounces liquid and drink once a day   tamsulosin 0.4 MG Caps capsule Commonly known as: FLOMAX Take 1 capsule (0.4 mg total) by mouth every evening.   torsemide 20 MG tablet Commonly known as: DEMADEX Take 1 tablet (20 mg total) by mouth daily. Beginning 08/05/2018   UNABLE TO FIND CPAP from home while sleeping, at previous home settings       Contact information for follow-up providers    Satira Sark, MD Follow up on 02/20/2019.   Specialty: Cardiology Why: at 2 pm  Contact information: [place]  [place] [address] [phone]            Contact information for after-discharge care    Destination    [facility] .   Service: Skilled Nursing Contact information: [address] [place]  [address] [phone]                 Allergies  Allergen Reactions   Zocor [Simvastatin] Other (See Comments)    fatigue   Lipitor [Atorvastatin] Other (See Comments)    Muscle cramping    Consultations:  cardiology   Procedures/Studies: Ct Head Wo Contrast  Result Date: 12/18/2018 CLINICAL DATA:  85 year old male with fall. EXAM: CT HEAD WITHOUT CONTRAST CT CERVICAL SPINE WITHOUT CONTRAST TECHNIQUE: Multidetector CT imaging of the head and cervical spine was performed following the standard protocol without intravenous contrast. Multiplanar CT image reconstructions of the cervical spine were also generated. COMPARISON:  Head CT dated 02/08/2017 FINDINGS: Evaluation of this exam is limited due to motion artifact. CT HEAD FINDINGS Brain: There is mild age-related atrophy and chronic microvascular ischemic changes. There is no acute intracranial hemorrhage. No mass effect or midline shift. No extra-axial fluid collection. Vascular: No hyperdense vessel or unexpected calcification. Skull: Normal. Negative for fracture or focal lesion. Sinuses/Orbits: No acute finding. Other: None CT CERVICAL SPINE FINDINGS Alignment: No acute subluxation. Skull base and vertebrae: No acute fracture. Soft tissues and spinal canal: No prevertebral fluid or swelling. No visible canal hematoma. Disc levels:  Multilevel degenerative changes. Upper chest: Negative. Other: Bilateral carotid bulb calcified plaques. IMPRESSION: 1. No acute intracranial hemorrhage. 2. No acute/traumatic cervical spine pathology. Electronically Signed   By: Anner Crete M.D.   On: 12/18/2018 19:04   Ct Cervical Spine Wo Contrast  Result Date: 12/18/2018 CLINICAL DATA:  85 year old male with fall. EXAM: CT HEAD WITHOUT CONTRAST CT CERVICAL SPINE WITHOUT CONTRAST TECHNIQUE: Multidetector CT imaging of the head and cervical spine was performed following the standard protocol without intravenous contrast. Multiplanar CT image  reconstructions of the cervical spine were also generated. COMPARISON:  Head CT dated 02/08/2017 FINDINGS: Evaluation of this exam is limited due to motion artifact. CT HEAD FINDINGS Brain: There is mild age-related atrophy and chronic microvascular ischemic changes. There is no acute intracranial hemorrhage. No mass effect or midline shift. No extra-axial fluid collection. Vascular: No hyperdense vessel or unexpected calcification. Skull: Normal. Negative for fracture or focal lesion. Sinuses/Orbits: No acute finding. Other: None CT CERVICAL SPINE FINDINGS Alignment: No acute subluxation. Skull base and vertebrae: No acute fracture. Soft tissues and spinal canal: No prevertebral fluid or swelling. No visible canal hematoma. Disc levels:  Multilevel degenerative changes. Upper chest: Negative. Other: Bilateral carotid bulb calcified plaques. IMPRESSION: 1. No acute intracranial hemorrhage. 2. No acute/traumatic cervical spine pathology. Electronically Signed   By: Anner Crete M.D.   On: 12/18/2018 19:04   Dg Chest Portable 1 View  Result Date: 12/18/2018 CLINICAL DATA:  AMS EXAM: PORTABLE CHEST 1 VIEW COMPARISON:  Chest radiographs 12/06/2018, 10/22/2018 FINDINGS: Stable cardiomediastinal contours status post median sternotomy. Cardiomegaly. Aortic arch calcification. There is central vascular congestion. There are right basilar opacities favored to represent atelectasis. There is opacification of the left hemidiaphragm which may represent atelectasis, infiltrate not excluded. No pneumothorax. No acute osseous abnormality in the visualized skeleton. IMPRESSION: 1. Right basilar opacities favored to represent atelectasis. 2. Opacification of the left hemidiaphragm may represent atelectasis, infiltrate not excluded. This could be better assessed with  PA and lateral radiographs. 3. Cardiomegaly with central venous congestion. Electronically Signed   By: Audie Pinto M.D.   On: 12/18/2018 18:13   Ct Renal  Stone Study  Result Date: 12/22/2018 CLINICAL DATA:  Right flank pain EXAM: CT ABDOMEN AND PELVIS WITHOUT CONTRAST TECHNIQUE: Multidetector CT imaging of the abdomen and pelvis was performed following the standard protocol without IV contrast. COMPARISON:  12/13/2018 FINDINGS: Lower chest: Small bilateral pleural effusions and associated atelectasis or consolidation. Cardiomegaly with extensive 3 vessel coronary artery calcifications and/or stents status post median sternotomy. Aortic valve calcification. Hepatobiliary: No focal liver abnormality is seen. Status post cholecystectomy. No biliary dilatation. Pancreas: Unremarkable. No pancreatic ductal dilatation or surrounding inflammatory changes. Spleen: Normal in size without significant abnormality. Adrenals/Urinary Tract: Adrenal glands are unremarkable. Unchanged 1.1 cm nonobstructive inferior pole calculus of the right kidney. Bladder is unremarkable. Stomach/Bowel: Stomach is within normal limits. Appendix appears normal. No evidence of bowel wall thickening, distention, or inflammatory changes. Pancolonic diverticulosis. Vascular/Lymphatic: Aortic atherosclerosis. No enlarged abdominal or pelvic lymph nodes. Reproductive: No mass or other significant abnormality. Other: Fat containing left inguinal hernia. No abdominopelvic ascites. Musculoskeletal: No acute or significant osseous findings. IMPRESSION: 1. No acute CT findings of the abdomen or pelvis to explain right-sided flank pain. Unchanged 1.1 cm nonobstructive inferior pole calculus of the right kidney. No evidence of ureteral calculus or hydronephrosis. 2. Small bilateral pleural effusions, new compared to prior examination. 3.  Other chronic and incidental findings as detailed above. Electronically Signed   By: Eddie Candle M.D.   On: 12/22/2018 21:24        Discharge Exam: Vitals:   12/23/18 2232 12/24/18 0453  BP: (!) 150/77 (!) 142/67  Pulse: 70 65  Resp:  18  Temp:  98.2 F (36.8  C)  SpO2:  92%   Vitals:   12/23/18 1940 12/23/18 2108 12/23/18 2232 12/24/18 0453  BP:  (!) 192/68 (!) 150/77 (!) 142/67  Pulse:  75 70 65  Resp:  20  18  Temp:  98.7 F (37.1 C)  98.2 F (36.8 C)  TempSrc:  Oral    SpO2: (!) 89% 95%  92%  Weight:      Height:        General: Pt is alert, awake, not in acute distress Cardiovascular: RRR, S1/S2 +, no rubs, no gallops Respiratory: CTA bilaterally, no wheezing, no rhonchi Abdominal: Soft, NT, ND, bowel sounds + Extremities: no edema, no cyanosis   The results of significant diagnostics from this hospitalization (including imaging, microbiology, ancillary and laboratory) are listed below for reference.    Significant Diagnostic Studies: Ct Head Wo Contrast  Result Date: 12/18/2018 CLINICAL DATA:  85 year old male with fall. EXAM: CT HEAD WITHOUT CONTRAST CT CERVICAL SPINE WITHOUT CONTRAST TECHNIQUE: Multidetector CT imaging of the head and cervical spine was performed following the standard protocol without intravenous contrast. Multiplanar CT image reconstructions of the cervical spine were also generated. COMPARISON:  Head CT dated 02/08/2017 FINDINGS: Evaluation of this exam is limited due to motion artifact. CT HEAD FINDINGS Brain: There is mild age-related atrophy and chronic microvascular ischemic changes. There is no acute intracranial hemorrhage. No mass effect or midline shift. No extra-axial fluid collection. Vascular: No hyperdense vessel or unexpected calcification. Skull: Normal. Negative for fracture or focal lesion. Sinuses/Orbits: No acute finding. Other: None CT CERVICAL SPINE FINDINGS Alignment: No acute subluxation. Skull base and vertebrae: No acute fracture. Soft tissues and spinal canal: No prevertebral fluid or swelling. No visible canal  hematoma. Disc levels:  Multilevel degenerative changes. Upper chest: Negative. Other: Bilateral carotid bulb calcified plaques. IMPRESSION: 1. No acute intracranial hemorrhage. 2. No  acute/traumatic cervical spine pathology. Electronically Signed   By: Anner Crete M.D.   On: 12/18/2018 19:04   Ct Cervical Spine Wo Contrast  Result Date: 12/18/2018 CLINICAL DATA:  85 year old male with fall. EXAM: CT HEAD WITHOUT CONTRAST CT CERVICAL SPINE WITHOUT CONTRAST TECHNIQUE: Multidetector CT imaging of the head and cervical spine was performed following the standard protocol without intravenous contrast. Multiplanar CT image reconstructions of the cervical spine were also generated. COMPARISON:  Head CT dated 02/08/2017 FINDINGS: Evaluation of this exam is limited due to motion artifact. CT HEAD FINDINGS Brain: There is mild age-related atrophy and chronic microvascular ischemic changes. There is no acute intracranial hemorrhage. No mass effect or midline shift. No extra-axial fluid collection. Vascular: No hyperdense vessel or unexpected calcification. Skull: Normal. Negative for fracture or focal lesion. Sinuses/Orbits: No acute finding. Other: None CT CERVICAL SPINE FINDINGS Alignment: No acute subluxation. Skull base and vertebrae: No acute fracture. Soft tissues and spinal canal: No prevertebral fluid or swelling. No visible canal hematoma. Disc levels:  Multilevel degenerative changes. Upper chest: Negative. Other: Bilateral carotid bulb calcified plaques. IMPRESSION: 1. No acute intracranial hemorrhage. 2. No acute/traumatic cervical spine pathology. Electronically Signed   By: Anner Crete M.D.   On: 12/18/2018 19:04   Dg Chest Portable 1 View  Result Date: 12/18/2018 CLINICAL DATA:  AMS EXAM: PORTABLE CHEST 1 VIEW COMPARISON:  Chest radiographs 12/06/2018, 10/22/2018 FINDINGS: Stable cardiomediastinal contours status post median sternotomy. Cardiomegaly. Aortic arch calcification. There is central vascular congestion. There are right basilar opacities favored to represent atelectasis. There is opacification of the left hemidiaphragm which may represent atelectasis, infiltrate not  excluded. No pneumothorax. No acute osseous abnormality in the visualized skeleton. IMPRESSION: 1. Right basilar opacities favored to represent atelectasis. 2. Opacification of the left hemidiaphragm may represent atelectasis, infiltrate not excluded. This could be better assessed with PA and lateral radiographs. 3. Cardiomegaly with central venous congestion. Electronically Signed   By: Audie Pinto M.D.   On: 12/18/2018 18:13   Ct Renal Stone Study  Result Date: 12/22/2018 CLINICAL DATA:  Right flank pain EXAM: CT ABDOMEN AND PELVIS WITHOUT CONTRAST TECHNIQUE: Multidetector CT imaging of the abdomen and pelvis was performed following the standard protocol without IV contrast. COMPARISON:  12/13/2018 FINDINGS: Lower chest: Small bilateral pleural effusions and associated atelectasis or consolidation. Cardiomegaly with extensive 3 vessel coronary artery calcifications and/or stents status post median sternotomy. Aortic valve calcification. Hepatobiliary: No focal liver abnormality is seen. Status post cholecystectomy. No biliary dilatation. Pancreas: Unremarkable. No pancreatic ductal dilatation or surrounding inflammatory changes. Spleen: Normal in size without significant abnormality. Adrenals/Urinary Tract: Adrenal glands are unremarkable. Unchanged 1.1 cm nonobstructive inferior pole calculus of the right kidney. Bladder is unremarkable. Stomach/Bowel: Stomach is within normal limits. Appendix appears normal. No evidence of bowel wall thickening, distention, or inflammatory changes. Pancolonic diverticulosis. Vascular/Lymphatic: Aortic atherosclerosis. No enlarged abdominal or pelvic lymph nodes. Reproductive: No mass or other significant abnormality. Other: Fat containing left inguinal hernia. No abdominopelvic ascites. Musculoskeletal: No acute or significant osseous findings. IMPRESSION: 1. No acute CT findings of the abdomen or pelvis to explain right-sided flank pain. Unchanged 1.1 cm  nonobstructive inferior pole calculus of the right kidney. No evidence of ureteral calculus or hydronephrosis. 2. Small bilateral pleural effusions, new compared to prior examination. 3.  Other chronic and incidental findings as detailed above. Electronically  Signed   By: Eddie Candle M.D.   On: 12/22/2018 21:24     Microbiology: Recent Results (from the past 240 hour(s))  Urine culture     Status: None   Collection Time: 12/18/18  6:20 PM   Specimen: Urine, Clean Catch  Result Value Ref Range Status   Specimen Description   Final    URINE, CLEAN CATCH Performed at [facility], [address], [place], [place] [address]    Special Requests   Final    NONE Performed at [facility] [address] [place], [place] [address]    Culture   Final    NO GROWTH Performed at [facility], [place] [address] [place], [place] [address]    Report Status 12/19/2018 FINAL  Final  SARS Coronavirus 2 [facility] order, Performed in [facility] hospital lab) Nasopharyngeal Nasopharyngeal Swab     Status: None   Collection Time: 12/18/18  7:30 PM   Specimen: Nasopharyngeal Swab  Result Value Ref Range Status   SARS Coronavirus 2 NEGATIVE NEGATIVE Final    Comment: (NOTE) If result is NEGATIVE SARS-CoV-2 target nucleic acids are NOT DETECTED. The SARS-CoV-2 RNA is generally detectable in upper and lower  respiratory specimens during the acute phase of infection. The lowest  concentration of SARS-CoV-2 viral copies this assay can detect is 250  copies / mL. A negative result does not preclude SARS-CoV-2 infection  and should not be used as the sole basis for treatment or other  patient management decisions.  A negative result may occur with  improper specimen collection / handling, submission of specimen other  than nasopharyngeal swab, presence of viral mutation(s) within the  areas targeted by this assay, and inadequate number of viral copies  (<250 copies / mL). A negative result must be  combined with clinical  observations, patient history, and epidemiological information. If result is POSITIVE SARS-CoV-2 target nucleic acids are DETECTED. The SARS-CoV-2 RNA is generally detectable in upper and lower  respiratory specimens dur ing the acute phase of infection.  Positive  results are indicative of active infection with SARS-CoV-2.  Clinical  correlation with patient history and other diagnostic information is  necessary to determine patient infection status.  Positive results do  not rule out bacterial infection or co-infection with other viruses. If result is PRESUMPTIVE POSTIVE SARS-CoV-2 nucleic acids MAY BE PRESENT.   A presumptive positive result was obtained on the submitted specimen  and confirmed on repeat testing.  While 2019 novel coronavirus  (SARS-CoV-2) nucleic acids may be present in the submitted sample  additional confirmatory testing may be necessary for epidemiological  and / or clinical management purposes  to differentiate between  SARS-CoV-2 and other Sarbecovirus currently known to infect humans.  If clinically indicated additional testing with an alternate test  methodology [phone]) is advised. The SARS-CoV-2 RNA is generally  detectable in upper and lower respiratory sp ecimens during the acute  phase of infection. The expected result is Negative. Fact Sheet for Patients:  StrictlyIdeas.no Fact Sheet for Healthcare Providers: BankingDealers.co.za This test is not yet approved or cleared by the [place] FDA and has been authorized for detection and/or diagnosis of SARS-CoV-2 by FDA under an Emergency Use Authorization (EUA).  This EUA will remain in effect (meaning this test can be used) for the duration of the COVID-19 declaration under Section 564(b)(1) of the Act, 21 U.S.C. section 360bbb-3(b)(1), unless the authorization is terminated or revoked sooner. Performed at [facility],  [address] [place], [place] [address]   MRSA PCR Screening     Status: Abnormal   Collection Time: 12/18/18 10:47 PM   Specimen: Nasal Mucosa; Nasopharyngeal  Result Value Ref Range Status   MRSA by PCR POSITIVE (A) NEGATIVE Final    Comment:        The GeneXpert MRSA Assay (FDA approved for NASAL specimens only), is one component of a comprehensive MRSA colonization surveillance program. It is not intended to diagnose MRSA infection nor to guide or monitor treatment for MRSA infections. RESULT CALLED TO, READ BACK BY AND VERIFIED WITH: AMBURN,A @ 0324 ON 12/19/18 BY JUW Performed at [facility], [address] [place], [place] [address]      Labs: Basic Metabolic Panel: Recent Labs  Lab 12/19/18 0429 12/20/18 0515 12/21/18 0558 12/22/18 0031 12/22/18 1028 12/23/18 0430 12/24/18 0658  NA 138 137 137 135  --  136  --   K 3.0* 3.6 4.0 4.0  --  4.0  --   CL 98 99 100 98  --  97*  --   CO2 30 29 29 30   --  29  --   GLUCOSE 183* 211* 175* 136*  --  161*  --   BUN 137* 110* 91* 82*  --  73*  --   CREATININE 2.81* 2.31* 1.92* 1.97*  --  2.00*  --   CALCIUM 8.9 9.1 9.1 8.8*  --  9.0  --   MG  --  1.8  --   --  1.7  --  1.4*   Liver Function Tests: Recent Labs  Lab 12/18/18 1716 12/23/18 1539  AST 12* 16  ALT 10 14  ALKPHOS 60 53  BILITOT 0.3 0.4  PROT 6.9 6.5  ALBUMIN 3.7 3.2*   Recent Labs  Lab 12/23/18 1539  LIPASE 34   Recent Labs  Lab 12/18/18 1717  AMMONIA 24   CBC: Recent Labs  Lab 12/18/18 1716 12/18/18 1740 12/19/18 0429 12/22/18 0031 12/23/18 0430 12/24/18 0658  WBC 7.9  --  7.1 5.7 6.1 6.0  NEUTROABS 5.8  --   --   --   --   --   HGB 11.8* 13.3 11.5* 11.1* 11.1* 11.2*  HCT 38.7* 39.0 37.3* 36.3* 36.1* 36.1*  MCV 93.7  --  94.2 93.6 94.0 93.3  PLT 134*  --  136* 141* 139* 149*   Cardiac Enzymes: No results for input(s): CKTOTAL, CKMB, CKMBINDEX, TROPONINI in the last 168 hours. BNP: Invalid input(s): POCBNP CBG: Recent Labs  Lab  12/23/18 1140 12/23/18 1619 12/23/18 1950 12/23/18 2108 12/24/18 0718  GLUCAP 159* 149* 107* 134* 151*    Time coordinating discharge:  36 minutes  Signed:  Orson Eva, DO [facility] Pager: [phone] 12/24/2018, 10:12 AM

## 2018-12-24 NOTE — Progress Notes (Signed)
IV removed, 2x2 gauze and paper tape applied to site, patient tolerated well.  Report called to Constance Holster, nurse at [facility] in [place], [place] and all questions answered.  Patient awaiting [facility] arrival to transport to facility.  Patient's son, Shariff Corneau, aware of patient being discharged.

## 2018-12-26 DIAGNOSIS — N183 Chronic kidney disease, stage 3 (moderate): Secondary | ICD-10-CM | POA: Diagnosis not present

## 2018-12-26 DIAGNOSIS — G9341 Metabolic encephalopathy: Secondary | ICD-10-CM | POA: Diagnosis not present

## 2018-12-26 DIAGNOSIS — N179 Acute kidney failure, unspecified: Secondary | ICD-10-CM | POA: Diagnosis not present

## 2018-12-26 DIAGNOSIS — I2510 Atherosclerotic heart disease of native coronary artery without angina pectoris: Secondary | ICD-10-CM | POA: Diagnosis not present

## 2018-12-26 LAB — URINE CULTURE

## 2018-12-28 DIAGNOSIS — J9610 Chronic respiratory failure, unspecified whether with hypoxia or hypercapnia: Secondary | ICD-10-CM | POA: Diagnosis not present

## 2018-12-28 DIAGNOSIS — J189 Pneumonia, unspecified organism: Secondary | ICD-10-CM | POA: Diagnosis not present

## 2018-12-28 DIAGNOSIS — N179 Acute kidney failure, unspecified: Secondary | ICD-10-CM | POA: Diagnosis not present

## 2018-12-28 DIAGNOSIS — N183 Chronic kidney disease, stage 3 (moderate): Secondary | ICD-10-CM | POA: Diagnosis not present

## 2018-12-29 DIAGNOSIS — I2510 Atherosclerotic heart disease of native coronary artery without angina pectoris: Secondary | ICD-10-CM | POA: Diagnosis not present

## 2018-12-29 DIAGNOSIS — I5042 Chronic combined systolic (congestive) and diastolic (congestive) heart failure: Secondary | ICD-10-CM | POA: Diagnosis not present

## 2018-12-29 DIAGNOSIS — G9341 Metabolic encephalopathy: Secondary | ICD-10-CM | POA: Diagnosis not present

## 2018-12-29 DIAGNOSIS — N183 Chronic kidney disease, stage 3 (moderate): Secondary | ICD-10-CM | POA: Diagnosis not present

## 2019-01-10 DIAGNOSIS — I5042 Chronic combined systolic (congestive) and diastolic (congestive) heart failure: Secondary | ICD-10-CM | POA: Diagnosis not present

## 2019-01-10 DIAGNOSIS — I130 Hypertensive heart and chronic kidney disease with heart failure and stage 1 through stage 4 chronic kidney disease, or unspecified chronic kidney disease: Secondary | ICD-10-CM | POA: Diagnosis not present

## 2019-01-10 DIAGNOSIS — I2510 Atherosclerotic heart disease of native coronary artery without angina pectoris: Secondary | ICD-10-CM | POA: Diagnosis not present

## 2019-01-10 DIAGNOSIS — J189 Pneumonia, unspecified organism: Secondary | ICD-10-CM | POA: Diagnosis not present

## 2019-01-10 DIAGNOSIS — E1122 Type 2 diabetes mellitus with diabetic chronic kidney disease: Secondary | ICD-10-CM | POA: Diagnosis not present

## 2019-01-10 DIAGNOSIS — N183 Chronic kidney disease, stage 3 (moderate): Secondary | ICD-10-CM | POA: Diagnosis not present

## 2019-01-10 DIAGNOSIS — J9611 Chronic respiratory failure with hypoxia: Secondary | ICD-10-CM | POA: Diagnosis not present

## 2019-01-10 DIAGNOSIS — J440 Chronic obstructive pulmonary disease with acute lower respiratory infection: Secondary | ICD-10-CM | POA: Diagnosis not present

## 2019-01-11 DIAGNOSIS — I10 Essential (primary) hypertension: Secondary | ICD-10-CM | POA: Diagnosis not present

## 2019-01-11 DIAGNOSIS — E1165 Type 2 diabetes mellitus with hyperglycemia: Secondary | ICD-10-CM | POA: Diagnosis not present

## 2019-01-11 DIAGNOSIS — N183 Chronic kidney disease, stage 3 (moderate): Secondary | ICD-10-CM | POA: Diagnosis not present

## 2019-01-11 DIAGNOSIS — J189 Pneumonia, unspecified organism: Secondary | ICD-10-CM | POA: Diagnosis not present

## 2019-01-11 DIAGNOSIS — G9341 Metabolic encephalopathy: Secondary | ICD-10-CM | POA: Diagnosis not present

## 2019-01-11 DIAGNOSIS — I5042 Chronic combined systolic (congestive) and diastolic (congestive) heart failure: Secondary | ICD-10-CM | POA: Diagnosis not present

## 2019-01-13 DIAGNOSIS — I25119 Atherosclerotic heart disease of native coronary artery with unspecified angina pectoris: Secondary | ICD-10-CM | POA: Diagnosis not present

## 2019-01-13 DIAGNOSIS — I5042 Chronic combined systolic (congestive) and diastolic (congestive) heart failure: Secondary | ICD-10-CM | POA: Diagnosis not present

## 2019-01-13 DIAGNOSIS — G9341 Metabolic encephalopathy: Secondary | ICD-10-CM | POA: Diagnosis not present

## 2019-01-13 DIAGNOSIS — N183 Chronic kidney disease, stage 3 unspecified: Secondary | ICD-10-CM | POA: Diagnosis not present

## 2019-01-13 DIAGNOSIS — I252 Old myocardial infarction: Secondary | ICD-10-CM | POA: Diagnosis not present

## 2019-01-13 DIAGNOSIS — J440 Chronic obstructive pulmonary disease with acute lower respiratory infection: Secondary | ICD-10-CM | POA: Diagnosis not present

## 2019-01-13 DIAGNOSIS — F329 Major depressive disorder, single episode, unspecified: Secondary | ICD-10-CM | POA: Diagnosis not present

## 2019-01-13 DIAGNOSIS — J189 Pneumonia, unspecified organism: Secondary | ICD-10-CM | POA: Diagnosis not present

## 2019-01-13 DIAGNOSIS — J9611 Chronic respiratory failure with hypoxia: Secondary | ICD-10-CM | POA: Diagnosis not present

## 2019-01-13 DIAGNOSIS — I130 Hypertensive heart and chronic kidney disease with heart failure and stage 1 through stage 4 chronic kidney disease, or unspecified chronic kidney disease: Secondary | ICD-10-CM | POA: Diagnosis not present

## 2019-01-13 DIAGNOSIS — F410 Panic disorder [episodic paroxysmal anxiety] without agoraphobia: Secondary | ICD-10-CM | POA: Diagnosis not present

## 2019-01-13 DIAGNOSIS — E1122 Type 2 diabetes mellitus with diabetic chronic kidney disease: Secondary | ICD-10-CM | POA: Diagnosis not present

## 2019-01-13 DIAGNOSIS — N179 Acute kidney failure, unspecified: Secondary | ICD-10-CM | POA: Diagnosis not present

## 2019-01-20 DIAGNOSIS — R0902 Hypoxemia: Secondary | ICD-10-CM | POA: Diagnosis not present

## 2019-01-20 DIAGNOSIS — G4733 Obstructive sleep apnea (adult) (pediatric): Secondary | ICD-10-CM | POA: Diagnosis not present

## 2019-02-08 ENCOUNTER — Other Ambulatory Visit: Payer: Self-pay | Admitting: Cardiology

## 2019-02-08 MED ORDER — ISOSORBIDE MONONITRATE ER 120 MG PO TB24: 120.0000 mg | ORAL_TABLET | Freq: Every day | ORAL | 1 refills | Status: DC

## 2019-02-08 NOTE — Telephone Encounter (Signed)
Medication sent to pharmacy  

## 2019-02-08 NOTE — Telephone Encounter (Signed)
° °  1. Which medications need to be refilled? (please list name of each medication and dose if known)  IMDUR 120 mg   2. Which pharmacy/location (including street and city if local pharmacy) is medication to be sent to? [facility], [place]  3. Do they need a 30 day or 90 day supply?   Patient is out of medication

## 2019-02-10 DIAGNOSIS — G9341 Metabolic encephalopathy: Secondary | ICD-10-CM | POA: Diagnosis not present

## 2019-02-10 DIAGNOSIS — Z6841 Body Mass Index (BMI) 40.0 and over, adult: Secondary | ICD-10-CM | POA: Diagnosis not present

## 2019-02-10 DIAGNOSIS — E1165 Type 2 diabetes mellitus with hyperglycemia: Secondary | ICD-10-CM | POA: Diagnosis not present

## 2019-02-10 DIAGNOSIS — I10 Essential (primary) hypertension: Secondary | ICD-10-CM | POA: Diagnosis not present

## 2019-02-10 DIAGNOSIS — J189 Pneumonia, unspecified organism: Secondary | ICD-10-CM | POA: Diagnosis not present

## 2019-02-10 DIAGNOSIS — N183 Chronic kidney disease, stage 3 unspecified: Secondary | ICD-10-CM | POA: Diagnosis not present

## 2019-02-10 DIAGNOSIS — I5033 Acute on chronic diastolic (congestive) heart failure: Secondary | ICD-10-CM | POA: Diagnosis not present

## 2019-02-10 DIAGNOSIS — I2510 Atherosclerotic heart disease of native coronary artery without angina pectoris: Secondary | ICD-10-CM | POA: Diagnosis not present

## 2019-02-10 DIAGNOSIS — E782 Mixed hyperlipidemia: Secondary | ICD-10-CM | POA: Diagnosis not present

## 2019-02-11 DIAGNOSIS — E1165 Type 2 diabetes mellitus with hyperglycemia: Secondary | ICD-10-CM | POA: Diagnosis not present

## 2019-02-11 DIAGNOSIS — G9341 Metabolic encephalopathy: Secondary | ICD-10-CM | POA: Diagnosis not present

## 2019-02-11 DIAGNOSIS — N183 Chronic kidney disease, stage 3 unspecified: Secondary | ICD-10-CM | POA: Diagnosis not present

## 2019-02-11 DIAGNOSIS — Z6841 Body Mass Index (BMI) 40.0 and over, adult: Secondary | ICD-10-CM | POA: Diagnosis not present

## 2019-02-11 DIAGNOSIS — I5033 Acute on chronic diastolic (congestive) heart failure: Secondary | ICD-10-CM | POA: Diagnosis not present

## 2019-02-11 DIAGNOSIS — I2510 Atherosclerotic heart disease of native coronary artery without angina pectoris: Secondary | ICD-10-CM | POA: Diagnosis not present

## 2019-02-11 DIAGNOSIS — J189 Pneumonia, unspecified organism: Secondary | ICD-10-CM | POA: Diagnosis not present

## 2019-02-19 NOTE — Progress Notes (Signed)
Cardiology Office Note  Date: 02/20/2019   ID: [name], DOB May 09, 1933, MRN [id number]  PCP:  [name], MD  Cardiologist:  [name], MD Electrophysiologist:  None   Chief Complaint  Patient presents with  . Cardiac follow-up    History of Present Illness: [name] is an 85 y.o. male last seen in August.  He is here today with his son for a follow-up visit.  He was hospitalized in September with AMS after a fall, no definite syncope.  He was managed for acute metabolic encephalopathy in association with dehydration and uremia.  He had chest discomfort during his hospital stay with high-sensitivity troponin I levels not definitive for type I ACS.  He was managed medically at that time.  He was discharged to a rehabilitation facility and has been back home now for about a month.  He states that he has trouble getting going in the mornings, feels better in the afternoon.  His weight is up compared to September, but less than it had been prior to that.  He remains on Demadex 20 mg daily.  He states that he had recent follow-up lab work with PCP in late October which we are requesting.  I reviewed his medications today as outlined below.  We discussed increasing hydralazine and continuing current diuretic.   Past Medical History:  Diagnosis Date  . Allergic rhinitis   . Anxiety   . Arthritis   . COPD (chronic obstructive pulmonary disease) ([place])   . Coronary atherosclerosis of native coronary artery    a. CABG x 4 in 1994. Previous stent placement to SVG to distal RCA;  b. DES to native Cx in 2006;  c. DES SVG to RCA 05/08/11; d. Inf MI/Cath/PCI: LM 25, LAD 100, LCX patent stent, 50 into OM, RI 60-70, RCA 100, LIMA->LAD nl, VG->Diag 100 old, VG->RCA/PL 99 (DES x 1), EF 50-55%.  . Depression    With anxious features/hx panic attacks  . Essential hypertension, benign   . GERD (gastroesophageal reflux disease)   . Hyperlipidemia    Not on statin 2/2 hx of  intolerance.   . Morbid obesity ([place])   . Nephrolithiasis   . Pneumonia   . Sleep apnea   . ST elevation myocardial infarction (STEMI) of inferior wall ([place])    4/14 - DES SVG to PDA  . Type 2 diabetes mellitus ([place])     Past Surgical History:  Procedure Laterality Date  . CORONARY ARTERY BYPASS GRAFT  1994  . EYE SURGERY    . LEFT HEART CATHETERIZATION WITH CORONARY ANGIOGRAM N/A 05/08/2011   Procedure: LEFT HEART CATHETERIZATION WITH CORONARY ANGIOGRAM;  Surgeon: [name], MD;  Location: [facility] CATH LAB;  Service: Cardiovascular;  Laterality: N/A;  . LEFT HEART CATHETERIZATION WITH CORONARY ANGIOGRAM N/A 08/08/2012   Procedure: LEFT HEART CATHETERIZATION WITH CORONARY ANGIOGRAM;  Surgeon: [name], MD;  Location: [facility] CATH LAB;  Service: Cardiovascular;  Laterality: N/A;  . PERCUTANEOUS CORONARY STENT INTERVENTION (PCI-S) Right 05/08/2011   Procedure: PERCUTANEOUS CORONARY STENT INTERVENTION (PCI-S);  Surgeon: [name], MD;  Location[facility] CATH LAB;  Service: Cardiovascular;  Laterality: Right;  . PERCUTANEOUS CORONARY STENT INTERVENTION (PCI-S) N/A 08/08/2012   Procedure: PERCUTANEOUS CORONARY STENT INTERVENTION (PCI-S);  Surgeon: [name], MD;  Location: [facility] CATH LAB;  Service: Cardiovascular;  Laterality: N/A;    Current Outpatient Medications  Medication Sig Dispense Refill  . acetaminophen (TYLENOL) 325 MG tablet Take 650 mg  by mouth every 4 (four) hours as needed.    Marland Kitchen allopurinol (ZYLOPRIM) 100 MG tablet Take 50 mg by mouth every other day.    . ALPRAZolam (XANAX) 1 MG tablet Take 1 tablet (1 mg total) by mouth 2 (two) times daily as needed for anxiety. 10 tablet 0  . aspirin EC 81 MG tablet Take 81 mg by mouth every morning.    . clopidogrel (PLAVIX) 75 MG tablet TAKE 1 TABLET BY MOUTH EVERY DAY WITH BREAKFAST 90 tablet 1  . Dulaglutide (TRULICITY) A999333 0000000 SOPN Inject into the skin once a week. Every Friday.    . escitalopram (LEXAPRO) 5  MG tablet Take 1 tablet by mouth daily.    . insulin aspart (NOVOLOG) 100 UNIT/ML injection Inject 7 Units into the skin 3 (three) times daily with meals. Give only if eats 50% or more of meal. 10 mL 0  . Insulin Glargine, 2 Unit Dial, (TOUJEO MAX SOLOSTAR) 300 UNIT/ML SOPN Inject 60 Units into the skin at bedtime. 15 mL 0  . isosorbide mononitrate (IMDUR) 120 MG 24 hr tablet Take 1 tablet (120 mg total) by mouth daily. 90 tablet 1  . magnesium oxide (MAG-OX) 400 (241.3 Mg) MG tablet Take 1 tablet (400 mg total) by mouth daily. 30 tablet 1  . metoprolol tartrate (LOPRESSOR) 50 MG tablet TAKE 1 TABLET(50 MG) BY MOUTH TWICE DAILY 60 tablet 11  . nitroGLYCERIN (NITROLINGUAL) 0.4 MG/SPRAY spray Place 1 spray under the tongue every 5 (five) minutes x 3 doses as needed for chest pain. 12 g 0  . ondansetron (ZOFRAN) 4 MG tablet Take 1 tablet (4 mg total) by mouth every 8 (eight) hours as needed for nausea or vomiting. 10 tablet 0  . OXYGEN Inhale 2 L/min into the lungs continuous.    . pantoprazole (PROTONIX) 40 MG tablet Take 1 tablet (40 mg total) by mouth daily. 30 tablet 1  . polyethylene glycol powder (GLYCOLAX/MIRALAX) powder Take 17 g by mouth daily as needed (constipation).     . psyllium (METAMUCIL) 58.6 % powder Take 2 tsp by mouth: Mix with 8 ounces liquid and drink once a day    . tamsulosin (FLOMAX) 0.4 MG CAPS capsule Take 1 capsule (0.4 mg total) by mouth every evening. 30 capsule 0  . torsemide (DEMADEX) 20 MG tablet Take 1 tablet (20 mg total) by mouth daily. Beginning 08/05/2018 30 tablet 1  . UNABLE TO FIND CPAP from home while sleeping, at previous home settings    . hydrALAZINE (APRESOLINE) 10 MG tablet Take 2 tablets (20 mg total) by mouth 3 (three) times daily. 180 tablet 2   No current facility-administered medications for this visit.    Allergies:  Zocor [simvastatin] and Lipitor [atorvastatin]   Social History: The patient  reports that he has never smoked. He has never used  smokeless tobacco. He reports that he does not drink alcohol or use drugs.   ROS:  Please see the history of present illness. Otherwise, complete review of systems is positive for hearing loss, fatigue.  All other systems are reviewed and negative.   Physical Exam: VS:  BP (!) 175/71   Pulse 71   Ht 5\' 5"  (1.651 m)   Wt 256 lb 12.8 oz (116.5 kg)   SpO2 95%   BMI 42.73 kg/m , BMI Body mass index is 42.73 kg/m.  Wt Readings from Last 3 Encounters:  02/20/19 256 lb 12.8 oz (116.5 kg)  12/19/18 247 lb 9.2 oz (112.3 kg)  11/17/18 265 lb (120.2 kg)    General: Elderly male, appears comfortable at rest, using a walker. HEENT: Conjunctiva and lids normal, wearing a mask. Neck: Supple, no elevated JVP or carotid bruits, no thyromegaly. Lungs: Clear to auscultation, nonlabored breathing at rest. Cardiac: Regular rate and rhythm, no S3, soft systolic murmur, no pericardial rub. Abdomen: Protuberant, nontender, bowel sounds present, no guarding or rebound. Extremities: Chronic appearing leg edema, distal pulses 2+. Skin: Warm and dry. Musculoskeletal: No kyphosis. Neuropsychiatric: Alert and oriented x3, affect grossly appropriate.  ECG:  An ECG dated 12/21/2018 was personally reviewed today and demonstrated:  Sinus rhythm with frequent PVCs, old inferior infarct pattern, poor R wave progression, nonspecific ST changes.  Recent Labwork: 10/22/2018: B Natriuretic Peptide 83.0 12/23/2018: ALT 14; AST 16; BUN 73; Creatinine, Ser 2.00; Potassium 4.0; Sodium 136 12/24/2018: Hemoglobin 11.2; Magnesium 1.4; Platelets 149     Component Value Date/Time   CHOL 185 05/25/2014 0549   TRIG 92 05/25/2014 0549   HDL 44 05/25/2014 0549   CHOLHDL 4.2 05/25/2014 0549   VLDL 18 05/25/2014 0549   LDLCALC 123 (H) 05/25/2014 0549    Other Studies Reviewed Today:  Echocardiogram 07/21/2018:  1. Images are limited.  2. The left ventricle had a visually estimated ejection fraction of approximately 40-45%.  Unable to assess focal wall motion. The cavity size was normal. There is moderately increased left ventricular wall thickness. Left ventricular diastolic Doppler  parameters are consistent with impaired relaxation.  3. The cavity was mildly enlarged.  4. The aortic valve is tricuspid. Moderate calcification of the aortic valve. Mild to moderate aortic annular calcification noted. Possible mild aortic stenosis.  5. The mitral valve is grossly normal. There is mild to moderate mitral annular calcification present.  6. The tricuspid valve was grossly normal.  7. The aortic root is normal in size and structure.  Assessment and Plan:  1.  Chronic combined heart failure heart failure.  Echocardiogram from April revealed LVEF 40 to 45%.  We are managing him conservatively at this point.  Current regimen includes metoprolol, hydralazine, Imdur, and Demadex.  He is not on Aldactone with CKD stage IIIb and fluctuating creatinine.  2.  CAD status post CABG with graft disease and subsequent PCI.  No active angina at this time.  Continue dual antiplatelet therapy.  He has history of statin intolerance and has been reluctant to try other options.  3.  CKD stage IIIb, requesting recent follow-up lab work from PCP.  4.  Essential hypertension, blood pressure not optimally controlled.  Increase hydralazine to 20 mg 3 times daily.  Medication Adjustments/Labs and Tests Ordered: Current medicines are reviewed at length with the patient today.  Concerns regarding medicines are outlined above.   Tests Ordered: No orders of the defined types were placed in this encounter.   Medication Changes: Meds ordered this encounter  Medications  . hydrALAZINE (APRESOLINE) 10 MG tablet    Sig: Take 2 tablets (20 mg total) by mouth 3 (three) times daily.    Dispense:  180 tablet    Refill:  2    02/20/2019 dose increase    Disposition:  Follow up 2 months in the [place] office.  Signed, Satira Sark, MD, [facility]  02/20/2019 2:46 PM    [facility] [place], [place], [place] [address] Phone: [phone]; Fax: [phone]

## 2019-02-20 ENCOUNTER — Ambulatory Visit (INDEPENDENT_AMBULATORY_CARE_PROVIDER_SITE_OTHER): Payer: PPO | Admitting: Cardiology

## 2019-02-20 ENCOUNTER — Other Ambulatory Visit: Payer: Self-pay

## 2019-02-20 ENCOUNTER — Encounter: Payer: Self-pay | Admitting: Cardiology

## 2019-02-20 ENCOUNTER — Encounter: Payer: Self-pay | Admitting: *Deleted

## 2019-02-20 VITALS — BP 175/71 | HR 71 | Ht 65.0 in | Wt 256.8 lb

## 2019-02-20 DIAGNOSIS — I25119 Atherosclerotic heart disease of native coronary artery with unspecified angina pectoris: Secondary | ICD-10-CM | POA: Diagnosis not present

## 2019-02-20 DIAGNOSIS — R0902 Hypoxemia: Secondary | ICD-10-CM | POA: Diagnosis not present

## 2019-02-20 DIAGNOSIS — N1832 Chronic kidney disease, stage 3b: Secondary | ICD-10-CM | POA: Diagnosis not present

## 2019-02-20 DIAGNOSIS — I10 Essential (primary) hypertension: Secondary | ICD-10-CM

## 2019-02-20 DIAGNOSIS — G4733 Obstructive sleep apnea (adult) (pediatric): Secondary | ICD-10-CM | POA: Diagnosis not present

## 2019-02-20 DIAGNOSIS — I5042 Chronic combined systolic (congestive) and diastolic (congestive) heart failure: Secondary | ICD-10-CM

## 2019-02-20 MED ORDER — HYDRALAZINE HCL 10 MG PO TABS: 20.0000 mg | ORAL_TABLET | Freq: Three times a day (TID) | ORAL | 2 refills | Status: DC

## 2019-02-20 NOTE — Patient Instructions (Addendum)
Medication Instructions:    Your physician has recommended you make the following change in your medication:   Increase hyralazine to 20 mg three times daily  Continue other medications the same  Labwork:  NONE  Testing/Procedures:  NONE  Follow-Up:  Your physician recommends that you schedule a follow-up appointment in: 2 months.  Any Other Special Instructions Will Be Listed Below (If Applicable).  If you need a refill on your cardiac medications before your next appointment, please call your pharmacy.

## 2019-02-21 ENCOUNTER — Other Ambulatory Visit: Payer: Self-pay | Admitting: Cardiology

## 2019-03-13 DIAGNOSIS — I10 Essential (primary) hypertension: Secondary | ICD-10-CM | POA: Diagnosis not present

## 2019-03-13 DIAGNOSIS — E782 Mixed hyperlipidemia: Secondary | ICD-10-CM | POA: Diagnosis not present

## 2019-03-22 DIAGNOSIS — G4733 Obstructive sleep apnea (adult) (pediatric): Secondary | ICD-10-CM | POA: Diagnosis not present

## 2019-03-22 DIAGNOSIS — R0902 Hypoxemia: Secondary | ICD-10-CM | POA: Diagnosis not present

## 2019-03-31 ENCOUNTER — Inpatient Hospital Stay (HOSPITAL_COMMUNITY): Payer: PPO

## 2019-03-31 ENCOUNTER — Emergency Department (HOSPITAL_COMMUNITY): Payer: PPO

## 2019-03-31 ENCOUNTER — Other Ambulatory Visit: Payer: Self-pay

## 2019-03-31 ENCOUNTER — Encounter (HOSPITAL_COMMUNITY): Payer: Self-pay | Admitting: Student

## 2019-03-31 DIAGNOSIS — D649 Anemia, unspecified: Secondary | ICD-10-CM | POA: Diagnosis present

## 2019-03-31 DIAGNOSIS — I2510 Atherosclerotic heart disease of native coronary artery without angina pectoris: Secondary | ICD-10-CM | POA: Diagnosis present

## 2019-03-31 DIAGNOSIS — G4733 Obstructive sleep apnea (adult) (pediatric): Secondary | ICD-10-CM | POA: Diagnosis present

## 2019-03-31 DIAGNOSIS — E1122 Type 2 diabetes mellitus with diabetic chronic kidney disease: Secondary | ICD-10-CM | POA: Diagnosis present

## 2019-03-31 DIAGNOSIS — I130 Hypertensive heart and chronic kidney disease with heart failure and stage 1 through stage 4 chronic kidney disease, or unspecified chronic kidney disease: Principal | ICD-10-CM | POA: Diagnosis present

## 2019-03-31 DIAGNOSIS — E1121 Type 2 diabetes mellitus with diabetic nephropathy: Secondary | ICD-10-CM | POA: Diagnosis not present

## 2019-03-31 DIAGNOSIS — Z7189 Other specified counseling: Secondary | ICD-10-CM

## 2019-03-31 DIAGNOSIS — Z794 Long term (current) use of insulin: Secondary | ICD-10-CM | POA: Diagnosis not present

## 2019-03-31 DIAGNOSIS — J9601 Acute respiratory failure with hypoxia: Secondary | ICD-10-CM | POA: Diagnosis not present

## 2019-03-31 DIAGNOSIS — I25810 Atherosclerosis of coronary artery bypass graft(s) without angina pectoris: Secondary | ICD-10-CM | POA: Diagnosis not present

## 2019-03-31 DIAGNOSIS — Z6841 Body Mass Index (BMI) 40.0 and over, adult: Secondary | ICD-10-CM

## 2019-03-31 DIAGNOSIS — N401 Enlarged prostate with lower urinary tract symptoms: Secondary | ICD-10-CM | POA: Diagnosis not present

## 2019-03-31 DIAGNOSIS — R0602 Shortness of breath: Secondary | ICD-10-CM | POA: Diagnosis not present

## 2019-03-31 DIAGNOSIS — E782 Mixed hyperlipidemia: Secondary | ICD-10-CM | POA: Diagnosis not present

## 2019-03-31 DIAGNOSIS — R778 Other specified abnormalities of plasma proteins: Secondary | ICD-10-CM | POA: Diagnosis present

## 2019-03-31 DIAGNOSIS — I509 Heart failure, unspecified: Secondary | ICD-10-CM | POA: Diagnosis not present

## 2019-03-31 DIAGNOSIS — G4730 Sleep apnea, unspecified: Secondary | ICD-10-CM | POA: Diagnosis present

## 2019-03-31 DIAGNOSIS — Z7902 Long term (current) use of antithrombotics/antiplatelets: Secondary | ICD-10-CM

## 2019-03-31 DIAGNOSIS — Z515 Encounter for palliative care: Secondary | ICD-10-CM | POA: Diagnosis not present

## 2019-03-31 DIAGNOSIS — I25110 Atherosclerotic heart disease of native coronary artery with unstable angina pectoris: Secondary | ICD-10-CM | POA: Diagnosis present

## 2019-03-31 DIAGNOSIS — Z66 Do not resuscitate: Secondary | ICD-10-CM | POA: Diagnosis present

## 2019-03-31 DIAGNOSIS — I110 Hypertensive heart disease with heart failure: Secondary | ICD-10-CM | POA: Diagnosis not present

## 2019-03-31 DIAGNOSIS — E871 Hypo-osmolality and hyponatremia: Secondary | ICD-10-CM | POA: Diagnosis not present

## 2019-03-31 DIAGNOSIS — R609 Edema, unspecified: Secondary | ICD-10-CM | POA: Diagnosis not present

## 2019-03-31 DIAGNOSIS — I5043 Acute on chronic combined systolic (congestive) and diastolic (congestive) heart failure: Secondary | ICD-10-CM

## 2019-03-31 DIAGNOSIS — I5023 Acute on chronic systolic (congestive) heart failure: Secondary | ICD-10-CM | POA: Diagnosis not present

## 2019-03-31 DIAGNOSIS — N1831 Chronic kidney disease, stage 3a: Secondary | ICD-10-CM | POA: Diagnosis not present

## 2019-03-31 DIAGNOSIS — Z9981 Dependence on supplemental oxygen: Secondary | ICD-10-CM | POA: Diagnosis not present

## 2019-03-31 DIAGNOSIS — I10 Essential (primary) hypertension: Secondary | ICD-10-CM | POA: Diagnosis present

## 2019-03-31 DIAGNOSIS — J9621 Acute and chronic respiratory failure with hypoxia: Secondary | ICD-10-CM | POA: Diagnosis not present

## 2019-03-31 DIAGNOSIS — J810 Acute pulmonary edema: Secondary | ICD-10-CM | POA: Diagnosis not present

## 2019-03-31 DIAGNOSIS — N1832 Chronic kidney disease, stage 3b: Secondary | ICD-10-CM | POA: Diagnosis not present

## 2019-03-31 DIAGNOSIS — J449 Chronic obstructive pulmonary disease, unspecified: Secondary | ICD-10-CM | POA: Diagnosis present

## 2019-03-31 DIAGNOSIS — I214 Non-ST elevation (NSTEMI) myocardial infarction: Secondary | ICD-10-CM | POA: Diagnosis not present

## 2019-03-31 DIAGNOSIS — Z8249 Family history of ischemic heart disease and other diseases of the circulatory system: Secondary | ICD-10-CM | POA: Diagnosis not present

## 2019-03-31 DIAGNOSIS — Z955 Presence of coronary angioplasty implant and graft: Secondary | ICD-10-CM

## 2019-03-31 DIAGNOSIS — Z79899 Other long term (current) drug therapy: Secondary | ICD-10-CM

## 2019-03-31 DIAGNOSIS — I252 Old myocardial infarction: Secondary | ICD-10-CM

## 2019-03-31 DIAGNOSIS — E1165 Type 2 diabetes mellitus with hyperglycemia: Secondary | ICD-10-CM | POA: Diagnosis not present

## 2019-03-31 DIAGNOSIS — Z9989 Dependence on other enabling machines and devices: Secondary | ICD-10-CM | POA: Diagnosis not present

## 2019-03-31 DIAGNOSIS — N200 Calculus of kidney: Secondary | ICD-10-CM | POA: Diagnosis not present

## 2019-03-31 DIAGNOSIS — N183 Chronic kidney disease, stage 3 unspecified: Secondary | ICD-10-CM | POA: Diagnosis present

## 2019-03-31 DIAGNOSIS — J90 Pleural effusion, not elsewhere classified: Secondary | ICD-10-CM

## 2019-03-31 DIAGNOSIS — Z20828 Contact with and (suspected) exposure to other viral communicable diseases: Secondary | ICD-10-CM | POA: Diagnosis not present

## 2019-03-31 DIAGNOSIS — E785 Hyperlipidemia, unspecified: Secondary | ICD-10-CM | POA: Diagnosis present

## 2019-03-31 DIAGNOSIS — N179 Acute kidney failure, unspecified: Secondary | ICD-10-CM | POA: Diagnosis not present

## 2019-03-31 DIAGNOSIS — E875 Hyperkalemia: Secondary | ICD-10-CM | POA: Diagnosis present

## 2019-03-31 DIAGNOSIS — I5042 Chronic combined systolic (congestive) and diastolic (congestive) heart failure: Secondary | ICD-10-CM | POA: Diagnosis present

## 2019-03-31 DIAGNOSIS — R000 Tachycardia, unspecified: Secondary | ICD-10-CM | POA: Diagnosis not present

## 2019-03-31 DIAGNOSIS — R0689 Other abnormalities of breathing: Secondary | ICD-10-CM | POA: Diagnosis not present

## 2019-03-31 DIAGNOSIS — Z7982 Long term (current) use of aspirin: Secondary | ICD-10-CM | POA: Diagnosis not present

## 2019-03-31 DIAGNOSIS — J9600 Acute respiratory failure, unspecified whether with hypoxia or hypercapnia: Secondary | ICD-10-CM | POA: Diagnosis not present

## 2019-03-31 DIAGNOSIS — E860 Dehydration: Secondary | ICD-10-CM | POA: Diagnosis present

## 2019-03-31 DIAGNOSIS — F410 Panic disorder [episodic paroxysmal anxiety] without agoraphobia: Secondary | ICD-10-CM | POA: Diagnosis present

## 2019-03-31 DIAGNOSIS — K219 Gastro-esophageal reflux disease without esophagitis: Secondary | ICD-10-CM | POA: Diagnosis present

## 2019-03-31 DIAGNOSIS — I2119 ST elevation (STEMI) myocardial infarction involving other coronary artery of inferior wall: Secondary | ICD-10-CM | POA: Diagnosis not present

## 2019-03-31 DIAGNOSIS — I4901 Ventricular fibrillation: Secondary | ICD-10-CM | POA: Diagnosis not present

## 2019-03-31 LAB — URINALYSIS, ROUTINE W REFLEX MICROSCOPIC
Bilirubin Urine: NEGATIVE
Glucose, UA: NEGATIVE mg/dL
Ketones, ur: NEGATIVE mg/dL
Leukocytes,Ua: NEGATIVE
Nitrite: NEGATIVE
Protein, ur: 100 mg/dL — AB
Specific Gravity, Urine: 1.011 (ref 1.005–1.030)
pH: 5 (ref 5.0–8.0)

## 2019-03-31 LAB — ECHOCARDIOGRAM COMPLETE
Height: 65 in
Weight: 4043.2 oz

## 2019-03-31 LAB — CBC WITH DIFFERENTIAL/PLATELET
Abs Immature Granulocytes: 0.05 10*3/uL (ref 0.00–0.07)
Basophils Absolute: 0 10*3/uL (ref 0.0–0.1)
Basophils Relative: 0 %
Eosinophils Absolute: 0 10*3/uL (ref 0.0–0.5)
Eosinophils Relative: 0 %
HCT: 38.3 % — ABNORMAL LOW (ref 39.0–52.0)
Hemoglobin: 11.7 g/dL — ABNORMAL LOW (ref 13.0–17.0)
Immature Granulocytes: 1 %
Lymphocytes Relative: 11 %
Lymphs Abs: 1.1 10*3/uL (ref 0.7–4.0)
MCH: 28.7 pg (ref 26.0–34.0)
MCHC: 30.5 g/dL (ref 30.0–36.0)
MCV: 93.9 fL (ref 80.0–100.0)
Monocytes Absolute: 1.3 10*3/uL — ABNORMAL HIGH (ref 0.1–1.0)
Monocytes Relative: 12 %
Neutro Abs: 8 10*3/uL — ABNORMAL HIGH (ref 1.7–7.7)
Neutrophils Relative %: 76 %
Platelets: 193 10*3/uL (ref 150–400)
RBC: 4.08 MIL/uL — ABNORMAL LOW (ref 4.22–5.81)
RDW: 14.6 % (ref 11.5–15.5)
WBC: 10.5 10*3/uL (ref 4.0–10.5)
nRBC: 0 % (ref 0.0–0.2)

## 2019-03-31 LAB — COMPREHENSIVE METABOLIC PANEL
ALT: 29 U/L (ref 0–44)
AST: 26 U/L (ref 15–41)
Albumin: 3.6 g/dL (ref 3.5–5.0)
Alkaline Phosphatase: 61 U/L (ref 38–126)
Anion gap: 8 (ref 5–15)
BUN: 40 mg/dL — ABNORMAL HIGH (ref 8–23)
CO2: 26 mmol/L (ref 22–32)
Calcium: 8.6 mg/dL — ABNORMAL LOW (ref 8.9–10.3)
Chloride: 101 mmol/L (ref 98–111)
Creatinine, Ser: 2.08 mg/dL — ABNORMAL HIGH (ref 0.61–1.24)
GFR calc Af Amer: 33 mL/min — ABNORMAL LOW (ref >60)
GFR calc non Af Amer: 29 mL/min — ABNORMAL LOW (ref >60)
Glucose, Bld: 226 mg/dL — ABNORMAL HIGH (ref 70–99)
Potassium: 4.8 mmol/L (ref 3.5–5.1)
Sodium: 135 mmol/L (ref 135–145)
Total Bilirubin: 0.6 mg/dL (ref 0.3–1.2)
Total Protein: 6.9 g/dL (ref 6.5–8.1)

## 2019-03-31 LAB — BASIC METABOLIC PANEL
Anion gap: 10 (ref 5–15)
BUN: 40 mg/dL — ABNORMAL HIGH (ref 8–23)
CO2: 26 mmol/L (ref 22–32)
Calcium: 8.6 mg/dL — ABNORMAL LOW (ref 8.9–10.3)
Chloride: 100 mmol/L (ref 98–111)
Creatinine, Ser: 2.11 mg/dL — ABNORMAL HIGH (ref 0.61–1.24)
GFR calc Af Amer: 33 mL/min — ABNORMAL LOW (ref >60)
GFR calc non Af Amer: 28 mL/min — ABNORMAL LOW (ref >60)
Glucose, Bld: 231 mg/dL — ABNORMAL HIGH (ref 70–99)
Potassium: 4.7 mmol/L (ref 3.5–5.1)
Sodium: 136 mmol/L (ref 135–145)

## 2019-03-31 LAB — TROPONIN I (HIGH SENSITIVITY)
Troponin I (High Sensitivity): 920 ng/L (ref <18)
Troponin I (High Sensitivity): 1049 ng/L (ref <18)
Troponin I (High Sensitivity): 1519 ng/L (ref <18)

## 2019-03-31 LAB — CBG MONITORING, ED
Glucose-Capillary: 213 mg/dL — ABNORMAL HIGH (ref 70–99)
Glucose-Capillary: 213 mg/dL — ABNORMAL HIGH (ref 70–99)

## 2019-03-31 LAB — POC SARS CORONAVIRUS 2 AG -  ED: SARS Coronavirus 2 Ag: NEGATIVE

## 2019-03-31 LAB — BRAIN NATRIURETIC PEPTIDE: B Natriuretic Peptide: 1086 pg/mL — ABNORMAL HIGH (ref 0.0–100.0)

## 2019-03-31 LAB — RESPIRATORY PANEL BY RT PCR (FLU A&B, COVID)
Influenza A by PCR: NEGATIVE
Influenza B by PCR: NEGATIVE
SARS Coronavirus 2 by RT PCR: NEGATIVE

## 2019-03-31 LAB — LIPASE, BLOOD: Lipase: 28 U/L (ref 11–51)

## 2019-03-31 LAB — SARS CORONAVIRUS 2 (TAT 6-24 HRS): SARS Coronavirus 2: NEGATIVE

## 2019-03-31 MED ORDER — CITALOPRAM HYDROBROMIDE 20 MG PO TABS
20.0000 mg | ORAL_TABLET | Freq: Every day | ORAL | Status: DC
  Administered 2019-04-01 – 2019-04-04 (×4): 20 mg via ORAL
  Filled 2019-03-31 (×4): qty 1

## 2019-03-31 MED ORDER — ISOSORBIDE MONONITRATE ER 60 MG PO TB24
120.0000 mg | ORAL_TABLET | Freq: Every day | ORAL | Status: DC
  Administered 2019-04-01 – 2019-04-05 (×5): 120 mg via ORAL
  Filled 2019-03-31 (×5): qty 2

## 2019-03-31 MED ORDER — HEPARIN (PORCINE) 25000 UT/250ML-% IV SOLN
1350.0000 [IU]/h | INTRAVENOUS | Status: DC
  Administered 2019-03-31: 1100 [IU]/h via INTRAVENOUS
  Administered 2019-04-01 – 2019-04-02 (×2): 1250 [IU]/h via INTRAVENOUS
  Filled 2019-03-31 (×3): qty 250

## 2019-03-31 MED ORDER — ONDANSETRON HCL 4 MG PO TABS: 4.0000 mg | ORAL_TABLET | Freq: Four times a day (QID) | ORAL | Status: DC | PRN

## 2019-03-31 MED ORDER — HEPARIN SODIUM (PORCINE) 5000 UNIT/ML IJ SOLN
5000.0000 [IU] | Freq: Three times a day (TID) | INTRAMUSCULAR | Status: DC
  Administered 2019-03-31: 18:00:00 5000 [IU] via SUBCUTANEOUS
  Filled 2019-03-31: qty 1

## 2019-03-31 MED ORDER — PERFLUTREN LIPID MICROSPHERE
1.0000 mL | INTRAVENOUS | Status: AC | PRN
  Administered 2019-03-31: 2 mL via INTRAVENOUS
  Administered 2019-03-31: 1 mL via INTRAVENOUS
  Filled 2019-03-31: qty 10

## 2019-03-31 MED ORDER — ALLOPURINOL 100 MG PO TABS
50.0000 mg | ORAL_TABLET | ORAL | Status: DC
  Administered 2019-04-02 – 2019-04-04 (×2): 50 mg via ORAL
  Filled 2019-03-31 (×3): qty 1

## 2019-03-31 MED ORDER — INSULIN ASPART 100 UNIT/ML ~~LOC~~ SOLN
0.0000 [IU] | Freq: Every day | SUBCUTANEOUS | Status: DC
  Administered 2019-03-31: 22:00:00 2 [IU] via SUBCUTANEOUS
  Filled 2019-03-31: qty 1

## 2019-03-31 MED ORDER — FUROSEMIDE 10 MG/ML IJ SOLN
60.0000 mg | Freq: Once | INTRAMUSCULAR | Status: DC
  Filled 2019-03-31: qty 6

## 2019-03-31 MED ORDER — FENTANYL CITRATE (PF) 100 MCG/2ML IJ SOLN
50.0000 ug | Freq: Once | INTRAMUSCULAR | Status: AC
  Administered 2019-03-31: 50 ug via INTRAVENOUS
  Filled 2019-03-31: qty 2

## 2019-03-31 MED ORDER — INSULIN GLARGINE 100 UNIT/ML ~~LOC~~ SOLN
60.0000 [IU] | Freq: Every day | SUBCUTANEOUS | Status: DC
  Administered 2019-03-31 – 2019-04-01 (×2): 60 [IU] via SUBCUTANEOUS
  Filled 2019-03-31 (×5): qty 0.6

## 2019-03-31 MED ORDER — INSULIN ASPART 100 UNIT/ML ~~LOC~~ SOLN
0.0000 [IU] | Freq: Three times a day (TID) | SUBCUTANEOUS | Status: DC
  Administered 2019-04-01 – 2019-04-04 (×4): 1 [IU] via SUBCUTANEOUS
  Administered 2019-04-05: 12:00:00 2 [IU] via SUBCUTANEOUS

## 2019-03-31 MED ORDER — ONDANSETRON HCL 4 MG PO TABS: 4.0000 mg | ORAL_TABLET | Freq: Three times a day (TID) | ORAL | Status: DC | PRN

## 2019-03-31 MED ORDER — TAMSULOSIN HCL 0.4 MG PO CAPS
0.4000 mg | ORAL_CAPSULE | Freq: Every evening | ORAL | Status: DC
  Administered 2019-03-31 – 2019-04-04 (×5): 0.4 mg via ORAL
  Filled 2019-03-31 (×5): qty 1

## 2019-03-31 MED ORDER — ASPIRIN 81 MG PO CHEW
324.0000 mg | CHEWABLE_TABLET | Freq: Once | ORAL | Status: AC
  Administered 2019-03-31: 324 mg via ORAL
  Filled 2019-03-31: qty 4

## 2019-03-31 MED ORDER — ASPIRIN EC 81 MG PO TBEC
81.0000 mg | DELAYED_RELEASE_TABLET | Freq: Every morning | ORAL | Status: DC
  Administered 2019-04-01 – 2019-04-05 (×5): 81 mg via ORAL
  Filled 2019-03-31 (×5): qty 1

## 2019-03-31 MED ORDER — MAGNESIUM OXIDE 400 (241.3 MG) MG PO TABS
400.0000 mg | ORAL_TABLET | Freq: Every day | ORAL | Status: DC
  Administered 2019-03-31 – 2019-04-05 (×6): 400 mg via ORAL
  Filled 2019-03-31 (×6): qty 1

## 2019-03-31 MED ORDER — FUROSEMIDE 10 MG/ML IJ SOLN
40.0000 mg | Freq: Once | INTRAMUSCULAR | Status: AC
  Administered 2019-03-31: 11:00:00 40 mg via INTRAVENOUS
  Filled 2019-03-31: qty 4

## 2019-03-31 MED ORDER — SODIUM CHLORIDE 0.9 % IV SOLN
250.0000 mL | INTRAVENOUS | Status: DC | PRN
  Administered 2019-04-03 – 2019-04-05 (×3): 250 mL via INTRAVENOUS

## 2019-03-31 MED ORDER — FUROSEMIDE 10 MG/ML IJ SOLN
60.0000 mg | Freq: Once | INTRAMUSCULAR | Status: AC
  Administered 2019-03-31: 60 mg via INTRAVENOUS

## 2019-03-31 MED ORDER — ALPRAZOLAM 0.5 MG PO TABS
1.0000 mg | ORAL_TABLET | Freq: Two times a day (BID) | ORAL | Status: DC | PRN
  Administered 2019-04-01 – 2019-04-04 (×7): 1 mg via ORAL
  Filled 2019-03-31 (×7): qty 2

## 2019-03-31 MED ORDER — PANTOPRAZOLE SODIUM 40 MG PO TBEC
40.0000 mg | DELAYED_RELEASE_TABLET | Freq: Every day | ORAL | Status: DC
  Administered 2019-03-31 – 2019-04-05 (×6): 40 mg via ORAL
  Filled 2019-03-31 (×6): qty 1

## 2019-03-31 MED ORDER — POTASSIUM CHLORIDE CRYS ER 20 MEQ PO TBCR
20.0000 meq | EXTENDED_RELEASE_TABLET | Freq: Once | ORAL | Status: AC
  Administered 2019-03-31: 20 meq via ORAL
  Filled 2019-03-31: qty 1

## 2019-03-31 MED ORDER — INSULIN ASPART 100 UNIT/ML ~~LOC~~ SOLN: 2.0000 [IU] | Freq: Three times a day (TID) | SUBCUTANEOUS | Status: DC

## 2019-03-31 MED ORDER — NITROGLYCERIN 0.4 MG SL SUBL: 0.4000 mg | SUBLINGUAL_TABLET | SUBLINGUAL | Status: DC | PRN

## 2019-03-31 MED ORDER — AEROCHAMBER PLUS FLO-VU MEDIUM MISC
1.0000 | Freq: Once | Status: AC
  Administered 2019-03-31: 10:00:00 1

## 2019-03-31 MED ORDER — SODIUM CHLORIDE 0.9% FLUSH
3.0000 mL | Freq: Two times a day (BID) | INTRAVENOUS | Status: DC
  Administered 2019-03-31 – 2019-04-05 (×8): 3 mL via INTRAVENOUS

## 2019-03-31 MED ORDER — ALBUTEROL SULFATE (2.5 MG/3ML) 0.083% IN NEBU: 2.5000 mg | INHALATION_SOLUTION | RESPIRATORY_TRACT | Status: DC | PRN

## 2019-03-31 MED ORDER — ACETAMINOPHEN 650 MG RE SUPP: 650.0000 mg | Freq: Four times a day (QID) | RECTAL | Status: DC | PRN

## 2019-03-31 MED ORDER — POLYETHYLENE GLYCOL 3350 17 G PO PACK: 17.0000 g | PACK | Freq: Every day | ORAL | Status: DC | PRN

## 2019-03-31 MED ORDER — ALBUTEROL SULFATE HFA 108 (90 BASE) MCG/ACT IN AERS
2.0000 | INHALATION_SPRAY | Freq: Once | RESPIRATORY_TRACT | Status: AC
  Administered 2019-03-31: 2 via RESPIRATORY_TRACT
  Filled 2019-03-31: qty 6.7

## 2019-03-31 MED ORDER — FUROSEMIDE 10 MG/ML IJ SOLN
40.0000 mg | Freq: Two times a day (BID) | INTRAMUSCULAR | Status: DC
  Administered 2019-03-31 – 2019-04-01 (×2): 40 mg via INTRAVENOUS
  Filled 2019-03-31 (×2): qty 4

## 2019-03-31 MED ORDER — METOPROLOL TARTRATE 50 MG PO TABS
50.0000 mg | ORAL_TABLET | Freq: Two times a day (BID) | ORAL | Status: DC
  Administered 2019-04-01 – 2019-04-05 (×8): 50 mg via ORAL
  Filled 2019-03-31 (×8): qty 1

## 2019-03-31 MED ORDER — CLOPIDOGREL BISULFATE 75 MG PO TABS
75.0000 mg | ORAL_TABLET | Freq: Every day | ORAL | Status: DC
  Administered 2019-03-31 – 2019-04-05 (×6): 75 mg via ORAL
  Filled 2019-03-31 (×7): qty 1

## 2019-03-31 MED ORDER — HYDRALAZINE HCL 50 MG PO TABS
50.0000 mg | ORAL_TABLET | Freq: Three times a day (TID) | ORAL | Status: DC
  Administered 2019-03-31 – 2019-04-01 (×2): 50 mg via ORAL
  Filled 2019-03-31: qty 1
  Filled 2019-03-31: qty 2
  Filled 2019-03-31: qty 1

## 2019-03-31 MED ORDER — ACETAMINOPHEN 325 MG PO TABS
650.0000 mg | ORAL_TABLET | Freq: Four times a day (QID) | ORAL | Status: DC | PRN
  Administered 2019-04-01: 650 mg via ORAL
  Filled 2019-03-31: qty 2

## 2019-03-31 MED ORDER — SODIUM CHLORIDE 0.9% FLUSH: 3.0000 mL | INTRAVENOUS | Status: DC | PRN

## 2019-03-31 MED ORDER — ALBUTEROL SULFATE HFA 108 (90 BASE) MCG/ACT IN AERS
6.0000 | INHALATION_SPRAY | Freq: Once | RESPIRATORY_TRACT | Status: DC
  Filled 2019-03-31: qty 6.7

## 2019-03-31 MED ORDER — ONDANSETRON HCL 4 MG/2ML IJ SOLN
4.0000 mg | Freq: Four times a day (QID) | INTRAMUSCULAR | Status: DC | PRN
  Administered 2019-04-01 (×2): 4 mg via INTRAVENOUS
  Filled 2019-03-31 (×2): qty 2

## 2019-03-31 MED ORDER — TRAZODONE HCL 50 MG PO TABS
50.0000 mg | ORAL_TABLET | Freq: Every evening | ORAL | Status: DC | PRN
  Filled 2019-03-31: qty 1

## 2019-03-31 MED ORDER — OXYCODONE HCL 5 MG PO TABS
5.0000 mg | ORAL_TABLET | Freq: Once | ORAL | Status: AC
  Administered 2019-03-31: 5 mg via ORAL
  Filled 2019-03-31: qty 1

## 2019-03-31 MED ORDER — SODIUM CHLORIDE 0.9 % IV SOLN
1.0000 g | INTRAVENOUS | Status: DC
  Administered 2019-03-31 – 2019-04-01 (×2): 1 g via INTRAVENOUS
  Filled 2019-03-31 (×2): qty 10

## 2019-03-31 NOTE — ED Provider Notes (Signed)
[facility] EMERGENCY DEPARTMENT Provider Note   CSN: [id number] Arrival date & time: 04/08/2019  [id number]     History Chief Complaint  Patient presents with  . Respiratory Distress    [name] is a 85 y.o. male with a hx of CAD, COPD, CHF last EF 40-45%, T2DM, obesity, HTN, hyperlipidemia, sleep apnea, & CKD who presents to the emergency department via EMS for shortness of breath over the past 1 week.  Patient states he feels constantly short of breath, worse in the supine position and with ambulation, no alleviating factors.  He has had associated productive cough with white phlegm sputum, leg swelling, and some abdominal discomfort.  Denies fever, chest pain, dizziness, syncope, nausea, vomiting, or diarrhea.  Per EMS patient was saturating 82% on room air, placed on 15 L then decreased to 6 L with SPO2 maintaining in the 90s.  HPI     Past Medical History:  Diagnosis Date  . Allergic rhinitis   . Anxiety   . Arthritis   . COPD (chronic obstructive pulmonary disease) ([place])   . Coronary atherosclerosis of native coronary artery    a. CABG x 4 in 1994. Previous stent placement to SVG to distal RCA;  b. DES to native Cx in 2006;  c. DES SVG to RCA 05/08/11; d. Inf MI/Cath/PCI: LM 25, LAD 100, LCX patent stent, 50 into OM, RI 60-70, RCA 100, LIMA->LAD nl, VG->Diag 100 old, VG->RCA/PL 99 (DES x 1), EF 50-55%.  . Depression    With anxious features/hx panic attacks  . Essential hypertension, benign   . GERD (gastroesophageal reflux disease)   . Hyperlipidemia    Not on statin 2/2 hx of intolerance.   . Morbid obesity ([place])   . Nephrolithiasis   . Pneumonia   . Sleep apnea   . ST elevation myocardial infarction (STEMI) of inferior wall ([place])    4/14 - DES SVG to PDA  . Type 2 diabetes mellitus [facility])     Patient Active Problem List   Diagnosis Date Noted  . Dehydration 12/18/2018  . Acute metabolic encephalopathy 123456  . Acute renal failure superimposed on stage 3  chronic kidney disease ([facility]) 12/18/2018  . AKI (acute kidney injury) ([place])   . Bilateral lower extremity edema   . HCAP (healthcare-associated pneumonia) 10/22/2018  . Pressure injury of skin 10/22/2018  . E. coli UTI 08/04/2018  . CKD stage 3 due to type 2 diabetes mellitus ([place]) 07/27/2018  . Chronic constipation 07/27/2018  . COPD (chronic obstructive pulmonary disease) ([place]) 07/27/2018  . BPH without urinary obstruction 07/27/2018  . Chronic gout due to renal impairment without tophus 07/27/2018  . Depression with anxiety 07/27/2018  . Mild aortic stenosis 07/27/2018  . Unstable angina ([place]) 07/22/2018  . Demand ischemia ([place])   . Secondary cardiomyopathy ([place])   . Chest pain 07/21/2018  . Elevated troponin 07/21/2018  . Acute on chronic diastolic heart failure ([place])   . CKD stage 3 secondary to diabetes ([place]) 01/01/2014  . Chronic combined systolic and diastolic CHF (congestive heart failure) ([place]) 08/11/2012  . CAD (coronary artery disease) of artery bypass graft 08/08/2012  . Sleep apnea   . Essential hypertension, benign   . Obesity, Class III, BMI 40-49.9 (morbid obesity) ([place])   . Hyperlipidemia   . Type 2 diabetes mellitus with stage 3 chronic kidney disease, with long-term current use of insulin ([place])   . Coronary artery disease involving native coronary artery of native heart with unstable angina  pectoris [facility])     Past Surgical History:  Procedure Laterality Date  . CORONARY ARTERY BYPASS GRAFT  1994  . EYE SURGERY    . LEFT HEART CATHETERIZATION WITH CORONARY ANGIOGRAM N/A 05/08/2011   Procedure: LEFT HEART CATHETERIZATION WITH CORONARY ANGIOGRAM;  Surgeon: [name], MD;  Location: [facility] CATH LAB;  Service: Cardiovascular;  Laterality: N/A;  . LEFT HEART CATHETERIZATION WITH CORONARY ANGIOGRAM N/A 08/08/2012   Procedure: LEFT HEART CATHETERIZATION WITH CORONARY ANGIOGRAM;  Surgeon: [name], MD;  Location: [facility] CATH LAB;  Service: Cardiovascular;   Laterality: N/A;  . PERCUTANEOUS CORONARY STENT INTERVENTION (PCI-S) Right 05/08/2011   Procedure: PERCUTANEOUS CORONARY STENT INTERVENTION (PCI-S);  Surgeon: [name], MD;  Location: [facility] CATH LAB;  Service: Cardiovascular;  Laterality: Right;  . PERCUTANEOUS CORONARY STENT INTERVENTION (PCI-S) N/A 08/08/2012   Procedure: PERCUTANEOUS CORONARY STENT INTERVENTION (PCI-S);  Surgeon: [name], MD;  Location: [facility] CATH LAB;  Service: Cardiovascular;  Laterality: N/A;       Family History  Problem Relation Age of Onset  . Heart attack Mother        Age 24    Social History   Tobacco Use  . Smoking status: Never Smoker  . Smokeless tobacco: Never Used  Substance Use Topics  . Alcohol use: No    Alcohol/week: 0.0 standard drinks  . Drug use: No    Home Medications Prior to Admission medications   Medication Sig Start Date End Date Taking? Authorizing Provider  acetaminophen (TYLENOL) 325 MG tablet Take 650 mg by mouth every 4 (four) hours as needed.    [provider]  allopurinol (ZYLOPRIM) 100 MG tablet Take 50 mg by mouth every other day.    [provider]  ALPRAZolam Duanne Moron) 1 MG tablet Take 1 tablet (1 mg total) by mouth 2 (two) times daily as needed for anxiety. 12/24/18   Orson Eva, MD  aspirin EC 81 MG tablet Take 81 mg by mouth every morning.    [provider]  clopidogrel (PLAVIX) 75 MG tablet TAKE 1 TABLET BY MOUTH EVERY DAY WITH BREAKFAST 11/28/18   Satira Sark, MD  Dulaglutide (TRULICITY) A999333 0000000 SOPN Inject into the skin once a week. Every Friday.    [provider]  escitalopram (LEXAPRO) 5 MG tablet Take 1 tablet by mouth daily. 11/29/18   [provider]  hydrALAZINE (APRESOLINE) 10 MG tablet Take 2 tablets (20 mg total) by mouth 3 (three) times daily. 02/20/19   Satira Sark, MD  insulin aspart (NOVOLOG) 100 UNIT/ML injection Inject 7 Units into the skin 3 (three) times daily with meals.  Give only if eats 50% or more of meal. 08/17/18   Nyoka Cowden, Phylis Bougie, NP  Insulin Glargine, 2 Unit Dial, (TOUJEO MAX SOLOSTAR) 300 UNIT/ML SOPN Inject 60 Units into the skin at bedtime. 08/17/18   Gerlene Fee, NP  isosorbide mononitrate (IMDUR) 120 MG 24 hr tablet Take 1 tablet (120 mg total) by mouth daily. 02/08/19   Satira Sark, MD  magnesium oxide (MAG-OX) 400 (241.3 Mg) MG tablet Take 1 tablet (400 mg total) by mouth daily. 12/25/18   Orson Eva, MD  metoprolol tartrate (LOPRESSOR) 50 MG tablet TAKE 1 TABLET(50 MG) BY MOUTH TWICE DAILY 10/19/18   Satira Sark, MD  nitroGLYCERIN (NITROLINGUAL) 0.4 MG/SPRAY spray Place 1 spray under the tongue every 5 (five) minutes x 3 doses as needed for chest pain. 08/17/18   Gerlene Fee, NP  ondansetron (ZOFRAN) 4 MG tablet Take 1 tablet (4 mg total) by mouth every 8 (eight) hours as needed for nausea or vomiting. 08/17/18   Gerlene Fee, NP  OXYGEN Inhale 2 L/min into the lungs continuous.    [provider]  pantoprazole (PROTONIX) 40 MG tablet Take 1 tablet (40 mg total) by mouth daily. 12/24/18   Orson Eva, MD  polyethylene glycol powder (GLYCOLAX/MIRALAX) powder Take 17 g by mouth daily as needed (constipation).  02/08/13   [provider]  psyllium (METAMUCIL) 58.6 % powder Take 2 tsp by mouth: Mix with 8 ounces liquid and drink once a day    [provider]  tamsulosin (FLOMAX) 0.4 MG CAPS capsule Take 1 capsule (0.4 mg total) by mouth every evening. 08/17/18   Gerlene Fee, NP  torsemide (DEMADEX) 20 MG tablet Take 1 tablet (20 mg total) by mouth daily. Beginning 08/05/2018 10/26/18   Barton Dubois, MD  UNABLE TO FIND CPAP from home while sleeping, at previous home settings    [provider]    Allergies    Zocor [simvastatin] and Lipitor [atorvastatin]  Review of Systems   Review of Systems  Constitutional: Negative for diaphoresis and fever.  Respiratory: Positive for cough and shortness of  breath.   Cardiovascular: Positive for leg swelling. Negative for chest pain.  Gastrointestinal: Negative for abdominal pain, diarrhea, nausea and vomiting.  Neurological: Negative for syncope.  All other systems reviewed and are negative.   Physical Exam Updated Vital Signs There were no vitals taken for this visit.  Physical Exam Vitals and nursing note reviewed.  Constitutional:      Appearance: He is well-developed. He is ill-appearing.     Comments: Patient is somewhat pale appearing  HENT:     Head: Normocephalic and atraumatic.  Eyes:     General:        Right eye: No discharge.        Left eye: No discharge.  Cardiovascular:     Rate and Rhythm: Normal rate and regular rhythm.  Pulmonary:     Comments: SPO2 84% on room air upon my assessment of the patient, he was placed on 6 L via nasal cannula with improvement to 91 to 92% however still appeared tachypneic, was transitioned to 10 L nonrebreather for comfort with SPO2 of 94%. Patient has coarse breath sounds throughout, bibasilar rales, transmitted upper airway sounds present, expiratory wheeze noted at the bases. Abdominal:     General: There is no distension.     Palpations: Abdomen is soft.     Tenderness: There is no abdominal tenderness. There is no guarding or rebound.  Musculoskeletal:     Cervical back: Neck supple.     Comments: Patient has 2-3+ symmetric pitting edema to the lower legs without overlying erythema/warmth.  Skin:    General: Skin is warm and dry.     Findings: No rash.  Neurological:     Mental Status: He is alert.     Comments: Clear speech.   Psychiatric:        Behavior: Behavior normal.     ED Results / Procedures / Treatments   Labs (all labs ordered are listed, but only abnormal results are displayed) Labs Reviewed  COMPREHENSIVE METABOLIC PANEL - Abnormal; Notable for the following components:      Result Value   Glucose, Bld 226 (*)    BUN 40 (*)    Creatinine, Ser 2.08 (*)     Calcium  8.6 (*)    GFR calc non Af Amer 29 (*)    GFR calc Af Amer 33 (*)    All other components within normal limits  CBC WITH DIFFERENTIAL/PLATELET - Abnormal; Notable for the following components:   RBC 4.08 (*)    Hemoglobin 11.7 (*)    HCT 38.3 (*)    Neutro Abs 8.0 (*)    Monocytes Absolute 1.3 (*)    All other components within normal limits  BRAIN NATRIURETIC PEPTIDE - Abnormal; Notable for the following components:   B Natriuretic Peptide 1,086.0 (*)    All other components within normal limits  URINALYSIS, ROUTINE W REFLEX MICROSCOPIC - Abnormal; Notable for the following components:   APPearance CLOUDY (*)    Hgb urine dipstick MODERATE (*)    Protein, ur 100 (*)    Bacteria, UA RARE (*)    All other components within normal limits  TROPONIN I (HIGH SENSITIVITY) - Abnormal; Notable for the following components:   Troponin I (High Sensitivity) 920 (*)    All other components within normal limits  URINE CULTURE  LIPASE, BLOOD  POC SARS CORONAVIRUS 2 AG -  ED    EKG EKG Interpretation  Date/Time:  Friday March 31 2019 09:22:48 EST Ventricular Rate:  92 PR Interval:    QRS Duration: 109 QT Interval:  349 QTC Calculation: 432 R Axis:   117 Text Interpretation: Sinus rhythm Prolonged PR interval Left posterior fascicular block Inferior infarct, old Consider anterior infarct Confirmed by Elnora Morrison [phone]) on 04/01/2019 3:24:43 PM   Radiology DG Chest Port 1 View  Result Date: 04/13/2019 CLINICAL DATA:  Dyspnea, shortness of breath EXAM: PORTABLE CHEST 1 VIEW COMPARISON:  12/18/2018 FINDINGS: Bilateral interstitial and alveolar airspace opacities. Bilateral small pleural effusions. No pneumothorax. Stable cardiomegaly. Prior CABG. No acute osseous abnormality. IMPRESSION: Bilateral interstitial and alveolar airspace opacities. Differential considerations include pulmonary edema versus multilobar pneumonia. Electronically Signed   By: Kathreen Devoid   On:  03/15/2019 10:13    Procedures .Critical Care Performed by: Amaryllis Dyke, PA-C Authorized by: Amaryllis Dyke, PA-C    CRITICAL CARE Performed by: Kennith Maes   Total critical care time: 30 minutes  Critical care time was exclusive of separately billable procedures and treating other patients.  Critical care was necessary to treat or prevent imminent or life-threatening deterioration.  Critical care was time spent personally by me on the following activities: development of treatment plan with patient and/or surrogate as well as nursing, discussions with consultants, evaluation of patient's response to treatment, examination of patient, obtaining history from patient or surrogate, ordering and performing treatments and interventions, ordering and review of laboratory studies, ordering and review of radiographic studies, pulse oximetry and re-evaluation of patient's condition.    (including critical care time)  Medications Ordered in ED Medications  furosemide (LASIX) injection 40 mg (has no administration in time range)  potassium chloride SA (KLOR-CON) CR tablet 20 mEq (has no administration in time range)  aspirin chewable tablet 324 mg (has no administration in time range)  albuterol (VENTOLIN HFA) 108 (90 Base) MCG/ACT inhaler 2 puff (2 puffs Inhalation Given 03/15/2019 0949)  AeroChamber Plus Flo-Vu Medium MISC 1 each (1 each Other Given 04/03/2019 0949)  fentaNYL (SUBLIMAZE) injection 50 mcg (50 mcg Intravenous Given 03/29/2019 0948)    ED Course  I have reviewed the triage vital signs and the nursing notes.  Pertinent labs & imaging results that were available during my care of the patient  were reviewed by me and considered in my medical decision making (see chart for details).    MDM Rules/Calculators/A&P                      Patient presents to the ED via EMS for increased work of breathing over the past 1 week.  On arrival noted to be tachypneic  and hypoxic.  Currently on 10 L nonrebreather with improvement of his respirations and SPO2.  Coarse breath sounds, expiratory wheeze, symmetric pitting edema to the lower extremities.  He also mentions some abdominal discomfort, abdomen is nontender without peritoneal signs.  Feel CHF exacerbation is most likely, additional DDX: COPD exacerbation, atypical ACS, COVID-19, bacterial pneumonia, PE, critical anemia.  Plan for labs, EKG, chest x-ray.  Albuterol ordered for mild wheezing, fentanyl ordered for pain.  CBC: No leukocytosis.  Anemia similar to prior. CMP: Renal function consistent with prior labs on record.  Hyperglycemia without acidosis or anion gap elevation.  No significant electrolyte derangement.  LFTs WNL. Lipase: WNL Urinalysis: Hematuria, no obvious infection. BNP: Elevated at 1086 High-sensitivity troponin: Elevated somewhat more from prior at 920. EKG: No STEMI  CXR: Bilateral interstitial and alveolar airspace opacities. Differential considerations include pulmonary edema versus multilobar pneumonia. Rapid covid: Negative  Most concern for CHF exacerbation with demand likely leading to elevated troponin-  - given his peripheral edema with elevated BNP feel CXR findings more likely represent edema as opposed to pneumonia. Lasix ordered.  - given his elevated troponin will give aspirin, however again seems more demand related without chest pain, ekg without STEMI.   Considering BiPAP, patient would like to avoid currently, continue 10L NRB for comfort, will consult for admission.   Findings and plan of care discussed with supervising physician Dr. Reather Converse who is in agreement.   10:36: CONSULT: Discussed with hospitalist Dr. Roxan Hockey- accepts admission.   Dustine Weisner Kawamoto was evaluated in Emergency Department on 03/24/2019 for the symptoms described in the history of present illness. He/she was evaluated in the context of the global COVID-19 pandemic, which necessitated  consideration that the patient might be at risk for infection with the SARS-CoV-2 virus that causes COVID-19. Institutional protocols and algorithms that pertain to the evaluation of patients at risk for COVID-19 are in a state of rapid change based on information released by regulatory bodies including the CDC and federal and state organizations. These policies and algorithms were followed during the patient's care in the ED.   Final Clinical Impression(s) / ED Diagnoses Final diagnoses:  Acute on chronic congestive heart failure, unspecified heart failure type ([place])  Acute respiratory failure with hypoxia [facility])    Rx / DC Orders ED Discharge Orders    None       Amaryllis Dyke, PA-C 04/06/2019 1540    Elnora Morrison, MD 04/01/19 [phone]

## 2019-03-31 NOTE — H&P (Addendum)
Patient Demographics:    [name], is a 85 y.o. male  MRN: [id number]   DOB - 1934/03/13  Admit Date - 03/26/2019  Outpatient Primary MD for the patient is Rosalee Kaufman, PA-C   Assessment & Plan:    Principal Problem:   Acute on chronic HFrEF (heart failure with reduced ejection fraction) ([place]) Active Problems:   Coronary artery disease involving native coronary artery of native heart with unstable angina pectoris (HCC)   CAD (coronary artery disease) of artery bypass graft   Elevated troponin   CKD stage 3 secondary to diabetes ([place])   Sleep apnea   Essential hypertension, benign   Obesity, Class III, BMI 40-49.9 (morbid obesity) ([place])   Type 2 diabetes mellitus with stage 3 chronic kidney disease, with long-term current use of insulin (HCC)   Chronic combined systolic and diastolic CHF (congestive heart failure) (HCC)   Acute exacerbation of CHF (congestive heart failure) ([place])      In ED echo with - FINDINGS  Left Ventricle: Left ventricular ejection fraction, by visual estimation, is 30 to 35%. The left ventricle has moderate to severely decreased function.  left ventricle demonstrates regional wall motion abnormalities.  Left ventricular diastolic parameters are consistent with Grade II diastolic dysfunction (pseudonormalization).   LV Wall Scoring: The apical septal segment, mid inferolateral segment, apical inferior segment, and apex are akinetic. The entire anterior wall, basal and mid anterolateral wall, basal and mid anterior septum, basal and mid inferior septum, basal and mid inferior wall, basal inferolateral segment, and apical lateral segment are hypokinetic.   -Given drop in EF on echo (from 40-45 now ~30 to 35 %), new finding of regional wall motion normalities, worsening  dyspnea with worsening hypoxia and elevated troponin as well as elevated BNP Case discussed with on-call cardiologist Dr. Warner Mccreedy advised transfer to [place] for further cardiovascular evaluation    A/p 1)HFrEF--  acute on chronic combined systolic and diastolic CHF exacerbation in the setting of possible ACS -Troponin elevated above 1000, patient with significant dyspnea and worsening hypoxia -Echo with EF down to 30 to 35% from recent baseline of 40 to 45% in 07/2018, regional wall motion normalities noted on echo today -Discussed with on-call cardiologist Dr. Jenkins Rouge -diuresis with IV Lasix -Start IV heparin -Transfer to [place] as there is no cardiology coverage at [facility] over the weekend -Please recall cardiology service in a.m. for more formal cardiology consult for this patient -Continue generalizing/isosorbide combo -Consider switching from metoprolol tartrate to Coreg -Daily weight and fluid input and output monitoring   2)H/o CAD/Now with NSTEMI--- CABG x 4 in 1994 with subsequent stent placement to SVG to distal RCA;  b. DES to native Cx in 2006;  c. DES SVG to RCA 05/08/11; d. Inf MI/Cath/PCI: LM 25, LAD 100, LCX patent stent, 50 into OM, RI 60-70, RCA 100, LIMA->LAD nl, VG->Diag 100 old, VG->RCA/PL 99 (DES x 1) --  h/o Statin intolerance -Possible ACS/NSTEMI given echocardiogram findings, troponin and dyspnea (angina equivalent) discussed with Dr. Jenkins Rouge from cardiology service,  -will start IV heparin, - continue aspirin and Plavix -Patient is a poor candidate for LHC and invasive procedure due to multiple comorbidities factors including CKD IIIb -Continue isosorbide,  -Consider switching from metoprolol tartrate to Coreg  3)acute on chronic hypoxic respiratory failure due to COPD and CHF----PTA patient used oxygen at 2 L continuously via nasal cannula at home but mostly at night -Worsening oxygen requirement at this time is due to  CHF exacerbation in the setting of possible ACS -Currently requiring high flow oxygen, okay to transition to BiPAP/CPAP once Covid test is available  4)CKD IIIb-AKI---   creatinine on admission=2.08  ,   baseline creatinine = 2.0    , renal function currently at baseline,   , renally adjust medications, avoid nephrotoxic agents / dehydration /hypotension -Monitor renal function closely with aggressive diuresis  5)Morbid obesity/OSA--- CPAP nightly (if Covid negative)  6)DM2-last A1c 8.8 reflecting uncontrolled diabetes, give Lantus insulin 60 units nightly along with sliding scale coverage  7)Social/Ethics--plan of care and CODE STATUS discussed with patient, he is a DNR/DNI, patient has a universal DNR form at bedside in the ED -Okay to use BiPAP but patient _tried to reach son at [phone]-- left message  Was able to Discuss with Son Jefferie Meyering (POA)- [phone]   8)BPH with LUTs and possible UTI--IV Rocephin for possible UTI pending culture results, Flomax for BPH   Reviewed by me  Past Medical History:  Diagnosis Date  . Allergic rhinitis   . Anxiety   . Arthritis   . COPD (chronic obstructive pulmonary disease) ([place])   . Coronary atherosclerosis of native coronary artery    a. CABG x 4 in 1994. Previous stent placement to SVG to distal RCA;  b. DES to native Cx in 2006;  c. DES SVG to RCA 05/08/11; d. Inf MI/Cath/PCI: LM 25, LAD 100, LCX patent stent, 50 into OM, RI 60-70, RCA 100, LIMA->LAD nl, VG->Diag 100 old, VG->RCA/PL 99 (DES x 1), EF 50-55%.  . Depression    With anxious features/hx panic attacks  . Essential hypertension, benign   . GERD (gastroesophageal reflux disease)   . Hyperlipidemia    Not on statin 2/2 hx of intolerance.   . Morbid obesity ([place])   . Nephrolithiasis   . Pneumonia   . Sleep apnea   . ST elevation myocardial infarction (STEMI) of inferior wall ([place])    4/14 - DES SVG to PDA  . Type 2 diabetes mellitus ([place])       Past Surgical  History:  Procedure Laterality Date  . CORONARY ARTERY BYPASS GRAFT  1994  . EYE SURGERY    . LEFT HEART CATHETERIZATION WITH CORONARY ANGIOGRAM N/A 05/08/2011   Procedure: LEFT HEART CATHETERIZATION WITH CORONARY ANGIOGRAM;  Surgeon: [name], MD;  Location: [facility] CATH LAB;  Service: Cardiovascular;  Laterality: N/A;  . LEFT HEART CATHETERIZATION WITH CORONARY ANGIOGRAM N/A 08/08/2012   Procedure: LEFT HEART CATHETERIZATION WITH CORONARY ANGIOGRAM;  Surgeon: [name], MD;  Location: [facility] CATH LAB;  Service: Cardiovascular;  Laterality: N/A;  . PERCUTANEOUS CORONARY STENT INTERVENTION (PCI-S) Right 05/08/2011   Procedure: PERCUTANEOUS CORONARY STENT INTERVENTION (PCI-S);  Surgeon: [name], MD;  Location: [facility] CATH LAB;  Service: Cardiovascular;  Laterality: Right;  . PERCUTANEOUS CORONARY STENT INTERVENTION (PCI-S) N/A 08/08/2012   Procedure: PERCUTANEOUS CORONARY STENT INTERVENTION (PCI-S);  Surgeon: Annita Brod  Angelena Form, MD;  Location: [facility] CATH LAB;  Service: Cardiovascular;  Laterality: N/A;      Chief Complaint  Patient presents with  . Respiratory Distress      HPI:    Oryon Bruggeman  is a 85 y.o. male  with a hx of CAD (prior CABG in 1994), COPD, CHF last EF 40-45%, T2DM, obesity, HTN, hyperlipidemia, sleep apnea, & CKD IIIb presents from home by EMS with worsening shortness of breath  -Per EMS, sat 82% on arrival started on 15L and then bumped to 6L sat now 96%. CBG 226. Pt c/o SOB since last week states called EMS cause he wasn't getting better. Temp 100.0 per EMS.   While in the ED--- Pt had pulled O2 off sat dropped to 72%, non rebreather replaced sat increased to 96%  -in ED--clinically/auscultative the patient appeared volume overloaded, very hypoxic with increased work of breathing, required high flow oxygen, IV Lasix given with good urine output -Chest x-ray findings more consistent with CHF than pneumonia -CBC with a white count of 10.5 H&H is 11.7  and 32.3 with platelets of 193 -Creatinine is 2.0 which is patient's baseline, electrolytes are unremarkable ,LFTs are unremarkable -UA is inconclusive from a possible UTI standpoint ,urine culture pending -Lipase is not elevated -BNP is elevated at 1086 which is much higher than patient's previous baseline -Troponins elevated over 1009 in the setting of CKD -Point-of-care Covid is negative ,Covid PCR pending  - _tried to reach son at [phone]-- left message  Was able to Discuss with Son Dalynn Farag- (POA)-- [phone]  - In ED echo with - FINDINGS  Left Ventricle: Left ventricular ejection fraction, by visual estimation, is 30 to 35%. The left ventricle has moderate to severely decreased function.  left ventricle demonstrates regional wall motion abnormalities.  Left ventricular diastolic parameters are consistent with Grade II diastolic dysfunction (pseudonormalization).   LV Wall Scoring: The apical septal segment, mid inferolateral segment, apical inferior segment, and apex are akinetic. The entire anterior wall, basal and mid anterolateral wall, basal and mid anterior septum, basal and mid inferior septum, basal and mid inferior wall, basal inferolateral segment, and apical lateral segment are hypokinetic.  --Given drop in EF on echo (from 40-45 now ~30 to 35 %), new finding of regional wall motion normalities, worsening dyspnea with worsening hypoxia and elevated troponin as well as elevated BNP Case discussed with on-call cardiologist Dr. Warner Mccreedy advised transfer to [place] for further cardiovascular evaluation -IV heparin initiated    Review of systems:    In addition to the HPI above,   A full Review of  Systems was done, all other systems reviewed are negative except as noted above in HPI , .    Social History:  Reviewed by me    Social History   Tobacco Use  . Smoking status: Never Smoker  . Smokeless tobacco: Never Used  Substance Use  Topics  . Alcohol use: No    Alcohol/week: 0.0 standard drinks       Family History :  Reviewed by me    Family History  Problem Relation Age of Onset  . Heart attack Mother        Age 30    Home Medications:   Prior to Admission medications   Medication Sig Start Date End Date Taking? Authorizing Provider  acetaminophen (TYLENOL) 325 MG tablet Take 650 mg by mouth every 4 (four) hours as needed.    [provider]  allopurinol (ZYLOPRIM)  100 MG tablet Take 50 mg by mouth every other day.    [provider]  ALPRAZolam Duanne Moron) 1 MG tablet Take 1 tablet (1 mg total) by mouth 2 (two) times daily as needed for anxiety. 12/24/18   Orson Eva, MD  aspirin EC 81 MG tablet Take 81 mg by mouth every morning.    [provider]  clopidogrel (PLAVIX) 75 MG tablet TAKE 1 TABLET BY MOUTH EVERY DAY WITH BREAKFAST 11/28/18   Satira Sark, MD  Dulaglutide (TRULICITY) [id number] [id number] SOPN Inject into the skin once a week. Every Friday.    [provider]  escitalopram (LEXAPRO) 5 MG tablet Take 1 tablet by mouth daily. 11/29/18   [provider]  hydrALAZINE (APRESOLINE) 10 MG tablet Take 2 tablets (20 mg total) by mouth 3 (three) times daily. 02/20/19   Satira Sark, MD  insulin aspart (NOVOLOG) 100 UNIT/ML injection Inject 7 Units into the skin 3 (three) times daily with meals. Give only if eats 50% or more of meal. 08/17/18   Nyoka Cowden, Phylis Bougie, NP  Insulin Glargine, 2 Unit Dial, (TOUJEO MAX SOLOSTAR) 300 UNIT/ML SOPN Inject 60 Units into the skin at bedtime. 08/17/18   Gerlene Fee, NP  isosorbide mononitrate (IMDUR) 120 MG 24 hr tablet Take 1 tablet (120 mg total) by mouth daily. 02/08/19   Satira Sark, MD  magnesium oxide (MAG-OX) 400 (241.3 Mg) MG tablet Take 1 tablet (400 mg total) by mouth daily. 12/25/18   Orson Eva, MD  metoprolol tartrate (LOPRESSOR) 50 MG tablet TAKE 1 TABLET(50 MG) BY MOUTH TWICE DAILY 10/19/18   Satira Sark,  MD  nitroGLYCERIN (NITROLINGUAL) 0.4 MG/SPRAY spray Place 1 spray under the tongue every 5 (five) minutes x 3 doses as needed for chest pain. 08/17/18   Gerlene Fee, NP  ondansetron (ZOFRAN) 4 MG tablet Take 1 tablet (4 mg total) by mouth every 8 (eight) hours as needed for nausea or vomiting. 08/17/18   Gerlene Fee, NP  OXYGEN Inhale 2 L/min into the lungs continuous.    [provider]  pantoprazole (PROTONIX) 40 MG tablet Take 1 tablet (40 mg total) by mouth daily. 12/24/18   Orson Eva, MD  polyethylene glycol powder (GLYCOLAX/MIRALAX) powder Take 17 g by mouth daily as needed (constipation).  02/08/13   [provider]  psyllium (METAMUCIL) 58.6 % powder Take 2 tsp by mouth: Mix with 8 ounces liquid and drink once a day    [provider]  tamsulosin (FLOMAX) 0.4 MG CAPS capsule Take 1 capsule (0.4 mg total) by mouth every evening. 08/17/18   Gerlene Fee, NP  torsemide (DEMADEX) 20 MG tablet Take 1 tablet (20 mg total) by mouth daily. Beginning 08/05/2018 10/26/18   Barton Dubois, MD  UNABLE TO FIND CPAP from home while sleeping, at previous home settings    [provider]     Allergies:     Allergies  Allergen Reactions  . Zocor [Simvastatin] Other (See Comments)    fatigue  . Lipitor [Atorvastatin] Other (See Comments)    Muscle cramping     Physical Exam:   Vitals  Blood pressure (!) 141/76, pulse 96, temperature 98.3 F (36.8 C), temperature source Oral, resp. rate (!) 36, height 5\' 5"  (1.651 m), weight 114.6 kg, SpO2 95 %.  Physical Examination: General appearance - alert, respiratory distress, unable to speak in sentences  mental status - alert, oriented to person, place, and time,  Nose-high flow  oxygen Eyes - sclera anicteric Neck - supple, +ve JVD elevation , Chest -diminished in bases with bibasilar rales Heart - S1 and S2 normal, regular , prior CABG scar Abdomen - soft, nontender, nondistended, increased truncal  adiposity  neurological - screening mental status exam normal, neck supple without rigidity, cranial nerves II through XII intact, DTR's normal and symmetric Extremities -2+ pedal edema noted, intact peripheral pulses Skin - warm, dry     Data Review:    CBC Recent Labs  Lab 03/26/2019 0935  WBC 10.5  HGB 11.7*  HCT 38.3*  PLT 193  MCV 93.9  MCH 28.7  MCHC 30.5  RDW 14.6  LYMPHSABS 1.1  MONOABS 1.3*  EOSABS 0.0  BASOSABS 0.0   ------------------------------------------------------------------------------------------------------------------  Chemistries  Recent Labs  Lab 03/22/2019 0935 03/25/2019 1120  NA 135 136  K 4.8 4.7  CL 101 100  CO2 26 26  GLUCOSE 226* 231*  BUN 40* 40*  CREATININE 2.08* 2.11*  CALCIUM 8.6* 8.6*  AST 26  --   ALT 29  --   ALKPHOS 61  --   BILITOT 0.6  --    ------------------------------------------------------------------------------------------------------------------ estimated creatinine clearance is 31 mL/min (A) (by C-G formula based on SCr of 2.11 mg/dL (H)). ------------------------------------------------------------------------------------------------------------------ No results for input(s): TSH, T4TOTAL, T3FREE, THYROIDAB in the last 72 hours.  Invalid input(s): FREET3   Coagulation profile No results for input(s): INR, PROTIME in the last 168 hours. ------------------------------------------------------------------------------------------------------------------- No results for input(s): DDIMER in the last 72 hours. -------------------------------------------------------------------------------------------------------------------  Cardiac Enzymes No results for input(s): CKMB, TROPONINI, MYOGLOBIN in the last 168 hours.  Invalid input(s): CK ------------------------------------------------------------------------------------------------------------------    Component Value Date/Time   BNP 1,086.0 (H) 03/14/2019 0935      ---------------------------------------------------------------------------------------------------------------  Urinalysis    Component Value Date/Time   COLORURINE YELLOW 03/16/2019 0934   APPEARANCEUR CLOUDY (A) 04/11/2019 0934   LABSPEC 1.011 04/01/2019 0934   PHURINE 5.0 03/29/2019 0934   GLUCOSEU NEGATIVE 03/28/2019 0934   HGBUR MODERATE (A) 03/19/2019 0934   BILIRUBINUR NEGATIVE 04/12/2019 [place] 03/24/2019 0934   PROTEINUR 100 (A) 04/01/2019 0934   NITRITE NEGATIVE 04/04/2019 0934   LEUKOCYTESUR NEGATIVE 03/14/2019 0934    ----------------------------------------------------------------------------------------------------------------   Imaging Results:    DG Chest Port 1 View  Result Date: 03/25/2019 CLINICAL DATA:  Dyspnea, shortness of breath EXAM: PORTABLE CHEST 1 VIEW COMPARISON:  12/18/2018 FINDINGS: Bilateral interstitial and alveolar airspace opacities. Bilateral small pleural effusions. No pneumothorax. Stable cardiomegaly. Prior CABG. No acute osseous abnormality. IMPRESSION: Bilateral interstitial and alveolar airspace opacities. Differential considerations include pulmonary edema versus multilobar pneumonia. Electronically Signed   By: Kathreen Devoid   On: 03/22/2019 10:13   ECHOCARDIOGRAM COMPLETE  Result Date: 04/13/2019   ECHOCARDIOGRAM REPORT   Patient Name:   CEON GRISHABER Date of Exam: 04/04/2019 Medical Rec #:  [id number]        Height:       65.0 in Accession #:    [id number]       Weight:       252.0 lb Date of Birth:  1934-03-04        BSA:          2.18 m Patient Age:    73 years         BP:           136/77 mmHg Patient Gender: M                HR:  91 bpm. Exam Location:  [facility] Procedure: 2D Echo, Cardiac Doppler and Color Doppler Indications:    Dyspnea 786.09 / R06.00  History:        Patient has prior history of Echocardiogram examinations, most                 recent 07/21/2018. CAD and Previous Myocardial  Infarction; Risk                 Factors:Hypertension, Diabetes and Dyslipidemia. Acute on                 chronic diastolic heart failure, Sleep apnea.  Sonographer:    Alvino Chapel RCS Referring Phys: [phone] [facility]  Sonographer Comments: In airborne precautions FINDINGS  Left Ventricle: Left ventricular ejection fraction, by visual estimation, is 30 to 35%. The left ventricle has moderate to severely decreased function. Definity contrast agent was given IV to delineate the left ventricular endocardial borders. The left ventricle demonstrates regional wall motion abnormalities. The left ventricular internal cavity size was the left ventricle is normal in size. There is moderately increased left ventricular hypertrophy. Left ventricular diastolic parameters are consistent with Grade II diastolic dysfunction (pseudonormalization).  LV Wall Scoring: The apical septal segment, mid inferolateral segment, apical inferior segment, and apex are akinetic. The entire anterior wall, basal and mid anterolateral wall, basal and mid anterior septum, basal and mid inferior septum, basal and mid inferior wall, basal inferolateral segment, and apical lateral segment are hypokinetic. Right Ventricle: The right ventricular size is normal. No increase in right ventricular wall thickness. Global RV systolic function is has normal systolic function. The tricuspid regurgitant velocity is 2.80 m/s, and with an assumed right atrial pressure  of 15 mmHg, the estimated right ventricular systolic pressure is moderately elevated at 46.4 mmHg. Left Atrium: Left atrial size was mild-moderately dilated. Right Atrium: Right atrial size was normal in size Pericardium: There is no evidence of pericardial effusion. Mitral Valve: The mitral valve is degenerative in appearance. There is mild calcification of the mitral valve leaflet(s). Mild mitral annular calcification. Trivial mitral valve regurgitation. Tricuspid Valve: The tricuspid valve  is grossly normal. Tricuspid valve regurgitation is trivial. Aortic Valve: The aortic valve is tricuspid. Aortic valve regurgitation is not visualized. Mild to moderate aortic valve annular calcification. Pulmonic Valve: The pulmonic valve was grossly normal. Pulmonic valve regurgitation is trivial. Pulmonic regurgitation is trivial. Aorta: The aortic root is normal in size and structure. Venous: The inferior vena cava is dilated in size with less than 50% respiratory variability, suggesting right atrial pressure of 15 mmHg. IAS/Shunts: No atrial level shunt detected by color flow Doppler.  LEFT VENTRICLE PLAX 2D LVIDd:         5.01 cm LVIDs:         3.95 cm LV PW:         1.42 cm LV IVS:        1.44 cm LVOT diam:     2.10 cm LV SV:         51 ml LV SV Index:   21.65 LVOT Area:     3.46 cm  LEFT ATRIUM           Index LA diam:      4.50 cm 2.06 cm/m LA Vol (A2C): 91.0 ml 41.70 ml/m   AORTA Ao Root diam: 3.40 cm MV E velocity: 102.00 cm/s  103 cm/s  TRICUSPID VALVE MV A velocity: 7570.00 cm/s 70.3 cm/s TV Peak grad:  313600.0 mmHg MV E/A ratio:  0.01         1.5       TV Vmax:        280.00 m/s                                       TR Peak grad:   31.4 mmHg                                       TR Vmax:        280.00 cm/s                                        SHUNTS                                       Systemic Diam: 2.10 cm  [name] MD Electronically signed by [name] MD Signature Date/Time: 04/08/2019/3:51:02 PM    Final     Radiological Exams on Admission: DG Chest Port 1 View  Result Date: 03/19/2019 CLINICAL DATA:  Dyspnea, shortness of breath EXAM: PORTABLE CHEST 1 VIEW COMPARISON:  12/18/2018 FINDINGS: Bilateral interstitial and alveolar airspace opacities. Bilateral small pleural effusions. No pneumothorax. Stable cardiomegaly. Prior CABG. No acute osseous abnormality. IMPRESSION: Bilateral interstitial and alveolar airspace opacities. Differential considerations include pulmonary  edema versus multilobar pneumonia. Electronically Signed   By: Kathreen Devoid   On: 03/30/2019 10:13   ECHOCARDIOGRAM COMPLETE  Result Date: 03/22/2019   ECHOCARDIOGRAM REPORT   Patient Name:   HAMER BARBUSH Date of Exam: 03/20/2019 Medical Rec #:  [id number]        Height:       65.0 in Accession #:    [id number]       Weight:       252.0 lb Date of Birth:  [birth year]        BSA:          2.18 m Patient Age:    33 years         BP:           136/77 mmHg Patient Gender: M                HR:           91 bpm. Exam Location:  [facility] Procedure: 2D Echo, Cardiac Doppler and Color Doppler Indications:    Dyspnea 786.09 / R06.00  History:        Patient has prior history of Echocardiogram examinations, most                 recent 07/21/2018. CAD and Previous Myocardial Infarction; Risk                 Factors:Hypertension, Diabetes and Dyslipidemia. Acute on                 chronic diastolic heart failure, Sleep apnea.  Sonographer:    Alvino Chapel RCS Referring Phys: [phone] [facility]  Sonographer Comments: In airborne precautions FINDINGS  Left Ventricle: Left ventricular ejection fraction, by visual estimation, is 30 to 35%. The  left ventricle has moderate to severely decreased function. Definity contrast agent was given IV to delineate the left ventricular endocardial borders. The left ventricle demonstrates regional wall motion abnormalities. The left ventricular internal cavity size was the left ventricle is normal in size. There is moderately increased left ventricular hypertrophy. Left ventricular diastolic parameters are consistent with Grade II diastolic dysfunction (pseudonormalization).  LV Wall Scoring: The apical septal segment, mid inferolateral segment, apical inferior segment, and apex are akinetic. The entire anterior wall, basal and mid anterolateral wall, basal and mid anterior septum, basal and mid inferior septum, basal and mid inferior wall, basal inferolateral segment, and apical  lateral segment are hypokinetic. Right Ventricle: The right ventricular size is normal. No increase in right ventricular wall thickness. Global RV systolic function is has normal systolic function. The tricuspid regurgitant velocity is 2.80 m/s, and with an assumed right atrial pressure  of 15 mmHg, the estimated right ventricular systolic pressure is moderately elevated at 46.4 mmHg. Left Atrium: Left atrial size was mild-moderately dilated. Right Atrium: Right atrial size was normal in size Pericardium: There is no evidence of pericardial effusion. Mitral Valve: The mitral valve is degenerative in appearance. There is mild calcification of the mitral valve leaflet(s). Mild mitral annular calcification. Trivial mitral valve regurgitation. Tricuspid Valve: The tricuspid valve is grossly normal. Tricuspid valve regurgitation is trivial. Aortic Valve: The aortic valve is tricuspid. Aortic valve regurgitation is not visualized. Mild to moderate aortic valve annular calcification. Pulmonic Valve: The pulmonic valve was grossly normal. Pulmonic valve regurgitation is trivial. Pulmonic regurgitation is trivial. Aorta: The aortic root is normal in size and structure. Venous: The inferior vena cava is dilated in size with less than 50% respiratory variability, suggesting right atrial pressure of 15 mmHg. IAS/Shunts: No atrial level shunt detected by color flow Doppler.  LEFT VENTRICLE PLAX 2D LVIDd:         5.01 cm LVIDs:         3.95 cm LV PW:         1.42 cm LV IVS:        1.44 cm LVOT diam:     2.10 cm LV SV:         51 ml LV SV Index:   21.65 LVOT Area:     3.46 cm  LEFT ATRIUM           Index LA diam:      4.50 cm 2.06 cm/m LA Vol (A2C): 91.0 ml 41.70 ml/m   AORTA Ao Root diam: 3.40 cm MV E velocity: 102.00 cm/s  103 cm/s  TRICUSPID VALVE MV A velocity: 7570.00 cm/s 70.3 cm/s TV Peak grad:   313600.0 mmHg MV E/A ratio:  0.01         1.5       TV Vmax:        280.00 m/s                                       TR Peak  grad:   31.4 mmHg                                       TR Vmax:        280.00 cm/s  SHUNTS                                       Systemic Diam: 2.10 cm  [name] MD Electronically signed by [name] MD Signature Date/Time: 04/04/2019/3:51:02 PM    Final     DVT Prophylaxis -SCD /iv heparin AM Labs Ordered, also please review Full Orders  Family Communication: Admission, patients condition and plan of care including tests being ordered have been discussed with the patient and son--Was able to Discuss with Son Aladino [id number]  who indicate understanding and agree with the plan   Code Status - DNR  Likely DC to  TBD  Condition   Fair  Roxan Hockey M.D on 04/10/2019 at 6:26 PM Go to [email or web address] -  for contact info  [facility] - Office  [phone]

## 2019-03-31 NOTE — ED Notes (Signed)
Date and time results received: 03/26/2019 1024 (use smartphrase ".now" to insert current time)  Test:Troponin Critical Value: 1024  Name of Provider Notified: S. Petrucelli PA  Orders Received? Or Actions Taken?:NA

## 2019-03-31 NOTE — ED Notes (Signed)
Dr Emokpae at bedside.  

## 2019-03-31 NOTE — Progress Notes (Signed)
ANTICOAGULATION CONSULT NOTE - Initial Consult  Pharmacy Consult for heparin Indication: chest pain/ACS   Patient Measurements: Height: 5\' 5"  (165.1 cm) Weight: 252 lb 11.2 oz (114.6 kg) IBW/kg (Calculated) : 61.5 Heparin Dosing Weight: 89 kg  Vital Signs: Temp: 98.3 F (36.8 C) (12/18 0930) Temp Source: Oral (12/18 0930) BP: 141/76 (12/18 1730) Pulse Rate: 96 (12/18 1730)  Labs: Recent Labs    03/30/2019 0935 04/13/2019 1120  HGB 11.7*  --   HCT 38.3*  --   PLT 193  --   CREATININE 2.08* 2.11*  TROPONINIHS 920* 1,049*     Assessment: Pt with chest pain and rising troponin. Cardiology recommends heparin for 48 hours and conservative treatment. Pt has elevated baseline SCr 2-2.3, currently 2.1 with CrCl ~ 30 ml/min. H/h plts are wnl. Pt did receive heparin SQ bolus at 1830 today.    Goal of Therapy:  Heparin level 0.3-0.7 units/ml Monitor platelets by anticoagulation protocol: Yes    Plan:  -Heparin infusion at 1100 units/hr -Daily HL, CBC -First level with AM labs   Tremond Shimabukuro, Jake Church 04/11/2019,6:32 PM

## 2019-03-31 NOTE — ED Triage Notes (Signed)
Pt brought to ED via [facility] for Respiratory distress. Per EMS, sat 82% on arrival started on 15L and then bumped to 6L sat now 96%. CBG 226. Pt c/o SOB since last week states called EMS cause he wasn't getting better. Temp 100.0 per EMS.

## 2019-03-31 NOTE — Progress Notes (Signed)
*  PRELIMINARY RESULTS* Echocardiogram 2D Echocardiogram has been performed with Definity.  Samuel Germany 03/16/2019, 3:20 PM

## 2019-03-31 NOTE — ED Notes (Signed)
Called AC for lantus 

## 2019-03-31 NOTE — ED Notes (Signed)
Date and time results received: 03/20/2019 2146 (use smartphrase ".now" to insert current time)  Test: Troponin Critical Value: 1519  Name of Provider Notified: Dr. Clearence Ped  Orders Received? Or Actions Taken?:

## 2019-03-31 NOTE — ED Notes (Signed)
Date and time results received: 04/13/2019 1229 (use smartphrase ".now" to insert current time)  Test:Troponin Critical Value: [place] Name of Provider Notified: Annetta Maw PA Orders Received? Or Actions Taken?: NA

## 2019-03-31 NOTE — ED Provider Notes (Signed)
Shared service with APP.  I have personally seen and examined the patient, providing direct face to face care.  Physical exam findings and plan include patient presents with worsening shortness of breath, leg edema, weight gain.  On exam patient has crackles at the bases and increased respiratory effort.  Patient requiring 5 to 6 L nasal cannula and more comfortable on nonrebreather.  Patient declining BiPAP at this time.  Plan for IV Lasix and telemetry admission to likely stepdown.  Patient comfortable this plan.  Patient will be continue to monitor closely in the ER.  1. Acute on chronic congestive heart failure, unspecified heart failure type ([place])   2. Acute respiratory failure with hypoxia ([place])     CRITICAL CARE Performed by: Mariea Clonts  Total critical care time: 35 minutes  Critical care time was exclusive of separately billable procedures and treating other patients.  Critical care was necessary to treat or prevent imminent or life-threatening deterioration.  Critical care was time spent personally by me on the following activities: development of treatment plan with patient and/or surrogate as well as nursing, discussions with consultants, evaluation of patient's response to treatment, examination of patient, obtaining history from patient or surrogate, ordering and performing treatments and interventions, ordering and review of laboratory studies, ordering and review of radiographic studies, pulse oximetry and re-evaluation of patient's condition.    Elnora Morrison, MD 04/01/19 [phone]

## 2019-03-31 NOTE — ED Notes (Signed)
Pt had pulled O2 off sat dropped to 72%, non rebreather replaced sat increased to 96%

## 2019-03-31 NOTE — ED Notes (Signed)
Respiratory called to come assess patient, informed pt has been on nonrebreather since arrival.

## 2019-03-31 NOTE — ED Notes (Addendum)
Rounding on pt to check on external catheter/urine output after Lasix administration and assist lab with blood draw. Pt was clammy, sweating, and appeared to have difficulty breathing. Pt initially refusing blood to be drawn. Discussed need for blood. Pt cooperative with blood draw at that time. Pt on NRB, O2 sats at 93-94%. Axillary temp 98.1 and CBG 213. Pt repeatedly removing NRB and O2 drops to mid 80s range. EDP and hospitalist consulted regarding deterioration of pt condition. Foley placed per order for lasix regimen. Pt placed on bipap by Maudie Mercury, RT per hospitalist order. Blood

## 2019-03-31 NOTE — ED Notes (Signed)
POA Aaron Edelman called and obtained consent to transfer pt to [facility].

## 2019-03-31 NOTE — ED Notes (Signed)
EDP at bedside  

## 2019-03-31 NOTE — ED Notes (Signed)
Respiratory on unit and informed pt bed change to [facility]. Per Respiratory, attempt to leave on NR for transport.

## 2019-04-01 DIAGNOSIS — I130 Hypertensive heart and chronic kidney disease with heart failure and stage 1 through stage 4 chronic kidney disease, or unspecified chronic kidney disease: Secondary | ICD-10-CM | POA: Diagnosis not present

## 2019-04-01 DIAGNOSIS — Z794 Long term (current) use of insulin: Secondary | ICD-10-CM | POA: Diagnosis not present

## 2019-04-01 DIAGNOSIS — Z7902 Long term (current) use of antithrombotics/antiplatelets: Secondary | ICD-10-CM | POA: Diagnosis not present

## 2019-04-01 DIAGNOSIS — J9621 Acute and chronic respiratory failure with hypoxia: Secondary | ICD-10-CM | POA: Diagnosis not present

## 2019-04-01 DIAGNOSIS — I214 Non-ST elevation (NSTEMI) myocardial infarction: Secondary | ICD-10-CM | POA: Diagnosis not present

## 2019-04-01 DIAGNOSIS — I5023 Acute on chronic systolic (congestive) heart failure: Secondary | ICD-10-CM

## 2019-04-01 DIAGNOSIS — N401 Enlarged prostate with lower urinary tract symptoms: Secondary | ICD-10-CM | POA: Diagnosis not present

## 2019-04-01 DIAGNOSIS — I252 Old myocardial infarction: Secondary | ICD-10-CM | POA: Diagnosis not present

## 2019-04-01 DIAGNOSIS — E1122 Type 2 diabetes mellitus with diabetic chronic kidney disease: Secondary | ICD-10-CM | POA: Diagnosis not present

## 2019-04-01 DIAGNOSIS — N1832 Chronic kidney disease, stage 3b: Secondary | ICD-10-CM | POA: Diagnosis not present

## 2019-04-01 DIAGNOSIS — Z515 Encounter for palliative care: Secondary | ICD-10-CM | POA: Diagnosis not present

## 2019-04-01 DIAGNOSIS — Z20828 Contact with and (suspected) exposure to other viral communicable diseases: Secondary | ICD-10-CM | POA: Diagnosis not present

## 2019-04-01 DIAGNOSIS — E871 Hypo-osmolality and hyponatremia: Secondary | ICD-10-CM | POA: Diagnosis not present

## 2019-04-01 DIAGNOSIS — I25810 Atherosclerosis of coronary artery bypass graft(s) without angina pectoris: Secondary | ICD-10-CM | POA: Diagnosis not present

## 2019-04-01 DIAGNOSIS — N179 Acute kidney failure, unspecified: Secondary | ICD-10-CM | POA: Diagnosis not present

## 2019-04-01 DIAGNOSIS — E785 Hyperlipidemia, unspecified: Secondary | ICD-10-CM | POA: Diagnosis not present

## 2019-04-01 DIAGNOSIS — Z8249 Family history of ischemic heart disease and other diseases of the circulatory system: Secondary | ICD-10-CM | POA: Diagnosis not present

## 2019-04-01 DIAGNOSIS — I5043 Acute on chronic combined systolic (congestive) and diastolic (congestive) heart failure: Secondary | ICD-10-CM | POA: Diagnosis not present

## 2019-04-01 DIAGNOSIS — E1165 Type 2 diabetes mellitus with hyperglycemia: Secondary | ICD-10-CM | POA: Diagnosis not present

## 2019-04-01 DIAGNOSIS — Z7982 Long term (current) use of aspirin: Secondary | ICD-10-CM | POA: Diagnosis not present

## 2019-04-01 DIAGNOSIS — Z66 Do not resuscitate: Secondary | ICD-10-CM | POA: Diagnosis not present

## 2019-04-01 DIAGNOSIS — Z9981 Dependence on supplemental oxygen: Secondary | ICD-10-CM | POA: Diagnosis not present

## 2019-04-01 DIAGNOSIS — Z955 Presence of coronary angioplasty implant and graft: Secondary | ICD-10-CM | POA: Diagnosis not present

## 2019-04-01 DIAGNOSIS — Z6841 Body Mass Index (BMI) 40.0 and over, adult: Secondary | ICD-10-CM | POA: Diagnosis not present

## 2019-04-01 LAB — GLUCOSE, CAPILLARY
Glucose-Capillary: 137 mg/dL — ABNORMAL HIGH (ref 70–99)
Glucose-Capillary: 144 mg/dL — ABNORMAL HIGH (ref 70–99)
Glucose-Capillary: 140 mg/dL — ABNORMAL HIGH (ref 70–99)
Glucose-Capillary: 137 mg/dL — ABNORMAL HIGH (ref 70–99)

## 2019-04-01 LAB — TROPONIN I (HIGH SENSITIVITY)
Troponin I (High Sensitivity): 1365 ng/L (ref <18)
Troponin I (High Sensitivity): 2922 ng/L (ref <18)

## 2019-04-01 LAB — CBC
HCT: 38.6 % — ABNORMAL LOW (ref 39.0–52.0)
Hemoglobin: 11.1 g/dL — ABNORMAL LOW (ref 13.0–17.0)
MCH: 28.5 pg (ref 26.0–34.0)
MCHC: 28.8 g/dL — ABNORMAL LOW (ref 30.0–36.0)
MCV: 99.2 fL (ref 80.0–100.0)
Platelets: 159 10*3/uL (ref 150–400)
RBC: 3.89 MIL/uL — ABNORMAL LOW (ref 4.22–5.81)
RDW: 14.6 % (ref 11.5–15.5)
WBC: 7.1 10*3/uL (ref 4.0–10.5)
nRBC: 0 % (ref 0.0–0.2)

## 2019-04-01 LAB — BASIC METABOLIC PANEL
Anion gap: 17 — ABNORMAL HIGH (ref 5–15)
BUN: 48 mg/dL — ABNORMAL HIGH (ref 8–23)
CO2: 18 mmol/L — ABNORMAL LOW (ref 22–32)
Calcium: 8.6 mg/dL — ABNORMAL LOW (ref 8.9–10.3)
Chloride: 103 mmol/L (ref 98–111)
Creatinine, Ser: 2.63 mg/dL — ABNORMAL HIGH (ref 0.61–1.24)
GFR calc Af Amer: 25 mL/min — ABNORMAL LOW (ref >60)
GFR calc non Af Amer: 22 mL/min — ABNORMAL LOW (ref >60)
Glucose, Bld: 182 mg/dL — ABNORMAL HIGH (ref 70–99)
Potassium: 5.5 mmol/L — ABNORMAL HIGH (ref 3.5–5.1)
Sodium: 138 mmol/L (ref 135–145)

## 2019-04-01 LAB — HEMOGLOBIN A1C
Hgb A1c MFr Bld: 7.4 % — ABNORMAL HIGH (ref 4.8–5.6)
Mean Plasma Glucose: 165.68 mg/dL

## 2019-04-01 LAB — URINE CULTURE: Culture: NO GROWTH

## 2019-04-01 LAB — HEPARIN LEVEL (UNFRACTIONATED)
Heparin Unfractionated: 0.2 IU/mL — ABNORMAL LOW (ref 0.30–0.70)
Heparin Unfractionated: 0.48 IU/mL (ref 0.30–0.70)

## 2019-04-01 MED ORDER — HYDROCODONE-ACETAMINOPHEN 5-325 MG PO TABS
1.0000 | ORAL_TABLET | Freq: Four times a day (QID) | ORAL | Status: DC | PRN
  Administered 2019-04-01: 1 via ORAL
  Filled 2019-04-01: qty 1

## 2019-04-01 MED ORDER — FUROSEMIDE 10 MG/ML IJ SOLN
40.0000 mg | Freq: Two times a day (BID) | INTRAMUSCULAR | Status: DC
  Administered 2019-04-02: 40 mg via INTRAVENOUS
  Filled 2019-04-01: qty 4

## 2019-04-01 MED ORDER — FUROSEMIDE 10 MG/ML IJ SOLN: 40.0000 mg | Freq: Two times a day (BID) | INTRAMUSCULAR | Status: DC

## 2019-04-01 MED ORDER — FUROSEMIDE 10 MG/ML IJ SOLN
120.0000 mg | Freq: Once | INTRAVENOUS | Status: AC
  Administered 2019-04-01: 120 mg via INTRAVENOUS
  Filled 2019-04-01: qty 10

## 2019-04-01 MED ORDER — CHLORHEXIDINE GLUCONATE CLOTH 2 % EX PADS
6.0000 | MEDICATED_PAD | Freq: Every day | CUTANEOUS | Status: DC
  Administered 2019-04-01 – 2019-04-04 (×4): 6 via TOPICAL

## 2019-04-01 MED ORDER — HEPARIN BOLUS VIA INFUSION
1300.0000 [IU] | Freq: Once | INTRAVENOUS | Status: AC
  Administered 2019-04-01: 1300 [IU] via INTRAVENOUS
  Filled 2019-04-01: qty 1300

## 2019-04-01 MED ORDER — PROMETHAZINE HCL 25 MG PO TABS
25.0000 mg | ORAL_TABLET | Freq: Four times a day (QID) | ORAL | Status: DC | PRN
  Administered 2019-04-01 – 2019-04-02 (×3): 25 mg via ORAL
  Filled 2019-04-01 (×3): qty 1

## 2019-04-01 NOTE — Progress Notes (Signed)
Lab called with a critical troponin lab value of 2,922. MD paged.

## 2019-04-01 NOTE — Progress Notes (Signed)
ANTICOAGULATION CONSULT NOTE - Follow-Up Consult  Pharmacy Consult for heparin Indication: chest pain/ACS   Patient Measurements: Height: 5\' 5"  (165.1 cm) Weight: 268 lb 1.6 oz (121.6 kg) IBW/kg (Calculated) : 61.5 Heparin Dosing Weight: 89 kg  Vital Signs: Temp: 97.3 F (36.3 C) (12/19 1231) Temp Source: Oral (12/19 1231) BP: 100/40 (12/19 1434) Pulse Rate: 70 (12/19 1434)  Labs: Recent Labs    04/01/2019 0935 03/23/2019 1120 04/01/2019 2005 03/14/2019 2248 04/01/19 0630 04/01/19 1559  HGB 11.7*  --   --   --  11.1*  --   HCT 38.3*  --   --   --  38.6*  --   PLT 193  --   --   --  159  --   HEPARINUNFRC  --   --   --   --  0.20* 0.48  CREATININE 2.08* 2.11*  --   --  2.63*  --   TROPONINIHS 920* 1,049* 1,519* 1,365* 2,922*  --      Assessment: Pt with chest pain and rising troponin. Cardiology recommends heparin for 48 hours and conservative treatment. Pt has elevated baseline SCr 2-2.3, currently 2.1 with CrCl ~ 30 ml/min. No anticoagulation PTA, just Plavix and aspirin.   Repeat heparin level therapeutic at 0.48.   Goal of Therapy:  Heparin level 0.3-0.7 units/ml Monitor platelets by anticoagulation protocol: Yes    Plan:  -Continue heparin 1250 units/h -Daily heparin level and CBC   Arrie Senate, PharmD, BCPS Clinical Pharmacist [phone] Please check AMION for all [facility] Pharmacy numbers 04/01/2019

## 2019-04-01 NOTE — Progress Notes (Signed)
ANTICOAGULATION CONSULT NOTE - Consult  Pharmacy Consult for heparin Indication: chest pain/ACS   Patient Measurements: Height: 5\' 5"  (165.1 cm) Weight: 268 lb 1.6 oz (121.6 kg) IBW/kg (Calculated) : 61.5 Heparin Dosing Weight: 89 kg  Vital Signs: Temp: 99.6 F (37.6 C) (12/19 0354) Temp Source: Oral (12/19 0354) BP: 112/60 (12/19 0648) Pulse Rate: 90 (12/19 0648)  Labs: Recent Labs    04/09/2019 0935 04/08/2019 1120 04/02/2019 2005 04/04/2019 2248 04/01/19 0630  HGB 11.7*  --   --   --  11.1*  HCT 38.3*  --   --   --  38.6*  PLT 193  --   --   --  159  HEPARINUNFRC  --   --   --   --  0.20*  CREATININE 2.08* 2.11*  --   --   --   TROPONINIHS 920* 1,049* 1,519* 1,365*  --      Assessment: Pt with chest pain and rising troponin. Cardiology recommends heparin for 48 hours and conservative treatment. Pt has elevated baseline SCr 2-2.3, currently 2.1 with CrCl ~ 30 ml/min. No anticoagulation PTA, just Plavix and aspirin.   Heparin level this monring is subtherapeutic at 0.20 on 1100 units/hour. H&H is stable since yesterday at 11.1/38.6, plts wnl.    Goal of Therapy:  Heparin level 0.3-0.7 units/ml Monitor platelets by anticoagulation protocol: Yes    Plan:  - bolus heparin with 1300 units  - increase heparin infusion to 1250 units/hr - 8 hour HL   - Daily HL, CBC, and monitor for signs of bleeding  - follow up plans for discontinuation at 48 hours (12/20 ~1900)     Thank you,   Eddie Candle, PharmD PGY-1 Pharmacy Resident   Please check amion for clinical pharmacist contact number

## 2019-04-01 NOTE — ED Notes (Signed)
Date and time results received: 04/01/19 0013  Test: Troponin Critical Value: 1365  Name of Provider Notified: Pollina  Orders Received? Or Actions Taken?: na

## 2019-04-01 NOTE — Progress Notes (Addendum)
PROGRESS NOTE  [name] [id number] DOB: 21-Sep-1933 DOA: 04/07/2019 PCP: Rosalee Kaufman, PA-C  HPI/Recap of past 24 hours: [name] is an 85 year old male with past medical history of coronary artery disease prior CABG 1994, COPD, CHF, with reduced ejection fraction, type 2 diabetes mellitus, obesity, hypertension, hyperlipidemia, sleep apnea, chronic kidney disease stage III, who initially presented to [facility] with shortness of breath and was transferred to [facility] due drop in ejection fraction on echo from 40 -45% now to 30 -35%, new finding of regional wall motion abnormalities, worsening dyspnea with worsening hypoxia and elevated troponin in the 2000 as well as elevated BNP patient was transferred to [facility] by admitting MD after discussion with cardiologist.     Subjective: Patient seen and examined at bedside he complains he is not feeling.  He is complaining of pain and Tylenol is not helping requesting something stronger for pain.  Patient is currently on nasal cannula high flow at 15 L/min.   Assessment/Plan: Principal Problem:   Acute on chronic HFrEF (heart failure with reduced ejection fraction) (HCC) Active Problems:   Coronary artery disease involving native coronary artery of native heart with unstable angina pectoris (HCC)   Sleep apnea   Essential hypertension, benign   Obesity, Class III, BMI 40-49.9 (morbid obesity) ([place])   Type 2 diabetes mellitus with stage 3 chronic kidney disease, with long-term current use of insulin (HCC)   CAD (coronary artery disease) of artery bypass graft   Chronic combined systolic and diastolic CHF (congestive heart failure) (HCC)   CKD stage 3 secondary to diabetes (HCC)   Elevated troponin   Acute exacerbation of CHF (congestive heart failure) ([place])   #1 non-STEMI/ACS with troponin this morning of 2922. I have discussed with cardiologist Dr. Claiborne Billings who confirmed he is aware of this consult and  transfer.  2.  HFrEF.  Acute on chronic combined systolic and diastolic congestive heart failure exacerbation in the setting of possible ACS.  His most recent echo shows much reduced ejection fraction of 30 to 35%. Continue IV diuresis with Lasix Continue IV heparin Continue isosorbide combo Daily weight and fluid input and output monitoring  Cardiology has been consulted  3.  Acute on chronic hypoxic respiratory failure due to COPD and CHF.  Patient is on chronic oxygen at 2 L/min at home.  He is currently on high flow nasal cannula at 15 L/min on this admission patient is requiring increased oxygen due to CHF and possibly ACS.    4.  Chronic kidney disease stage III/AKI.  Most likely from his acute CHF exacerbation.  Due to his CHF patient will need aggressive diuresis also his AKI will need to be monitored closely.  5.  Diabetes mellitus uncontrolled last A1c was 8.8 continue Lantus 60 units at bedtime and sliding scale coverage  6.  Morbid obesity.  As much as this is possible patient on need a reduced calorie diet is to be complicating his hypoxia due to all his comorbidity  7.  Obstructive sleep apnea on CPAP  8.  BPH with LUTS and possible UTI.  Continue Flomax for BPH Code Status: DNR  Severity of Illness: The appropriate patient status for this patient is INPATIENT. Inpatient status is judged to be reasonable and necessary in order to provide the required intensity of service to ensure the patient's safety. The patient's presenting symptoms, physical exam findings, and initial radiographic and laboratory data in the context of their chronic comorbidities is felt  to place them at high risk for further clinical deterioration. Furthermore, it is not anticipated that the patient will be medically stable for discharge from the hospital within 2 midnights of admission. The following factors support the patient status of inpatient.   " The patient's presenting symptoms include shortness of  breath dyspnea hypoxia. " The worrisome physical exam findings include shortness of breath dyspnea hypoxia. " The initial radiographic and laboratory data are worrisome because of non-STEMI. " The chronic co-morbidities include coronary artery disease.   * I certify that at the point of admission it is my clinical judgment that the patient will require inpatient hospital care spanning beyond 2 midnights from the point of admission due to high intensity of service, high risk for further deterioration and high frequency of surveillance required.*    Family Communication: None  Disposition Plan: To be determined   Consultants:  Cardiology Dr. Claiborne Billings  Procedures:  None  Antimicrobials:  None  DVT prophylaxis: Heparin IV   Objective: Vitals:   04/01/19 0354 04/01/19 0401 04/01/19 0648 04/01/19 0700  BP: (!) 109/44  112/60   Pulse: 95  90 86  Resp: 19   16  Temp: 99.6 F (37.6 C)     TempSrc: Oral     SpO2: 99%   99%  Weight:  121.6 kg    Height:        Intake/Output Summary (Last 24 hours) at 04/01/2019 0809 Last data filed at 04/01/2019 0600 Gross per 24 hour  Intake 3 ml  Output 0 ml  Net 3 ml   Filed Weights   03/23/2019 0923 04/06/2019 1111 04/01/19 0401  Weight: 117.9 kg 114.6 kg 121.6 kg   Body mass index is 44.61 kg/m.  Exam:  . General: 85 y.o. year-old male well developed well nourished in no acute distress.  Alert and oriented x3.  Morbidly obese not in acute distress or respiratory distress . Cardiovascular: Regular rate and rhythm with no rubs or gallops.  No thyromegaly or JVD noted.   Marland Kitchen Respiratory: Clear to auscultation with no wheezes or rales. Good inspiratory effort.  At rest . Abdomen: Soft, tender right lower quadrant nondistended with normal bowel sounds x4 quadrants. . Musculoskeletal: 2+ lower extremity edema. 2/4 pulses in all 4 extremities. . Skin: No ulcerative lesions noted or rashes, . Psychiatry: Mood is appropriate for condition  and setting: Flat affect    Data Reviewed: CBC: Recent Labs  Lab 03/16/2019 0935 04/01/19 0630  WBC 10.5 7.1  NEUTROABS 8.0*  --   HGB 11.7* 11.1*  HCT 38.3* 38.6*  MCV 93.9 99.2  PLT 193 [id number]   Basic Metabolic Panel: Recent Labs  Lab 03/19/2019 0935 03/28/2019 1120 04/01/19 0630  NA 135 136 138  K 4.8 4.7 5.5*  CL 101 100 103  CO2 26 26 18*  GLUCOSE 226* 231* 182*  BUN 40* 40* 48*  CREATININE 2.08* 2.11* 2.63*  CALCIUM 8.6* 8.6* 8.6*   GFR: Estimated Creatinine Clearance: 25.7 mL/min (A) (by C-G formula based on SCr of 2.63 mg/dL (H)). Liver Function Tests: Recent Labs  Lab 04/10/2019 0935  AST 26  ALT 29  ALKPHOS 61  BILITOT 0.6  PROT 6.9  ALBUMIN 3.6   Recent Labs  Lab 03/29/2019 0935  LIPASE 28   No results for input(s): AMMONIA in the last 168 hours. Coagulation Profile: No results for input(s): INR, PROTIME in the last 168 hours. Cardiac Enzymes: No results for input(s): CKTOTAL, CKMB, CKMBINDEX, TROPONINI in the last  168 hours. BNP (last 3 results) No results for input(s): PROBNP in the last 8760 hours. HbA1C: No results for input(s): HGBA1C in the last 72 hours. CBG: Recent Labs  Lab 04/08/2019 1955 04/04/2019 2142 04/01/19 0626  GLUCAP 213* 213* 137*   Lipid Profile: No results for input(s): CHOL, HDL, LDLCALC, TRIG, CHOLHDL, LDLDIRECT in the last 72 hours. Thyroid Function Tests: No results for input(s): TSH, T4TOTAL, FREET4, T3FREE, THYROIDAB in the last 72 hours. Anemia Panel: No results for input(s): VITAMINB12, FOLATE, FERRITIN, TIBC, IRON, RETICCTPCT in the last 72 hours. Urine analysis:    Component Value Date/Time   COLORURINE YELLOW 03/30/2019 0934   APPEARANCEUR CLOUDY (A) 03/21/2019 0934   LABSPEC 1.011 04/08/2019 0934   PHURINE 5.0 04/13/2019 0934   GLUCOSEU NEGATIVE 03/28/2019 0934   HGBUR MODERATE (A) 03/30/2019 0934   BILIRUBINUR NEGATIVE 03/24/2019 0934   KETONESUR NEGATIVE 04/03/2019 0934   PROTEINUR 100 (A) 03/20/2019  0934   NITRITE NEGATIVE 04/04/2019 0934   LEUKOCYTESUR NEGATIVE 04/12/2019 0934   Sepsis Labs: @LABRCNTIP (procalcitonin:4,lacticidven:4)  ) Recent Results (from the past 240 hour(s))  SARS CORONAVIRUS 2 (TAT 6-24 HRS) Nasopharyngeal Nasopharyngeal Swab     Status: None   Collection Time: 04/04/2019 11:16 AM   Specimen: Nasopharyngeal Swab  Result Value Ref Range Status   SARS Coronavirus 2 NEGATIVE NEGATIVE Final    Comment: (NOTE) SARS-CoV-2 target nucleic acids are NOT DETECTED. The SARS-CoV-2 RNA is generally detectable in upper and lower respiratory specimens during the acute phase of infection. Negative results do not preclude SARS-CoV-2 infection, do not rule out co-infections with other pathogens, and should not be used as the sole basis for treatment or other patient management decisions. Negative results must be combined with clinical observations, patient history, and epidemiological information. The expected result is Negative. Fact Sheet for Patients: SugarRoll.be Fact Sheet for Healthcare Providers: https://www.woods-mathews.com/ This test is not yet approved or cleared by the [place] FDA and  has been authorized for detection and/or diagnosis of SARS-CoV-2 by FDA under an Emergency Use Authorization (EUA). This EUA will remain  in effect (meaning this test can be used) for the duration of the COVID-19 declaration under Section 56 4(b)(1) of the Act, 21 U.S.C. section 360bbb-3(b)(1), unless the authorization is terminated or revoked sooner. Performed at [facility], [place] [address] [place], [place] [address]   Respiratory Panel by RT PCR (Flu A&B, Covid) - Nasopharyngeal Swab     Status: None   Collection Time: 03/25/2019  8:16 PM   Specimen: Nasopharyngeal Swab  Result Value Ref Range Status   SARS Coronavirus 2 by RT PCR NEGATIVE NEGATIVE Final    Comment: (NOTE) SARS-CoV-2 target nucleic acids are NOT  DETECTED. The SARS-CoV-2 RNA is generally detectable in upper respiratoy specimens during the acute phase of infection. The lowest concentration of SARS-CoV-2 viral copies this assay can detect is 131 copies/mL. A negative result does not preclude SARS-Cov-2 infection and should not be used as the sole basis for treatment or other patient management decisions. A negative result may occur with  improper specimen collection/handling, submission of specimen other than nasopharyngeal swab, presence of viral mutation(s) within the areas targeted by this assay, and inadequate number of viral copies (<131 copies/mL). A negative result must be combined with clinical observations, patient history, and epidemiological information. The expected result is Negative. Fact Sheet for Patients:  PinkCheek.be Fact Sheet for Healthcare Providers:  GravelBags.it This test is not yet ap proved or cleared by the [place]  States FDA and  has been authorized for detection and/or diagnosis of SARS-CoV-2 by FDA under an Emergency Use Authorization (EUA). This EUA will remain  in effect (meaning this test can be used) for the duration of the COVID-19 declaration under Section 564(b)(1) of the Act, 21 U.S.C. section 360bbb-3(b)(1), unless the authorization is terminated or revoked sooner.    Influenza A by PCR NEGATIVE NEGATIVE Final   Influenza B by PCR NEGATIVE NEGATIVE Final    Comment: (NOTE) The Xpert Xpress SARS-CoV-2/FLU/RSV assay is intended as an aid in  the diagnosis of influenza from Nasopharyngeal swab specimens and  should not be used as a sole basis for treatment. Nasal washings and  aspirates are unacceptable for Xpert Xpress SARS-CoV-2/FLU/RSV  testing. Fact Sheet for Patients: PinkCheek.be Fact Sheet for Healthcare Providers: GravelBags.it This test is not yet approved or cleared  by the [place] FDA and  has been authorized for detection and/or diagnosis of SARS-CoV-2 by  FDA under an Emergency Use Authorization (EUA). This EUA will remain  in effect (meaning this test can be used) for the duration of the  Covid-19 declaration under Section 564(b)(1) of the Act, 21  U.S.C. section 360bbb-3(b)(1), unless the authorization is  terminated or revoked. Performed at [facility], [address] [place], [place] [address]       Studies: DG Chest [facility] 1 View  Result Date: 04/06/2019 CLINICAL DATA:  Dyspnea, shortness of breath EXAM: PORTABLE CHEST 1 VIEW COMPARISON:  12/18/2018 FINDINGS: Bilateral interstitial and alveolar airspace opacities. Bilateral small pleural effusions. No pneumothorax. Stable cardiomegaly. Prior CABG. No acute osseous abnormality. IMPRESSION: Bilateral interstitial and alveolar airspace opacities. Differential considerations include pulmonary edema versus multilobar pneumonia. Electronically Signed   By: Kathreen Devoid   On: 03/30/2019 10:13   ECHOCARDIOGRAM COMPLETE  Result Date: 03/17/2019   ECHOCARDIOGRAM REPORT   Patient Name:   DARMON MOZLEY Date of Exam: 04/12/2019 Medical Rec #:  [id number]        Height:       65.0 in Accession #:    [id number]       Weight:       252.0 lb Date of Birth:  05/03/1933        BSA:          2.18 m Patient Age:    59 years         BP:           136/77 mmHg Patient Gender: M                HR:           91 bpm. Exam Location:  [facility] Procedure: 2D Echo, Cardiac Doppler and Color Doppler Indications:    Dyspnea 786.09 / R06.00  History:        Patient has prior history of Echocardiogram examinations, most                 recent 07/21/2018. CAD and Previous Myocardial Infarction; Risk                 Factors:Hypertension, Diabetes and Dyslipidemia. Acute on                 chronic diastolic heart failure, Sleep apnea.  Sonographer:    Alvino Chapel RCS Referring Phys: [phone] [facility]  Sonographer Comments:  In airborne precautions FINDINGS  Left Ventricle: Left ventricular ejection fraction, by visual estimation, is 30 to 35%. The left ventricle has moderate  to severely decreased function. Definity contrast agent was given IV to delineate the left ventricular endocardial borders. The left ventricle demonstrates regional wall motion abnormalities. The left ventricular internal cavity size was the left ventricle is normal in size. There is moderately increased left ventricular hypertrophy. Left ventricular diastolic parameters are consistent with Grade II diastolic dysfunction (pseudonormalization).  LV Wall Scoring: The apical septal segment, mid inferolateral segment, apical inferior segment, and apex are akinetic. The entire anterior wall, basal and mid anterolateral wall, basal and mid anterior septum, basal and mid inferior septum, basal and mid inferior wall, basal inferolateral segment, and apical lateral segment are hypokinetic. Right Ventricle: The right ventricular size is normal. No increase in right ventricular wall thickness. Global RV systolic function is has normal systolic function. The tricuspid regurgitant velocity is 2.80 m/s, and with an assumed right atrial pressure  of 15 mmHg, the estimated right ventricular systolic pressure is moderately elevated at 46.4 mmHg. Left Atrium: Left atrial size was mild-moderately dilated. Right Atrium: Right atrial size was normal in size Pericardium: There is no evidence of pericardial effusion. Mitral Valve: The mitral valve is degenerative in appearance. There is mild calcification of the mitral valve leaflet(s). Mild mitral annular calcification. Trivial mitral valve regurgitation. Tricuspid Valve: The tricuspid valve is grossly normal. Tricuspid valve regurgitation is trivial. Aortic Valve: The aortic valve is tricuspid. Aortic valve regurgitation is not visualized. Mild to moderate aortic valve annular calcification. Pulmonic Valve: The pulmonic valve was  grossly normal. Pulmonic valve regurgitation is trivial. Pulmonic regurgitation is trivial. Aorta: The aortic root is normal in size and structure. Venous: The inferior vena cava is dilated in size with less than 50% respiratory variability, suggesting right atrial pressure of 15 mmHg. IAS/Shunts: No atrial level shunt detected by color flow Doppler.  LEFT VENTRICLE PLAX 2D LVIDd:         5.01 cm LVIDs:         3.95 cm LV PW:         1.42 cm LV IVS:        1.44 cm LVOT diam:     2.10 cm LV SV:         51 ml LV SV Index:   21.65 LVOT Area:     3.46 cm  LEFT ATRIUM           Index LA diam:      4.50 cm 2.06 cm/m LA Vol (A2C): 91.0 ml 41.70 ml/m   AORTA Ao Root diam: 3.40 cm MV E velocity: 102.00 cm/s  103 cm/s  TRICUSPID VALVE MV A velocity: 7570.00 cm/s 70.3 cm/s TV Peak grad:   313600.0 mmHg MV E/A ratio:  0.01         1.5       TV Vmax:        280.00 m/s                                       TR Peak grad:   31.4 mmHg                                       TR Vmax:        280.00 cm/s  SHUNTS                                       Systemic Diam: 2.10 cm  [name] MD Electronically signed by [name] MD Signature Date/Time: 04/02/2019/3:51:02 PM    Final     Scheduled Meds: . albuterol  6 puff Inhalation Once  . allopurinol  50 mg Oral QODAY  . aspirin EC  81 mg Oral q morning - 10a  . Chlorhexidine Gluconate Cloth  6 each Topical Daily  . citalopram  20 mg Oral Daily  . clopidogrel  75 mg Oral Daily  . furosemide  40 mg Intravenous Q12H  . hydrALAZINE  50 mg Oral TID  . insulin aspart  0-5 Units Subcutaneous QHS  . insulin aspart  0-9 Units Subcutaneous TID WC  . insulin aspart  2 Units Subcutaneous TID WC  . insulin glargine  60 Units Subcutaneous QHS  . isosorbide mononitrate  120 mg Oral Daily  . magnesium oxide  400 mg Oral Daily  . metoprolol tartrate  50 mg Oral BID  . pantoprazole  40 mg Oral Daily  . sodium chloride flush  3 mL  Intravenous Q12H  . tamsulosin  0.4 mg Oral QPM    Continuous Infusions: . sodium chloride    . cefTRIAXone (ROCEPHIN)  IV Stopped (04/06/2019 2011)  . heparin 1,250 Units/hr (04/01/19 0804)     LOS: 1 day     Cristal Deer, MD [facility]  To reach me or the doctor on call, go to: [email or web address] Password [email or web address]  04/01/2019, 8:09 AM

## 2019-04-01 NOTE — Consult Note (Addendum)
Cardiology Consultation:   Patient ID: [name] MRN: [id number]; DOB: 1933/08/23  Admit date: 03/17/2019 Date of Consult: 04/01/2019  Primary Care Provider: Rosalee Kaufman, PA-C Primary Cardiologist: [name], MD  Primary Electrophysiologist:  None    Patient Profile:   [name] is a 85 y.o. male with a hx of CAD and combined CHF who is being seen today for the evaluation of acute on chronic CHF and elevated Troponin c/w NSTEMI at the request of Dr [name].  History of Present Illness:   [name] is an 85 year old male followed by Dr. [name].  The patient has a history of coronary disease, he had bypass grafting in 1994 and apparently multiple interventions after this.  His last intervention was 2014 in the setting of a NSTEMI.  Cath showed native LAD and RCA occlusion and LM 25% stenosis. The LCX had a 60-70% stenosis in somewhat narrow vessel.  The AV groove had a patent stent. There is a step off in the caliber of the vessel immediately after the stent with long 50% stenosis leading into a large obtuse marginal. The obtuse marginal is free of high grade disease.  Grafts showed the LIMA to LAD to be patent, VG to Dx had a known occlusion, VG to distal RVA/PL showed the proximal stents to be widely patent. The mid stented segment in the body of the SVG had a 99% hazy stenosis. He underwent PTCA/DES to mid body of VG to PDA.     His ejection fraction in April 2020 was 40 to 45%.  Other medical issues include insulin-dependent diabetes, obesity, sleep apnea on CPAP, and chronic renal insufficiency stage IIIb.  The patient was last admitted to [facility] in September 2020, he was seen by cardiology for CHF.  The patient had fallen at home and was found to be dehydrated and hypotensive with AKI. He had mildly elevated Troponin but is felt to be a poor candidate for cath and was treated medically.  His last OV with Dr [name] was in Nov.  The patient  lives at home with his wife.  His son has POA.  His chart indicates the patient is DNR. He was admitted to [facility] ED last PM with complaints of SOB and was in respiratory distress. Initial labs showed a BNP of 1086, SCr of 2.0, and a Troponin of 1519 (pk).  Echo showed a drop in his EF to 30-35% (40-45% in April 2020).  He was started on IV Heparin and IV Lasix and is transferred now for further evaluation.   When I examined the patient this morning he was on BiPap.  He was awake and alert and in no distress.  He denied chest pain to me- only complained of nausea.  When I asked if he had run out of any medications at home he said "only my Xanax".   Heart Pathway Score:     Past Medical History:  Diagnosis Date  . Allergic rhinitis   . Anxiety   . Arthritis   . COPD (chronic obstructive pulmonary disease) ([place])   . Coronary atherosclerosis of native coronary artery    a. CABG x 4 in 1994. Previous stent placement to SVG to distal RCA;  b. DES to native Cx in 2006;  c. DES SVG to RCA 05/08/11; d. Inf MI/Cath/PCI: LM 25, LAD 100, LCX patent stent, 50 into OM, RI 60-70, RCA 100, LIMA->LAD nl, VG->Diag 100 old, VG->RCA/PL 99 (DES x 1), EF 50-55%.  Marland Kitchen  Depression    With anxious features/hx panic attacks  . Essential hypertension, benign   . GERD (gastroesophageal reflux disease)   . Hyperlipidemia    Not on statin 2/2 hx of intolerance.   . Morbid obesity ([place])   . Nephrolithiasis   . Pneumonia   . Sleep apnea   . ST elevation myocardial infarction (STEMI) of inferior wall ([place])    4/14 - DES SVG to PDA  . Type 2 diabetes mellitus ([place])     Past Surgical History:  Procedure Laterality Date  . CORONARY ARTERY BYPASS GRAFT  1994  . EYE SURGERY    . LEFT HEART CATHETERIZATION WITH CORONARY ANGIOGRAM N/A 05/08/2011   Procedure: LEFT HEART CATHETERIZATION WITH CORONARY ANGIOGRAM;  Surgeon: [name], MD;  Location: [facility] CATH LAB;  Service: Cardiovascular;  Laterality: N/A;  . LEFT HEART  CATHETERIZATION WITH CORONARY ANGIOGRAM N/A 08/08/2012   Procedure: LEFT HEART CATHETERIZATION WITH CORONARY ANGIOGRAM;  Surgeon: [name], MD;  Location: [facility] CATH LAB;  Service: Cardiovascular;  Laterality: N/A;  . PERCUTANEOUS CORONARY STENT INTERVENTION (PCI-S) Right 05/08/2011   Procedure: PERCUTANEOUS CORONARY STENT INTERVENTION (PCI-S);  Surgeon: [name], MD;  Location: [facility] CATH LAB;  Service: Cardiovascular;  Laterality: Right;  . PERCUTANEOUS CORONARY STENT INTERVENTION (PCI-S) N/A 08/08/2012   Procedure: PERCUTANEOUS CORONARY STENT INTERVENTION (PCI-S);  Surgeon: [name], MD;  Location: [facility] CATH LAB;  Service: Cardiovascular;  Laterality: N/A;     Home Medications:  Prior to Admission medications   Medication Sig Start Date End Date Taking? Authorizing Provider  allopurinol (ZYLOPRIM) 100 MG tablet Take 50 mg by mouth every other day.   Yes [provider]  alprazolam Duanne Moron) 2 MG tablet Take 2 mg by mouth 2 (two) times daily. 03/14/19  Yes [provider]  aspirin EC 81 MG tablet Take 81 mg by mouth every morning.   Yes [provider]  clopidogrel (PLAVIX) 75 MG tablet TAKE 1 TABLET BY MOUTH EVERY DAY WITH BREAKFAST 11/28/18  Yes Satira Sark, MD  Dulaglutide (TRULICITY) [id number] [id number] SOPN Inject into the skin once a week. Every Friday.   Yes [provider]  escitalopram (LEXAPRO) 5 MG tablet Take 1 tablet by mouth daily. 11/29/18  Yes [provider]  hydrALAZINE (APRESOLINE) 10 MG tablet Take 2 tablets (20 mg total) by mouth 3 (three) times daily. 02/20/19  Yes Satira Sark, MD  insulin aspart (NOVOLOG) 100 UNIT/ML injection Inject 7 Units into the skin 3 (three) times daily with meals. Give only if eats 50% or more of meal. 08/17/18  Yes Gerlene Fee, NP  Insulin Glargine, 2 Unit Dial, (TOUJEO MAX SOLOSTAR) 300 UNIT/ML SOPN Inject 60 Units into the skin at bedtime. 08/17/18  Yes Gerlene Fee, NP   isosorbide mononitrate (IMDUR) 120 MG 24 hr tablet Take 1 tablet (120 mg total) by mouth daily. 02/08/19  Yes Satira Sark, MD  magnesium oxide (MAG-OX) 400 (241.3 Mg) MG tablet Take 1 tablet (400 mg total) by mouth daily. 12/25/18  Yes Tat, Shanon Brow, MD  metoprolol tartrate (LOPRESSOR) 50 MG tablet TAKE 1 TABLET(50 MG) BY MOUTH TWICE DAILY Patient taking differently: Take 50 mg by mouth 2 (two) times daily.  10/19/18  Yes Satira Sark, MD  pantoprazole (PROTONIX) 40 MG tablet Take 1 tablet (40 mg total) by mouth daily. 12/24/18  Yes Tat, Shanon Brow, MD  psyllium (METAMUCIL) 58.6 % powder Take 2 tsp by mouth: Mix with 8 ounces liquid  and drink once a day   Yes [provider]  tamsulosin (FLOMAX) 0.4 MG CAPS capsule Take 1 capsule (0.4 mg total) by mouth every evening. 08/17/18  Yes Gerlene Fee, NP  torsemide (DEMADEX) 20 MG tablet Take 1 tablet (20 mg total) by mouth daily. Beginning 08/05/2018 10/26/18  Yes Barton Dubois, MD  acetaminophen (TYLENOL) 325 MG tablet Take 650 mg by mouth every 4 (four) hours as needed.    [provider]  nitroGLYCERIN (NITROLINGUAL) 0.4 MG/SPRAY spray Place 1 spray under the tongue every 5 (five) minutes x 3 doses as needed for chest pain. 08/17/18   Gerlene Fee, NP  OXYGEN Inhale 2 L/min into the lungs continuous.    [provider]  UNABLE TO FIND CPAP from home while sleeping, at previous home settings    [provider]    Inpatient Medications: Scheduled Meds: . albuterol  6 puff Inhalation Once  . allopurinol  50 mg Oral QODAY  . aspirin EC  81 mg Oral q morning - 10a  . Chlorhexidine Gluconate Cloth  6 each Topical Daily  . citalopram  20 mg Oral Daily  . clopidogrel  75 mg Oral Daily  . furosemide  40 mg Intravenous Q12H  . hydrALAZINE  50 mg Oral TID  . insulin aspart  0-5 Units Subcutaneous QHS  . insulin aspart  0-9 Units Subcutaneous TID WC  . insulin aspart  2 Units Subcutaneous TID WC  . insulin  glargine  60 Units Subcutaneous QHS  . isosorbide mononitrate  120 mg Oral Daily  . magnesium oxide  400 mg Oral Daily  . metoprolol tartrate  50 mg Oral BID  . pantoprazole  40 mg Oral Daily  . sodium chloride flush  3 mL Intravenous Q12H  . tamsulosin  0.4 mg Oral QPM   Continuous Infusions: . sodium chloride    . cefTRIAXone (ROCEPHIN)  IV Stopped (03/24/2019 2011)  . heparin 1,250 Units/hr (04/01/19 0804)   PRN Meds: sodium chloride, acetaminophen **OR** acetaminophen, albuterol, ALPRAZolam, nitroGLYCERIN, ondansetron **OR** ondansetron (ZOFRAN) IV, polyethylene glycol, sodium chloride flush, traZODone  Allergies:    Allergies  Allergen Reactions  . Zocor [Simvastatin] Other (See Comments)    fatigue  . Lipitor [Atorvastatin] Other (See Comments)    Muscle cramping    Social History:   Social History   Socioeconomic History  . Marital status: Married    Spouse name: Not on file  . Number of children: Not on file  . Years of education: Not on file  . Highest education level: Not on file  Occupational History  . Not on file  Tobacco Use  . Smoking status: Never Smoker  . Smokeless tobacco: Never Used  Substance and Sexual Activity  . Alcohol use: No    Alcohol/week: 0.0 standard drinks  . Drug use: No  . Sexual activity: Never  Other Topics Concern  . Not on file  Social History Narrative  . Not on file   Social Determinants of Health   Financial Resource Strain:   . Difficulty of Paying Living Expenses: Not on file  Food Insecurity:   . Worried About Charity fundraiser in the Last Year: Not on file  . Ran Out of Food in the Last Year: Not on file  Transportation Needs:   . Lack of Transportation (Medical): Not on file  . Lack of Transportation (Non-Medical): Not on file  Physical Activity:   . Days of Exercise per Week: Not  on file  . Minutes of Exercise per Session: Not on file  Stress:   . Feeling of Stress : Not on file  Social Connections:   .  Frequency of Communication with Friends and Family: Not on file  . Frequency of Social Gatherings with Friends and Family: Not on file  . Attends Religious Services: Not on file  . Active Member of Clubs or Organizations: Not on file  . Attends Archivist Meetings: Not on file  . Marital Status: Not on file  Intimate Partner Violence:   . Fear of Current or Ex-Partner: Not on file  . Emotionally Abused: Not on file  . Physically Abused: Not on file  . Sexually Abused: Not on file    Family History:    Family History  Problem Relation Age of Onset  . Heart attack Mother        Age 59     ROS:  Please see the history of present illness.  All other ROS reviewed and negative.     Physical Exam/Data:   Vitals:   04/01/19 0354 04/01/19 0401 04/01/19 0648 04/01/19 0700  BP: (!) 109/44  112/60   Pulse: 95  90 86  Resp: 19   16  Temp: 99.6 F (37.6 C)     TempSrc: Oral     SpO2: 99%   99%  Weight:  121.6 kg    Height:        Intake/Output Summary (Last 24 hours) at 04/01/2019 0847 Last data filed at 04/01/2019 0600 Gross per 24 hour  Intake 3 ml  Output 0 ml  Net 3 ml   Last 3 Weights 04/01/2019 04/03/2019 03/16/2019  Weight (lbs) 268 lb 1.6 oz 252 lb 11.2 oz 260 lb  Weight (kg) 121.609 kg 114.624 kg 117.935 kg     Body mass index is 44.61 kg/m.  General:  Obese, chronically ill appearing male on BiPap HEENT: normal Lymph: no adenopathy Neck: no JVD Endocrine:  No thryomegaly Vascular: No carotid bruits  Cardiac:  normal S1, S2; RRR; no murmur  Lungs:  Decreased breath sounds, pt was unable sit up Abd: obese, soft, nontender, no hepatomegaly  Ext: 2-3+ puffy edema LE Musculoskeletal:  No deformities, BUE and BLE strength normal and equal Skin: pale, cool, and dry  Neuro:  CNs 2-12 intact, no focal abnormalities noted Psych:  Normal affect   EKG:  The EKG 03/16/2019 was personally reviewed and demonstrates:  NSR-HR 92, LPFB, inferior Qs, poor  anterior RW Telemetry:  Telemetry was personally reviewed and demonstrates:  NSR  Relevant CV Studies: Echo 03/28/2019- FINDINGS  Left Ventricle: Left ventricular ejection fraction, by visual estimation, is 30 to 35%. The left ventricle has moderate to severely decreased function. Definity contrast agent was given IV to delineate the left ventricular endocardial borders. The left  ventricle demonstrates regional wall motion abnormalities. The left ventricular internal cavity size was the left ventricle is normal in size. There is moderately increased left ventricular hypertrophy. Left ventricular diastolic parameters are  consistent with Grade II diastolic dysfunction (pseudonormalization).     Laboratory Data:  High Sensitivity Troponin:   Recent Labs  Lab 04/07/2019 0935 03/18/2019 1120 04/03/2019 2005 03/20/2019 2248 04/01/19 0630  TROPONINIHS 920* 1,049* 1,519* 1,365* 2,922*     Chemistry Recent Labs  Lab 04/12/2019 0935 03/15/2019 1120 04/01/19 0630  NA 135 136 138  K 4.8 4.7 5.5*  CL 101 100 103  CO2 26 26 18*  GLUCOSE 226* 231* 182*  BUN 40* 40* 48*  CREATININE 2.08* 2.11* 2.63*  CALCIUM 8.6* 8.6* 8.6*  GFRNONAA 29* 28* 22*  GFRAA 33* 33* 25*  ANIONGAP 8 10 17*    Recent Labs  Lab 04/01/2019 0935  PROT 6.9  ALBUMIN 3.6  AST 26  ALT 29  ALKPHOS 61  BILITOT 0.6   Hematology Recent Labs  Lab 03/17/2019 0935 04/01/19 0630  WBC 10.5 7.1  RBC 4.08* 3.89*  HGB 11.7* 11.1*  HCT 38.3* 38.6*  MCV 93.9 99.2  MCH 28.7 28.5  MCHC 30.5 28.8*  RDW 14.6 14.6  PLT 193 159   BNP Recent Labs  Lab 04/02/2019 0935  BNP 1,086.0*    DDimer No results for input(s): DDIMER in the last 168 hours.   Radiology/Studies:  DG Chest Port 1 View  Result Date: 03/29/2019 CLINICAL DATA:  Dyspnea, shortness of breath EXAM: PORTABLE CHEST 1 VIEW COMPARISON:  12/18/2018 FINDINGS: Bilateral interstitial and alveolar airspace opacities. Bilateral small pleural effusions. No pneumothorax.  Stable cardiomegaly. Prior CABG. No acute osseous abnormality. IMPRESSION: Bilateral interstitial and alveolar airspace opacities. Differential considerations include pulmonary edema versus multilobar pneumonia. Electronically Signed   By: Kathreen Devoid   On: 04/09/2019 10:13   ECHOCARDIOGRAM COMPLETE  Result Date: 04/08/2019   ECHOCARDIOGRAM REPORT   Patient Name:   JAYMASON COLQUHOUN Date of Exam: 03/23/2019 Medical Rec #:  [id number]        Height:       65.0 in Accession #:    [id number]       Weight:       252.0 lb Date of Birth:  05/02/1933        BSA:          2.18 m Patient Age:    65 years         BP:           136/77 mmHg Patient Gender: M                HR:           91 bpm. Exam Location:  [facility] Procedure: 2D Echo, Cardiac Doppler and Color Doppler Indications:    Dyspnea 786.09 / R06.00  History:        Patient has prior history of Echocardiogram examinations, most                 recent 07/21/2018. CAD and Previous Myocardial Infarction; Risk                 Factors:Hypertension, Diabetes and Dyslipidemia. Acute on                 chronic diastolic heart failure, Sleep apnea.  Sonographer:    Alvino Chapel RCS Referring Phys: [id number] [facility]  Sonographer Comments: In airborne precautions FINDINGS  Left Ventricle: Left ventricular ejection fraction, by visual estimation, is 30 to 35%. The left ventricle has moderate to severely decreased function. Definity contrast agent was given IV to delineate the left ventricular endocardial borders. The left ventricle demonstrates regional wall motion abnormalities. The left ventricular internal cavity size was the left ventricle is normal in size. There is moderately increased left ventricular hypertrophy. Left ventricular diastolic parameters are consistent with Grade II diastolic dysfunction (pseudonormalization).  LV Wall Scoring: The apical septal segment, mid inferolateral segment, apical inferior segment, and apex are akinetic. The entire anterior  wall, basal and mid anterolateral wall, basal and mid anterior septum, basal and mid inferior septum, basal and  mid inferior wall, basal inferolateral segment, and apical lateral segment are hypokinetic. Right Ventricle: The right ventricular size is normal. No increase in right ventricular wall thickness. Global RV systolic function is has normal systolic function. The tricuspid regurgitant velocity is 2.80 m/s, and with an assumed right atrial pressure  of 15 mmHg, the estimated right ventricular systolic pressure is moderately elevated at 46.4 mmHg. Left Atrium: Left atrial size was mild-moderately dilated. Right Atrium: Right atrial size was normal in size Pericardium: There is no evidence of pericardial effusion. Mitral Valve: The mitral valve is degenerative in appearance. There is mild calcification of the mitral valve leaflet(s). Mild mitral annular calcification. Trivial mitral valve regurgitation. Tricuspid Valve: The tricuspid valve is grossly normal. Tricuspid valve regurgitation is trivial. Aortic Valve: The aortic valve is tricuspid. Aortic valve regurgitation is not visualized. Mild to moderate aortic valve annular calcification. Pulmonic Valve: The pulmonic valve was grossly normal. Pulmonic valve regurgitation is trivial. Pulmonic regurgitation is trivial. Aorta: The aortic root is normal in size and structure. Venous: The inferior vena cava is dilated in size with less than 50% respiratory variability, suggesting right atrial pressure of 15 mmHg. IAS/Shunts: No atrial level shunt detected by color flow Doppler.  LEFT VENTRICLE PLAX 2D LVIDd:         5.01 cm LVIDs:         3.95 cm LV PW:         1.42 cm LV IVS:        1.44 cm LVOT diam:     2.10 cm LV SV:         51 ml LV SV Index:   21.65 LVOT Area:     3.46 cm  LEFT ATRIUM           Index LA diam:      4.50 cm 2.06 cm/m LA Vol (A2C): 91.0 ml 41.70 ml/m   AORTA Ao Root diam: 3.40 cm MV E velocity: 102.00 cm/s  103 cm/s  TRICUSPID VALVE MV A  velocity: 7570.00 cm/s 70.3 cm/s TV Peak grad:   313600.0 mmHg MV E/A ratio:  0.01         1.5       TV Vmax:        280.00 m/s                                       TR Peak grad:   31.4 mmHg                                       TR Vmax:        280.00 cm/s                                        SHUNTS                                       Systemic Diam: 2.10 cm  [name] MD Electronically signed by [name] MD Signature Date/Time: 04/06/2019/3:51:02 PM    Final        TIMI Risk Score for Unstable Angina or Non-ST Elevation  MI:   The patient's TIMI risk score is  , which indicates a  26% risk of all cause mortality, new or recurrent myocardial infarction or need for urgent revascularization in the next 14 days.   Assessment and Plan:   Acute on chronic combined CHF New LVD on echo, weight up 5 kg  NSTEMI HS Troponin peak 2922  Acute on chronic renal injury Admission SCr was 2.0- now 2.6 with K+ 5.5  Known CAD- CABG in '94, last PCI 2014- not felt to be a candidate for further invasive intervention.   DNR  Plan: MD to see- suspect medical Rx will be most appropriate Rx.       For questions or updates, please contact [place] Please consult [email or web address] for contact info under     Signed, Kerin Ransom, PA-C  04/01/2019 8:47 AM   Patient seen and examined with Kerin Ransom, PA-C.  Agree as above with the following exceptions or changes.  [name] is an 85 year old gentleman with a known history of CAD status post bypass grafting in 1994 with subsequent multiple interventions.  He is a patient of my partner Dr. [name] in [place].  Last coronary angiogram in 2014 in the setting of an NSTEMI at which time he underwent DES to the mid body of the vein graft to the PDA.  EF in April 2020 was 40 to 45%.  Multiple comorbidities including diabetes, obesity, sleep apnea, chronic renal insufficiency.  Last hospital admission in September for a fall with dehydration and  hypotension.  He was treated medically at this time.  He presented short of breath to the [facility], ED yesterday evening and was found to be in respiratory distress.  Respiratory panel negative.  Currently on BiPAP unable to be weaned at the moment.  Patient complains of lower abdominal pain over his bladder.  Foley in place.  Creatinine 2.6 up from a baseline of approximately 1.9 in the past 12 months, potassium 5.5.  BNP 1000, troponin  [phone]--2900 approximately.  Hemoglobin 11.1, platelets 159.  Normal white blood cell count.  On exam with BiPAP in place in mild respiratory distress.  Cardiovascular regular rate and rhythm, no murmurs, lungs bilateral crackles anteriorly, lower extremities 2-3+ bilateral pitting edema to the knee.  Neuro grossly nonfocal.  Psych normal mood and affect alert and oriented x3.  Principal Problem:   Acute on chronic HFrEF (heart failure with reduced ejection fraction) (HCC) Active Problems:   Coronary artery disease involving native coronary artery of native heart with unstable angina pectoris (HCC)   Sleep apnea   Essential hypertension, benign   Obesity, Class III, BMI 40-49.9 (morbid obesity) ([place])   Type 2 diabetes mellitus with stage 3 chronic kidney disease, with long-term current use of insulin (HCC)   CAD (coronary artery disease) of artery bypass graft   Chronic combined systolic and diastolic CHF (congestive heart failure) (HCC)   CKD stage 3 secondary to diabetes (HCC)   Elevated troponin   Acute exacerbation of CHF (congestive heart failure) (HCC)  Echocardiogram demonstrates worsening of left ventricular ejection fraction to 30 to 35% with regional wall motion abnormalities.  The previous echocardiogram is challenging to interpret due to limited images.  ECGs compared yesterday and September 2020 with no significant change, findings suggest inferior Q waves with ST changes, and poor R wave progression anteriorly.  High lateral lead ST  depressions seen on today's ECG are present in September as well, but were not present in July.  The  patient appears to be in decompensated heart failure at the moment.  He is receiving 40 mg of IV Lasix twice daily, however with his acute renal failure I suspect this may not be large enough dose.  We will administer Lasix challenge of 120 mg IV and monitor for response.  His renal dysfunction may be cardiorenal in nature.  I am hopeful he will open up after a larger dose of Lasix.  Acute on chronic combined systolic and diastolic heart failure- -123456 mg IV Lasix now.  I have communicated this to the patient's nurse Beverlee Nims. -Strict ins and outs and daily weights.  NSTEMI -Continue aspirin, Plavix, IV heparin -With acute on chronic renal failure and known complex disease in the setting of decompensated heart failure, medical management would be the appropriate strategy today.  If he improves from a breathing standpoint and renal function is better, could consider coronary angiography during this hospitalization. -If needed, nitroglycerin drip for chest pain.  Acute on chronic renal failure- -This may be cardiorenal in nature, we will give a Lasix challenge. -Recommend nephrology consultation  Elouise Munroe 10:31 AM 04/01/19

## 2019-04-02 ENCOUNTER — Inpatient Hospital Stay (HOSPITAL_COMMUNITY): Payer: PPO

## 2019-04-02 LAB — CBC WITH DIFFERENTIAL/PLATELET
Abs Immature Granulocytes: 0.07 10*3/uL (ref 0.00–0.07)
Basophils Absolute: 0 10*3/uL (ref 0.0–0.1)
Basophils Relative: 0 %
Eosinophils Absolute: 0 10*3/uL (ref 0.0–0.5)
Eosinophils Relative: 0 %
HCT: 37.5 % — ABNORMAL LOW (ref 39.0–52.0)
Hemoglobin: 11.5 g/dL — ABNORMAL LOW (ref 13.0–17.0)
Immature Granulocytes: 1 %
Lymphocytes Relative: 7 %
Lymphs Abs: 0.7 10*3/uL (ref 0.7–4.0)
MCH: 28.8 pg (ref 26.0–34.0)
MCHC: 30.7 g/dL (ref 30.0–36.0)
MCV: 93.8 fL (ref 80.0–100.0)
Monocytes Absolute: 1 10*3/uL (ref 0.1–1.0)
Monocytes Relative: 10 %
Neutro Abs: 8.1 10*3/uL — ABNORMAL HIGH (ref 1.7–7.7)
Neutrophils Relative %: 82 %
Platelets: 203 10*3/uL (ref 150–400)
RBC: 4 MIL/uL — ABNORMAL LOW (ref 4.22–5.81)
RDW: 14.7 % (ref 11.5–15.5)
WBC: 10 10*3/uL (ref 4.0–10.5)
nRBC: 0 % (ref 0.0–0.2)

## 2019-04-02 LAB — BASIC METABOLIC PANEL
Anion gap: 12 (ref 5–15)
BUN: 59 mg/dL — ABNORMAL HIGH (ref 8–23)
CO2: 24 mmol/L (ref 22–32)
Calcium: 8.6 mg/dL — ABNORMAL LOW (ref 8.9–10.3)
Chloride: 98 mmol/L (ref 98–111)
Creatinine, Ser: 3.46 mg/dL — ABNORMAL HIGH (ref 0.61–1.24)
GFR calc Af Amer: 18 mL/min — ABNORMAL LOW (ref >60)
GFR calc non Af Amer: 15 mL/min — ABNORMAL LOW (ref >60)
Glucose, Bld: 128 mg/dL — ABNORMAL HIGH (ref 70–99)
Potassium: 5.5 mmol/L — ABNORMAL HIGH (ref 3.5–5.1)
Sodium: 134 mmol/L — ABNORMAL LOW (ref 135–145)

## 2019-04-02 LAB — GLUCOSE, CAPILLARY
Glucose-Capillary: 113 mg/dL — ABNORMAL HIGH (ref 70–99)
Glucose-Capillary: 129 mg/dL — ABNORMAL HIGH (ref 70–99)
Glucose-Capillary: 92 mg/dL (ref 70–99)
Glucose-Capillary: 84 mg/dL (ref 70–99)

## 2019-04-02 LAB — URINALYSIS, ROUTINE W REFLEX MICROSCOPIC
Bacteria, UA: NONE SEEN
Bilirubin Urine: NEGATIVE
Glucose, UA: NEGATIVE mg/dL
Ketones, ur: NEGATIVE mg/dL
Nitrite: NEGATIVE
Protein, ur: 30 mg/dL — AB
RBC / HPF: >50 RBC/hpf — ABNORMAL HIGH (ref 0–5)
Specific Gravity, Urine: 1.013 (ref 1.005–1.030)
pH: 5 (ref 5.0–8.0)

## 2019-04-02 LAB — HEPARIN LEVEL (UNFRACTIONATED): Heparin Unfractionated: 0.36 IU/mL (ref 0.30–0.70)

## 2019-04-02 LAB — NA AND K (SODIUM & POTASSIUM), RAND UR
Potassium Urine: 43 mmol/L
Sodium, Ur: 10 mmol/L

## 2019-04-02 LAB — MRSA PCR SCREENING: MRSA by PCR: POSITIVE — AB

## 2019-04-02 MED ORDER — MUPIROCIN 2 % EX OINT
1.0000 "application " | TOPICAL_OINTMENT | Freq: Two times a day (BID) | CUTANEOUS | Status: DC
  Administered 2019-04-03 – 2019-04-04 (×4): 1 via NASAL
  Filled 2019-04-02 (×2): qty 22

## 2019-04-02 MED ORDER — CHLORHEXIDINE GLUCONATE CLOTH 2 % EX PADS
6.0000 | MEDICATED_PAD | Freq: Every day | CUTANEOUS | Status: DC
  Administered 2019-04-03 – 2019-04-04 (×2): 6 via TOPICAL

## 2019-04-02 MED ORDER — BENZONATATE 100 MG PO CAPS
200.0000 mg | ORAL_CAPSULE | Freq: Two times a day (BID) | ORAL | Status: DC | PRN
  Administered 2019-04-02 – 2019-04-03 (×2): 200 mg via ORAL
  Filled 2019-04-02 (×2): qty 2

## 2019-04-02 MED ORDER — FUROSEMIDE 10 MG/ML IJ SOLN
120.0000 mg | Freq: Three times a day (TID) | INTRAVENOUS | Status: DC
  Administered 2019-04-02 – 2019-04-05 (×8): 120 mg via INTRAVENOUS
  Filled 2019-04-02: qty 12
  Filled 2019-04-02: qty 10
  Filled 2019-04-02: qty 12
  Filled 2019-04-02: qty 10
  Filled 2019-04-02 (×2): qty 2
  Filled 2019-04-02 (×2): qty 12
  Filled 2019-04-02: qty 10
  Filled 2019-04-02 (×2): qty 12

## 2019-04-02 MED ORDER — SODIUM ZIRCONIUM CYCLOSILICATE 10 G PO PACK
10.0000 g | PACK | Freq: Once | ORAL | Status: AC
  Administered 2019-04-02: 17:00:00 10 g via ORAL
  Filled 2019-04-02: qty 1

## 2019-04-02 MED ORDER — AEROCHAMBER PLUS FLO-VU MEDIUM MISC
1.0000 | Freq: Once | Status: DC
  Filled 2019-04-02: qty 1

## 2019-04-02 MED ORDER — AEROCHAMBER PLUS FLO-VU LARGE MISC
1.0000 | Freq: Once | Status: DC
  Filled 2019-04-02: qty 1

## 2019-04-02 NOTE — Consult Note (Signed)
[facility] Nephrology Consultation Note  Requesting MD: Dr Cristal Deer Reason for consult: AKI on CKD   HPI:  [name] is a 85 y.o. male with history of HTN, DM, HLD, obesity, sleep apnea, CAD status post CABG, COPD, CKD, chronic respiratory failure on 2 L,systolic CHF with EF of 30 to 35% admitted with acute CHF exacerbation seen as a consultation at the request of Dr. Kyung Bacca for AKI on CKD and fluid overload. The patient initially presented to [facility] with worsening shortness of breath, hypoxia.  In the ED his oxygen saturation was 72% in room air which required use of nonrebreather. Patient was seen by cardiologist.  The echocardiogram showed drop in EF to 30 to 35%.  Started on IV Lasix 40 mg twice daily with minimal urine output however the labs showed continued to rise in creatinine level.  He received extra dose of Lasix 120 mg yesterday with minimal response.  Started IV heparin for possible CAD in the setting of rising troponin level.  He has CKD stage III with baseline creatinine level around 2.  He has multiple episodes of AKI on CKD in the setting of CHF exacerbation.  The creatinine level was 2.08 during admission which is continue to elevate 3.46 today.  Potassium level 5.5, CO2 24 and sodium 134.  A1c 7.4.  He takes torsemide 20 mg at home.  During evaluation patient reports shortness of breath and not feeling well.  He is on high flow oxygen.  Denied chest pain, nausea, vomiting, headache, dizziness.  No dysuria or pelvic discomfort.  Urine output is only recorded 350 cc.  PMHx:   Past Medical History:  Diagnosis Date  . Allergic rhinitis   . Anxiety   . Arthritis   . COPD (chronic obstructive pulmonary disease) ([place])   . Coronary atherosclerosis of native coronary artery    a. CABG x 4 in 1994. Previous stent placement to SVG to distal RCA;  b. DES to native Cx in 2006;  c. DES SVG to RCA 05/08/11; d. Inf MI/Cath/PCI: LM 25, LAD 100, LCX  patent stent, 50 into OM, RI 60-70, RCA 100, LIMA->LAD nl, VG->Diag 100 old, VG->RCA/PL 99 (DES x 1), EF 50-55%.  . Depression    With anxious features/hx panic attacks  . Essential hypertension, benign   . GERD (gastroesophageal reflux disease)   . Hyperlipidemia    Not on statin 2/2 hx of intolerance.   . Morbid obesity ([place])   . Nephrolithiasis   . Pneumonia   . Sleep apnea   . ST elevation myocardial infarction (STEMI) of inferior wall ([place])    4/14 - DES SVG to PDA  . Type 2 diabetes mellitus ([place])     Past Surgical History:  Procedure Laterality Date  . CORONARY ARTERY BYPASS GRAFT  1994  . EYE SURGERY    . LEFT HEART CATHETERIZATION WITH CORONARY ANGIOGRAM N/A 05/08/2011   Procedure: LEFT HEART CATHETERIZATION WITH CORONARY ANGIOGRAM;  Surgeon: [name], MD;  Location: [facility] CATH LAB;  Service: Cardiovascular;  Laterality: N/A;  . LEFT HEART CATHETERIZATION WITH CORONARY ANGIOGRAM N/A 08/08/2012   Procedure: LEFT HEART CATHETERIZATION WITH CORONARY ANGIOGRAM;  Surgeon: [name], MD;  Location: [facility] CATH LAB;  Service: Cardiovascular;  Laterality: N/A;  . PERCUTANEOUS CORONARY STENT INTERVENTION (PCI-S) Right 05/08/2011   Procedure: PERCUTANEOUS CORONARY STENT INTERVENTION (PCI-S);  Surgeon: [name], MD;  Location: [facility] CATH LAB;  Service: Cardiovascular;  Laterality: Right;  . PERCUTANEOUS  CORONARY STENT INTERVENTION (PCI-S) N/A 08/08/2012   Procedure: PERCUTANEOUS CORONARY STENT INTERVENTION (PCI-S);  Surgeon: [name], MD;  Location: [facility] CATH LAB;  Service: Cardiovascular;  Laterality: N/A;    Family Hx:  Family History  Problem Relation Age of Onset  . Heart attack Mother        Age 52    Social History:  reports that he has never smoked. He has never used smokeless tobacco. He reports that he does not drink alcohol or use drugs.  Allergies:  Allergies  Allergen Reactions  . Zocor [Simvastatin] Other (See Comments)    fatigue  .  Lipitor [Atorvastatin] Other (See Comments)    Muscle cramping    Medications: Prior to Admission medications   Medication Sig Start Date End Date Taking? Authorizing Provider  allopurinol (ZYLOPRIM) 100 MG tablet Take 50 mg by mouth every other day.   Yes [provider]  alprazolam Duanne [place]on) 2 MG tablet Take 2 mg by mouth 2 (two) times daily. 03/14/19  Yes [provider]  aspirin EC 81 MG tablet Take 81 mg by mouth every morning.   Yes [provider]  clopidogrel (PLAVIX) 75 MG tablet TAKE 1 TABLET BY MOUTH EVERY DAY WITH BREAKFAST 11/28/18  Yes Satira Sark, MD  Dulaglutide (TRULICITY) [id number] [id number] SOPN Inject into the skin once a week. Every Friday.   Yes [provider]  escitalopram (LEXAPRO) 5 MG tablet Take 1 tablet by mouth daily. 11/29/18  Yes [provider]  hydrALAZINE (APRESOLINE) 10 MG tablet Take 2 tablets (20 mg total) by mouth 3 (three) times daily. 02/20/19  Yes Satira Sark, MD  insulin aspart (NOVOLOG) 100 UNIT/ML injection Inject 7 Units into the skin 3 (three) times daily with meals. Give only if eats 50% or more of meal. 08/17/18  Yes Gerlene Fee, NP  Insulin Glargine, 2 Unit Dial, (TOUJEO MAX SOLOSTAR) 300 UNIT/ML SOPN Inject 60 Units into the skin at bedtime. 08/17/18  Yes Gerlene Fee, NP  isosorbide mononitrate (IMDUR) 120 MG 24 hr tablet Take 1 tablet (120 mg total) by mouth daily. 02/08/19  Yes Satira Sark, MD  magnesium oxide (MAG-OX) 400 (241.3 Mg) MG tablet Take 1 tablet (400 mg total) by mouth daily. 12/25/18  Yes Tat, Shanon Brow, MD  metoprolol tartrate (LOPRESSOR) 50 MG tablet TAKE 1 TABLET(50 MG) BY MOUTH TWICE DAILY Patient taking differently: Take 50 mg by mouth 2 (two) times daily.  10/19/18  Yes Satira Sark, MD  pantoprazole (PROTONIX) 40 MG tablet Take 1 tablet (40 mg total) by mouth daily. 12/24/18  Yes Tat, Shanon Brow, MD  psyllium (METAMUCIL) 58.6 % powder Take 2 tsp by mouth: Mix with 8  ounces liquid and drink once a day   Yes [provider]  tamsulosin (FLOMAX) 0.4 MG CAPS capsule Take 1 capsule (0.4 mg total) by mouth every evening. 08/17/18  Yes Gerlene Fee, NP  torsemide (DEMADEX) 20 MG tablet Take 1 tablet (20 mg total) by mouth daily. Beginning 08/05/2018 10/26/18  Yes Barton Dubois, MD  acetaminophen (TYLENOL) 325 MG tablet Take 650 mg by mouth every 4 (four) hours as needed.    [provider]  nitroGLYCERIN (NITROLINGUAL) 0.4 MG/SPRAY spray Place 1 spray under the tongue every 5 (five) minutes x 3 doses as needed for chest pain. 08/17/18   Gerlene Fee, NP  OXYGEN Inhale 2 L/min into the lungs continuous.    [provider]  UNABLE TO FIND CPAP  from home while sleeping, at previous home settings    [provider]    I have reviewed the patient's current medications.  Labs:  Results for orders placed or performed during the hospital encounter of 04/11/2019 (from the past 48 hour(s))  CBG monitoring, ED     Status: Abnormal   Collection Time: 04/04/2019  7:55 PM  Result Value Ref Range   Glucose-Capillary 213 (H) 70 - 99 mg/dL   Comment 1 Notify RN    Comment 2 Document in Chart   Troponin I (High Sensitivity)     Status: Abnormal   Collection Time: 03/16/2019  8:05 PM  Result Value Ref Range   Troponin I (High Sensitivity) 1,519 (HH) <18 ng/L    Comment: CRITICAL RESULT CALLED TO, READ BACK BY AND VERIFIED WITH: EASTER,T ON 04/13/2019 AT 2145 BY LOY,C (NOTE) Elevated high sensitivity troponin I (hsTnI) values and significant  changes across serial measurements may suggest ACS but many other  chronic and acute conditions are known to elevate hsTnI results.  Refer to the Links section for chest pain algorithms and additional  guidance. Performed at [facility], [address], [place], [place] [address]   Respiratory Panel by RT PCR (Flu A&B, Covid) - Nasopharyngeal Swab     Status: None   Collection Time: 03/29/2019  8:16 PM    Specimen: Nasopharyngeal Swab  Result Value Ref Range   SARS Coronavirus 2 by RT PCR NEGATIVE NEGATIVE    Comment: (NOTE) SARS-CoV-2 target nucleic acids are NOT DETECTED. The SARS-CoV-2 RNA is generally detectable in upper respiratoy specimens during the acute phase of infection. The lowest concentration of SARS-CoV-2 viral copies this assay can detect is 131 copies/mL. A negative result does not preclude SARS-Cov-2 infection and should not be used as the sole basis for treatment or other patient management decisions. A negative result may occur with  improper specimen collection/handling, submission of specimen other than nasopharyngeal swab, presence of viral mutation(s) within the areas targeted by this assay, and inadequate number of viral copies (<131 copies/mL). A negative result must be combined with clinical observations, patient history, and epidemiological information. The expected result is Negative. Fact Sheet for Patients:  PinkCheek.be Fact Sheet for Healthcare Providers:  GravelBags.it This test is not yet ap proved or cleared by the [place] FDA and  has been authorized for detection and/or diagnosis of SARS-CoV-2 by FDA under an Emergency Use Authorization (EUA). This EUA will remain  in effect (meaning this test can be used) for the duration of the COVID-19 declaration under Section 564(b)(1) of the Act, 21 U.S.C. section 360bbb-3(b)(1), unless the authorization is terminated or revoked sooner.    Influenza A by PCR NEGATIVE NEGATIVE   Influenza B by PCR NEGATIVE NEGATIVE    Comment: (NOTE) The Xpert Xpress SARS-CoV-2/FLU/RSV assay is intended as an aid in  the diagnosis of influenza from Nasopharyngeal swab specimens and  should not be used as a sole basis for treatment. Nasal washings and  aspirates are unacceptable for Xpert Xpress SARS-CoV-2/FLU/RSV  testing. Fact Sheet for  Patients: PinkCheek.be Fact Sheet for Healthcare Providers: GravelBags.it This test is not yet approved or cleared by the [place] FDA and  has been authorized for detection and/or diagnosis of SARS-CoV-2 by  FDA under an Emergency Use Authorization (EUA). This EUA will remain  in effect (meaning this test can be used) for the duration of the  Covid-19 declaration under Section 564(b)(1) of the Act, 21  U.S.C. section 360bbb-3(b)(1),  unless the authorization is  terminated or revoked. Performed at [facility], [address], [place], [place] [address]   CBG monitoring, ED     Status: Abnormal   Collection Time: 03/16/2019  9:42 PM  Result Value Ref Range   Glucose-Capillary 213 (H) 70 - 99 mg/dL   Comment 1 Document in Chart   Troponin I (High Sensitivity)     Status: Abnormal   Collection Time: 04/01/2019 10:48 PM  Result Value Ref Range   Troponin I (High Sensitivity) 1,365 (HH) <18 ng/L    Comment: CRITICAL RESULT CALLED TO, READ BACK BY AND VERIFIED WITH: ANDREWS,L. AT 0012 ON 04/01/2019 (NOTE) Elevated high sensitivity troponin I (hsTnI) values and significant  changes across serial measurements may suggest ACS but many other  chronic and acute conditions are known to elevate hsTnI results.  Refer to the Links section for chest pain algorithms and additional  guidance. Performed at [facility], [address], [place], [place] [address]   Glucose, capillary     Status: Abnormal   Collection Time: 04/01/19  6:26 AM  Result Value Ref Range   Glucose-Capillary 137 (H) 70 - 99 mg/dL   Comment 1 Notify RN    Comment 2 Document in Chart   Heparin level (unfractionated)     Status: Abnormal   Collection Time: 04/01/19  6:30 AM  Result Value Ref Range   Heparin Unfractionated 0.20 (L) 0.30 - 0.70 IU/mL    Comment: (NOTE) If heparin results are below expected values, and patient dosage has  been confirmed, suggest  follow up testing of antithrombin III levels. Performed at [facility], [place] [address] [place], [place] [address]   CBC     Status: Abnormal   Collection Time: 04/01/19  6:30 AM  Result Value Ref Range   WBC 7.1 4.0 - 10.5 K/uL   RBC 3.89 (L) 4.22 - 5.81 MIL/uL   Hemoglobin 11.1 (L) 13.0 - 17.0 g/dL   HCT 38.6 (L) 39.0 - 52.0 %   MCV 99.2 80.0 - 100.0 fL   MCH 28.5 26.0 - 34.0 pg   MCHC 28.8 (L) 30.0 - 36.0 g/dL   RDW 14.6 11.5 - 15.5 %   Platelets 159 150 - 400 K/uL   nRBC 0.0 0.0 - 0.2 %    Comment: Performed at [facility], [place] [address], [place], [place] [id number]  Basic metabolic panel     Status: Abnormal   Collection Time: 04/01/19  6:30 AM  Result Value Ref Range   Sodium 138 135 - 145 mmol/L   Potassium 5.5 (H) 3.5 - 5.1 mmol/L   Chloride 103 98 - 111 mmol/L   CO2 18 (L) 22 - 32 mmol/L   Glucose, Bld 182 (H) 70 - 99 mg/dL   BUN 48 (H) 8 - 23 mg/dL   Creatinine, Ser 2.63 (H) 0.61 - 1.24 mg/dL   Calcium 8.6 (L) 8.9 - 10.3 mg/dL   GFR calc non Af Amer 22 (L) >60 mL/min   GFR calc Af Amer 25 (L) >60 mL/min   Anion gap 17 (H) 5 - 15    Comment: Performed at [facility] [address], [place], [place] [address]  Troponin I (High Sensitivity)     Status: Abnormal   Collection Time: 04/01/19  6:30 AM  Result Value Ref Range   Troponin I (High Sensitivity) 2,922 (HH) <18 ng/L    Comment: CRITICAL RESULT CALLED TO, READ BACK BY  AND VERIFIED WITH: [facility] RN @ [phone] 04/01/2019 BY C.EDENS Performed at [facility], [place] [address] [place], [place] [address]   Glucose, capillary     Status: Abnormal   Collection Time: 04/01/19 12:21 PM  Result Value Ref Range   Glucose-Capillary 144 (H) 70 - 99 mg/dL   Comment 1 Notify RN    Comment 2 Document in Chart   Heparin level (unfractionated)     Status: None   Collection Time: 04/01/19  3:59 PM  Result Value Ref Range   Heparin Unfractionated 0.48 0.30 - 0.70 IU/mL    Comment: (NOTE) If heparin  results are below expected values, and patient dosage has  been confirmed, suggest follow up testing of antithrombin III levels. Performed at [facility], [place] [address] [place], [place] [address]   Glucose, capillary     Status: Abnormal   Collection Time: 04/01/19  4:38 PM  Result Value Ref Range   Glucose-Capillary 140 (H) 70 - 99 mg/dL   Comment 1 Notify RN    Comment 2 Document in Chart   Glucose, capillary     Status: Abnormal   Collection Time: 04/01/19  9:12 PM  Result Value Ref Range   Glucose-Capillary 137 (H) 70 - 99 mg/dL   Comment 1 Notify RN    Comment 2 Document in Chart   Heparin level (unfractionated)     Status: None   Collection Time: 04/02/19  4:41 AM  Result Value Ref Range   Heparin Unfractionated 0.36 0.30 - 0.70 IU/mL    Comment: (NOTE) If heparin results are below expected values, and patient dosage has  been confirmed, suggest follow up testing of antithrombin III levels. Performed at [facility], [place] [address] [place], [place] [address]   CBC with Differential/Platelet     Status: Abnormal   Collection Time: 04/02/19  4:41 AM  Result Value Ref Range   WBC 10.0 4.0 - 10.5 K/uL   RBC 4.00 (L) 4.22 - 5.81 MIL/uL   Hemoglobin 11.5 (L) 13.0 - 17.0 g/dL   HCT 37.5 (L) 39.0 - 52.0 %   MCV 93.8 80.0 - 100.0 fL   MCH 28.8 26.0 - 34.0 pg   MCHC 30.7 30.0 - 36.0 g/dL   RDW 14.7 11.5 - 15.5 %   Platelets 203 150 - 400 K/uL   nRBC 0.0 0.0 - 0.2 %   Neutrophils Relative % 82 %   Neutro Abs 8.1 (H) 1.7 - 7.7 K/uL   Lymphocytes Relative 7 %   Lymphs Abs 0.7 0.7 - 4.0 K/uL   Monocytes Relative 10 %   Monocytes Absolute 1.0 0.1 - 1.0 K/uL   Eosinophils Relative 0 %   Eosinophils Absolute 0.0 0.0 - 0.5 K/uL   Basophils Relative 0 %   Basophils Absolute 0.0 0.0 - 0.1 K/uL   Immature Granulocytes 1 %   Abs Immature Granulocytes 0.07 0.00 - 0.07 K/uL    Comment: Performed at [facility], [place] [address], [place], [place] [id number]   Basic metabolic panel     Status: Abnormal   Collection Time: 04/02/19  4:41 AM  Result Value Ref Range   Sodium 134 (L) 135 - 145 mmol/L   Potassium 5.5 (H) 3.5 - 5.1 mmol/L   Chloride 98 98 - 111 mmol/L   CO2 24 22 - 32 mmol/L   Glucose, Bld 128 (H) 70 - 99 mg/dL   BUN 59 (H) 8 - 23  mg/dL   Creatinine, Ser 3.46 (H) 0.61 - 1.24 mg/dL   Calcium 8.6 (L) 8.9 - 10.3 mg/dL   GFR calc non Af Amer 15 (L) >60 mL/min   GFR calc Af Amer 18 (L) >60 mL/min   Anion gap 12 5 - 15    Comment: Performed at [facility] [address] [place],  [address]  Glucose, capillary     Status: Abnormal   Collection Time: 04/02/19  6:05 AM  Result Value Ref Range   Glucose-Capillary 113 (H) 70 - 99 mg/dL   Comment 1 Notify RN    Comment 2 Document in Chart   Glucose, capillary     Status: Abnormal   Collection Time: 04/02/19 11:10 AM  Result Value Ref Range   Glucose-Capillary 129 (H) 70 - 99 mg/dL   Comment 1 Notify RN    Comment 2 Document in Chart      ROS:  Pertinent items noted in HPI and remainder of comprehensive ROS otherwise negative.  Physical Exam: Vitals:   04/02/19 0751 04/02/19 0808  BP:  (!) 111/48  Pulse:  87  Resp:    Temp:    SpO2: 97% 92%     General exam: Elderly male, on high flow oxygen, not in apparent distress Respiratory system: Bibasal coarse breath sound, no wheezing  cardiovascular system: S1 & S2 heard, RRR, no rub.  Lower extremity pitting edema up to thigh Gastrointestinal system: Abdomen is soft and nontender. Normal bowel sounds heard. Central nervous system: Alert and oriented. No asterixis Extremities: Edema.  No cyanosis or clubbing Skin: No rashes, lesions or ulcers Psychiatry: Judgement and insight appear normal. Mood & affect appropriate.   Assessment/Plan:  #AKI on CKD, nonoliguric likely cardiorenal syndrome with reduced effective renal perfusion.  UA was cloudy some protein and RBC unknown if it is traumatic.  Recently the urine  was normal.  Doubt it is GN. He has significant volume overload.  I will increase the dose of Lasix to 120 mg IV every 8 hourly. Order bladder scan, strict ins and out, US renal. Avoid IV contrast Hopefully the kidney function will improve with diuresis.  I do not think he is a candidate for long-term outpatient dialysis.  No indication for HD currently.  Noted he is DNR.  #Hyperkalemia: In the setting of AKI.  Diuretics will help.  I will order a dose of Lokelma.  #Acute CHF exacerbation/and NSTEMI: IV heparin and cardiology is following.  LVEF 30 to 35%.  #Acute on chronic hypoxic respiratory failure with pulmonary edema: Diuresis as above.  Thank you for the consult.  Will follow with you.  Sidhant Helderman Tanna Furry 04/02/2019, 3:41 PM  [employer].

## 2019-04-02 NOTE — Progress Notes (Signed)
Bladder scan performed- 0 ml Pulled Urinary catheter at 6:25 pm.  Condom catheter placed.  Will continue to monitor output.

## 2019-04-02 NOTE — Progress Notes (Addendum)
Patient sitting on side of bed-  Slid himself over to chair.  Pulled his IV out in right hand,    2nd IV leaking in left and O2 fell off.      Provided an extension for HFNC,  Redressed IV, clean patient up (CHG bath, new gown, beding and sock).  Patient wanted to get back in bed- assisted back to bed with walker. Patient very weak and SOB.     Held sliding scale and meal due to lack of appetite and not eating.  Concerned for blood sugar dropping

## 2019-04-02 NOTE — Progress Notes (Signed)
PROGRESS NOTE  [name] [id number] DOB: [birth year] DOA: 03/17/2019 PCP: Rosalee Kaufman, PA-C  HPI/Recap of past 24 hours: [name] is an 85 year old male with past medical history of coronary artery disease prior CABG 1994, COPD, CHF, with reduced ejection fraction, type 2 diabetes mellitus, obesity, hypertension, hyperlipidemia, sleep apnea, chronic kidney disease stage III, who initially presented to [facility] with shortness of breath and was transferred to [facility] due drop in ejection fraction on echo from 40 -45% now to 30 -35%, new finding of regional wall motion abnormalities, worsening dyspnea with worsening hypoxia and elevated troponin in the 2000 as well as elevated BNP patient was transferred to [facility] by admitting MD after discussion with cardiologist.     Subjective: Patient seen and examined at bedside he complains he is not feeling.  He is complaining of pain and Tylenol is not helping requesting something stronger for pain.  Patient is currently on nasal cannula high flow at 15 L/min.  April 02, 2019: Subjective: Patient seen and examined at bedside.  He is complaining of feeling anxious and requesting something to help calm him down.  He is creatinine is worsening and he is does not have any urine output in the last couple of hours.  Despite Lasix challenge by cardiology.  Cardiologist recommending neurology consult.  Neurology has been consulted.   Assessment/Plan: Principal Problem:   Acute on chronic HFrEF (heart failure with reduced ejection fraction) (HCC) Active Problems:   Coronary artery disease involving native coronary artery of native heart with unstable angina pectoris (HCC)   Sleep apnea   Essential hypertension, benign   Obesity, Class III, BMI 40-49.9 (morbid obesity) ([place])   Type 2 diabetes mellitus with stage 3 chronic kidney disease, with long-term current use of insulin (HCC)   CAD (coronary artery disease) of artery  bypass graft   Chronic combined systolic and diastolic CHF (congestive heart failure) (HCC)   CKD stage 3 secondary to diabetes (HCC)   Elevated troponin   Acute exacerbation of CHF (congestive heart failure) ([place])   #1 non-STEMI/ACS with troponin this morning of 2922. I have discussed with cardiologist Dr. Claiborne Billings who confirmed he is aware of this consult and transfer.  2.  HFrEF.  Acute on chronic combined systolic and diastolic congestive heart failure exacerbation in the setting of possible ACS.  His most recent echo shows much reduced ejection fraction of 30 to 35%. Continue IV diuresis with Lasix Continue IV heparin Continue isosorbide combo Daily weight and fluid input and output monitoring  Cardiology has been consulted  3.  Acute on chronic hypoxic respiratory failure due to COPD and CHF.  Patient is on chronic oxygen at 2 L/min at home.  He is currently on high flow nasal cannula at 15 L/min on this admission patient is requiring increased oxygen due to CHF and possibly ACS.    4.  Chronic kidney disease stage III /AKI.  Most likely from his acute CHF exacerbation.  Due to his CHF patient will need aggressive diuresis also his AKI will need to be monitored closely.  He did not respond to diuresis challenge and his urine output is low.  I have consulted nephrology  5.  Diabetes mellitus uncontrolled last A1c was 8.8 continue Lantus 60 units at bedtime and sliding scale coverage  6.  Morbid obesity.  As much as this is possible patient on need a reduced calorie diet is to be complicating his hypoxia due to all his comorbidity  7.  Obstructive sleep apnea on CPAP  8.  BPH with LUTS and possible UTI.  Continue Flomax for BPH Code Status: DNR  Severity of Illness: The appropriate patient status for this patient is INPATIENT. Inpatient status is judged to be reasonable and necessary in order to provide the required intensity of service to ensure the patient's safety. The patient's  presenting symptoms, physical exam findings, and initial radiographic and laboratory data in the context of their chronic comorbidities is felt to place them at high risk for further clinical deterioration. Furthermore, it is not anticipated that the patient will be medically stable for discharge from the hospital within 2 midnights of admission. The following factors support the patient status of inpatient.   " The patient's presenting symptoms include shortness of breath dyspnea hypoxia. " The worrisome physical exam findings include shortness of breath dyspnea hypoxia. " The initial radiographic and laboratory data are worrisome because of non-STEMI. " The chronic co-morbidities include coronary artery disease.   * I certify that at the point of admission it is my clinical judgment that the patient will require inpatient hospital care spanning beyond 2 midnights from the point of admission due to high intensity of service, high risk for further deterioration and high frequency of surveillance required.*    Family Communication: None  Disposition Plan: To be determined   Consultants:  Cardiology Dr. Claiborne Billings  Nephrology  Procedures:  None  Antimicrobials:  None  DVT prophylaxis: Heparin IV   Objective: Vitals:   04/02/19 0356 04/02/19 0428 04/02/19 0751 04/02/19 0808  BP:  (!) 120/59  (!) 111/48  Pulse: 80 78  87  Resp: 16 16    Temp:  (!) 97.5 F (36.4 C)    TempSrc:  Oral    SpO2: 95% 99% 97% 92%  Weight:  122.8 kg    Height:        Intake/Output Summary (Last 24 hours) at 04/02/2019 1204 Last data filed at 04/02/2019 0600 Gross per 24 hour  Intake 981 ml  Output 350 ml  Net 631 ml   Filed Weights   03/27/2019 1111 04/01/19 0401 04/02/19 0428  Weight: 114.6 kg 121.6 kg 122.8 kg   Body mass index is 45.06 kg/m.  Exam:  . General: 85 y.o. year-old male well developed well nourished in no acute distress.  Alert and oriented x3.  Morbidly obese not in acute  distress, mild respiratory distress with mildly anxious and requesting something to help calm him down . Cardiovascular: Regular rate and rhythm with no rubs or gallops.  No thyromegaly or JVD noted.   Marland Kitchen Respiratory: Clear to auscultation with no wheezes or rales. Good inspiratory effort.  At rest . Abdomen: Soft, tender right lower quadrant nondistended with normal bowel sounds x4 quadrants. . Musculoskeletal: 2+ lower extremity edema. 2/4 pulses in all 4 extremities. . Skin: No ulcerative lesions noted or rashes, . Psychiatry: Mood is appropriate for condition and setting: Flat affect    Data Reviewed: CBC: Recent Labs  Lab 03/30/2019 0935 04/01/19 0630 04/02/19 0441  WBC 10.5 7.1 10.0  NEUTROABS 8.0*  --  8.1*  HGB 11.7* 11.1* 11.5*  HCT 38.3* 38.6* 37.5*  MCV 93.9 99.2 93.8  PLT 193 159 [id number]   Basic Metabolic Panel: Recent Labs  Lab 04/11/2019 0935 03/17/2019 1120 04/01/19 0630 04/02/19 0441  NA 135 136 138 134*  K 4.8 4.7 5.5* 5.5*  CL 101 100 103 98  CO2 26 26 18* 24  GLUCOSE 226* 231*  182* 128*  BUN 40* 40* 48* 59*  CREATININE 2.08* 2.11* 2.63* 3.46*  CALCIUM 8.6* 8.6* 8.6* 8.6*   GFR: Estimated Creatinine Clearance: 19.7 mL/min (A) (by C-G formula based on SCr of 3.46 mg/dL (H)). Liver Function Tests: Recent Labs  Lab 04/12/2019 0935  AST 26  ALT 29  ALKPHOS 61  BILITOT 0.6  PROT 6.9  ALBUMIN 3.6   Recent Labs  Lab 03/30/2019 0935  LIPASE 28   No results for input(s): AMMONIA in the last 168 hours. Coagulation Profile: No results for input(s): INR, PROTIME in the last 168 hours. Cardiac Enzymes: No results for input(s): CKTOTAL, CKMB, CKMBINDEX, TROPONINI in the last 168 hours. BNP (last 3 results) No results for input(s): PROBNP in the last 8760 hours. HbA1C: Recent Labs    03/30/2019 0935  HGBA1C 7.4*   CBG: Recent Labs  Lab 04/01/19 1221 04/01/19 1638 04/01/19 2112 04/02/19 0605 04/02/19 1110  GLUCAP 144* 140* 137* 113* 129*   Lipid  Profile: No results for input(s): CHOL, HDL, LDLCALC, TRIG, CHOLHDL, LDLDIRECT in the last 72 hours. Thyroid Function Tests: No results for input(s): TSH, T4TOTAL, FREET4, T3FREE, THYROIDAB in the last 72 hours. Anemia Panel: No results for input(s): VITAMINB12, FOLATE, FERRITIN, TIBC, IRON, RETICCTPCT in the last 72 hours. Urine analysis:    Component Value Date/Time   COLORURINE YELLOW 03/15/2019 0934   APPEARANCEUR CLOUDY (A) 04/10/2019 0934   LABSPEC 1.011 03/27/2019 0934   PHURINE 5.0 04/08/2019 0934   GLUCOSEU NEGATIVE 03/17/2019 0934   HGBUR MODERATE (A) 03/22/2019 0934   BILIRUBINUR NEGATIVE 03/20/2019 [place] 04/13/2019 0934   PROTEINUR 100 (A) 04/07/2019 0934   NITRITE NEGATIVE 04/03/2019 0934   LEUKOCYTESUR NEGATIVE 03/28/2019 0934   Sepsis Labs: @LABRCNTIP (procalcitonin:4,lacticidven:4)  ) Recent Results (from the past 240 hour(s))  Urine culture     Status: None   Collection Time: 03/26/2019  9:34 AM   Specimen: Urine, Catheterized  Result Value Ref Range Status   Specimen Description   Final    URINE, CATHETERIZED Performed at [facility], [address], [place], [place] [address]    Special Requests   Final    NONE Performed at [facility], [address] [place], [place] [address]    Culture   Final    NO GROWTH Performed at [facility], [place] [address], [place], [place] [address]    Report Status 04/01/2019 FINAL  Final  SARS CORONAVIRUS 2 (TAT 6-24 HRS) Nasopharyngeal Nasopharyngeal Swab     Status: None   Collection Time: 04/01/2019 11:16 AM   Specimen: Nasopharyngeal Swab  Result Value Ref Range Status   SARS Coronavirus 2 NEGATIVE NEGATIVE Final    Comment: (NOTE) SARS-CoV-2 target nucleic acids are NOT DETECTED. The SARS-CoV-2 RNA is generally detectable in upper and lower respiratory specimens during the acute phase of infection. Negative results do not preclude SARS-CoV-2 infection, do not rule out co-infections with  other pathogens, and should not be used as the sole basis for treatment or other patient management decisions. Negative results must be combined with clinical observations, patient history, and epidemiological information. The expected result is Negative. Fact Sheet for Patients: SugarRoll.be Fact Sheet for Healthcare Providers: https://www.woods-mathews.com/ This test is not yet approved or cleared by the [place] FDA and  has been authorized for detection and/or diagnosis of SARS-CoV-2 by FDA under an Emergency Use Authorization (EUA). This EUA will remain  in effect (meaning this test can be used) for the duration  of the COVID-19 declaration under Section 56 4(b)(1) of the Act, 21 U.S.C. section 360bbb-3(b)(1), unless the authorization is terminated or revoked sooner. Performed at [facility], [place] [address] [place], [place] [address]   Respiratory Panel by RT PCR (Flu A&B, Covid) - Nasopharyngeal Swab     Status: None   Collection Time: 04/11/2019  8:16 PM   Specimen: Nasopharyngeal Swab  Result Value Ref Range Status   SARS Coronavirus 2 by RT PCR NEGATIVE NEGATIVE Final    Comment: (NOTE) SARS-CoV-2 target nucleic acids are NOT DETECTED. The SARS-CoV-2 RNA is generally detectable in upper respiratoy specimens during the acute phase of infection. The lowest concentration of SARS-CoV-2 viral copies this assay can detect is 131 copies/mL. A negative result does not preclude SARS-Cov-2 infection and should not be used as the sole basis for treatment or other patient management decisions. A negative result may occur with  improper specimen collection/handling, submission of specimen other than nasopharyngeal swab, presence of viral mutation(s) within the areas targeted by this assay, and inadequate number of viral copies (<131 copies/mL). A negative result must be combined with clinical observations, patient history, and  epidemiological information. The expected result is Negative. Fact Sheet for Patients:  PinkCheek.be Fact Sheet for Healthcare Providers:  GravelBags.it This test is not yet ap proved or cleared by the [place] FDA and  has been authorized for detection and/or diagnosis of SARS-CoV-2 by FDA under an Emergency Use Authorization (EUA). This EUA will remain  in effect (meaning this test can be used) for the duration of the COVID-19 declaration under Section 564(b)(1) of the Act, 21 U.S.C. section 360bbb-3(b)(1), unless the authorization is terminated or revoked sooner.    Influenza A by PCR NEGATIVE NEGATIVE Final   Influenza B by PCR NEGATIVE NEGATIVE Final    Comment: (NOTE) The Xpert Xpress SARS-CoV-2/FLU/RSV assay is intended as an aid in  the diagnosis of influenza from Nasopharyngeal swab specimens and  should not be used as a sole basis for treatment. Nasal washings and  aspirates are unacceptable for Xpert Xpress SARS-CoV-2/FLU/RSV  testing. Fact Sheet for Patients: PinkCheek.be Fact Sheet for Healthcare Providers: GravelBags.it This test is not yet approved or cleared by the [place] FDA and  has been authorized for detection and/or diagnosis of SARS-CoV-2 by  FDA under an Emergency Use Authorization (EUA). This EUA will remain  in effect (meaning this test can be used) for the duration of the  Covid-19 declaration under Section 564(b)(1) of the Act, 21  U.S.C. section 360bbb-3(b)(1), unless the authorization is  terminated or revoked. Performed at [facility], [address] [place], [place] [address]       Studies: No results found.  Scheduled Meds: . albuterol  6 puff Inhalation Once  . allopurinol  50 mg Oral QODAY  . aspirin EC  81 mg Oral q morning - 10a  . Chlorhexidine Gluconate Cloth  6 each Topical Daily  . citalopram  20 mg Oral  Daily  . clopidogrel  75 mg Oral Daily  . furosemide  40 mg Intravenous Q12H  . insulin aspart  0-5 Units Subcutaneous QHS  . insulin aspart  0-9 Units Subcutaneous TID WC  . insulin aspart  2 Units Subcutaneous TID WC  . insulin glargine  60 Units Subcutaneous QHS  . isosorbide mononitrate  120 mg Oral Daily  . magnesium oxide  400 mg Oral Daily  . metoprolol tartrate  50 mg Oral BID  . pantoprazole  40  mg Oral Daily  . sodium chloride flush  3 mL Intravenous Q12H  . tamsulosin  0.4 mg Oral QPM    Continuous Infusions: . sodium chloride    . heparin 1,250 Units/hr (04/01/19 1530)     LOS: 2 days     Cristal Deer, MD [facility]  To reach me or the doctor on call, go to: [email or web address] Password [facility]  04/02/2019, 12:04 PM

## 2019-04-02 NOTE — Progress Notes (Signed)
Progress Note  Patient Name: [name] Date of Encounter: 04/02/2019  Primary Cardiologist: [name], MD   Subjective   Does not feel well, no appetite.  Inpatient Medications    Scheduled Meds: . albuterol  6 puff Inhalation Once  . allopurinol  50 mg Oral QODAY  . aspirin EC  81 mg Oral q morning - 10a  . Chlorhexidine Gluconate Cloth  6 each Topical Daily  . citalopram  20 mg Oral Daily  . clopidogrel  75 mg Oral Daily  . furosemide  40 mg Intravenous Q12H  . insulin aspart  0-5 Units Subcutaneous QHS  . insulin aspart  0-9 Units Subcutaneous TID WC  . insulin aspart  2 Units Subcutaneous TID WC  . insulin glargine  60 Units Subcutaneous QHS  . isosorbide mononitrate  120 mg Oral Daily  . magnesium oxide  400 mg Oral Daily  . metoprolol tartrate  50 mg Oral BID  . pantoprazole  40 mg Oral Daily  . sodium chloride flush  3 mL Intravenous Q12H  . tamsulosin  0.4 mg Oral QPM   Continuous Infusions: . sodium chloride    . heparin 1,250 Units/hr (04/01/19 1530)   PRN Meds: sodium chloride, acetaminophen **OR** acetaminophen, albuterol, ALPRAZolam, HYDROcodone-acetaminophen, nitroGLYCERIN, ondansetron **OR** ondansetron (ZOFRAN) IV, polyethylene glycol, promethazine, sodium chloride flush, traZODone   Vital Signs    Vitals:   04/02/19 0356 04/02/19 0428 04/02/19 0751 04/02/19 0808  BP:  (!) 120/59  (!) 111/48  Pulse: 80 78  87  Resp: 16 16    Temp:  (!) 97.5 F (36.4 C)    TempSrc:  Oral    SpO2: 95% 99% 97% 92%  Weight:  122.8 kg    Height:        Intake/Output Summary (Last 24 hours) at 04/02/2019 1338 Last data filed at 04/02/2019 0600 Gross per 24 hour  Intake 861 ml  Output 150 ml  Net 711 ml   Last 3 Weights 04/02/2019 04/01/2019 04/13/2019  Weight (lbs) 270 lb 12.8 oz 268 lb 1.6 oz 252 lb 11.2 oz  Weight (kg) 122.834 kg 121.609 kg 114.624 kg      Telemetry    SR - Personally Reviewed  ECG   Possible new st changes in  anterolateral leads concerning for acute ischemia - Personally Reviewed  Physical Exam   GEN:  Uncomfortable Neck: JVD challenging to assess Cardiac: RRR, no murmurs, rubs, or gallops.  Respiratory:  Diminished anteriorly GI: Soft, tender to palpation over suprapubic region, non-distended  MS:  2-3+ edema; No deformity. Neuro:  Nonfocal  Psych: Anxious   Labs    High Sensitivity Troponin:   Recent Labs  Lab 03/26/2019 0935 03/22/2019 1120 03/30/2019 2005 04/03/2019 2248 04/01/19 0630  TROPONINIHS 920* 1,049* 1,519* 1,365* 2,922*      Chemistry Recent Labs  Lab 03/23/2019 0935 04/03/2019 1120 04/01/19 0630 04/02/19 0441  NA 135 136 138 134*  K 4.8 4.7 5.5* 5.5*  CL 101 100 103 98  CO2 26 26 18* 24  GLUCOSE 226* 231* 182* 128*  BUN 40* 40* 48* 59*  CREATININE 2.08* 2.11* 2.63* 3.46*  CALCIUM 8.6* 8.6* 8.6* 8.6*  PROT 6.9  --   --   --   ALBUMIN 3.6  --   --   --   AST 26  --   --   --   ALT 29  --   --   --   ALKPHOS 61  --   --   --  BILITOT 0.6  --   --   --   GFRNONAA 29* 28* 22* 15*  GFRAA 33* 33* 25* 18*  ANIONGAP 8 10 17* 12     Hematology Recent Labs  Lab 03/18/2019 0935 04/01/19 0630 04/02/19 0441  WBC 10.5 7.1 10.0  RBC 4.08* 3.89* 4.00*  HGB 11.7* 11.1* 11.5*  HCT 38.3* 38.6* 37.5*  MCV 93.9 99.2 93.8  MCH 28.7 28.5 28.8  MCHC 30.5 28.8* 30.7  RDW 14.6 14.6 14.7  PLT 193 159 203    BNP Recent Labs  Lab 04/03/2019 0935  BNP 1,086.0*     DDimer No results for input(s): DDIMER in the last 168 hours.   Radiology    ECHOCARDIOGRAM COMPLETE  Result Date: 04/11/2019   ECHOCARDIOGRAM REPORT   Patient Name:   [name] Date of Exam: 04/10/2019 Medical Rec #:  [id number]        Height:       65.0 in Accession #:    [id number]       Weight:       252.0 lb Date of Birth:  22-Dec-1933        BSA:          2.18 m Patient Age:    85 years         BP:           136/77 mmHg Patient Gender: M                HR:           91 bpm. Exam Location:  [facility] Procedure: 2D Echo, Cardiac Doppler and Color Doppler Indications:    Dyspnea 786.09 / R06.00  History:        Patient has prior history of Echocardiogram examinations, most                 recent 07/21/2018. CAD and Previous Myocardial Infarction; Risk                 Factors:Hypertension, Diabetes and Dyslipidemia. Acute on                 chronic diastolic heart failure, Sleep apnea.  Sonographer:    Alvino Chapel RCS Referring Phys: [phone] [facility]  Sonographer Comments: In airborne precautions FINDINGS  Left Ventricle: Left ventricular ejection fraction, by visual estimation, is 30 to 35%. The left ventricle has moderate to severely decreased function. Definity contrast agent was given IV to delineate the left ventricular endocardial borders. The left ventricle demonstrates regional wall motion abnormalities. The left ventricular internal cavity size was the left ventricle is normal in size. There is moderately increased left ventricular hypertrophy. Left ventricular diastolic parameters are consistent with Grade II diastolic dysfunction (pseudonormalization).  LV Wall Scoring: The apical septal segment, mid inferolateral segment, apical inferior segment, and apex are akinetic. The entire anterior wall, basal and mid anterolateral wall, basal and mid anterior septum, basal and mid inferior septum, basal and mid inferior wall, basal inferolateral segment, and apical lateral segment are hypokinetic. Right Ventricle: The right ventricular size is normal. No increase in right ventricular wall thickness. Global RV systolic function is has normal systolic function. The tricuspid regurgitant velocity is 2.80 m/s, and with an assumed right atrial pressure  of 15 mmHg, the estimated right ventricular systolic pressure is moderately elevated at 46.4 mmHg. Left Atrium: Left atrial size was mild-moderately dilated. Right Atrium: Right atrial size was normal in size Pericardium: There is  no evidence of pericardial  effusion. Mitral Valve: The mitral valve is degenerative in appearance. There is mild calcification of the mitral valve leaflet(s). Mild mitral annular calcification. Trivial mitral valve regurgitation. Tricuspid Valve: The tricuspid valve is grossly normal. Tricuspid valve regurgitation is trivial. Aortic Valve: The aortic valve is tricuspid. Aortic valve regurgitation is not visualized. Mild to moderate aortic valve annular calcification. Pulmonic Valve: The pulmonic valve was grossly normal. Pulmonic valve regurgitation is trivial. Pulmonic regurgitation is trivial. Aorta: The aortic root is normal in size and structure. Venous: The inferior vena cava is dilated in size with less than 50% respiratory variability, suggesting right atrial pressure of 15 mmHg. IAS/Shunts: No atrial level shunt detected by color flow Doppler.  LEFT VENTRICLE PLAX 2D LVIDd:         5.01 cm LVIDs:         3.95 cm LV PW:         1.42 cm LV IVS:        1.44 cm LVOT diam:     2.10 cm LV SV:         51 ml LV SV Index:   21.65 LVOT Area:     3.46 cm  LEFT ATRIUM           Index LA diam:      4.50 cm 2.06 cm/m LA Vol (A2C): 91.0 ml 41.70 ml/m   AORTA Ao Root diam: 3.40 cm MV E velocity: 102.00 cm/s  103 cm/s  TRICUSPID VALVE MV A velocity: 7570.00 cm/s 70.3 cm/s TV Peak grad:   313600.0 mmHg MV E/A ratio:  0.01         1.5       TV Vmax:        280.00 m/s                                       TR Peak grad:   31.4 mmHg                                       TR Vmax:        280.00 cm/s                                        SHUNTS                                       Systemic Diam: 2.10 cm  [name] MD Electronically signed by [name] MD Signature Date/Time: 04/11/2019/3:51:02 PM    Final     Cardiac Studies   Echo 04/09/2019- FINDINGS Left Ventricle: Left ventricular ejection fraction, by visual estimation, is 30 to 35%. The left ventricle has moderate to severely decreased function. Definity contrast agent was given  IV to delineate the left ventricular endocardial borders. The left  ventricle demonstrates regional wall motion abnormalities. The left ventricular internal cavity size was the left ventricle is normal in size. There is moderately increased left ventricular hypertrophy. Left ventricular diastolic parameters are  consistent with Grade II diastolic dysfunction (pseudonormalization).  Patient Profile     [name] is a  85 y.o. male with a hx of CAD and combined CHF who is being seen today for the evaluation of acute on chronic CHF and elevated Troponin c/w NSTEMI at the request of Dr [name].  Assessment & Plan    Principal Problem:   Acute on chronic HFrEF (heart failure with reduced ejection fraction) (HCC) Active Problems:   Coronary artery disease involving native coronary artery of native heart with unstable angina pectoris (HCC)   Sleep apnea   Essential hypertension, benign   Obesity, Class III, BMI 40-49.9 (morbid obesity) ([place])   Type 2 diabetes mellitus with stage 3 chronic kidney disease, with long-term current use of insulin (HCC)   CAD (coronary artery disease) of artery bypass graft   Chronic combined systolic and diastolic CHF (congestive heart failure) (HCC)   CKD stage 3 secondary to diabetes (HCC)   Elevated troponin   Acute exacerbation of CHF (congestive heart failure) (HCC)    Acute on chronic combined systolic and diastolic heart failure- -123456 mg IV Lasix was not successful in contributing to urine output as this was minimal yesterday and today.  Recommend nephrology consultation as diuresis will be challenging without their input -Strict ins and outs and daily weights.  NSTEMI -Continue aspirin, Plavix, IV heparin -Concern for ST elevations anterolaterally yesterday, however the patient was unable to be taken off of BiPAP and respiratory status was challenging.  Patient was unable to lie flat.  Patient had a significantly reduced renal clearance yesterday  and for worse today, complicating the pursuit of coronary angiography. -With acute on chronic renal failure and known complex disease in the setting of decompensated heart failure, medical management would be the appropriate strategy at this time.  If he improves from a respiratory standpoint and renal function is better, could consider coronary angiography during this hospitalization. -If needed, nitroglycerin drip for chest pain.  Acute on chronic renal failure- -This may be cardiorenal in nature, he did not respond to Lasix challenge  -Recommend nephrology consultation for discussion of need for coronary angiography and potential for dialysis.  I have communicated this recommendation to the primary service  For questions or updates, please contact [facility] Please consult [email or web address] for contact info under        Signed, Elouise Munroe, MD  04/02/2019, 1:38 PM

## 2019-04-02 NOTE — Progress Notes (Signed)
Patient having trouble getting up mucus-   Paged MD to get a medication for mucus thinner. Patient did not want mucinex.  Also asked if we can remove urinary catheter and get a flutter  Valve.   MD ok to remove.   Placed order for tessalon and flutter valve

## 2019-04-02 NOTE — Progress Notes (Signed)
ANTICOAGULATION CONSULT NOTE - Follow-Up Consult  Pharmacy Consult for heparin Indication: chest pain/ACS   Patient Measurements: Height: 5\' 5"  (165.1 cm) Weight: 270 lb 12.8 oz (122.8 kg) IBW/kg (Calculated) : 61.5 Heparin Dosing Weight: 89 kg  Vital Signs: Temp: 97.5 F (36.4 C) (12/20 0428) Temp Source: Oral (12/20 0428) BP: 111/48 (12/20 0808) Pulse Rate: 87 (12/20 0808)  Labs: Recent Labs    03/15/2019 0935 03/24/2019 1120 03/17/2019 2005 03/29/2019 2248 04/01/19 0630 04/01/19 1559 04/02/19 0441  HGB 11.7*  --   --   --  11.1*  --  11.5*  HCT 38.3*  --   --   --  38.6*  --  37.5*  PLT 193  --   --   --  159  --  203  HEPARINUNFRC  --   --   --   --  0.20* 0.48 0.36  CREATININE 2.08* 2.11*  --   --  2.63*  --  3.46*  TROPONINIHS 920* 1,049* 1,519* 1,365* 2,922*  --   --      Assessment: Pt with chest pain and rising troponin. Cardiology recommends heparin for 48 hours and conservative treatment. Pt has elevated baseline SCr 2-2.3, currently 2.1 with CrCl ~ 30 ml/min. No anticoagulation PTA, just Plavix and aspirin.   Heparin level remains therapeutic at 0.36 on drip rate 1250 units/hr. Hgb stable at 11.5, plts stable and wnl. No overt bleeding noted. Will continue current drip rate for now. Will have received heparin for 48 hours at around 1900 tonight.  Goal of Therapy:  Heparin level 0.3-0.7 units/ml Monitor platelets by anticoagulation protocol: Yes   Plan:  -Continue heparin 1250 units/h -Daily heparin level and CBC -Monitor for signs of bleeding  Richardine Service, PharmD PGY1 Pharmacy Resident Phone: [phone] 04/02/2019  8:39 AM  Please check [email or web address] for unit-specific pharmacy phone numbers.

## 2019-04-02 NOTE — Progress Notes (Signed)
Patient not feeling well.   Held food coverage due to lack of appetite

## 2019-04-03 ENCOUNTER — Encounter (HOSPITAL_COMMUNITY): Payer: Self-pay | Admitting: Family Medicine

## 2019-04-03 DIAGNOSIS — N1831 Chronic kidney disease, stage 3a: Secondary | ICD-10-CM

## 2019-04-03 DIAGNOSIS — G4733 Obstructive sleep apnea (adult) (pediatric): Secondary | ICD-10-CM

## 2019-04-03 DIAGNOSIS — N183 Chronic kidney disease, stage 3 unspecified: Secondary | ICD-10-CM

## 2019-04-03 DIAGNOSIS — Z7189 Other specified counseling: Secondary | ICD-10-CM

## 2019-04-03 DIAGNOSIS — R350 Frequency of micturition: Secondary | ICD-10-CM

## 2019-04-03 DIAGNOSIS — I5043 Acute on chronic combined systolic (congestive) and diastolic (congestive) heart failure: Secondary | ICD-10-CM

## 2019-04-03 DIAGNOSIS — N401 Enlarged prostate with lower urinary tract symptoms: Secondary | ICD-10-CM

## 2019-04-03 DIAGNOSIS — E1165 Type 2 diabetes mellitus with hyperglycemia: Secondary | ICD-10-CM

## 2019-04-03 DIAGNOSIS — I10 Essential (primary) hypertension: Secondary | ICD-10-CM

## 2019-04-03 DIAGNOSIS — J449 Chronic obstructive pulmonary disease, unspecified: Secondary | ICD-10-CM

## 2019-04-03 DIAGNOSIS — J9621 Acute and chronic respiratory failure with hypoxia: Secondary | ICD-10-CM

## 2019-04-03 DIAGNOSIS — E1122 Type 2 diabetes mellitus with diabetic chronic kidney disease: Secondary | ICD-10-CM

## 2019-04-03 DIAGNOSIS — I2119 ST elevation (STEMI) myocardial infarction involving other coronary artery of inferior wall: Secondary | ICD-10-CM

## 2019-04-03 LAB — RENAL FUNCTION PANEL
Albumin: 3 g/dL — ABNORMAL LOW (ref 3.5–5.0)
Anion gap: 9 (ref 5–15)
BUN: 70 mg/dL — ABNORMAL HIGH (ref 8–23)
CO2: 24 mmol/L (ref 22–32)
Calcium: 8.2 mg/dL — ABNORMAL LOW (ref 8.9–10.3)
Chloride: 101 mmol/L (ref 98–111)
Creatinine, Ser: 3.53 mg/dL — ABNORMAL HIGH (ref 0.61–1.24)
GFR calc Af Amer: 17 mL/min — ABNORMAL LOW (ref >60)
GFR calc non Af Amer: 15 mL/min — ABNORMAL LOW (ref >60)
Glucose, Bld: 107 mg/dL — ABNORMAL HIGH (ref 70–99)
Phosphorus: 4.8 mg/dL — ABNORMAL HIGH (ref 2.5–4.6)
Potassium: 4.5 mmol/L (ref 3.5–5.1)
Sodium: 134 mmol/L — ABNORMAL LOW (ref 135–145)

## 2019-04-03 LAB — CBC
HCT: 35.4 % — ABNORMAL LOW (ref 39.0–52.0)
Hemoglobin: 10.8 g/dL — ABNORMAL LOW (ref 13.0–17.0)
MCH: 28.6 pg (ref 26.0–34.0)
MCHC: 30.5 g/dL (ref 30.0–36.0)
MCV: 93.7 fL (ref 80.0–100.0)
Platelets: 165 10*3/uL (ref 150–400)
RBC: 3.78 MIL/uL — ABNORMAL LOW (ref 4.22–5.81)
RDW: 14.6 % (ref 11.5–15.5)
WBC: 8.9 10*3/uL (ref 4.0–10.5)
nRBC: 0 % (ref 0.0–0.2)

## 2019-04-03 LAB — MAGNESIUM: Magnesium: 1.9 mg/dL (ref 1.7–2.4)

## 2019-04-03 LAB — GLUCOSE, CAPILLARY
Glucose-Capillary: 83 mg/dL (ref 70–99)
Glucose-Capillary: 92 mg/dL (ref 70–99)
Glucose-Capillary: 97 mg/dL (ref 70–99)
Glucose-Capillary: 100 mg/dL — ABNORMAL HIGH (ref 70–99)

## 2019-04-03 LAB — HEPARIN LEVEL (UNFRACTIONATED): Heparin Unfractionated: 0.15 IU/mL — ABNORMAL LOW (ref 0.30–0.70)

## 2019-04-03 MED ORDER — INSULIN ASPART 100 UNIT/ML ~~LOC~~ SOLN
6.0000 [IU] | Freq: Three times a day (TID) | SUBCUTANEOUS | Status: DC
  Administered 2019-04-03 – 2019-04-04 (×2): 6 [IU] via SUBCUTANEOUS

## 2019-04-03 MED ORDER — INSULIN GLARGINE 100 UNIT/ML ~~LOC~~ SOLN
45.0000 [IU] | Freq: Every day | SUBCUTANEOUS | Status: DC
  Filled 2019-04-03: qty 0.45

## 2019-04-03 MED ORDER — HEPARIN SODIUM (PORCINE) 5000 UNIT/ML IJ SOLN
5000.0000 [IU] | Freq: Three times a day (TID) | INTRAMUSCULAR | Status: DC
  Administered 2019-04-03 – 2019-04-05 (×7): 5000 [IU] via SUBCUTANEOUS
  Filled 2019-04-03 (×7): qty 1

## 2019-04-03 MED ORDER — DEXTROSE 5 % IV SOLN
500.0000 mg | Freq: Once | INTRAVENOUS | Status: AC
  Administered 2019-04-03: 500 mg via INTRAVENOUS
  Filled 2019-04-03: qty 500

## 2019-04-03 MED ORDER — INSULIN GLARGINE 100 UNIT/ML ~~LOC~~ SOLN
40.0000 [IU] | Freq: Every day | SUBCUTANEOUS | Status: DC
  Administered 2019-04-03: 22:00:00 40 [IU] via SUBCUTANEOUS
  Filled 2019-04-03 (×3): qty 0.4

## 2019-04-03 NOTE — Progress Notes (Signed)
PROGRESS NOTE  [name] [id number] DOB: 1934/03/06   PCP: Rosalee Kaufman, PA-C  Patient is from: Home  DOA: 03/26/2019 LOS: 3  Brief Narrative / Interim history: 85 y.o. yo male  with history of HTN, DM, HLD, obesity, OSA, CAD/CABG in 1994, COPD,  chronic RF on 2 L, [id number], systolic CHF with EF of 30 to 35% [facility] with shortness of breath and admitted with acute CHF exacerbation.  Transferred to [facility] due to worsening EF with new RWMA and elevated troponin for cardiology evaluation.  Course complicated by AKI on CKD.  Nephrology involved as well.  Subjective: No major events overnight of this morning.  Continues to endorse shortness of breath and cough.  Denies chest pain.  Denies GI or UTI symptoms.  Objective: Vitals:   04/03/19 0449 04/03/19 0736 04/03/19 0800 04/03/19 1159  BP: 125/69  (!) 129/59   Pulse: 77 81 85 70  Resp: 18 18  18   Temp: 98.3 F (36.8 C)     TempSrc: Oral     SpO2: 96% 92% 92% 95%  Weight: 123.2 kg     Height:        Intake/Output Summary (Last 24 hours) at 04/03/2019 1220 Last data filed at 04/03/2019 0900 Gross per 24 hour  Intake 647.15 ml  Output 375 ml  Net 272.15 ml   Filed Weights   04/01/19 0401 04/02/19 0428 04/03/19 0449  Weight: 121.6 kg 122.8 kg 123.2 kg    Examination:  GENERAL: No apparent distress.  Nontoxic.  Morbidly obese. HEENT: MMM.  Vision and hearing grossly intact.  NECK: Supple.  Difficult to assess JVD due to body habitus. RESP:  No IWOB. Good air movement bilaterally. CVS:  RRR. Heart sounds normal.  ABD/GI/GU: Bowel sounds present. Soft. Non tender.  MSK/EXT:  No apparent deformity.  2+ pitting edema bilaterally. SKIN: no apparent skin lesion or wound NEURO: Awake, alert and oriented appropriately.  No gross deficit.  PSYCH: Calm. Normal affect.  Procedures:  None  Assessment & Plan: Acute on chronic respiratory failure with hypoxia: Multifactorial including acute CHF,  non-STEMI, underlying COPD, OSA and possible OHS. -He is on BiPAP and 15 L by HFNC.  Patient is DNR/DNI. -Treat treatable causes as below.  Acute on chronic combined CHF: Echo on 12/18 with EF of 30 to 35% (40 to 45% previously), RWMA, G2 DD and RVSP to 46. Had only 675 cc UOP in the last 24 hours on IV Lasix 120 mg 3 times daily.  Remains fluid overloaded. -Nephrology and cardiology managing-nephrology added chlorthalidone 500 mg once. -Continue monitoring fluid status, renal function and electrolytes. -Sodium and fluid restrictions.  Non-STEMI/inferolateral STEMI: High-sensitivity troponin peaked at 3000.  Unfortunately not ideal candidate for cardiac due to AKI -Status post IV heparin for 48 hours. -Continue metoprolol, Imdur, aspirin, Plavix and as needed nitro. -Has a history of statin intolerance.  AKI on CKD-3a/azotemia: creatinine seems to be plateauing now.  Azotemia worse. -Nephrology managing  Hyponatremia: Likely due to renal failure. -Continue monitoring  Uncontrolled diabetes with renal failure:: A1c 7.4%. Recent Labs    04/02/19 2124 04/03/19 0606 04/03/19 1156  GLUCAP 84 83 92  -Continue current insulin regimen  -Has history of statin intolerance.  Chronic COPD: Stable.  Does not seem to be on medication for this at home. -Continue as needed breathing treatments  OSA on CPAP: Currently on BiPAP. -Continue intermittent and nightly BiPAP  Morbid obesity: BMI 45  BPH with LUTS: Urine culture  negative. -Continue Flomax  GERD -Continue Protonix  Goal of care discussion: patient is DNR/DNI.  With acute on chronic comorbidities as above.  Very guarded prognosis.  Discussed this with patient's son, Shonna Chock over the phone.  He understand that his father's health has been in decline.  He is agreeable to palliative care consult -Palliative care consulted           DVT prophylaxis: On subcu heparin Code Status: DNR/DNI Family Communication: Updated patient's  son, Shonna Chock over the phone.  Disposition Plan: Remains inpatient Consultants: Cardiology, nephrology and palliative care   Microbiology summarized: [id number] negative. Urine culture negative. MRSA PCR positive.  Sch Meds:  Scheduled Meds: . AeroChamber Plus Flo-Vu Large  1 each Other Once  . albuterol  6 puff Inhalation Once  . allopurinol  50 mg Oral QODAY  . aspirin EC  81 mg Oral q morning - 10a  . Chlorhexidine Gluconate Cloth  6 each Topical Daily  . Chlorhexidine Gluconate Cloth  6 each Topical Q0600  . citalopram  20 mg Oral Daily  . clopidogrel  75 mg Oral Daily  . heparin injection (subcutaneous)  5,000 Units Subcutaneous Q8H  . insulin aspart  0-5 Units Subcutaneous QHS  . insulin aspart  0-9 Units Subcutaneous TID WC  . insulin aspart  2 Units Subcutaneous TID WC  . insulin glargine  60 Units Subcutaneous QHS  . isosorbide mononitrate  120 mg Oral Daily  . magnesium oxide  400 mg Oral Daily  . metoprolol tartrate  50 mg Oral BID  . mupirocin ointment  1 application Nasal BID  . pantoprazole  40 mg Oral Daily  . sodium chloride flush  3 mL Intravenous Q12H  . tamsulosin  0.4 mg Oral QPM   Continuous Infusions: . sodium chloride    . chlorothiazide (DIURIL) IV    . furosemide 120 mg (04/03/19 0603)   PRN Meds:.sodium chloride, acetaminophen **OR** acetaminophen, albuterol, ALPRAZolam, benzonatate, HYDROcodone-acetaminophen, nitroGLYCERIN, ondansetron **OR** ondansetron (ZOFRAN) IV, polyethylene glycol, promethazine, sodium chloride flush, traZODone  Antimicrobials: Anti-infectives (From admission, onward)   Start     Dose/Rate Route Frequency Ordered Stop   04/07/2019 2000  cefTRIAXone (ROCEPHIN) 1 g in sodium chloride 0.9 % 100 mL IVPB  Status:  Discontinued     1 g 200 mL/hr over 30 Minutes Intravenous Every 24 hours 03/21/2019 1910 04/02/19 0959       I have personally reviewed the following labs and images: CBC: Recent Labs  Lab 03/15/2019 0935  04/01/19 0630 04/02/19 0441 04/03/19 0525  WBC 10.5 7.1 10.0 8.9  NEUTROABS 8.0*  --  8.1*  --   HGB 11.7* 11.1* 11.5* 10.8*  HCT 38.3* 38.6* 37.5* 35.4*  MCV 93.9 99.2 93.8 93.7  PLT 193 159 203 165   BMP &GFR Recent Labs  Lab 03/22/2019 0935 03/17/2019 1120 04/01/19 0630 04/02/19 0441 04/03/19 0820  NA 135 136 138 134* 134*  K 4.8 4.7 5.5* 5.5* 4.5  CL 101 100 103 98 101  CO2 26 26 18* 24 24  GLUCOSE 226* 231* 182* 128* 107*  BUN 40* 40* 48* 59* 70*  CREATININE 2.08* 2.11* 2.63* 3.46* 3.53*  CALCIUM 8.6* 8.6* 8.6* 8.6* 8.2*  MG  --   --   --   --  1.9  PHOS  --   --   --   --  4.8*   Estimated Creatinine Clearance: 19.3 mL/min (A) (by C-G formula based on SCr of 3.53 mg/dL (H)). Liver & Pancreas:  Recent Labs  Lab 04/09/2019 0935 04/03/19 0820  AST 26  --   ALT 29  --   ALKPHOS 61  --   BILITOT 0.6  --   PROT 6.9  --   ALBUMIN 3.6 3.0*   Recent Labs  Lab 03/23/2019 0935  LIPASE 28   No results for input(s): AMMONIA in the last 168 hours. Diabetic: No results for input(s): HGBA1C in the last 72 hours. Recent Labs  Lab 04/02/19 1110 04/02/19 1623 04/02/19 2124 04/03/19 0606 04/03/19 1156  GLUCAP 129* 92 84 83 92   Cardiac Enzymes: No results for input(s): CKTOTAL, CKMB, CKMBINDEX, TROPONINI in the last 168 hours. No results for input(s): PROBNP in the last 8760 hours. Coagulation Profile: No results for input(s): INR, PROTIME in the last 168 hours. Thyroid Function Tests: No results for input(s): TSH, T4TOTAL, FREET4, T3FREE, THYROIDAB in the last 72 hours. Lipid Profile: No results for input(s): CHOL, HDL, LDLCALC, TRIG, CHOLHDL, LDLDIRECT in the last 72 hours. Anemia Panel: No results for input(s): VITAMINB12, FOLATE, FERRITIN, TIBC, IRON, RETICCTPCT in the last 72 hours. Urine analysis:    Component Value Date/Time   COLORURINE YELLOW 04/02/2019 1701   APPEARANCEUR CLOUDY (A) 04/02/2019 1701   LABSPEC 1.013 04/02/2019 1701   PHURINE 5.0  04/02/2019 1701   GLUCOSEU NEGATIVE 04/02/2019 1701   HGBUR LARGE (A) 04/02/2019 1701   BILIRUBINUR NEGATIVE 04/02/2019 1701   KETONESUR NEGATIVE 04/02/2019 1701   PROTEINUR 30 (A) 04/02/2019 1701   NITRITE NEGATIVE 04/02/2019 1701   LEUKOCYTESUR MODERATE (A) 04/02/2019 1701   Sepsis Labs: Invalid input(s): PROCALCITONIN, [place]  Microbiology: Recent Results (from the past 240 hour(s))  Urine culture     Status: None   Collection Time: 03/21/2019  9:34 AM   Specimen: Urine, Catheterized  Result Value Ref Range Status   Specimen Description   Final    URINE, CATHETERIZED Performed at [facility], [address] [place], [place] [address]    Special Requests   Final    NONE Performed at [facility], [address], [place], [place] [address]    Culture   Final    NO GROWTH Performed at [facility], [place] [address] [place], [place] [address]    Report Status 04/01/2019 FINAL  Final  SARS CORONAVIRUS 2 (TAT 6-24 HRS) Nasopharyngeal Nasopharyngeal Swab     Status: None   Collection Time: 04/03/2019 11:16 AM   Specimen: Nasopharyngeal Swab  Result Value Ref Range Status   SARS Coronavirus 2 NEGATIVE NEGATIVE Final    Comment: (NOTE) SARS-CoV-2 target nucleic acids are NOT DETECTED. The SARS-CoV-2 RNA is generally detectable in upper and lower respiratory specimens during the acute phase of infection. Negative results do not preclude SARS-CoV-2 infection, do not rule out co-infections with other pathogens, and should not be used as the sole basis for treatment or other patient management decisions. Negative results must be combined with clinical observations, patient history, and epidemiological information. The expected result is Negative. Fact Sheet for Patients: SugarRoll.be Fact Sheet for Healthcare Providers: https://www.woods-mathews.com/ This test is not yet approved or cleared by the [place] FDA and  has been  authorized for detection and/or diagnosis of SARS-CoV-2 by FDA under an Emergency Use Authorization (EUA). This EUA will remain  in effect (meaning this test can be used) for the duration of the COVID-19 declaration under Section 56 4(b)(1) of the Act, 21 U.S.C. section 360bbb-3(b)(1), unless the authorization is terminated or revoked sooner. Performed at [facility], [place] [address] [place], [place] [address]   Respiratory Panel by RT PCR (Flu A&B, Covid) - Nasopharyngeal Swab     Status: None   Collection Time: 04/08/2019  8:16 PM   Specimen: Nasopharyngeal Swab  Result Value Ref Range Status   SARS Coronavirus 2 by RT PCR NEGATIVE NEGATIVE Final    Comment: (NOTE) SARS-CoV-2 target nucleic acids are NOT DETECTED. The SARS-CoV-2 RNA is generally detectable in upper respiratoy specimens during the acute phase of infection. The lowest concentration of SARS-CoV-2 viral copies this assay can detect is 131 copies/mL. A negative result does not preclude SARS-Cov-2 infection and should not be used as the sole basis for treatment or other patient management decisions. A negative result may occur with  improper specimen collection/handling, submission of specimen other than nasopharyngeal swab, presence of viral mutation(s) within the areas targeted by this assay, and inadequate number of viral copies (<131 copies/mL). A negative result must be combined with clinical observations, patient history, and epidemiological information. The expected result is Negative. Fact Sheet for Patients:  PinkCheek.be Fact Sheet for Healthcare Providers:  GravelBags.it This test is not yet ap proved or cleared by the [place] FDA and  has been authorized for detection and/or diagnosis of SARS-CoV-2 by FDA under an Emergency Use Authorization (EUA). This EUA will remain  in effect (meaning this test can be used) for the duration of  the COVID-19 declaration under Section 564(b)(1) of the Act, 21 U.S.C. section 360bbb-3(b)(1), unless the authorization is terminated or revoked sooner.    Influenza A by PCR NEGATIVE NEGATIVE Final   Influenza B by PCR NEGATIVE NEGATIVE Final    Comment: (NOTE) The Xpert Xpress SARS-CoV-2/FLU/RSV assay is intended as an aid in  the diagnosis of influenza from Nasopharyngeal swab specimens and  should not be used as a sole basis for treatment. Nasal washings and  aspirates are unacceptable for Xpert Xpress SARS-CoV-2/FLU/RSV  testing. Fact Sheet for Patients: PinkCheek.be Fact Sheet for Healthcare Providers: GravelBags.it This test is not yet approved or cleared by the [place] FDA and  has been authorized for detection and/or diagnosis of SARS-CoV-2 by  FDA under an Emergency Use Authorization (EUA). This EUA will remain  in effect (meaning this test can be used) for the duration of the  Covid-19 declaration under Section 564(b)(1) of the Act, 21  U.S.C. section 360bbb-3(b)(1), unless the authorization is  terminated or revoked. Performed at [facility], [address], [place], [place] [address]   MRSA PCR Screening     Status: Abnormal   Collection Time: 04/02/19  8:54 PM   Specimen: Nasopharyngeal  Result Value Ref Range Status   MRSA by PCR POSITIVE (A) NEGATIVE Final    Comment: CRITICAL RESULT CALLED TO, READ BACK BY AND VERIFIED WITH: RN, Arvella Nigh [id number] @2322  [facility] Performed at [facility], [address], [place], [place] [address]     Radiology Studies: US RENAL  Result Date: 04/02/2019 CLINICAL DATA:  Acute renal insufficiency EXAM: RENAL / URINARY TRACT ULTRASOUND COMPLETE COMPARISON:  None. FINDINGS: Right Kidney: Renal measurements: 10.2 x 4.6 x 6.7 cm = volume: 164.1 mL. Nonobstructive 9 mm stone. Left Kidney: Renal measurements: 14.8 x 7.0 x 6.8 cm = volume: 369.6 mL. A small amount of  perinephric fluid is seen. Bladder: Not visualized. Other: Right pleural effusion. IMPRESSION: 1. No hydronephrosis. Nonobstructive 9 mm stone in the right kidney. 2. A small amount of left perinephric fluid is seen, nonspecific. 3.  Right pleural effusion. Electronically Signed   By: Dorise Bullion III M.D   On: 04/02/2019 17:23    50 minutes with more than 50% spent in reviewing records, counseling patient/family and coordinating care.   Eissa Buchberger T. Fajardo  If 7PM-7AM, please contact night-coverage [email or web address] Password [email or web address] 04/03/2019, 12:20 PM

## 2019-04-03 NOTE — TOC Initial Note (Signed)
Transition of Care [facility]) - Initial/Assessment Note    Patient Details  Name: [name] MRN: [id number] Date of Birth: May 17, 1933  Transition of Care [facility]) CM/SW Contact:    Gelene Mink, [place] Phone Number: 04/03/2019, 4:59 PM  Clinical Narrative:                  Patient being recommended for SNF. CSW called the patient's wife and spoke with her. CSW introduced herself and explained her role. CSW shared therapy recommendation. She stated she is agreeable to SNF for her husband. She stated she is not capable of taking care of him at home. The patient has been to [facility] in the past. Patient's wife is agreeable to him returning to [facility]. CSW obtained permission to fax the patient out to other facilities near their home.   CSW will provide bed offers. Patient will also need insurance authorization. CSW will continue to follow and assist with disposition planning.   Expected Discharge Plan: Skilled Nursing Facility Barriers to Discharge: Continued Medical Work up, Ship broker   Patient Goals and CMS Choice Patient states their goals for this hospitalization and ongoing recovery are:: Pt family is agreeable to SNF CMS Medicare.gov Compare Post Acute Care list provided to:: Patient Represenative (must comment) Choice offered to / list presented to : Spouse  Expected Discharge Plan and Services Expected Discharge Plan: [place] In-house Referral: Clinical Social Work Discharge Planning Services: NA Post Acute Care Choice: [place] Living arrangements for the past 2 months: Single Family Home                 DME Arranged: N/A         HH Arranged: NA [place] Agency: NA        Prior Living Arrangements/Services Living arrangements for the past 2 months: Single Family Home Lives with:: Spouse Patient language and need for interpreter reviewed:: No Do you feel safe going back to the place where you live?: Yes      Need for  Family Participation in Patient Care: No (Comment) Care giver support system in place?: Yes (comment)   Criminal Activity/Legal Involvement Pertinent to Current Situation/Hospitalization: No - Comment as needed  Activities of Daily Living Home Assistive Devices/Equipment: CPAP ADL Screening (condition at time of admission) Patient's cognitive ability adequate to safely complete daily activities?: Yes Is the patient deaf or have difficulty hearing?: No Does the patient have difficulty seeing, even when wearing glasses/contacts?: No Does the patient have difficulty concentrating, remembering, or making decisions?: No Patient able to express need for assistance with ADLs?: Yes Does the patient have difficulty dressing or bathing?: No Independently performs ADLs?: Yes (appropriate for developmental age) Does the patient have difficulty walking or climbing stairs?: Yes Weakness of Legs: Both Weakness of Arms/Hands: None  Permission Sought/Granted Permission sought to share information with : Case Manager Permission granted to share information with : Yes, Verbal Permission Granted     Permission granted to share info w AGENCY: All SNF        Emotional Assessment Appearance:: Appears stated age Attitude/Demeanor/Rapport: Unable to Assess Affect (typically observed): Unable to Assess Orientation: : Oriented to Self, Oriented to Place, Oriented to  Time, Oriented to Situation Alcohol / Substance Use: Not Applicable Psych Involvement: No (comment)  Admission diagnosis:  Acute exacerbation of CHF (congestive heart failure) (HCC) [I50.9] Acute respiratory failure with hypoxia (HCC) [J96.01] Acute on chronic congestive heart failure, unspecified heart failure type ([place]) [I50.9] Patient Active  Problem List   Diagnosis Date Noted  . Acute exacerbation of CHF (congestive heart failure) ([place]) 04/03/2019  . Acute on chronic HFrEF (heart failure with reduced ejection fraction) ([place]) 03/21/2019   . Dehydration 12/18/2018  . Acute metabolic encephalopathy 123456  . Acute renal failure superimposed on stage 3 chronic kidney disease ([place]) 12/18/2018  . AKI (acute kidney injury) ([place])   . Bilateral lower extremity edema   . HCAP (healthcare-associated pneumonia) 10/22/2018  . Pressure injury of skin 10/22/2018  . E. coli UTI 08/04/2018  . CKD stage 3 due to type 2 diabetes mellitus ([place]) 07/27/2018  . Chronic constipation 07/27/2018  . COPD (chronic obstructive pulmonary disease) ([place]) 07/27/2018  . BPH without urinary obstruction 07/27/2018  . Chronic gout due to renal impairment without tophus 07/27/2018  . Depression with anxiety 07/27/2018  . Mild aortic stenosis 07/27/2018  . Unstable angina ([place]) 07/22/2018  . Demand ischemia ([place])   . Secondary cardiomyopathy ([place])   . Chest pain 07/21/2018  . Elevated troponin 07/21/2018  . Acute on chronic diastolic heart failure ([place])   . CKD stage 3 secondary to diabetes ([place]) 01/01/2014  . Chronic combined systolic and diastolic CHF (congestive heart failure) ([place]) 08/11/2012  . CAD (coronary artery disease) of artery bypass graft 08/08/2012  . Sleep apnea   . Essential hypertension, benign   . Obesity, Class III, BMI 40-49.9 (morbid obesity) ([place])   . Hyperlipidemia   . Type 2 diabetes mellitus with stage 3 chronic kidney disease, with long-term current use of insulin ([place])   . Coronary artery disease involving native coronary artery of native heart with unstable angina pectoris ([place])    PCP:  Rosalee Kaufman, PA-C Pharmacy:   [facility] [phone] - [place], [place] [address] [place] [address] Phone: [phone] Fax: [phone]  [facility] [phone] - [place], [place] [address] [place] [address] Phone: [phone] Fax: [phone]  [facility] - [address] [place],  [place] [address] Phone: [phone] Fax: [phone]     Social Determinants of Health (SDOH) Interventions    Readmission Risk Interventions Readmission Risk Prevention Plan 12/24/2018 12/21/2018 12/20/2018  Transportation Screening Complete - Complete  PCP or Specialist Appt within 3-5 Days - - -  [place] or [place] for [place] - - -  Medication Review (RN Care Manager) Complete - Complete  PCP or Specialist appointment within 3-5 days of discharge Complete - Not Complete  PCP/Specialist Appt Not Complete comments - Patient will be going to SNF and will be seen by facility medical director. -  [place] or Home Care Consult Complete - Complete  SW Recovery Care/Counseling Consult Complete - Complete  Palliative Care Screening Not Applicable - Not Complete  Skilled Nursing Facility Complete - Complete  Some recent data might be hidden

## 2019-04-03 NOTE — Consult Note (Signed)
   [facility] CM Inpatient Consult   04/03/2019  RYER KERWICK Sep 19, 1933 [id number]     Patient screened for 41% extreme high risk score of unplanned readmission and 3 hospitalizations in the past 6 months with [employer] plan.   Review of medical record reveals as:  Patient presented to [facility] with shortness of breath and admitted with acute congestive HF exacerbation.  Transferred to [facility] due to worsening EF and elevated troponin for cardiology evaluation. Course complicated by AKI on CKD.    Patient is from home but PT/OT recommending discharge to skilled nursing facility.   Noted that patient is currently listed as an Engaged [facility] patient. He will be followed by [facility] in the community, with full case management services.    [facility] care management services are not appropriate at this time.    Will sign off.    For questions, please contact:  Edwena Felty A. Jadis Mika, BSN, RN-BC [facility] Liaison Cell: [phone]

## 2019-04-03 NOTE — Evaluation (Signed)
Physical Therapy Evaluation Patient Details Name: [name] MRN: [id number] DOB: September 27, 1933 Today's Date: 04/03/2019   History of Present Illness  Pt is a 85 y.o. yo male  with history of HTN, DM, HLD, obesity, sleep apnea, CAD status post CABG, COPD, CKD, chronic respiratory failure on 2 L,systolic CHF with EF of 30 to 35% admitted with acute CHF exacerbation, NSTEMI, and AKI with rising creatine.  Pt does have pallative consult pending.  Clinical Impression  Pt admitted with above diagnosis. Pt was mod-max A to get to EOB and partial sit to stand.  He declined attempting to fully stand or get to chair.  Pt with poor prognosis medically and with limited motivation for therapy.  However, he is significantly below his baseline and will benefit from therapy as able to progress.  Pt currently with functional limitations due to the deficits listed below (see PT Problem List). Pt will benefit from skilled PT to increase their independence and safety with mobility to allow discharge to the venue listed below.       Follow Up Recommendations SNF    Equipment Recommendations  Other (comment)(to be determiend next venue)    Recommendations for Other Services       Precautions / Restrictions Precautions Precautions: Fall Precaution Comments: monitor O2      Mobility  Bed Mobility Overal bed mobility: Needs Assistance Bed Mobility: Supine to Sit;Sit to Supine     Supine to sit: Mod assist;HOB elevated Sit to supine: Mod assist;HOB elevated   General bed mobility comments: cues for technique;  assist with legs to EOB and back into bed; assist to elevate trunk  Transfers Overall transfer level: Needs assistance   Transfers: Lateral/Scoot Transfers;Sit to/from Stand Sit to Stand: Max assist        Lateral/Scoot Transfers: Max assist General transfer comment: Pt completed partial sit to stand with max A;  he declined full sit to stand or transfer to chair.  Pt did scoot toward  HOB with Max A and rest breaks.  Ambulation/Gait             General Gait Details: deferred  Stairs            Wheelchair Mobility    Modified Rankin (Stroke Patients Only)       Balance Overall balance assessment: Needs assistance Sitting-balance support: No upper extremity supported;Feet supported Sitting balance-Leahy Scale: Good Sitting balance - Comments: Sat EOB for 15 minutes (for rest breaks , up right activity, exercises, and waiting on assistance)     Standing balance-Leahy Scale: Zero                               Pertinent Vitals/Pain Pain Assessment: No/denies pain    Home Living Family/patient expects to be discharged to:: Private residence Living Arrangements: Spouse/significant other Available Help at Discharge: Family;Available 24 hours/day Type of Home: House Home Access: Level entry     Home Layout: One level Home Equipment: Walker - 2 wheels      Prior Function Level of Independence: Independent with assistive device(s)               Hand Dominance   Dominant Hand: Left    Extremity/Trunk Assessment   Upper Extremity Assessment Upper Extremity Assessment: RUE deficits/detail;LUE deficits/detail RUE Deficits / Details: ROM WFL; Strength grossly 4/5 LUE Deficits / Details: ROM WFL; Strength grossly 4/5    Lower Extremity Assessment Lower Extremity  Assessment: RLE deficits/detail;LLE deficits/detail RLE Deficits / Details: ROM WFL; Strength grossly 4/5; pitting edema LLE Deficits / Details: ROM WFL; Strength grossly 4/5; pitting edema    Cervical / Trunk Assessment Cervical / Trunk Assessment: Normal  Communication   Communication: No difficulties  Cognition   Behavior During Therapy: Flat affect Overall Cognitive Status: Within Functional Limits for tasks assessed                                        General Comments General comments (skin integrity, edema, etc.): on 15 LPM high  flow O2 with sats 91%; HR 78-82 bpm    Exercises General Exercises - Lower Extremity Ankle Circles/Pumps: AROM;Both;15 reps;Seated Long Arc Quad: AROM;10 reps;Both;Seated   Assessment/Plan    PT Assessment Patient needs continued PT services  PT Problem List Decreased strength;Decreased mobility;Decreased range of motion;Decreased activity tolerance;Decreased balance;Cardiopulmonary status limiting activity;Decreased knowledge of use of DME       PT Treatment Interventions DME instruction;Therapeutic activities;Cognitive remediation;Therapeutic exercise;Gait training;Patient/family education;Functional mobility training;Balance training    PT Goals (Current goals can be found in the Care Plan section)  Acute Rehab PT Goals Patient Stated Goal: "I don't want rehab, I want to go home and die" PT Goal Formulation: With patient/family Time For Goal Achievement: 04/18/19 Potential to Achieve Goals: Fair    Frequency Min 2X/week   Barriers to discharge Decreased caregiver support family unable to provide physical assist    Co-evaluation               AM-PAC PT "6 Clicks" Mobility  Outcome Measure Help needed turning from your back to your side while in a flat bed without using bedrails?: A Lot Help needed moving from lying on your back to sitting on the side of a flat bed without using bedrails?: A Lot Help needed moving to and from a bed to a chair (including a wheelchair)?: Total Help needed standing up from a chair using your arms (e.g., wheelchair or bedside chair)?: Total Help needed to walk in hospital room?: Total Help needed climbing 3-5 steps with a railing? : Total 6 Click Score: 8    End of Session Equipment Utilized During Treatment: Gait belt;Oxygen   Patient left: in bed;with bed alarm set;with family/visitor present;with call bell/phone within reach Nurse Communication: Mobility status;Need for lift equipment(on whiteboard) PT Visit Diagnosis: Muscle  weakness (generalized) (M62.81);Unsteadiness on feet (R26.81)    Time: 1250-1320 PT Time Calculation (min) (ACUTE ONLY): 30 min   Charges:   PT Evaluation $PT Eval Moderate Complexity: 1 Mod PT Treatments $Therapeutic Exercise: 8-22 mins        Maggie Font, PT Acute Rehab Services Pager [phone] [facility] [phone] [facility] [phone]   Karlton Lemon 04/03/2019, 1:58 PM

## 2019-04-03 NOTE — Progress Notes (Signed)
ANTICOAGULATION CONSULT NOTE - Follow-Up Consult  Pharmacy Consult for Heparin Indication: chest pain/ACS   Patient Measurements: Height: 5\' 5"  (165.1 cm) Weight: 271 lb 9.7 oz (123.2 kg) IBW/kg (Calculated) : 61.5 Heparin Dosing Weight: 89 kg  Vital Signs: Temp: 98.3 F (36.8 C) (12/21 0449) Temp Source: Oral (12/21 0449) BP: 125/69 (12/21 0449) Pulse Rate: 77 (12/21 0449)  Labs: Recent Labs    04/07/2019 0935 03/29/2019 1120 03/14/2019 2005 04/07/2019 2248 04/01/19 0630 04/01/19 1559 04/02/19 0441 04/03/19 0525  HGB  --   --   --   --  11.1*  --  11.5* 10.8*  HCT  --   --   --   --  38.6*  --  37.5* 35.4*  PLT  --   --   --   --  159  --  203 165  HEPARINUNFRC   < >  --   --   --  0.20* 0.48 0.36 0.15*  CREATININE  --  2.11*  --   --  2.63*  --  3.46*  --   TROPONINIHS  --  1,049* 1,519* 1,365* 2,922*  --   --   --    < > = values in this interval not displayed.     Assessment: Pt with chest pain and rising troponin. Cardiology recommends heparin for 48 hours and conservative treatment. Pt has elevated baseline SCr 2-2.3, currently 2.1 with CrCl ~ 30 ml/min. No anticoagulation PTA, just Plavix and aspirin.   Heparin level remains therapeutic at 0.36 on drip rate 1250 units/hr. Hgb stable at 11.5, plts stable and wnl. No overt bleeding noted. Will continue current drip rate for now. Will have received heparin for 48 hours at around 1900 tonight.  12/21 AM update: Heparin level low  No issues per RN  Goal of Therapy:  Heparin level 0.3-0.7 units/ml Monitor platelets by anticoagulation protocol: Yes   Plan:  -Inc heparin to 1350 units/hr -Re-check heparin level in 8 hours  Narda Bonds, PharmD, [facility] Pharmacist Phone: [phone]

## 2019-04-03 NOTE — Progress Notes (Addendum)
[facility] NEPHROLOGY PROGRESS NOTE  Assessment/ Plan: Pt is a 85 y.o. yo male  with history of HTN, DM, HLD, obesity, sleep apnea, CAD status post CABG, COPD, CKD, chronic respiratory failure on 2 L,systolic CHF with EF of 30 to 35% admitted with acute CHF exacerbation seen as a consultation  for AKI on CKD and fluid overload.  #AKI on CKD, nonoliguric likely cardiorenal syndrome with reduced effective renal perfusion.  UA was cloudy some protein and RBC unknown if it is traumatic.  Recently the urine was normal.  Doubt it is GN. Marginal urine output with higher dose of IV Lasix.  He is grossly volume overloaded.  I will add a dose of chlorothiazide 500 mg to augment diuresis in addition to Lasix 120 mg every 8 hour.   Bladder scan with 0 mL, strict ins and out, US renal no hydronephrosis. Avoid IV contrast Hopefully the kidney function will improve with diuresis.  I do not think he is a candidate for long-term outpatient dialysis. No indication for HD currently.  Noted he is DNR.    #Hyperkalemia: In the setting of AKI.  Diuretics will help.  Potassium level improved.  #Acute CHF exacerbation/and NSTEMI: IV heparin and cardiology is following.  LVEF 30 to 35%.  #Acute on chronic hypoxic respiratory failure with pulmonary edema: Diuresis as above.  Subjective: Seen and examined at bedside.  Urine output only 675 cc with IV Lasix.  Still requiring oxygen.  No  new event. Objective Vital signs in last 24 hours: Vitals:   04/03/19 0338 04/03/19 0449 04/03/19 0736 04/03/19 0800  BP:  125/69  (!) 129/59  Pulse: 76 77 81 85  Resp: 16 18 18    Temp:  98.3 F (36.8 C)    TempSrc:  Oral    SpO2: 94% 96% 92% 92%  Weight:  123.2 kg    Height:       Weight change: 0.366 kg  Intake/Output Summary (Last 24 hours) at 04/03/2019 0957 Last data filed at 04/03/2019 0701 Gross per 24 hour  Intake 587.15 ml  Output 375 ml  Net 212.15 ml       Labs: Basic Metabolic  Panel: Recent Labs  Lab 04/01/19 0630 04/02/19 0441 04/03/19 0820  NA 138 134* 134*  K 5.5* 5.5* 4.5  CL 103 98 101  CO2 18* 24 24  GLUCOSE 182* 128* 107*  BUN 48* 59* 70*  CREATININE 2.63* 3.46* 3.53*  CALCIUM 8.6* 8.6* 8.2*  PHOS  --   --  4.8*   Liver Function Tests: Recent Labs  Lab 03/14/2019 0935 04/03/19 0820  AST 26  --   ALT 29  --   ALKPHOS 61  --   BILITOT 0.6  --   PROT 6.9  --   ALBUMIN 3.6 3.0*   Recent Labs  Lab 03/16/2019 0935  LIPASE 28   No results for input(s): AMMONIA in the last 168 hours. CBC: Recent Labs  Lab 03/20/2019 0935 04/01/19 0630 04/02/19 0441 04/03/19 0525  WBC 10.5 7.1 10.0 8.9  NEUTROABS 8.0*  --  8.1*  --   HGB 11.7* 11.1* 11.5* 10.8*  HCT 38.3* 38.6* 37.5* 35.4*  MCV 93.9 99.2 93.8 93.7  PLT 193 159 203 165   Cardiac Enzymes: No results for input(s): CKTOTAL, CKMB, CKMBINDEX, TROPONINI in the last 168 hours. CBG: Recent Labs  Lab 04/02/19 0605 04/02/19 1110 04/02/19 1623 04/02/19 2124 04/03/19 0606  GLUCAP 113* 129* 92 84 83    Iron  Studies: No results for input(s): IRON, TIBC, TRANSFERRIN, FERRITIN in the last 72 hours. Studies/Results: US RENAL  Result Date: 04/02/2019 CLINICAL DATA:  Acute renal insufficiency EXAM: RENAL / URINARY TRACT ULTRASOUND COMPLETE COMPARISON:  None. FINDINGS: Right Kidney: Renal measurements: 10.2 x 4.6 x 6.7 cm = volume: 164.1 mL. Nonobstructive 9 mm stone. Left Kidney: Renal measurements: 14.8 x 7.0 x 6.8 cm = volume: 369.6 mL. A small amount of perinephric fluid is seen. Bladder: Not visualized. Other: Right pleural effusion. IMPRESSION: 1. No hydronephrosis. Nonobstructive 9 mm stone in the right kidney. 2. A small amount of left perinephric fluid is seen, nonspecific. 3. Right pleural effusion. Electronically Signed   By: Dorise Bullion III M.D   On: 04/02/2019 17:23    Medications: Infusions: . sodium chloride    . chlorothiazide (DIURIL) IV    . furosemide 120 mg (04/03/19  0603)  . heparin 1,350 Units/hr (04/03/19 0701)    Scheduled Medications: . AeroChamber Plus Flo-Vu Large  1 each Other Once  . albuterol  6 puff Inhalation Once  . allopurinol  50 mg Oral QODAY  . aspirin EC  81 mg Oral q morning - 10a  . Chlorhexidine Gluconate Cloth  6 each Topical Daily  . Chlorhexidine Gluconate Cloth  6 each Topical Q0600  . citalopram  20 mg Oral Daily  . clopidogrel  75 mg Oral Daily  . insulin aspart  0-5 Units Subcutaneous QHS  . insulin aspart  0-9 Units Subcutaneous TID WC  . insulin aspart  2 Units Subcutaneous TID WC  . insulin glargine  60 Units Subcutaneous QHS  . isosorbide mononitrate  120 mg Oral Daily  . magnesium oxide  400 mg Oral Daily  . metoprolol tartrate  50 mg Oral BID  . mupirocin ointment  1 application Nasal BID  . pantoprazole  40 mg Oral Daily  . sodium chloride flush  3 mL Intravenous Q12H  . tamsulosin  0.4 mg Oral QPM    have reviewed scheduled and prn medications.  Physical Exam: General: Ill-looking male, lying on bed Heart:RRR, s1s2 nl, no rub Lungs: Bibasal crackle, no increased work of breathing Abdomen:soft, Non-tender, -distended Extremities: Lateral LE edema Neurology: Alert, awake and following commands.  No asterixis.  Beyla Loney Prasad Lakresha Stifter 04/03/2019,9:57 AM  LOS: 3 days  Pager: [phone]

## 2019-04-03 NOTE — Progress Notes (Signed)
RN placed patient on BIPAP. Pt tolerating at this time

## 2019-04-03 NOTE — Progress Notes (Signed)
Notified [facility] about patient CBG of 100 and 45 units of lantus scheduled.  Order changed to 40 units.  40 units of lantus given with snack.

## 2019-04-03 NOTE — Evaluation (Signed)
Occupational Therapy Evaluation Patient Details Name: [name] MRN: [id number] DOB: 1933/11/07 Today's Date: 04/03/2019    History of Present Illness Pt is a 85 y.o. yo male  with history of HTN, DM, HLD, obesity, sleep apnea, CAD status post CABG, COPD, CKD, chronic respiratory failure on 2 L,systolic CHF with EF of 30 to 35% admitted with acute CHF exacerbation, NSTEMI, and AKI with rising creatine.  Pt does have pallative consult pending.   Clinical Impression   Pt was ambulating within his home with a RW and reports having difficulty with LB ADL prior to admission. His wife managed all IADL. Pt presents with significant weakness and decreased activity tolerance on 15L 02. Sat at EOB x 10 minutes and performed grooming and flutter valve exercise. Declined attempting to stand.  Pt stating he is "sick of being sick" and does not want to go back to rehab. Son in room and reports pt needs to be fairly independent as his wife is also elderly and has a limited ability to assist pt. Will follow acutely.    Follow Up Recommendations  SNF;Supervision/Assistance - 24 hour    Equipment Recommendations  Other (comment)(defer to next venue)    Recommendations for Other Services       Precautions / Restrictions Precautions Precautions: Fall Precaution Comments: monitor O2, on 15L Restrictions Weight Bearing Restrictions: No      Mobility Bed Mobility Overal bed mobility: Needs Assistance Bed Mobility: Supine to Sit;Sit to Supine     Supine to sit: Mod assist;HOB elevated Sit to supine: Mod assist;HOB elevated   General bed mobility comments: cues for technique;  assist with legs to EOB and back into bed; assist to elevate trunk  Transfers  General transfer comment: pt declined attempt to stand    Balance Overall balance assessment: Needs assistance Sitting-balance support: No upper extremity supported;Feet supported Sitting balance-Leahy Scale: Fair Sitting balance -  Comments: sat x 10 minutes while grooming and performing flutter valve exercises                                  ADL either performed or assessed with clinical judgement   ADL Overall ADL's : Needs assistance/impaired Eating/Feeding: Independent;Bed level   Grooming: Wash/dry hands;Wash/dry face;Sitting;Min guard;Set up   Upper Body Bathing: Moderate assistance;Sitting   Lower Body Bathing: Total assistance;Bed level   Upper Body Dressing : Moderate assistance;Sitting   Lower Body Dressing: Total assistance;Bed level                       Vision Baseline Vision/History: Wears glasses Wears Glasses: At all times Patient Visual Report: No change from baseline       Perception     Praxis      Pertinent Vitals/Pain Pain Assessment: Faces Faces Pain Scale: No hurt Pain Location: LEs     Hand Dominance Left   Extremity/Trunk Assessment Upper Extremity Assessment Upper Extremity Assessment: Generalized weakness;Overall [facility] for tasks assessed RUE Deficits / Details: ROM WFL; Strength grossly 4/5 LUE Deficits / Details: ROM WFL; Strength grossly 4/5   Lower Extremity Assessment Lower Extremity Assessment: Defer to PT evaluation RLE Deficits / Details: ROM WFL; Strength grossly 4/5; pitting edema LLE Deficits / Details: ROM WFL; Strength grossly 4/5; pitting edema   Cervical / Trunk Assessment Cervical / Trunk Assessment: Other exceptions(obese)   Communication Communication Communication: No difficulties   Cognition Arousal/Alertness: Awake/alert Behavior  During Therapy: Flat affect Overall Cognitive Status: Within Functional Limits for tasks assessed                                 General Comments: appears WFL, needs further evaluation, pt stating he was "sick of being sick" and he did not think rehab would help him   General Comments  on 15 LPM high flow O2 with sats 91%; HR 78-82 bpm    Exercises   Shoulder  Instructions      Home Living Family/patient expects to be discharged to:: Private residence Living Arrangements: Spouse/significant other Available Help at Discharge: Family;Available 24 hours/day Type of Home: House Home Access: Level entry     Home Layout: One level     Bathroom Shower/Tub: Tub/shower unit;Walk-in shower   Bathroom Toilet: Standard     Home Equipment: Environmental consultant - 2 wheels;Shower seat   Additional Comments: wife can assist minimally      Prior Functioning/Environment Level of Independence: Independent with assistive device(s)        Comments: reports he struggled to perform LB ADL        OT Problem List: Decreased strength;Impaired balance (sitting and/or standing);Obesity;Cardiopulmonary status limiting activity;Decreased activity tolerance      OT Treatment/Interventions: Self-care/ADL training;Energy conservation;DME and/or AE instruction;Patient/family education;Balance training;Therapeutic activities    OT Goals(Current goals can be found in the care plan section) Acute Rehab OT Goals Patient Stated Goal: "I don't want rehab, I want to go home and die" OT Goal Formulation: With patient Time For Goal Achievement: 04/17/19 Potential to Achieve Goals: Fair ADL Goals Pt Will Perform Grooming: with set-up;sitting(at sink) Pt Will Perform Upper Body Dressing: with set-up;sitting Pt Will Perform Lower Body Dressing: with mod assist;with adaptive equipment;sit to/from stand Pt Will Transfer to Toilet: with mod assist;stand pivot transfer;bedside commode Pt Will Perform Toileting - Clothing Manipulation and hygiene: with mod assist;sit to/from stand Additional ADL Goal #1: Pt will perform bed mobility with minimal assistance in preparation for ADL. Additional ADL Goal #2: Pt will tolerate 20 minutes of ADL with rest breaks as needed.  OT Frequency: Min 2X/week   Barriers to D/C:            Co-evaluation              AM-PAC OT "6 Clicks"  Daily Activity     Outcome Measure Help from another person eating meals?: None Help from another person taking care of personal grooming?: A Little Help from another person toileting, which includes using toliet, bedpan, or urinal?: Total Help from another person bathing (including washing, rinsing, drying)?: A Lot Help from another person to put on and taking off regular upper body clothing?: A Lot Help from another person to put on and taking off regular lower body clothing?: Total 6 Click Score: 13   End of Session Equipment Utilized During Treatment: Oxygen  Activity Tolerance: Patient limited by fatigue Patient left: in bed;with call bell/phone within reach;with family/visitor present  OT Visit Diagnosis: Unsteadiness on feet (R26.81);Muscle weakness (generalized) (M62.81)                Time: [id number] OT Time Calculation (min): 21 min Charges:  OT General Charges $OT Visit: 1 Visit OT Evaluation $OT Eval Moderate Complexity: 1 Mod  Nestor Lewandowsky, OTR/L Acute Rehabilitation Services Pager: [phone] Office: [phone]  Malka So 04/03/2019, 3:33 PM

## 2019-04-03 NOTE — Plan of Care (Signed)

## 2019-04-03 NOTE — Progress Notes (Signed)
Spoke to patient's son, Ilian Guizar and provided him a update in regards to his father's condition and plan.

## 2019-04-03 NOTE — Progress Notes (Signed)
Patient's son Ladarrian Giffen is at the bedside, updated him on his father's condition and plan of care.

## 2019-04-03 NOTE — NC FL2 (Signed)
[place] MEDICAID FL2 LEVEL OF CARE SCREENING TOOL     IDENTIFICATION  Patient Name: [name] Birthdate: 1933-09-02 Sex: male Admission Date (Current Location): 04/02/2019  [facility] and [place] Number:  Herbalist and Address:  [facility] [place], [place] [address] [place], [place] [address]      Provider Number: [id number]  Attending Physician Name and Address:  Mercy Riding, MD  Relative Name and Phone Number:  Jorda Cung, Wife, [place], Pandora Leiter, [phone]    Current Level of Care: Hospital Recommended Level of Care: [place] Prior Approval Number:    Date Approved/Denied:   PASRR Number: [id number]  Discharge Plan: SNF    Current Diagnoses: Patient Active Problem List   Diagnosis Date Noted  . Acute exacerbation of CHF (congestive heart failure) ([place]) 04/04/2019  . Acute on chronic HFrEF (heart failure with reduced ejection fraction) ([place]) 04/03/2019  . Dehydration 12/18/2018  . Acute metabolic encephalopathy 123456  . Acute renal failure superimposed on stage 3 chronic kidney disease ([place]) 12/18/2018  . AKI (acute kidney injury) ([place])   . Bilateral lower extremity edema   . HCAP (healthcare-associated pneumonia) 10/22/2018  . Pressure injury of skin 10/22/2018  . E. coli UTI 08/04/2018  . CKD stage 3 due to type 2 diabetes mellitus ([place]) 07/27/2018  . Chronic constipation 07/27/2018  . COPD (chronic obstructive pulmonary disease) ([place]) 07/27/2018  . BPH without urinary obstruction 07/27/2018  . Chronic gout due to renal impairment without tophus 07/27/2018  . Depression with anxiety 07/27/2018  . Mild aortic stenosis 07/27/2018  . Unstable angina ([place]) 07/22/2018  . Demand ischemia ([place])   . Secondary cardiomyopathy ([place])   . Chest pain 07/21/2018  . Elevated troponin 07/21/2018  . Acute on chronic diastolic heart failure ([place])   . CKD stage 3 secondary to diabetes ([place])  01/01/2014  . Chronic combined systolic and diastolic CHF (congestive heart failure) ([place]) 08/11/2012  . CAD (coronary artery disease) of artery bypass graft 08/08/2012  . Sleep apnea   . Essential hypertension, benign   . Obesity, Class III, BMI 40-49.9 (morbid obesity) ([place])   . Hyperlipidemia   . Type 2 diabetes mellitus with stage 3 chronic kidney disease, with long-term current use of insulin ([place])   . Coronary artery disease involving native coronary artery of native heart with unstable angina pectoris ([place])     Orientation RESPIRATION BLADDER Height & Weight     Self, Time, Situation, Place  O2, Other (Comment)(95, [place], 15L (trying to get him decannulated down); Bipap Sp02 95, Spio2 70, large face mask) Continent, External catheter Weight: 271 lb 9.7 oz (123.2 kg) Height:  5\' 5"  (165.1 cm)  BEHAVIORAL SYMPTOMS/MOOD NEUROLOGICAL BOWEL NUTRITION STATUS      Continent Diet(cardiac diet, thin liquids)  AMBULATORY STATUS COMMUNICATION OF NEEDS Skin   Extensive Assist Verbally Skin abrasions, Bruising, Other (Comment)(abrasion on left toe, bruising on arm/hand/knee/leg, blister on leg)                       Personal Care Assistance Level of Assistance  Bathing, Feeding, Dressing Bathing Assistance: Maximum assistance Feeding assistance: Limited assistance Dressing Assistance: Maximum assistance Total Care Assistance: Maximum assistance   Functional Limitations Info  Sight, Hearing, Speech Sight Info: Impaired Hearing Info: Impaired Speech Info: Adequate    SPECIAL CARE FACTORS FREQUENCY  PT (By licensed PT), OT (By licensed OT)     PT Frequency: 5x/wk OT  Frequency: 5x/wk            Contractures Contractures Info: Not present    Additional Factors Info  Code Status, Allergies, Psychotropic, Insulin Sliding Scale Code Status Info: DNR Allergies Info: Zocor (Simvastatin), Lipitor (Atorvastatin) Psychotropic Info: celexa 20mg  daily Insulin Sliding Scale Info:  insulin aspart novolog 0-5 units daily at bedtime; insulin glagine lantus 45 units at bedtime; insulin aspart novolog 0-9 units 3x daily w/meals; insulin aspart novolog 6 units 3x daily w/meals       Current Medications (04/03/2019):  This is the current hospital active medication list Current Facility-Administered Medications  Medication Dose Route Frequency Provider Last Rate Last Admin  . 0.9 %  sodium chloride infusion  250 mL Intravenous PRN Emokpae, Courage, MD      . acetaminophen (TYLENOL) tablet 650 mg  650 mg Oral Q6H PRN [name], Courage, MD   650 mg at 04/01/19 1149   Or  . acetaminophen (TYLENOL) suppository 650 mg  650 mg Rectal Q6H PRN Emokpae, Courage, MD      . AeroChamber Plus Flo-Vu Large MISC 1 each  1 each Other Once Cristal Deer, MD      . albuterol (PROVENTIL) (2.5 MG/3ML) 0.083% nebulizer solution 2.5 mg  2.5 mg Nebulization Q2H PRN Emokpae, Courage, MD      . albuterol (VENTOLIN HFA) 108 (90 Base) MCG/ACT inhaler 6 puff  6 puff Inhalation Once Zierle-Ghosh, Asia B, DO      . allopurinol (ZYLOPRIM) tablet 50 mg  50 mg Oral QODAY Emokpae, Courage, MD   50 mg at 04/02/19 1058  . ALPRAZolam (XANAX) tablet 1 mg  1 mg Oral BID PRN Roxan Hockey, MD   1 mg at 04/03/19 1024  . aspirin EC tablet 81 mg  81 mg Oral q morning - 10a Emokpae, Courage, MD   81 mg at 04/03/19 0843  . benzonatate (TESSALON) capsule 200 mg  200 mg Oral BID PRN Cristal Deer, MD   200 mg at 04/03/19 0959  . Chlorhexidine Gluconate Cloth 2 % PADS 6 each  6 each Topical Daily Roxan Hockey, MD   6 each at 04/03/19 0845  . Chlorhexidine Gluconate Cloth 2 % PADS 6 each  6 each Topical Q0600 Cristal Deer, MD   6 each at 04/03/19 [phone]  . citalopram (CELEXA) tablet 20 mg  20 mg Oral Daily Emokpae, Courage, MD   20 mg at 04/03/19 0843  . clopidogrel (PLAVIX) tablet 75 mg  75 mg Oral Daily Emokpae, Courage, MD   75 mg at 04/03/19 0843  . furosemide (LASIX) 120 mg in dextrose 5 % 50 mL IVPB  120 mg  Intravenous Q8H Rosita Fire, MD 62 mL/hr at 04/03/19 1446 120 mg at 04/03/19 1446  . heparin injection 5,000 Units  5,000 Units Subcutaneous Q8H Reino Bellis B, NP   5,000 Units at 04/03/19 1446  . HYDROcodone-acetaminophen (NORCO/VICODIN) 5-325 MG per tablet 1 tablet  1 tablet Oral Q6H PRN Cristal Deer, MD   1 tablet at 04/01/19 1241  . insulin aspart (novoLOG) injection 0-5 Units  0-5 Units Subcutaneous QHS Roxan Hockey, MD   2 Units at 03/29/2019 2204  . insulin aspart (novoLOG) injection 0-9 Units  0-9 Units Subcutaneous TID WC Roxan Hockey, MD   1 Units at 04/01/19 1752  . insulin aspart (novoLOG) injection 6 Units  6 Units Subcutaneous TID WC Gonfa, Taye T, MD      . insulin glargine (LANTUS) injection 45 Units  45 Units Subcutaneous QHS  Mercy Riding, MD      . isosorbide mononitrate (IMDUR) 24 hr tablet 120 mg  120 mg Oral Daily Emokpae, Courage, MD   120 mg at 04/03/19 0842  . magnesium oxide (MAG-OX) tablet 400 mg  400 mg Oral Daily Emokpae, Courage, MD   400 mg at 04/03/19 0843  . metoprolol tartrate (LOPRESSOR) tablet 50 mg  50 mg Oral BID Roxan Hockey, MD   50 mg at 04/03/19 0842  . mupirocin ointment (BACTROBAN) 2 % 1 application  1 application Nasal BID Cristal Deer, MD   1 application at [phone]  . nitroGLYCERIN (NITROSTAT) SL tablet 0.4 mg  0.4 mg Sublingual Q5 Min x 3 PRN Emokpae, Courage, MD      . ondansetron (ZOFRAN) tablet 4 mg  4 mg Oral Q6H PRN Emokpae, Courage, MD       Or  . ondansetron (ZOFRAN) injection 4 mg  4 mg Intravenous Q6H PRN Emokpae, Courage, MD   4 mg at 04/01/19 1137  . pantoprazole (PROTONIX) EC tablet 40 mg  40 mg Oral Daily Emokpae, Courage, MD   40 mg at 04/03/19 0843  . polyethylene glycol (MIRALAX / GLYCOLAX) packet 17 g  17 g Oral Daily PRN Emokpae, Courage, MD      . promethazine (PHENERGAN) tablet 25 mg  25 mg Oral Q6H PRN Cristal Deer, MD   25 mg at 04/02/19 0809  . sodium chloride flush (NS) 0.9 % injection  3 mL  3 mL Intravenous Q12H Emokpae, Courage, MD   3 mL at 04/02/19 1100  . sodium chloride flush (NS) 0.9 % injection 3 mL  3 mL Intravenous PRN Emokpae, Courage, MD      . tamsulosin (FLOMAX) capsule 0.4 mg  0.4 mg Oral QPM Emokpae, Courage, MD   0.4 mg at 04/02/19 2116  . traZODone (DESYREL) tablet 50 mg  50 mg Oral QHS PRN Roxan Hockey, MD         Discharge Medications: Please see discharge summary for a list of discharge medications.  Relevant Imaging Results:  Relevant Lab Results:   Additional Information SSN: [id number]; COVID negative on 04/08/2019  Mliss Wedin B Krzysztof Reichelt, LCSWA

## 2019-04-03 NOTE — Progress Notes (Addendum)
Progress Note  Patient Name: [name] Date of Encounter: 04/03/2019  Primary Cardiologist: [name], MD   Subjective   Briefly off Bipap this morning to eat breakfast. Remains significantly volume overloaded.    Inpatient Medications    Scheduled Meds: . AeroChamber Plus Flo-Vu Large  1 each Other Once  . albuterol  6 puff Inhalation Once  . allopurinol  50 mg Oral QODAY  . aspirin EC  81 mg Oral q morning - 10a  . Chlorhexidine Gluconate Cloth  6 each Topical Daily  . Chlorhexidine Gluconate Cloth  6 each Topical Q0600  . citalopram  20 mg Oral Daily  . clopidogrel  75 mg Oral Daily  . insulin aspart  0-5 Units Subcutaneous QHS  . insulin aspart  0-9 Units Subcutaneous TID WC  . insulin aspart  2 Units Subcutaneous TID WC  . insulin glargine  60 Units Subcutaneous QHS  . isosorbide mononitrate  120 mg Oral Daily  . magnesium oxide  400 mg Oral Daily  . metoprolol tartrate  50 mg Oral BID  . mupirocin ointment  1 application Nasal BID  . pantoprazole  40 mg Oral Daily  . sodium chloride flush  3 mL Intravenous Q12H  . tamsulosin  0.4 mg Oral QPM   Continuous Infusions: . sodium chloride    . furosemide 120 mg (04/03/19 0603)  . heparin 1,350 Units/hr (04/03/19 0701)   PRN Meds: sodium chloride, acetaminophen **OR** acetaminophen, albuterol, ALPRAZolam, benzonatate, HYDROcodone-acetaminophen, nitroGLYCERIN, ondansetron **OR** ondansetron (ZOFRAN) IV, polyethylene glycol, promethazine, sodium chloride flush, traZODone   Vital Signs    Vitals:   04/03/19 0338 04/03/19 0449 04/03/19 0736 04/03/19 0800  BP:  125/69  (!) 129/59  Pulse: 76 77 81 85  Resp: 16 18 18    Temp:  98.3 F (36.8 C)    TempSrc:  Oral    SpO2: 94% 96% 92% 92%  Weight:  123.2 kg    Height:        Intake/Output Summary (Last 24 hours) at 04/03/2019 0948 Last data filed at 04/03/2019 0701 Gross per 24 hour  Intake 587.15 ml  Output 375 ml  Net 212.15 ml   Last 3 Weights  04/03/2019 04/02/2019 04/01/2019  Weight (lbs) 271 lb 9.7 oz 270 lb 12.8 oz 268 lb 1.6 oz  Weight (kg) 123.2 kg 122.834 kg 121.609 kg      Telemetry    SR - Personally Reviewed  ECG    No new tracing this morning.   Physical Exam  Obese older WM, sitting up in bed, Heil in place.  GEN: No acute distress.   Neck: + JVD to jaw. Cardiac: RRR, no murmurs, rubs, or gallops.  Respiratory: Course rhonchi. GI: Soft, nontender, non-distended  MS: 3+ pitting bilateral LE edema, along with 1+ UE bilaterallly; No deformity. Neuro:  Nonfocal  Psych: Normal affect   Labs    High Sensitivity Troponin:   Recent Labs  Lab 04/04/2019 0935 04/11/2019 1120 03/20/2019 2005 04/12/2019 2248 04/01/19 0630  TROPONINIHS 920* 1,049* 1,519* 1,365* 2,922*      Chemistry Recent Labs  Lab 04/11/2019 0935 04/01/19 0630 04/02/19 0441 04/03/19 0820  NA 135 138 134* 134*  K 4.8 5.5* 5.5* 4.5  CL 101 103 98 101  CO2 26 18* 24 24  GLUCOSE 226* 182* 128* 107*  BUN 40* 48* 59* 70*  CREATININE 2.08* 2.63* 3.46* 3.53*  CALCIUM 8.6* 8.6* 8.6* 8.2*  PROT 6.9  --   --   --  ALBUMIN 3.6  --   --  3.0*  AST 26  --   --   --   ALT 29  --   --   --   ALKPHOS 61  --   --   --   BILITOT 0.6  --   --   --   GFRNONAA 29* 22* 15* 15*  GFRAA 33* 25* 18* 17*  ANIONGAP 8 17* 12 9     Hematology Recent Labs  Lab 04/01/19 0630 04/02/19 0441 04/03/19 0525  WBC 7.1 10.0 8.9  RBC 3.89* 4.00* 3.78*  HGB 11.1* 11.5* 10.8*  HCT 38.6* 37.5* 35.4*  MCV 99.2 93.8 93.7  MCH 28.5 28.8 28.6  MCHC 28.8* 30.7 30.5  RDW 14.6 14.7 14.6  PLT 159 203 165    BNP Recent Labs  Lab 04/08/2019 0935  BNP 1,086.0*     DDimer No results for input(s): DDIMER in the last 168 hours.   Radiology    US RENAL  Result Date: 04/02/2019 CLINICAL DATA:  Acute renal insufficiency EXAM: RENAL / URINARY TRACT ULTRASOUND COMPLETE COMPARISON:  None. FINDINGS: Right Kidney: Renal measurements: 10.2 x 4.6 x 6.7 cm = volume: 164.1 mL.  Nonobstructive 9 mm stone. Left Kidney: Renal measurements: 14.8 x 7.0 x 6.8 cm = volume: 369.6 mL. A small amount of perinephric fluid is seen. Bladder: Not visualized. Other: Right pleural effusion. IMPRESSION: 1. No hydronephrosis. Nonobstructive 9 mm stone in the right kidney. 2. A small amount of left perinephric fluid is seen, nonspecific. 3. Right pleural effusion. Electronically Signed   By: Dorise Bullion III M.D   On: 04/02/2019 17:23    Cardiac Studies   TTE: 03/18/2019  FINDINGS  Left Ventricle: Left ventricular ejection fraction, by visual estimation, is 30 to 35%. The left ventricle has moderate to severely decreased function. Definity contrast agent was given IV to delineate the left ventricular endocardial borders. The left  ventricle demonstrates regional wall motion abnormalities. The left ventricular internal cavity size was the left ventricle is normal in size. There is moderately increased left ventricular hypertrophy. Left ventricular diastolic parameters are  consistent with Grade II diastolic dysfunction (pseudonormalization).   LV Wall Scoring: The apical septal segment, mid inferolateral segment, apical inferior segment, and apex are akinetic. The entire anterior wall, basal and mid anterolateral wall, basal and mid anterior septum, basal and mid inferior septum, basal and mid inferior wall, basal inferolateral segment, and apical lateral segment are hypokinetic.  Right Ventricle: The right ventricular size is normal. No increase in right ventricular wall thickness. Global RV systolic function is has normal systolic function. The tricuspid regurgitant velocity is 2.80 m/s, and with an assumed right atrial pressure  of 15 mmHg, the estimated right ventricular systolic pressure is moderately elevated at 46.4 mmHg.  Left Atrium: Left atrial size was mild-moderately dilated.  Right Atrium: Right atrial size was normal in size  Pericardium: There is no evidence of  pericardial effusion.  Mitral Valve: The mitral valve is degenerative in appearance. There is mild calcification of the mitral valve leaflet(s). Mild mitral annular calcification. Trivial mitral valve regurgitation.  Tricuspid Valve: The tricuspid valve is grossly normal. Tricuspid valve regurgitation is trivial.  Aortic Valve: The aortic valve is tricuspid. Aortic valve regurgitation is not visualized. Mild to moderate aortic valve annular calcification.  Pulmonic Valve: The pulmonic valve was grossly normal. Pulmonic valve regurgitation is trivial. Pulmonic regurgitation is trivial.  Aorta: The aortic root is normal in size and structure.  Venous: The inferior vena cava is dilated in size with less than 50% respiratory variability, suggesting right atrial pressure of 15 mmHg.  IAS/Shunts: No atrial level shunt detected by color flow Doppler.  Patient Profile     85 y.o. male with a hx of CAD and combined CHFwho is being seen today for the evaluation of acute on chronic CHF and elevated Troponin c/w NSTEMIat the request of Dr [name].  Assessment & Plan    1. Acute on chronic combined systolic and diastolic heart failure: seen by nephrology yesterday with IV lasix q8hr. Little UOP noted overnight and weight remains elevated. Bladder scan yesterday documented at 69ml. Foley was removed.  -- with rising Cr will defer further diuretic management to renal.  -- remains on metoprolol, tolerating.   2. NSTEMI: HsT peaked at 2922, he is treated with ASA, plavix and IV heparin. Has been treated with IV heparin for over 48hrs. With worsening renal function, and respiratory.  -- Noted to have ST elevation anterolaterally on EKG 12/19 but with his tenuous respiratory status, and worsening renal function the decision was made to treat medically. He denies any chest pain.  - given > 48hrs of heparin will plan to stop today.  3. Acute on chronic renal failure: suspect this could be  cardiorenal in nature. Unforunately his Cr continues to rise with little UOP noted. Cr 2.63>>3.46>>3.53. Nephrology following.   4. Hyperkalemia: given Lokelma yesterday with improvement.  For questions or updates, please contact [place] Please consult [email or web address] for contact info under        Signed, Reino Bellis, NP  04/03/2019, 9:48 AM     Agree with note by Reino Bellis NP-C  Mr. Enciso was admitted in transfer with acute on chronic systolic heart failure.  He is a patient of Dr. Inocente Salles McDowell's in [place].  He has a history of ischemic heart disease status post bypass grafting in 1994 and multiple interventions since that time most recently in 2014 by Dr. Angelena Form with a stent to a PDA vein graft.  His EF by 2D echo was somewhat reduced compared to prior studies.  His BNP was elevated as was his troponin which peaked at 3000 with ST segment elevation inferiorly and laterally as well as poor R wave progression.  His serum creatinine is steadily rising and his I's and O's do not show significant urine output despite escalating doses of intravenous furosemide.  Agree with Dr. Judeth Cornfield assessment of "cardiorenal syndrome".  He is a DNR apparently.  He is not a candidate for coronary angiography.  He was on IV heparin which was stopped today.  At this point, we have limited options.  I suspect there should be a family Conference to discuss end-of-life considerations.  Lorretta Harp, M.D., [place], [facility], [place] [address] [place], [place]  [address]  [phone] 04/03/2019 11:03 AM

## 2019-04-04 ENCOUNTER — Inpatient Hospital Stay (HOSPITAL_COMMUNITY): Payer: PPO

## 2019-04-04 DIAGNOSIS — I509 Heart failure, unspecified: Secondary | ICD-10-CM

## 2019-04-04 DIAGNOSIS — Z7189 Other specified counseling: Secondary | ICD-10-CM

## 2019-04-04 DIAGNOSIS — Z515 Encounter for palliative care: Secondary | ICD-10-CM

## 2019-04-04 LAB — RENAL FUNCTION PANEL
Albumin: 3.1 g/dL — ABNORMAL LOW (ref 3.5–5.0)
Anion gap: 13 (ref 5–15)
BUN: 78 mg/dL — ABNORMAL HIGH (ref 8–23)
CO2: 23 mmol/L (ref 22–32)
Calcium: 8.4 mg/dL — ABNORMAL LOW (ref 8.9–10.3)
Chloride: 97 mmol/L — ABNORMAL LOW (ref 98–111)
Creatinine, Ser: 3.53 mg/dL — ABNORMAL HIGH (ref 0.61–1.24)
GFR calc Af Amer: 17 mL/min — ABNORMAL LOW (ref >60)
GFR calc non Af Amer: 15 mL/min — ABNORMAL LOW (ref >60)
Glucose, Bld: 99 mg/dL (ref 70–99)
Phosphorus: 4.8 mg/dL — ABNORMAL HIGH (ref 2.5–4.6)
Potassium: 4.8 mmol/L (ref 3.5–5.1)
Sodium: 133 mmol/L — ABNORMAL LOW (ref 135–145)

## 2019-04-04 LAB — GLUCOSE, CAPILLARY
Glucose-Capillary: 96 mg/dL (ref 70–99)
Glucose-Capillary: 124 mg/dL — ABNORMAL HIGH (ref 70–99)
Glucose-Capillary: 128 mg/dL — ABNORMAL HIGH (ref 70–99)
Glucose-Capillary: 119 mg/dL — ABNORMAL HIGH (ref 70–99)

## 2019-04-04 LAB — CBC
HCT: 36 % — ABNORMAL LOW (ref 39.0–52.0)
Hemoglobin: 11.2 g/dL — ABNORMAL LOW (ref 13.0–17.0)
MCH: 28.4 pg (ref 26.0–34.0)
MCHC: 31.1 g/dL (ref 30.0–36.0)
MCV: 91.4 fL (ref 80.0–100.0)
Platelets: 186 10*3/uL (ref 150–400)
RBC: 3.94 MIL/uL — ABNORMAL LOW (ref 4.22–5.81)
RDW: 14.6 % (ref 11.5–15.5)
WBC: 8.3 10*3/uL (ref 4.0–10.5)
nRBC: 0 % (ref 0.0–0.2)

## 2019-04-04 LAB — MAGNESIUM: Magnesium: 2.1 mg/dL (ref 1.7–2.4)

## 2019-04-04 MED ORDER — ESCITALOPRAM OXALATE 10 MG PO TABS
5.0000 mg | ORAL_TABLET | Freq: Every day | ORAL | Status: DC
  Administered 2019-04-05: 5 mg via ORAL
  Filled 2019-04-04: qty 1

## 2019-04-04 MED ORDER — DEXTROSE 5 % IV SOLN
500.0000 mg | Freq: Two times a day (BID) | INTRAVENOUS | Status: DC
  Administered 2019-04-04 – 2019-04-05 (×3): 500 mg via INTRAVENOUS
  Filled 2019-04-04 (×4): qty 500

## 2019-04-04 NOTE — Progress Notes (Signed)
Patient lethargic and not able to answer all questions.  Rapid response notified and [place] notified.

## 2019-04-04 NOTE — Progress Notes (Signed)
[facility] PROGRESS NOTE  Assessment/ Plan: Pt is a 85 y.o. yo male  with history of HTN, DM, HLD, obesity, sleep apnea, CAD status post CABG, COPD, CKD, chronic respiratory failure on 2 L,systolic CHF with EF of 30 to 35% admitted with acute CHF exacerbation seen as a consultation  for AKI on CKD and fluid overload.  #AKI on CKD, nonoliguric likely cardiorenal syndrome with reduced effective renal perfusion.  UA was cloudy some protein and RBC unknown if it is traumatic.  Recently the urine was normal.  Doubt it is GN. Not much response with IV diuretics (Lasix and a dose of chlorothiazide ), urine output is around 1200 ml/24 hr. He has significant volume overload therefore I am planning to continue IV Lasix and add chlorothiazide IV twice daily.  If no improvement in urine output then he will probably need dialysis.  Given his comorbidities and physical deconditioning, he cannot probably tolerate outpatient dialysis.  Noted he is DNR and palliative care was consulted to discuss goals of care.  No urgent indication for dialysis today. Discussed with the primary team, continue to follow with you. Bladder scan with no retention, continue strict ins and out, US renal no hydronephrosis.  #Hyperkalemia: In the setting of AKI.  Improved with diuretics.   #Acute CHF exacerbation/and NSTEMI: IV heparin and cardiology is following.  LVEF 30 to 35%.  #Acute on chronic hypoxic respiratory failure with pulmonary edema: Diuresis as above.  Subjective: Seen and examined at bedside.  Remains on oxygen.  Not in distress.  Denies nausea vomiting.  No chest pain.  No new event.  Objective Vital signs in last 24 hours: Vitals:   04/03/19 2325 04/04/19 0324 04/04/19 0509 04/04/19 1002  BP:   137/71 134/79  Pulse: 84 80 75 89  Resp: 18 16 20    Temp:   99 F (37.2 C)   TempSrc:   Oral   SpO2: 95% 94% 94% 92%  Weight:   123.9 kg   Height:       Weight change: 0.7  kg  Intake/Output Summary (Last 24 hours) at 04/04/2019 1026 Last data filed at 04/04/2019 0900 Gross per 24 hour  Intake 846.77 ml  Output 1200 ml  Net -353.23 ml       Labs: Basic Metabolic Panel: Recent Labs  Lab 04/02/19 0441 04/03/19 0820 04/04/19 0451  NA 134* 134* 133*  K 5.5* 4.5 4.8  CL 98 101 97*  CO2 24 24 23   GLUCOSE 128* 107* 99  BUN 59* 70* 78*  CREATININE 3.46* 3.53* 3.53*  CALCIUM 8.6* 8.2* 8.4*  PHOS  --  4.8* 4.8*   Liver Function Tests: Recent Labs  Lab 04/03/2019 0935 04/03/19 0820 04/04/19 0451  AST 26  --   --   ALT 29  --   --   ALKPHOS 61  --   --   BILITOT 0.6  --   --   PROT 6.9  --   --   ALBUMIN 3.6 3.0* 3.1*   Recent Labs  Lab 03/25/2019 0935  LIPASE 28   No results for input(s): AMMONIA in the last 168 hours. CBC: Recent Labs  Lab 03/30/2019 0935 04/01/19 0630 04/02/19 0441 04/03/19 0525 04/04/19 0451  WBC 10.5 7.1 10.0 8.9 8.3  NEUTROABS 8.0*  --  8.1*  --   --   HGB 11.7* 11.1* 11.5* 10.8* 11.2*  HCT 38.3* 38.6* 37.5* 35.4* 36.0*  MCV 93.9 99.2 93.8 93.7 91.4  PLT  193 159 203 165 186   Cardiac Enzymes: No results for input(s): CKTOTAL, CKMB, CKMBINDEX, TROPONINI in the last 168 hours. CBG: Recent Labs  Lab 04/03/19 0606 04/03/19 1156 04/03/19 1555 04/03/19 2103 04/04/19 0652  GLUCAP 83 92 97 100* 96    Iron Studies: No results for input(s): IRON, TIBC, TRANSFERRIN, FERRITIN in the last 72 hours. Studies/Results: US RENAL  Result Date: 04/02/2019 CLINICAL DATA:  Acute renal insufficiency EXAM: RENAL / URINARY TRACT ULTRASOUND COMPLETE COMPARISON:  None. FINDINGS: Right Kidney: Renal measurements: 10.2 x 4.6 x 6.7 cm = volume: 164.1 mL. Nonobstructive 9 mm stone. Left Kidney: Renal measurements: 14.8 x 7.0 x 6.8 cm = volume: 369.6 mL. A small amount of perinephric fluid is seen. Bladder: Not visualized. Other: Right pleural effusion. IMPRESSION: 1. No hydronephrosis. Nonobstructive 9 mm stone in the right  kidney. 2. A small amount of left perinephric fluid is seen, nonspecific. 3. Right pleural effusion. Electronically Signed   By: Dorise Bullion III M.D   On: 04/02/2019 17:23    Medications: Infusions: . sodium chloride 250 mL (04/03/19 2054)  . furosemide 120 mg (04/04/19 0705)    Scheduled Medications: . AeroChamber Plus Flo-Vu Large  1 each Other Once  . albuterol  6 puff Inhalation Once  . allopurinol  50 mg Oral QODAY  . aspirin EC  81 mg Oral q morning - 10a  . Chlorhexidine Gluconate Cloth  6 each Topical Daily  . Chlorhexidine Gluconate Cloth  6 each Topical Q0600  . clopidogrel  75 mg Oral Daily  . [START ON 04-25-19] escitalopram  5 mg Oral Daily  . heparin injection (subcutaneous)  5,000 Units Subcutaneous Q8H  . insulin aspart  0-5 Units Subcutaneous QHS  . insulin aspart  0-9 Units Subcutaneous TID WC  . insulin aspart  6 Units Subcutaneous TID WC  . insulin glargine  40 Units Subcutaneous QHS  . isosorbide mononitrate  120 mg Oral Daily  . magnesium oxide  400 mg Oral Daily  . metoprolol tartrate  50 mg Oral BID  . mupirocin ointment  1 application Nasal BID  . pantoprazole  40 mg Oral Daily  . sodium chloride flush  3 mL Intravenous Q12H  . tamsulosin  0.4 mg Oral QPM    have reviewed scheduled and prn medications.  Physical Exam: General: Ill-looking male, not in apparent distress Heart:RRR, s1s2 nl, no rub Lungs: Bibasal crackle, no increased work of breathing Abdomen:soft, Non-tender, -distended Extremities: Lower extremity edema++ Neurology: Alert, awake and following commands.  No asterixis.  Jayon Matton Prasad Flem Enderle 04/04/2019,10:26 AM  LOS: 4 days  Pager: ID:[phone]

## 2019-04-04 NOTE — Progress Notes (Signed)
PROGRESS NOTE  [name] [id number] DOB: Jan 12, 1934   PCP: Rosalee Kaufman, PA-C  Patient is from: Home  DOA: 04/08/2019 LOS: 4  Brief Narrative / Interim history: 85 y.o. yo male  with history of HTN, DM, HLD, obesity, OSA, CAD/CABG in 1994, COPD,  chronic RF on 2 L, [id number], systolic CHF with EF of 30 to 35% [facility] with [facility]ness of breath and admitted with acute CHF exacerbation.  Transferred to [facility] due to worsening EF with new RWMA and elevated troponin for cardiology evaluation.  Course complicated by AKI on 99991111.  Cardiology, nephrology and palliative care following.  Subjective: No major events overnight of this morning.  Continues to endorse shortness of breath and cough.  He is on HFNC.  Likes to go back on BiPAP.  Objective: Vitals:   04/04/19 0509 04/04/19 1002 04/04/19 1235 04/04/19 1341  BP: 137/71 134/79  121/66  Pulse: 75 89 71 70  Resp: 20   20  Temp: 99 F (37.2 C)   98.6 F (37 C)  TempSrc: Oral     SpO2: 94% 92% 93% 94%  Weight: 123.9 kg     Height:        Intake/Output Summary (Last 24 hours) at 04/04/2019 1355 Last data filed at 04/04/2019 0900 Gross per 24 hour  Intake 846.77 ml  Output 1200 ml  Net -353.23 ml   Filed Weights   04/02/19 0428 04/03/19 0449 04/04/19 0509  Weight: 122.8 kg 123.2 kg 123.9 kg    Examination:  GENERAL: No apparent distress.  Morbidly obese. HEENT: MMM.  Vision and hearing grossly intact.  NECK: Supple.  Difficult to assess JVD due to body habitus. RESP:  No IWOB.  Fair aeration bilaterally. CVS:  RRR. Heart sounds normal.  ABD/GI/GU: Bowel sounds present. Soft. Non tender.  MSK/EXT:  Moves extremities. No apparent deformity.  2+ pitting edema bilaterally. SKIN: no apparent skin lesion or wound NEURO: Awake, alert and oriented appropriately.  No apparent focal neuro deficit. PSYCH: Calm. Normal affect.  Procedures:  None  Assessment & Plan: Acute on chronic respiratory  failure with hypoxia: Multifactorial including acute CHF, non-STEMI, underlying COPD, OSA and possible OHS. -Alternating between BiPAP and 50 L by HFNC.  Patient is DNR/DNI. -Treat treatable causes as below.  Acute on chronic combined CHF: Echo on 12/18 with EF of 30 to 35% (40 to 45% previously), RWMA, G2 DD and RVSP to 46. Had only 675 cc UOP in the last 24 hours on IV Lasix 120 mg 3 times daily with chlorthalidone.  Had about 1 L UOP/24 hours.  Weight up trended.  Remains fluid overloaded.  Creatinine seems to be plateauing. -Nephrology managing diuretics. -Cardiology following. -Continue monitoring fluid status, renal function and electrolytes. -Sodium and fluid restrictions.  Non-STEMI/inferolateral STEMI: High-sensitivity troponin peaked at 3000.  Unfortunately, not an ideal candidate for cardiac due to AKI -Status post IV heparin for 48 hours. -Continue metoprolol, Imdur, aspirin, Plavix and as needed nitro. -Has a history of statin intolerance.  AKI on CKD-3a/azotemia: creatinine plateauing.  Azotemia worse. -Nephrology managing  Hyponatremia: Likely due to renal failure.  Stable. -Continue monitoring  Uncontrolled diabetes with renal failure:: A1c 7.4%. Recent Labs    04/03/19 2103 04/04/19 0652 04/04/19 1106  GLUCAP 100* 96 124*  -Continue current insulin regimen  -Has history of statin intolerance.  Chronic COPD: Stable.  Does not seem to be on medication for this at home. -Continue as needed breathing treatments  OSA on  CPAP: Currently on BiPAP and HFNC.. -Continue intermittent and nightly BiPAP  Morbid obesity: BMI 45  BPH with LUTS: Urine culture negative. -Continue Flomax  GERD -Continue Protonix  Goal of care discussion: patient is DNR/DNI.  Significant comorbidities.  Very guarded prognosis.  Discussed this with patient's son, Shonna Chock over the phone on 12/21.  He says his father's health has been in decline.  He is agreeable to palliative care consult  -Palliative care following-appreciate input.           DVT prophylaxis: On subcu heparin Code Status: DNR/DNI Family Communication: Updated patient's son, Shonna Chock over the phone on 12/21.Marland Kitchen  Disposition Plan: Remains inpatient Consultants: Cardiology, nephrology and palliative care   Microbiology summarized: [id number] negative. Urine culture negative. MRSA PCR positive.  Sch Meds:  Scheduled Meds: . AeroChamber Plus Flo-Vu Large  1 each Other Once  . albuterol  6 puff Inhalation Once  . allopurinol  50 mg Oral QODAY  . aspirin EC  81 mg Oral q morning - 10a  . Chlorhexidine Gluconate Cloth  6 each Topical Daily  . Chlorhexidine Gluconate Cloth  6 each Topical Q0600  . clopidogrel  75 mg Oral Daily  . [START ON 2019-04-19] escitalopram  5 mg Oral Daily  . heparin injection (subcutaneous)  5,000 Units Subcutaneous Q8H  . insulin aspart  0-5 Units Subcutaneous QHS  . insulin aspart  0-9 Units Subcutaneous TID WC  . insulin aspart  6 Units Subcutaneous TID WC  . insulin glargine  40 Units Subcutaneous QHS  . isosorbide mononitrate  120 mg Oral Daily  . magnesium oxide  400 mg Oral Daily  . metoprolol tartrate  50 mg Oral BID  . mupirocin ointment  1 application Nasal BID  . pantoprazole  40 mg Oral Daily  . sodium chloride flush  3 mL Intravenous Q12H  . tamsulosin  0.4 mg Oral QPM   Continuous Infusions: . sodium chloride 250 mL (04/03/19 2054)  . chlorothiazide (DIURIL) IV    . furosemide 120 mg (04/04/19 0705)   PRN Meds:.sodium chloride, acetaminophen **OR** acetaminophen, albuterol, ALPRAZolam, benzonatate, HYDROcodone-acetaminophen, nitroGLYCERIN, ondansetron **OR** ondansetron (ZOFRAN) IV, polyethylene glycol, promethazine, sodium chloride flush, traZODone  Antimicrobials: Anti-infectives (From admission, onward)   Start     Dose/Rate Route Frequency Ordered Stop   03/20/2019 2000  cefTRIAXone (ROCEPHIN) 1 g in sodium chloride 0.9 % 100 mL IVPB  Status:  Discontinued      1 g 200 mL/hr over 30 Minutes Intravenous Every 24 hours 03/17/2019 1910 04/02/19 0959       I have personally reviewed the following labs and images: CBC: Recent Labs  Lab 04/03/2019 0935 04/01/19 0630 04/02/19 0441 04/03/19 0525 04/04/19 0451  WBC 10.5 7.1 10.0 8.9 8.3  NEUTROABS 8.0*  --  8.1*  --   --   HGB 11.7* 11.1* 11.5* 10.8* 11.2*  HCT 38.3* 38.6* 37.5* 35.4* 36.0*  MCV 93.9 99.2 93.8 93.7 91.4  PLT 193 159 203 165 186   BMP &GFR Recent Labs  Lab 03/26/2019 1120 04/01/19 0630 04/02/19 0441 04/03/19 0820 04/04/19 0451  NA 136 138 134* 134* 133*  K 4.7 5.5* 5.5* 4.5 4.8  CL 100 103 98 101 97*  CO2 26 18* 24 24 23   GLUCOSE 231* 182* 128* 107* 99  BUN 40* 48* 59* 70* 78*  CREATININE 2.11* 2.63* 3.46* 3.53* 3.53*  CALCIUM 8.6* 8.6* 8.6* 8.2* 8.4*  MG  --   --   --  1.9 2.1  PHOS  --   --   --  4.8* 4.8*   Estimated Creatinine Clearance: 19.4 mL/min (A) (by C-G formula based on SCr of 3.53 mg/dL (H)). Liver & Pancreas: Recent Labs  Lab 04/10/2019 0935 04/03/19 0820 04/04/19 0451  AST 26  --   --   ALT 29  --   --   ALKPHOS 61  --   --   BILITOT 0.6  --   --   PROT 6.9  --   --   ALBUMIN 3.6 3.0* 3.1*   Recent Labs  Lab 03/19/2019 0935  LIPASE 28   No results for input(s): AMMONIA in the last 168 hours. Diabetic: No results for input(s): HGBA1C in the last 72 hours. Recent Labs  Lab 04/03/19 1156 04/03/19 1555 04/03/19 2103 04/04/19 0652 04/04/19 1106  GLUCAP 92 97 100* 96 124*   Cardiac Enzymes: No results for input(s): CKTOTAL, CKMB, CKMBINDEX, TROPONINI in the last 168 hours. No results for input(s): PROBNP in the last 8760 hours. Coagulation Profile: No results for input(s): INR, PROTIME in the last 168 hours. Thyroid Function Tests: No results for input(s): TSH, T4TOTAL, FREET4, T3FREE, THYROIDAB in the last 72 hours. Lipid Profile: No results for input(s): CHOL, HDL, LDLCALC, TRIG, CHOLHDL, LDLDIRECT in the last 72 hours. Anemia  Panel: No results for input(s): VITAMINB12, FOLATE, FERRITIN, TIBC, IRON, RETICCTPCT in the last 72 hours. Urine analysis:    Component Value Date/Time   COLORURINE YELLOW 04/02/2019 1701   APPEARANCEUR CLOUDY (A) 04/02/2019 1701   LABSPEC 1.013 04/02/2019 1701   PHURINE 5.0 04/02/2019 1701   GLUCOSEU NEGATIVE 04/02/2019 1701   HGBUR LARGE (A) 04/02/2019 1701   BILIRUBINUR NEGATIVE 04/02/2019 1701   KETONESUR NEGATIVE 04/02/2019 1701   PROTEINUR 30 (A) 04/02/2019 1701   NITRITE NEGATIVE 04/02/2019 1701   LEUKOCYTESUR MODERATE (A) 04/02/2019 1701   Sepsis Labs: Invalid input(s): PROCALCITONIN, [place]  Microbiology: Recent Results (from the past 240 hour(s))  Urine culture     Status: None   Collection Time: 04/07/2019  9:34 AM   Specimen: Urine, Catheterized  Result Value Ref Range Status   Specimen Description   Final    URINE, CATHETERIZED Performed at [facility], [address], [place], [place] [address]    Special Requests   Final    NONE Performed at [facility], [address], [place], [place] [address]    Culture   Final    NO GROWTH Performed at [facility], [place] [address], [place], [place] [address]    Report Status 04/01/2019 FINAL  Final  SARS CORONAVIRUS 2 (TAT 6-24 HRS) Nasopharyngeal Nasopharyngeal Swab     Status: None   Collection Time: 04/01/2019 11:16 AM   Specimen: Nasopharyngeal Swab  Result Value Ref Range Status   SARS Coronavirus 2 NEGATIVE NEGATIVE Final    Comment: (NOTE) SARS-CoV-2 target nucleic acids are NOT DETECTED. The SARS-CoV-2 RNA is generally detectable in upper and lower respiratory specimens during the acute phase of infection. Negative results do not preclude SARS-CoV-2 infection, do not rule out co-infections with other pathogens, and should not be used as the sole basis for treatment or other patient management decisions. Negative results must be combined with clinical observations, patient history, and  epidemiological information. The expected result is Negative. Fact Sheet for Patients: SugarRoll.be Fact Sheet for Healthcare Providers: https://www.woods-mathews.com/ This test is not yet approved or cleared by the [place] FDA and  has been authorized for detection and/or diagnosis of SARS-CoV-2 by FDA under an Emergency Use Authorization (EUA). This EUA  will remain  in effect (meaning this test can be used) for the duration of the COVID-19 declaration under Section 56 4(b)(1) of the Act, 21 U.S.C. section 360bbb-3(b)(1), unless the authorization is terminated or revoked sooner. Performed at [facility], [place] [address] [place], [place] [address]   Respiratory Panel by RT PCR (Flu A&B, Covid) - Nasopharyngeal Swab     Status: None   Collection Time: 04/04/2019  8:16 PM   Specimen: Nasopharyngeal Swab  Result Value Ref Range Status   SARS Coronavirus 2 by RT PCR NEGATIVE NEGATIVE Final    Comment: (NOTE) SARS-CoV-2 target nucleic acids are NOT DETECTED. The SARS-CoV-2 RNA is generally detectable in upper respiratoy specimens during the acute phase of infection. The lowest concentration of SARS-CoV-2 viral copies this assay can detect is 131 copies/mL. A negative result does not preclude SARS-Cov-2 infection and should not be used as the sole basis for treatment or other patient management decisions. A negative result may occur with  improper specimen collection/handling, submission of specimen other than nasopharyngeal swab, presence of viral mutation(s) within the areas targeted by this assay, and inadequate number of viral copies (<131 copies/mL). A negative result must be combined with clinical observations, patient history, and epidemiological information. The expected result is Negative. Fact Sheet for Patients:  PinkCheek.be Fact Sheet for Healthcare Providers:   GravelBags.it This test is not yet ap proved or cleared by the [place] FDA and  has been authorized for detection and/or diagnosis of SARS-CoV-2 by FDA under an Emergency Use Authorization (EUA). This EUA will remain  in effect (meaning this test can be used) for the duration of the COVID-19 declaration under Section 564(b)(1) of the Act, 21 U.S.C. section 360bbb-3(b)(1), unless the authorization is terminated or revoked sooner.    Influenza A by PCR NEGATIVE NEGATIVE Final   Influenza B by PCR NEGATIVE NEGATIVE Final    Comment: (NOTE) The Xpert Xpress SARS-CoV-2/FLU/RSV assay is intended as an aid in  the diagnosis of influenza from Nasopharyngeal swab specimens and  should not be used as a sole basis for treatment. Nasal washings and  aspirates are unacceptable for Xpert Xpress SARS-CoV-2/FLU/RSV  testing. Fact Sheet for Patients: PinkCheek.be Fact Sheet for Healthcare Providers: GravelBags.it This test is not yet approved or cleared by the [place] FDA and  has been authorized for detection and/or diagnosis of SARS-CoV-2 by  FDA under an Emergency Use Authorization (EUA). This EUA will remain  in effect (meaning this test can be used) for the duration of the  Covid-19 declaration under Section 564(b)(1) of the Act, 21  U.S.C. section 360bbb-3(b)(1), unless the authorization is  terminated or revoked. Performed at [facility], [address] [place], [place] [address]   MRSA PCR Screening     Status: Abnormal   Collection Time: 04/02/19  8:54 PM   Specimen: Nasopharyngeal  Result Value Ref Range Status   MRSA by PCR POSITIVE (A) NEGATIVE Final    Comment: CRITICAL RESULT CALLED TO, READ BACK BY AND VERIFIED WITH: RN, Arvella Nigh [id number] @2322  THANEY Performed at [facility], [address] [place],  [address]     Radiology Studies: No results found.   Amaris Delafuente T.  [place]  If 7PM-7AM, please contact night-coverage [email or web address] Password [email or web address] 04/04/2019, 1:55 PM

## 2019-04-04 NOTE — Patient Outreach (Signed)
Received HTA IP discharge, emailed to [facility] for processing on 04/04/19 - [name] M. [name]

## 2019-04-04 NOTE — Consult Note (Signed)
Consultation Note Date: 04/04/2019   Patient Name: [name]  DOB: 05-18-1933  MRN: [id number]  Age / Sex: 85 y.o., male  PCP: Rosalee Kaufman, PA-C Referring Physician: Mercy Riding, MD  Reason for Consultation: Goals Of Care  HPI/Patient Profile:  Per most recent TRH progress note --> 29 y.o.yo malewith history of HTN, DM, HLD, obesity, OSA, CAD/CABG in 1994, COPD,  chronic RF on 2 L, [id number], systolic CHF with EF of 30 to 35% [facility] with shortness of breath and admitted with acute CHF exacerbation.  Transferred to [facility] due to worsening EF with new RWMA and elevated troponin for cardiology evaluation.  Course complicated by AKI on CKD.  Nephrology involved as well.  Clinical Assessment and Goals of Care: Reviewed patients chart. [name] has had three admissions in the last six months for recurrent exacerbation of heart failure/ckd. Patients most recent admission led to SNF stay whereby he got stronger though went back into "old habits" when he got home.   He is presently receiving care for worsening heart failure with troponin elevation. Cardiology and nephrology are actively involved in patients care. He is presently receiving aggressive diuresis. Focus is to treat as able. Palliative care was consulted to address goals of care given worsening of heart failure.   Patient evaluated this morning, noted to be on bipap. Reported feeling "not well". He was fatigued during questioning therefore consented to my speaking with his wife to learn a little bit more about him.    Collateral information obtained from patients wife. Patients wife, [name] and he have been married for over 22 years. Reviewed bADLs patient was able to get dressed on his own, eat by himself though his wife fixed his meals. Noted to be eating less and losing weight per his wife.  Appeared well initially when  he came home from the hospital. Able to go to the bathroom had trouble with intermittent diarrhea. Was taking medications on his own but would omit medications that made him "go to the bathroom" frequently. For the most part able to care for self until hospitalization.  Per wife it has betting more difficult to care for patient at home. Patients children help when they are able. One son suffers from health ailments of his own. [name] states that her son, [name] is more involved with care of the patient. They have a granddaughter who does grocery shopping Joslyn Devon their son Raymond's daughter) and picks up medications. Granddaughter does grocery shopping Joslyn Devon their son Raymond's daughter picks up medications.  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 He got to a point where he could no longer do all "the running and visiting" associated with his job. Patient got more physically stagnant when no longer able to go to church. Per wife he lost interest in doing everything after that and watched television much of the day.   Spoke to Hartman in the morning who said that the situation now is very difficult as the patients  family is unable to help them as they all have their own lives. [name] feels his parents need to go to an assisted living facility. They are unwilling to pay anymore money than they need to for care. We talked about the fact that they are in their right mind and able to make that decision.   Discussed with patients son, [name] and patients wife the worsening of his heart failure and the relationship with his kidney disease. Introduced the concepts of Palliative care and hospice care. At the present time the goal is to strengthen and go back home. Family is amenable to Palliative Care consultation for home. Stated that more education needs to be done as he should take his medications as prescribed and maintain appropriate fluid intake, not exceeding the recommendations. Also  discussed the idea of focusing on symptoms and not transporting back and forth to the hospital which they are not ready for presently.   We will continue to follow and offer support daily.   SUMMARY OF RECOMMENDATIONS   DNAR/DNI Goal is to get to rehabilitation and get stronger PT/OT Chaplain Consult Para-medicine consult for Heart Failure, if eligable Home Palliative Care consultation  Code Status/Advance Care Planning:  DNR/DNI   Symptom Management:   No additional recommendations  Palliative Prophylaxis:   Aspiration, Delirium Protocol, Frequent Pain Assessment and Oral Care  Additional Recommendations (Limitations, Scope, Preferences):  Minimize Medications  Psycho-social/Spiritual:   Desire for further Chaplaincy support: Yes  Additional Recommendations: Caregiving  Support/Resources  Prognosis:   < 6 months,   Discharge Planning: [place] for rehab with Palliative care service follow-up      Primary Diagnoses: Present on Admission: . Acute on chronic HFrEF (heart failure with reduced ejection fraction) ([place]) . Coronary artery disease involving native coronary artery of native heart with unstable angina pectoris ([place]) . Essential hypertension, benign . Obesity, Class III, BMI 40-49.9 (morbid obesity) ([place]) . Sleep apnea . Chronic combined systolic and diastolic CHF (congestive heart failure) ([place]) . CKD stage 3 secondary to diabetes ([place]) . CAD (coronary artery disease) of artery bypass graft . Elevated troponin  I have reviewed the medical record, interviewed the patient and family, and examined the patient. The following aspects are pertinent.  Past Medical History:  Diagnosis Date  . Allergic rhinitis   . Anxiety   . Arthritis   . COPD (chronic obstructive pulmonary disease) ([place])   . Coronary atherosclerosis of native coronary artery    a. CABG x 4 in 1994. Previous stent placement to SVG to distal RCA;  b. DES to native Cx in  2006;  c. DES SVG to RCA 05/08/11; d. Inf MI/Cath/PCI: LM 25, LAD 100, LCX patent stent, 50 into OM, RI 60-70, RCA 100, LIMA->LAD nl, VG->Diag 100 old, VG->RCA/PL 99 (DES x 1), EF 50-55%.  . Depression    With anxious features/hx panic attacks  . Essential hypertension, benign   . GERD (gastroesophageal reflux disease)   . Hyperlipidemia    Not on statin 2/2 hx of intolerance.   . Morbid obesity ([place])   . Nephrolithiasis   . Pneumonia   . Sleep apnea   . ST elevation myocardial infarction (STEMI) of inferior wall ([place])    4/14 - DES SVG to PDA  . Type 2 diabetes mellitus ([place])    Social History   Socioeconomic History  . Marital status: Married    Spouse name: Not on file  . Number of children: Not on file  . Years  of education: Not on file  . Highest education level: Not on file  Occupational History  . Not on file  Tobacco Use  . Smoking status: Never Smoker  . Smokeless tobacco: Never Used  Substance and Sexual Activity  . Alcohol use: No    Alcohol/week: 0.0 standard drinks  . Drug use: No  . Sexual activity: Not Currently  Other Topics Concern  . Not on file  Social History Narrative  . Not on file   Social Determinants of Health   Financial Resource Strain:   . Difficulty of Paying Living Expenses: Not on file  Food Insecurity:   . Worried About Charity fundraiser in the Last Year: Not on file  . Ran Out of Food in the Last Year: Not on file  Transportation Needs:   . Lack of Transportation (Medical): Not on file  . Lack of Transportation (Non-Medical): Not on file  Physical Activity:   . Days of Exercise per Week: Not on file  . Minutes of Exercise per Session: Not on file  Stress:   . Feeling of Stress : Not on file  Social Connections:   . Frequency of Communication with Friends and Family: Not on file  . Frequency of Social Gatherings with Friends and Family: Not on file  . Attends Religious Services: Not on file  . Active Member of Clubs or  Organizations: Not on file  . Attends Archivist Meetings: Not on file  . Marital Status: Not on file   Family History  Problem Relation Age of Onset  . Heart attack Mother        Age 64   Father died in MVA on new years eve 18 Grandfather died of 38 influenza  Scheduled Meds: . AeroChamber Plus Flo-Vu Large  1 each Other Once  . albuterol  6 puff Inhalation Once  . allopurinol  50 mg Oral QODAY  . aspirin EC  81 mg Oral q morning - 10a  . Chlorhexidine Gluconate Cloth  6 each Topical Daily  . Chlorhexidine Gluconate Cloth  6 each Topical Q0600  . citalopram  20 mg Oral Daily  . clopidogrel  75 mg Oral Daily  . heparin injection (subcutaneous)  5,000 Units Subcutaneous Q8H  . insulin aspart  0-5 Units Subcutaneous QHS  . insulin aspart  0-9 Units Subcutaneous TID WC  . insulin aspart  6 Units Subcutaneous TID WC  . insulin glargine  40 Units Subcutaneous QHS  . isosorbide mononitrate  120 mg Oral Daily  . magnesium oxide  400 mg Oral Daily  . metoprolol tartrate  50 mg Oral BID  . mupirocin ointment  1 application Nasal BID  . pantoprazole  40 mg Oral Daily  . sodium chloride flush  3 mL Intravenous Q12H  . tamsulosin  0.4 mg Oral QPM   Continuous Infusions: . sodium chloride 250 mL (04/03/19 2054)  . furosemide 120 mg (04/04/19 0705)   PRN Meds:.sodium chloride, acetaminophen **OR** acetaminophen, albuterol, ALPRAZolam, benzonatate, HYDROcodone-acetaminophen, nitroGLYCERIN, ondansetron **OR** ondansetron (ZOFRAN) IV, polyethylene glycol, promethazine, sodium chloride flush, traZODone Medications Prior to Admission:  Prior to Admission medications   Medication Sig Start Date End Date Taking? Authorizing Provider  allopurinol (ZYLOPRIM) 100 MG tablet Take 50 mg by mouth every other day.   Yes [provider]  alprazolam Duanne Moron) 2 MG tablet Take 2 mg by mouth 2 (two) times daily. 03/14/19  Yes [provider]  aspirin EC 81 MG tablet Take 81 mg  by mouth every morning.   Yes [provider]  clopidogrel (PLAVIX) 75 MG tablet TAKE 1 TABLET BY MOUTH EVERY DAY WITH BREAKFAST 11/28/18  Yes Satira Sark, MD  Dulaglutide (TRULICITY) A999333 0000000 SOPN Inject into the skin once a week. Every Friday.   Yes [provider]  escitalopram (LEXAPRO) 5 MG tablet Take 1 tablet by mouth daily. 11/29/18  Yes [provider]  hydrALAZINE (APRESOLINE) 10 MG tablet Take 2 tablets (20 mg total) by mouth 3 (three) times daily. 02/20/19  Yes Satira Sark, MD  insulin aspart (NOVOLOG) 100 UNIT/ML injection Inject 7 Units into the skin 3 (three) times daily with meals. Give only if eats 50% or more of meal. 08/17/18  Yes Gerlene Fee, NP  Insulin Glargine, 2 Unit Dial, (TOUJEO MAX SOLOSTAR) 300 UNIT/ML SOPN Inject 60 Units into the skin at bedtime. 08/17/18  Yes Gerlene Fee, NP  isosorbide mononitrate (IMDUR) 120 MG 24 hr tablet Take 1 tablet (120 mg total) by mouth daily. 02/08/19  Yes Satira Sark, MD  magnesium oxide (MAG-OX) 400 (241.3 Mg) MG tablet Take 1 tablet (400 mg total) by mouth daily. 12/25/18  Yes Tat, Shanon Brow, MD  metoprolol tartrate (LOPRESSOR) 50 MG tablet TAKE 1 TABLET(50 MG) BY MOUTH TWICE DAILY Patient taking differently: Take 50 mg by mouth 2 (two) times daily.  10/19/18  Yes Satira Sark, MD  pantoprazole (PROTONIX) 40 MG tablet Take 1 tablet (40 mg total) by mouth daily. 12/24/18  Yes Tat, Shanon Brow, MD  psyllium (METAMUCIL) 58.6 % powder Take 2 tsp by mouth: Mix with 8 ounces liquid and drink once a day   Yes [provider]  tamsulosin (FLOMAX) 0.4 MG CAPS capsule Take 1 capsule (0.4 mg total) by mouth every evening. 08/17/18  Yes Gerlene Fee, NP  torsemide (DEMADEX) 20 MG tablet Take 1 tablet (20 mg total) by mouth daily. Beginning 08/05/2018 10/26/18  Yes Barton Dubois, MD  acetaminophen (TYLENOL) 325 MG tablet Take 650 mg by mouth every 4 (four) hours as needed.    [provider]  nitroGLYCERIN (NITROLINGUAL) 0.4 MG/SPRAY spray Place 1 spray under the tongue every 5 (five) minutes x 3 doses as needed for chest pain. 08/17/18   Gerlene Fee, NP  OXYGEN Inhale 2 L/min into the lungs continuous.    [provider]  UNABLE TO FIND CPAP from home while sleeping, at previous home settings    [provider]   Allergies  Allergen Reactions  . Zocor [Simvastatin] Other (See Comments)    fatigue  . Lipitor [Atorvastatin] Other (See Comments)    Muscle cramping   Review of Systems  Constitutional: Positive for activity change, fatigue and unexpected weight change.  Respiratory: Positive for shortness of breath.    Physical Exam Constitutional:      Appearance: He is ill-appearing.  HENT:     Head: Normocephalic.     Nose: Nose normal.     Mouth/Throat:     Mouth: Mucous membranes are dry.  Eyes:     Pupils: Pupils are equal, round, and reactive to light.  Cardiovascular:     Rate and Rhythm: Regular rhythm.  Pulmonary:     Effort: Respiratory distress present.  Abdominal:     General: There is distension.  Musculoskeletal:        General: Swelling present.     Cervical back: Normal range of motion.  Skin:    Capillary Refill: Capillary refill takes  less than 2 seconds.  Neurological:     General: No focal deficit present.     Mental Status: He is alert.  Psychiatric:        Mood and Affect: Mood normal.     Vital Signs: BP 137/71 (BP Location: Right Arm)   Pulse 75   Temp 99 F (37.2 C) (Oral)   Resp 20   Ht 5\' 5"  (1.651 m)   Wt 123.9 kg   SpO2 94%   BMI 45.45 kg/m  Pain Scale: 0-10   Pain Score: Asleep  SpO2: SpO2: 94 % O2 Device:SpO2: 94 % O2 Flow Rate: .O2 Flow Rate (L/min): 15 L/min  IO: Intake/output summary:   Intake/Output Summary (Last 24 hours) at 04/04/2019 0738 Last data filed at 04/04/2019 0319 Gross per 24 hour  Intake 786.77 ml  Output 650 ml  Net 136.77 ml    LBM: Last BM Date:  04/03/19 Baseline Weight: Weight: 117.9 kg Most recent weight: Weight: 123.9 kg     Palliative Assessment/Data: 50%  Time In: 0800 Time Out: 0915 Time Total: 75 Greater than 50%  of this time was spent counseling and coordinating care related to the above assessment and plan.  [facility] Team Team Cell Phone: [phone] Please utilize secure chat with additional questions, if there is no response within 30 minutes please call the above phone number  Palliative Medicine Team providers are available by phone from 7am to 7pm daily and can be reached through the team cell phone.  Should this patient require assistance outside of these hours, please call the patient's attending physician.  Vinie Sill, NP Palliative Medicine Team Pager [phone] (Please see [email or web address] for schedule) Team Phone [phone]   Reviewed note and discussed with Tacey Ruiz, NP.

## 2019-04-04 NOTE — Progress Notes (Addendum)
Pt BP 111/67 with IV diuril, IV lasix, and PO metoprolol due at 2200.  Paged TRH to advise on which to give first in case BP drops.   NP suggested to give diuril first, wait an hour and give metoprolol, and then if BP still tolerable iv lasix.

## 2019-04-04 NOTE — Significant Event (Addendum)
Rapid Response Event Note  Overview:Asked to evaluate pt as "second set of eyes" d/t lethargy. Per RN, pt VS and CBG are WNL. NP already notified of pt lethargy. RT to place pt back on bipap as pt has been wearing it at night.  Time Called: 2130 Arrival Time: 2300 Event Type: Neurologic  Initial Focused Assessment: Pt laying in bed with eyes closed, appears to be in no distress. Pt alert to self and place, very irritable, says he is just tired and wants to sleep. When asked to squeeze hands, pt originally said "no."  Later on in assessment,  pt squeezed hands and wiggled toes equally.   Interventions: Bipap (done prior to my arrival) Plan of Care (if not transferred): Contine bipap, pt is DNR so ABG wouldn't change plan of care at this time. Continue to monitor pt. Call RRT if further assistance needed.  Event Summary: Name of Physician Notified: Kennon Holter, NP at (PTA RRT)    at    Outcome: Stayed in room and stabalized  Event End Time: 2305  [place]

## 2019-04-04 NOTE — Care Management Important Message (Signed)
Important Message  Patient Details  Name: [name] MRN: [id number] Date of Birth: [birth year]   Medicare Important Message Given:  Yes     [name] 04/04/2019, 1:35 PM

## 2019-04-05 DIAGNOSIS — E871 Hypo-osmolality and hyponatremia: Secondary | ICD-10-CM

## 2019-04-05 DIAGNOSIS — Z9989 Dependence on other enabling machines and devices: Secondary | ICD-10-CM

## 2019-04-05 DIAGNOSIS — I25810 Atherosclerosis of coronary artery bypass graft(s) without angina pectoris: Secondary | ICD-10-CM

## 2019-04-05 DIAGNOSIS — J9601 Acute respiratory failure with hypoxia: Secondary | ICD-10-CM

## 2019-04-05 DIAGNOSIS — N179 Acute kidney failure, unspecified: Secondary | ICD-10-CM

## 2019-04-05 DIAGNOSIS — R778 Other specified abnormalities of plasma proteins: Secondary | ICD-10-CM

## 2019-04-05 DIAGNOSIS — Z794 Long term (current) use of insulin: Secondary | ICD-10-CM

## 2019-04-05 DIAGNOSIS — Z515 Encounter for palliative care: Secondary | ICD-10-CM

## 2019-04-05 DIAGNOSIS — I5042 Chronic combined systolic (congestive) and diastolic (congestive) heart failure: Secondary | ICD-10-CM

## 2019-04-05 DIAGNOSIS — E1121 Type 2 diabetes mellitus with diabetic nephropathy: Secondary | ICD-10-CM

## 2019-04-05 LAB — RENAL FUNCTION PANEL
Albumin: 2.8 g/dL — ABNORMAL LOW (ref 3.5–5.0)
Anion gap: 11 (ref 5–15)
BUN: 89 mg/dL — ABNORMAL HIGH (ref 8–23)
CO2: 26 mmol/L (ref 22–32)
Calcium: 8.2 mg/dL — ABNORMAL LOW (ref 8.9–10.3)
Chloride: 96 mmol/L — ABNORMAL LOW (ref 98–111)
Creatinine, Ser: 3.49 mg/dL — ABNORMAL HIGH (ref 0.61–1.24)
GFR calc Af Amer: 18 mL/min — ABNORMAL LOW (ref >60)
GFR calc non Af Amer: 15 mL/min — ABNORMAL LOW (ref >60)
Glucose, Bld: 80 mg/dL (ref 70–99)
Phosphorus: 5 mg/dL — ABNORMAL HIGH (ref 2.5–4.6)
Potassium: 4.4 mmol/L (ref 3.5–5.1)
Sodium: 133 mmol/L — ABNORMAL LOW (ref 135–145)

## 2019-04-05 LAB — GLUCOSE, CAPILLARY
Glucose-Capillary: 92 mg/dL (ref 70–99)
Glucose-Capillary: 154 mg/dL — ABNORMAL HIGH (ref 70–99)

## 2019-04-05 LAB — MAGNESIUM: Magnesium: 2 mg/dL (ref 1.7–2.4)

## 2019-04-05 MED ORDER — GLYCOPYRROLATE 1 MG PO TABS: 1.0000 mg | ORAL_TABLET | Freq: Three times a day (TID) | ORAL | 0 refills | Status: AC | PRN

## 2019-04-05 MED ORDER — MORPHINE SULFATE (CONCENTRATE) 10 MG /0.5 ML PO SOLN: 10.0000 mg | ORAL | 0 refills | Status: AC | PRN

## 2019-04-05 MED ORDER — LORAZEPAM 0.5 MG PO TABS: 0.5000 mg | ORAL_TABLET | ORAL | 0 refills | Status: AC | PRN

## 2019-04-05 MED ORDER — MORPHINE SULFATE (PF) 2 MG/ML IV SOLN
2.0000 mg | INTRAVENOUS | Status: DC | PRN
  Administered 2019-04-05: 13:00:00 2 mg via INTRAVENOUS
  Filled 2019-04-05: qty 1

## 2019-04-06 MED FILL — Medication: Qty: 1 | Status: AC

## 2019-04-14 NOTE — Discharge Summary (Signed)
Physician Discharge Summary  MANNIX HARKINS [id number] DOB: Jan 22, 1934 DOA: 04/03/2019  PCP: Rosalee Kaufman, PA-C  Admit date: 04/06/2019 Discharge date: 04-23-19  Admitted From: Home Disposition: Home with home hospice  Home Health: Home hospice  Discharge Condition: Comfort measures.  Guarded prognosis. CODE STATUS: DNR/DNI  Hospital Course: 86 y.o.yo malewith history of HTN, DM, HLD, morbid obesity, OSA on CPAP, CAD/CABG in 1994, COPD on CPAP and oxygen, chronic RF on 2 L, [id number], systolic CHF with EF of 30 to 35% [facility] with shortness of breath and admitted with acute CHF exacerbation.  Transferred to [facility] due to worsening EF with new RWMA and elevated troponin for cardiology evaluation.  Evaluated by cardiology who recommended medical management given worsening renal function.  Nephrology consulted  for renal failure.  Renal failure thought to be cardiorenal syndrome.  Patient was started on high-dose loop diuretics and thiazide diuretics by nephrology with minimal improvement in his fluid status.  Patient and family not interested in hemodialysis.  Given his significant comorbidities, tenuous respiratory status, deteriorating renal function and very guarded prognosis, palliative care consulted after discussion with patient's family and HC POA.  After extensive discussion about patient's conditions and treatment options with palliative care and experts involved, patient and family chose to go home with home hospice on comfort measures only.  See individual problems below for more hospital course.  Discharge Diagnoses:  Acute on chronic respiratory failure with hypoxia: Multifactorial including acute CHF, non-STEMI, underlying COPD, OSA and possible OHS.  On BiPAP.  Acute on chronic combined CHF: Echo on 12/18 with EF of 30 to 35% (40 to 45% previously), RWMA, G2 DD and RVSP to 46. Remains fluid overloaded and BiPAP dependent despite high-dose  diuretics.  Non-STEMI/inferolateral STEMI: High-sensitivity troponin peaked at 3000.  Unfortunately, not an ideal candidate for cardiac due to renal failure.  AKI on CKD-3a/azotemia: creatinine plateauing.  Azotemia worse.  Patient and family not interested in hemodialysis.  Hyponatremia: Likely due to renal failure.  Stable.  Uncontrolled diabetes with renal failure:: A1c 7.4%.  Chronic COPD: Stable.  Does not seem to be on medication for this at home.  OSA on CPAP: On CPAP at home.  Currently on BiPAP and HFNC..  Morbid obesity: BMI 45  BPH with LUTS: Urine culture negative.  GERD  Goal of care discussion/DNR/DNI/comfort measures only -Patient to be discharged on Roxanol, Ativan and glycopyrrolate. -Has oxygen and CPAP at home.  Disposition: Home with home hospice.  May have to be transported on nonrebreather.  Family communication: Discussed with Lucio Edward, son and HCPOA at the bedside on the day of discharge. Discharge Instructions   Allergies as of 2019-04-23      Reactions   Zocor [simvastatin] Other (See Comments)   fatigue   Lipitor [atorvastatin] Other (See Comments)   Muscle cramping      Medication List    STOP taking these medications   acetaminophen 325 MG tablet Commonly known as: TYLENOL   allopurinol 100 MG tablet Commonly known as: ZYLOPRIM   alprazolam 2 MG tablet Commonly known as: XANAX   aspirin EC 81 MG tablet   clopidogrel 75 MG tablet Commonly known as: PLAVIX   escitalopram 5 MG tablet Commonly known as: LEXAPRO   hydrALAZINE 10 MG tablet Commonly known as: APRESOLINE   insulin aspart 100 UNIT/ML injection Commonly known as: novoLOG   Insulin Glargine (2 Unit Dial) 300 UNIT/ML Sopn Commonly known as: Toujeo Max SoloStar   isosorbide mononitrate  120 MG 24 hr tablet Commonly known as: IMDUR   magnesium oxide 400 (241.3 Mg) MG tablet Commonly known as: MAG-OX   metoprolol tartrate 50 MG tablet Commonly known  as: LOPRESSOR   nitroGLYCERIN 0.4 MG/SPRAY spray Commonly known as: NITROLINGUAL   OXYGEN   pantoprazole 40 MG tablet Commonly known as: PROTONIX   psyllium 58.6 % powder Commonly known as: METAMUCIL   tamsulosin 0.4 MG Caps capsule Commonly known as: FLOMAX   torsemide 20 MG tablet Commonly known as: DEMADEX   Trulicity [id number] Sopn Generic drug: Dulaglutide   UNABLE TO FIND     TAKE these medications   glycopyrrolate 1 MG tablet Commonly known as: ROBINUL Take 1 tablet (1 mg total) by mouth 3 (three) times daily as needed (salivation).   LORazepam 0.5 MG tablet Commonly known as: Ativan Take 1 tablet (0.5 mg total) by mouth every 4 (four) hours as needed for up to 10 days for anxiety.   morphine CONCENTRATE 10 mg / 0.5 ml concentrated solution Take 0.5 mLs (10 mg total) by mouth every 3 (three) hours as needed for moderate pain, severe pain or shortness of breath.       Consultations:  Cardiology, nephrology and palliative care  Procedures/Studies:  2D Echo on 03/27/2019: LV EF of 30 to 35% (40 to 45% previously), RWMA, G2 DD and RVSP to 46   US RENAL  Result Date: 04/02/2019 CLINICAL DATA:  Acute renal insufficiency EXAM: RENAL / URINARY TRACT ULTRASOUND COMPLETE COMPARISON:  None. FINDINGS: Right Kidney: Renal measurements: 10.2 x 4.6 x 6.7 cm = volume: 164.1 mL. Nonobstructive 9 mm stone. Left Kidney: Renal measurements: 14.8 x 7.0 x 6.8 cm = volume: 369.6 mL. A small amount of perinephric fluid is seen. Bladder: Not visualized. Other: Right pleural effusion. IMPRESSION: 1. No hydronephrosis. Nonobstructive 9 mm stone in the right kidney. 2. A small amount of left perinephric fluid is seen, nonspecific. 3. Right pleural effusion. Electronically Signed   By: Dorise Bullion III M.D   On: 04/02/2019 17:23   DG Chest Port 1 View  Result Date: 04/04/2019 CLINICAL DATA:  86 year old male with shortness of breath. EXAM: PORTABLE CHEST 1 VIEW COMPARISON:   Chest radiograph dated 04/04/2019. FINDINGS: There is complete opacification of the left hemithorax which may represent interval development of a large left pleural effusion and associated atelectasis. The right lung base density also likely combination of pleural effusion and associated atelectasis. No pneumothorax. The cardiac borders are silhouetted. Median sternotomy wires noted. No acute osseous pathology. IMPRESSION: Complete opacification of the left hemithorax likely representing a large left pleural effusion and associated atelectasis. Electronically Signed   By: Anner Crete M.D.   On: 04/04/2019 22:09   DG Chest Port 1 View  Result Date: 03/16/2019 CLINICAL DATA:  Dyspnea, shortness of breath EXAM: PORTABLE CHEST 1 VIEW COMPARISON:  12/18/2018 FINDINGS: Bilateral interstitial and alveolar airspace opacities. Bilateral small pleural effusions. No pneumothorax. Stable cardiomegaly. Prior CABG. No acute osseous abnormality. IMPRESSION: Bilateral interstitial and alveolar airspace opacities. Differential considerations include pulmonary edema versus multilobar pneumonia. Electronically Signed   By: Kathreen Devoid   On: 03/26/2019 10:13   ECHOCARDIOGRAM COMPLETE  Result Date: 03/18/2019   ECHOCARDIOGRAM REPORT   Patient Name:   GAVYNN POLE Date of Exam: 03/16/2019 Medical Rec #:  [id number]        Height:       65.0 in Accession #:    [id number]       Weight:  252.0 lb Date of Birth:  May 05, 1933        BSA:          2.18 m Patient Age:    71 years         BP:           136/77 mmHg Patient Gender: M                HR:           91 bpm. Exam Location:  [facility] Procedure: 2D Echo, Cardiac Doppler and Color Doppler Indications:    Dyspnea 786.09 / R06.00  History:        Patient has prior history of Echocardiogram examinations, most                 recent 07/21/2018. CAD and Previous Myocardial Infarction; Risk                 Factors:Hypertension, Diabetes and Dyslipidemia. Acute on                  chronic diastolic heart failure, Sleep apnea.  Sonographer:    Alvino Chapel RCS Referring Phys: [phone] [facility]  Sonographer Comments: In airborne precautions FINDINGS  Left Ventricle: Left ventricular ejection fraction, by visual estimation, is 30 to 35%. The left ventricle has moderate to severely decreased function. Definity contrast agent was given IV to delineate the left ventricular endocardial borders. The left ventricle demonstrates regional wall motion abnormalities. The left ventricular internal cavity size was the left ventricle is normal in size. There is moderately increased left ventricular hypertrophy. Left ventricular diastolic parameters are consistent with Grade II diastolic dysfunction (pseudonormalization).  LV Wall Scoring: The apical septal segment, mid inferolateral segment, apical inferior segment, and apex are akinetic. The entire anterior wall, basal and mid anterolateral wall, basal and mid anterior septum, basal and mid inferior septum, basal and mid inferior wall, basal inferolateral segment, and apical lateral segment are hypokinetic. Right Ventricle: The right ventricular size is normal. No increase in right ventricular wall thickness. Global RV systolic function is has normal systolic function. The tricuspid regurgitant velocity is 2.80 m/s, and with an assumed right atrial pressure  of 15 mmHg, the estimated right ventricular systolic pressure is moderately elevated at 46.4 mmHg. Left Atrium: Left atrial size was mild-moderately dilated. Right Atrium: Right atrial size was normal in size Pericardium: There is no evidence of pericardial effusion. Mitral Valve: The mitral valve is degenerative in appearance. There is mild calcification of the mitral valve leaflet(s). Mild mitral annular calcification. Trivial mitral valve regurgitation. Tricuspid Valve: The tricuspid valve is grossly normal. Tricuspid valve regurgitation is trivial. Aortic Valve: The aortic valve is  tricuspid. Aortic valve regurgitation is not visualized. Mild to moderate aortic valve annular calcification. Pulmonic Valve: The pulmonic valve was grossly normal. Pulmonic valve regurgitation is trivial. Pulmonic regurgitation is trivial. Aorta: The aortic root is normal in size and structure. Venous: The inferior vena cava is dilated in size with less than 50% respiratory variability, suggesting right atrial pressure of 15 mmHg. IAS/Shunts: No atrial level shunt detected by color flow Doppler.  LEFT VENTRICLE PLAX 2D LVIDd:         5.01 cm LVIDs:         3.95 cm LV PW:         1.42 cm LV IVS:        1.44 cm LVOT diam:     2.10 cm LV SV:  51 ml LV SV Index:   21.65 LVOT Area:     3.46 cm  LEFT ATRIUM           Index LA diam:      4.50 cm 2.06 cm/m LA Vol (A2C): 91.0 ml 41.70 ml/m   AORTA Ao Root diam: 3.40 cm MV E velocity: 102.00 cm/s  103 cm/s  TRICUSPID VALVE MV A velocity: 7570.00 cm/s 70.3 cm/s TV Peak grad:   313600.0 mmHg MV E/A ratio:  0.01         1.5       TV Vmax:        280.00 m/s                                       TR Peak grad:   31.4 mmHg                                       TR Vmax:        280.00 cm/s                                        SHUNTS                                       Systemic Diam: 2.10 cm  [name] MD Electronically signed by [name] MD Signature Date/Time: 04/04/2019/3:51:02 PM    Final       Discharge Exam: Vitals:   22-Apr-2019 0759 April 22, 2019 1110  BP: 128/60 (!) 104/48  Pulse: 80 74  Resp: 16 19  Temp: 99.2 F (37.3 C) 97.9 F (36.6 C)  SpO2: 96% 96%   GENERAL: No apparent distress.  Morbidly obese. HEENT: MMM.  Vision and hearing grossly intact.  NECK: Supple.  Difficult to assess JVD due to body habitus. RESP: On HFNC.  Fair aeration bilaterally.  Bibasilar rales.  Likes to go back on BiPAP. CVS:  RRR. Heart sounds normal.  ABD/GI/GU: Bowel sounds present. Soft. Non tender.  MSK/EXT:  Moves extremities. No apparent deformity.  2+  pitting edema bilaterally. SKIN: no apparent skin lesion or wound NEURO: Awake, alert and oriented appropriately.  No apparent focal neuro deficit. PSYCH: Calm. Normal affect.  The results of significant diagnostics from this hospitalization (including imaging, microbiology, ancillary and laboratory) are listed below for reference.     Microbiology: Recent Results (from the past 240 hour(s))  Urine culture     Status: None   Collection Time: 03/25/2019  9:34 AM   Specimen: Urine, Catheterized  Result Value Ref Range Status   Specimen Description   Final    URINE, CATHETERIZED Performed at [facility], [address] [place], [place] [address]    Special Requests   Final    NONE Performed at [facility], [address], [place], [place] [address]    Culture   Final    NO GROWTH Performed at [facility], [place] [address] [place], [place] [address]    Report Status 04/01/2019 FINAL  Final  SARS CORONAVIRUS 2 (TAT 6-24 HRS) Nasopharyngeal Nasopharyngeal Swab     Status:  None   Collection Time: 04/07/2019 11:16 AM   Specimen: Nasopharyngeal Swab  Result Value Ref Range Status   SARS Coronavirus 2 NEGATIVE NEGATIVE Final    Comment: (NOTE) SARS-CoV-2 target nucleic acids are NOT DETECTED. The SARS-CoV-2 RNA is generally detectable in upper and lower respiratory specimens during the acute phase of infection. Negative results do not preclude SARS-CoV-2 infection, do not rule out co-infections with other pathogens, and should not be used as the sole basis for treatment or other patient management decisions. Negative results must be combined with clinical observations, patient history, and epidemiological information. The expected result is Negative. Fact Sheet for Patients: SugarRoll.be Fact Sheet for Healthcare Providers: https://www.woods-mathews.com/ This test is not yet approved or cleared by the [place] FDA and  has been  authorized for detection and/or diagnosis of SARS-CoV-2 by FDA under an Emergency Use Authorization (EUA). This EUA will remain  in effect (meaning this test can be used) for the duration of the COVID-19 declaration under Section 56 4(b)(1) of the Act, 21 U.S.C. section 360bbb-3(b)(1), unless the authorization is terminated or revoked sooner. Performed at [facility], [place] [address], [place], [place] [address]   Respiratory Panel by RT PCR (Flu A&B, Covid) - Nasopharyngeal Swab     Status: None   Collection Time: 03/19/2019  8:16 PM   Specimen: Nasopharyngeal Swab  Result Value Ref Range Status   SARS Coronavirus 2 by RT PCR NEGATIVE NEGATIVE Final    Comment: (NOTE) SARS-CoV-2 target nucleic acids are NOT DETECTED. The SARS-CoV-2 RNA is generally detectable in upper respiratoy specimens during the acute phase of infection. The lowest concentration of SARS-CoV-2 viral copies this assay can detect is 131 copies/mL. A negative result does not preclude SARS-Cov-2 infection and should not be used as the sole basis for treatment or other patient management decisions. A negative result may occur with  improper specimen collection/handling, submission of specimen other than nasopharyngeal swab, presence of viral mutation(s) within the areas targeted by this assay, and inadequate number of viral copies (<131 copies/mL). A negative result must be combined with clinical observations, patient history, and epidemiological information. The expected result is Negative. Fact Sheet for Patients:  PinkCheek.be Fact Sheet for Healthcare Providers:  GravelBags.it This test is not yet ap proved or cleared by the [place] FDA and  has been authorized for detection and/or diagnosis of SARS-CoV-2 by FDA under an Emergency Use Authorization (EUA). This EUA will remain  in effect (meaning this test can be used) for the duration of  the COVID-19 declaration under Section 564(b)(1) of the Act, 21 U.S.C. section 360bbb-3(b)(1), unless the authorization is terminated or revoked sooner.    Influenza A by PCR NEGATIVE NEGATIVE Final   Influenza B by PCR NEGATIVE NEGATIVE Final    Comment: (NOTE) The Xpert Xpress SARS-CoV-2/FLU/RSV assay is intended as an aid in  the diagnosis of influenza from Nasopharyngeal swab specimens and  should not be used as a sole basis for treatment. Nasal washings and  aspirates are unacceptable for Xpert Xpress SARS-CoV-2/FLU/RSV  testing. Fact Sheet for Patients: PinkCheek.be Fact Sheet for Healthcare Providers: GravelBags.it This test is not yet approved or cleared by the [place] FDA and  has been authorized for detection and/or diagnosis of SARS-CoV-2 by  FDA under an Emergency Use Authorization (EUA). This EUA will remain  in effect (meaning this test can be used) for the duration of the  Covid-19 declaration under Section 564(b)(1) of the Act, 21  U.S.C.  section 360bbb-3(b)(1), unless the authorization is  terminated or revoked. Performed at [facility], [address], [place], [place] [address]   MRSA PCR Screening     Status: Abnormal   Collection Time: 04/02/19  8:54 PM   Specimen: Nasopharyngeal  Result Value Ref Range Status   MRSA by PCR POSITIVE (A) NEGATIVE Final    Comment: CRITICAL RESULT CALLED TO, READ BACK BY AND VERIFIED WITH: RN, Arvella Nigh [id number] @2322  THANEY Performed at [facility], [address], [place], [place] [address]      Labs: BNP (last 3 results) Recent Labs    07/21/18 0835 10/22/18 1125 04/12/2019 0935  BNP 126.0* 83.0 [id number]*   Basic Metabolic Panel: Recent Labs  Lab 04/01/19 0630 04/02/19 0441 04/03/19 0820 04/04/19 0451 05-02-19 0403  NA 138 134* 134* 133* 133*  K 5.5* 5.5* 4.5 4.8 4.4  CL 103 98 101 97* 96*  CO2 18* 24 24 23 26   GLUCOSE 182* 128* 107* 99 80   BUN 48* 59* 70* 78* 89*  CREATININE 2.63* 3.46* 3.53* 3.53* 3.49*  CALCIUM 8.6* 8.6* 8.2* 8.4* 8.2*  MG  --   --  1.9 2.1 2.0  PHOS  --   --  4.8* 4.8* 5.0*   Liver Function Tests: Recent Labs  Lab 04/12/2019 0935 04/03/19 0820 04/04/19 0451 May 02, 2019 0403  AST 26  --   --   --   ALT 29  --   --   --   ALKPHOS 61  --   --   --   BILITOT 0.6  --   --   --   PROT 6.9  --   --   --   ALBUMIN 3.6 3.0* 3.1* 2.8*   Recent Labs  Lab 03/17/2019 0935  LIPASE 28   No results for input(s): AMMONIA in the last 168 hours. CBC: Recent Labs  Lab 03/19/2019 0935 04/01/19 0630 04/02/19 0441 04/03/19 0525 04/04/19 0451  WBC 10.5 7.1 10.0 8.9 8.3  NEUTROABS 8.0*  --  8.1*  --   --   HGB 11.7* 11.1* 11.5* 10.8* 11.2*  HCT 38.3* 38.6* 37.5* 35.4* 36.0*  MCV 93.9 99.2 93.8 93.7 91.4  PLT 193 159 203 165 186   Cardiac Enzymes: No results for input(s): CKTOTAL, CKMB, CKMBINDEX, TROPONINI in the last 168 hours. BNP: Invalid input(s): POCBNP CBG: Recent Labs  Lab 04/04/19 1106 04/04/19 1638 04/04/19 2108 05-02-19 0634 05/02/2019 1105  GLUCAP 124* 128* 119* 92 154*   D-Dimer No results for input(s): DDIMER in the last 72 hours. Hgb A1c No results for input(s): HGBA1C in the last 72 hours. Lipid Profile No results for input(s): CHOL, HDL, LDLCALC, TRIG, CHOLHDL, LDLDIRECT in the last 72 hours. Thyroid function studies No results for input(s): TSH, T4TOTAL, T3FREE, THYROIDAB in the last 72 hours.  Invalid input(s): FREET3 Anemia work up No results for input(s): VITAMINB12, FOLATE, FERRITIN, TIBC, IRON, RETICCTPCT in the last 72 hours. Urinalysis    Component Value Date/Time   COLORURINE YELLOW 04/02/2019 1701   APPEARANCEUR CLOUDY (A) 04/02/2019 1701   LABSPEC 1.013 04/02/2019 1701   PHURINE 5.0 04/02/2019 1701   GLUCOSEU NEGATIVE 04/02/2019 1701   HGBUR LARGE (A) 04/02/2019 1701   BILIRUBINUR NEGATIVE 04/02/2019 1701   KETONESUR NEGATIVE 04/02/2019 1701   PROTEINUR 30 (A)  04/02/2019 1701   NITRITE NEGATIVE 04/02/2019 1701   LEUKOCYTESUR MODERATE (A) 04/02/2019 1701   Sepsis Labs Invalid input(s): PROCALCITONIN,  WBC,  LACTICIDVEN   Time  coordinating discharge: 45 minutes  SIGNED:  Mercy Riding, MD  [facility] 04-May-2019, 1:19 PM  If 7PM-7AM, please contact night-coverage [email or web address] Password [email or web address]

## 2019-04-14 NOTE — Code Documentation (Signed)
Code Blue  Responded to [employer]. Patient was being assisted with breaths via BVM. Code Status was DNR. Patient's son and granddaughter in law at bedside. On Zoll monitor, patient was in VF. IMTS MD at bedside, spoke with son, per son - DNR was confirmed. Patient was placed on nasal cannula and died with family present.

## 2019-04-14 NOTE — Death Summary Note (Signed)
DEATH SUMMARY   Patient Details  Name: [name] MRN: [id number] DOB: 02-18-1934  Admission/Discharge Information   Admit Date:  April 13, 2019  Date of Death: Date of Death: 2019-04-18  Time of Death: Time of Death: Jul 19, 1655  Length of Stay: 5  Referring Physician: Rosalee Kaufman, PA-C   Reason(s) for Hospitalization  shortness of breath due to acute CHF exacerbation  Diagnoses  Preliminary cause of death: Ventricular fibrillation ([employer]) Secondary Diagnoses (including complications and co-morbidities):  Acute on chronic combined CHF Acute coronary syndrome/non-STEMI/inferolateral STEMI Acute on chronic renal failure on underlying CKD-3A Fluid overload due to heart failure and renal failure Acute on chronic respiratory failure with hypoxia Uncontrolled diabetes Chronic COPD OSA on CPAP Morbid obesity Essential hypertension Hyponatremia DNR/DNI Comfort measures only  Brief Hospital Course (including significant findings, care, treatment, and services provided and events leading to death)  [name] is a 86 y.o. year old male with history of HTN, DM, HLD, morbid obesity, OSA on CPAP, CAD/CABG in July 18, 1992, COPD on CPAP and oxygen, chronic RF on 2 L, [id number], systolic CHF with EF of 30 to 35% [facility] with [facility]ness of breath and admitted with acute CHF exacerbation. Transferred to [facility] due to worsening EF with new RWMA and elevated troponin for cardiology evaluation.  Evaluated by cardiology who recommended medical management given worsening renal function.  Nephrology consulted for renal failure.  Renal failure thought to be cardiorenal syndrome. Patient was started on high-dose loop diuretics and thiazide diuretics by nephrology with minimal improvement in his fluid status.  Patient and family not interested in hemodialysis.  Given his significant comorbidities, tenuous respiratory status, deteriorating renal function and very guarded prognosis, palliative  care consulted after discussion with patient's family and HC POA.  After extensive discussion about patient's conditions and treatment options with palliative care and experts involved, patient and family chose to go home with home hospice on comfort measures only. However, patient went into ventricular fibrillation and passed away. Patient's son and granddaughter-in-law at bedside.   Pertinent Labs and Studies  Significant Diagnostic Studies US RENAL  Result Date: 04/02/2019 CLINICAL DATA:  Acute renal insufficiency EXAM: RENAL / URINARY TRACT ULTRASOUND COMPLETE COMPARISON:  None. FINDINGS: Right Kidney: Renal measurements: 10.2 x 4.6 x 6.7 cm = volume: 164.1 mL. Nonobstructive 9 mm stone. Left Kidney: Renal measurements: 14.8 x 7.0 x 6.8 cm = volume: 369.6 mL. A small amount of perinephric fluid is seen. Bladder: Not visualized. Other: Right pleural effusion. IMPRESSION: 1. No hydronephrosis. Nonobstructive 9 mm stone in the right kidney. 2. A small amount of left perinephric fluid is seen, nonspecific. 3. Right pleural effusion. Electronically Signed   By: Dorise Bullion III M.D   On: 04/02/2019 17:23   DG Chest Port 1 View  Result Date: 04/04/2019 CLINICAL DATA:  86 year old male with shortness of breath. EXAM: PORTABLE CHEST 1 VIEW COMPARISON:  Chest radiograph dated April 13, 2019. FINDINGS: There is complete opacification of the left hemithorax which may represent interval development of a large left pleural effusion and associated atelectasis. The right lung base density also likely combination of pleural effusion and associated atelectasis. No pneumothorax. The cardiac borders are silhouetted. Median sternotomy wires noted. No acute osseous pathology. IMPRESSION: Complete opacification of the left hemithorax likely representing a large left pleural effusion and associated atelectasis. Electronically Signed   By: Anner Crete M.D.   On: 04/04/2019 22:09   DG Chest Port 1 View  Result  Date: 2019/04/13 CLINICAL DATA:  Dyspnea,  shortness of breath EXAM: PORTABLE CHEST 1 VIEW COMPARISON:  12/18/2018 FINDINGS: Bilateral interstitial and alveolar airspace opacities. Bilateral small pleural effusions. No pneumothorax. Stable cardiomegaly. Prior CABG. No acute osseous abnormality. IMPRESSION: Bilateral interstitial and alveolar airspace opacities. Differential considerations include pulmonary edema versus multilobar pneumonia. Electronically Signed   By: Kathreen Devoid   On: 03/25/2019 10:13   ECHOCARDIOGRAM COMPLETE  Result Date: 04/07/2019   ECHOCARDIOGRAM REPORT   Patient Name:   [name] Date of Exam: 03/24/2019 Medical Rec #:  [id number]        Height:       65.0 in Accession #:    [id number]       Weight:       252.0 lb Date of Birth:  [birth year]        BSA:          2.18 m Patient Age:    86 years         BP:           136/77 mmHg Patient Gender: M                HR:           91 bpm. Exam Location:  [facility] Procedure: 2D Echo, Cardiac Doppler and Color Doppler Indications:    Dyspnea 786.09 / R06.00  History:        Patient has prior history of Echocardiogram examinations, most                 recent 07/21/2018. CAD and Previous Myocardial Infarction; Risk                 Factors:Hypertension, Diabetes and Dyslipidemia. Acute on                 chronic diastolic heart failure, Sleep apnea.  Sonographer:    Alvino Chapel RCS Referring Phys: [phone] [facility]  Sonographer Comments: In airborne precautions FINDINGS  Left Ventricle: Left ventricular ejection fraction, by visual estimation, is 30 to 35%. The left ventricle has moderate to severely decreased function. Definity contrast agent was given IV to delineate the left ventricular endocardial borders. The left ventricle demonstrates regional wall motion abnormalities. The left ventricular internal cavity size was the left ventricle is normal in size. There is moderately increased left ventricular hypertrophy. Left ventricular  diastolic parameters are consistent with Grade II diastolic dysfunction (pseudonormalization).  LV Wall Scoring: The apical septal segment, mid inferolateral segment, apical inferior segment, and apex are akinetic. The entire anterior wall, basal and mid anterolateral wall, basal and mid anterior septum, basal and mid inferior septum, basal and mid inferior wall, basal inferolateral segment, and apical lateral segment are hypokinetic. Right Ventricle: The right ventricular size is normal. No increase in right ventricular wall thickness. Global RV systolic function is has normal systolic function. The tricuspid regurgitant velocity is 2.80 m/s, and with an assumed right atrial pressure  of 15 mmHg, the estimated right ventricular systolic pressure is moderately elevated at 46.4 mmHg. Left Atrium: Left atrial size was mild-moderately dilated. Right Atrium: Right atrial size was normal in size Pericardium: There is no evidence of pericardial effusion. Mitral Valve: The mitral valve is degenerative in appearance. There is mild calcification of the mitral valve leaflet(s). Mild mitral annular calcification. Trivial mitral valve regurgitation. Tricuspid Valve: The tricuspid valve is grossly normal. Tricuspid valve regurgitation is trivial. Aortic Valve: The aortic valve is tricuspid. Aortic valve regurgitation is not visualized. Mild to moderate aortic  valve annular calcification. Pulmonic Valve: The pulmonic valve was grossly normal. Pulmonic valve regurgitation is trivial. Pulmonic regurgitation is trivial. Aorta: The aortic root is normal in size and structure. Venous: The inferior vena cava is dilated in size with less than 50% respiratory variability, suggesting right atrial pressure of 15 mmHg. IAS/Shunts: No atrial level shunt detected by color flow Doppler.  LEFT VENTRICLE PLAX 2D LVIDd:         5.01 cm LVIDs:         3.95 cm LV PW:         1.42 cm LV IVS:        1.44 cm LVOT diam:     2.10 cm LV SV:         51 ml  LV SV Index:   21.65 LVOT Area:     3.46 cm  LEFT ATRIUM           Index LA diam:      4.50 cm 2.06 cm/m LA Vol (A2C): 91.0 ml 41.70 ml/m   AORTA Ao Root diam: 3.40 cm MV E velocity: 102.00 cm/s  103 cm/s  TRICUSPID VALVE MV A velocity: 7570.00 cm/s 70.3 cm/s TV Peak grad:   313600.0 mmHg MV E/A ratio:  0.01         1.5       TV Vmax:        280.00 m/s                                       TR Peak grad:   31.4 mmHg                                       TR Vmax:        280.00 cm/s                                        SHUNTS                                       Systemic Diam: 2.10 cm  [name] MD Electronically signed by [name] MD Signature Date/Time: 03/29/2019/3:51:02 PM    Final     Microbiology Recent Results (from the past 240 hour(s))  Urine culture     Status: None   Collection Time: 03/26/2019  9:34 AM   Specimen: Urine, Catheterized  Result Value Ref Range Status   Specimen Description   Final    URINE, CATHETERIZED Performed at [facility], [address], [place], [place] [address]    Special Requests   Final    NONE Performed at [facility], [address] [place], [place] [address]    Culture   Final    NO GROWTH Performed at [facility], [place] [address], [place], [place] [address]    Report Status 04/01/2019 FINAL  Final  SARS CORONAVIRUS 2 (TAT 6-24 HRS) Nasopharyngeal Nasopharyngeal Swab     Status: None   Collection Time: 03/24/2019 11:16 AM   Specimen: Nasopharyngeal Swab  Result Value Ref Range Status   SARS Coronavirus 2 NEGATIVE NEGATIVE Final  Comment: (NOTE) SARS-CoV-2 target nucleic acids are NOT DETECTED. The SARS-CoV-2 RNA is generally detectable in upper and lower respiratory specimens during the acute phase of infection. Negative results do not preclude SARS-CoV-2 infection, do not rule out co-infections with other pathogens, and should not be used as the sole basis for treatment or other patient management decisions. Negative  results must be combined with clinical observations, patient history, and epidemiological information. The expected result is Negative. Fact Sheet for Patients: SugarRoll.be Fact Sheet for Healthcare Providers: https://www.woods-mathews.com/ This test is not yet approved or cleared by the [place] FDA and  has been authorized for detection and/or diagnosis of SARS-CoV-2 by FDA under an Emergency Use Authorization (EUA). This EUA will remain  in effect (meaning this test can be used) for the duration of the COVID-19 declaration under Section 56 4(b)(1) of the Act, 21 U.S.C. section 360bbb-3(b)(1), unless the authorization is terminated or revoked sooner. Performed at [facility], [place] [address], [place], [place] [address]   Respiratory Panel by RT PCR (Flu A&B, Covid) - Nasopharyngeal Swab     Status: None   Collection Time: 03/21/2019  8:16 PM   Specimen: Nasopharyngeal Swab  Result Value Ref Range Status   SARS Coronavirus 2 by RT PCR NEGATIVE NEGATIVE Final    Comment: (NOTE) SARS-CoV-2 target nucleic acids are NOT DETECTED. The SARS-CoV-2 RNA is generally detectable in upper respiratoy specimens during the acute phase of infection. The lowest concentration of SARS-CoV-2 viral copies this assay can detect is 131 copies/mL. A negative result does not preclude SARS-Cov-2 infection and should not be used as the sole basis for treatment or other patient management decisions. A negative result may occur with  improper specimen collection/handling, submission of specimen other than nasopharyngeal swab, presence of viral mutation(s) within the areas targeted by this assay, and inadequate number of viral copies (<131 copies/mL). A negative result must be combined with clinical observations, patient history, and epidemiological information. The expected result is Negative. Fact Sheet for Patients:   PinkCheek.be Fact Sheet for Healthcare Providers:  GravelBags.it This test is not yet ap proved or cleared by the [place] FDA and  has been authorized for detection and/or diagnosis of SARS-CoV-2 by FDA under an Emergency Use Authorization (EUA). This EUA will remain  in effect (meaning this test can be used) for the duration of the COVID-19 declaration under Section 564(b)(1) of the Act, 21 U.S.C. section 360bbb-3(b)(1), unless the authorization is terminated or revoked sooner.    Influenza A by PCR NEGATIVE NEGATIVE Final   Influenza B by PCR NEGATIVE NEGATIVE Final    Comment: (NOTE) The Xpert Xpress SARS-CoV-2/FLU/RSV assay is intended as an aid in  the diagnosis of influenza from Nasopharyngeal swab specimens and  should not be used as a sole basis for treatment. Nasal washings and  aspirates are unacceptable for Xpert Xpress SARS-CoV-2/FLU/RSV  testing. Fact Sheet for Patients: PinkCheek.be Fact Sheet for Healthcare Providers: GravelBags.it This test is not yet approved or cleared by the [place] FDA and  has been authorized for detection and/or diagnosis of SARS-CoV-2 by  FDA under an Emergency Use Authorization (EUA). This EUA will remain  in effect (meaning this test can be used) for the duration of the  Covid-19 declaration under Section 564(b)(1) of the Act, 21  U.S.C. section 360bbb-3(b)(1), unless the authorization is  terminated or revoked. Performed at [facility], [address] [place], [place] [address]   MRSA PCR Screening  Status: Abnormal   Collection Time: 04/02/19  8:54 PM   Specimen: Nasopharyngeal  Result Value Ref Range Status   MRSA by PCR POSITIVE (A) NEGATIVE Final    Comment: CRITICAL RESULT CALLED TO, READ BACK BY AND VERIFIED WITH: RN, Arvella Nigh [id number] @2322  THANEY Performed at [facility], [address], [place], [place] [address]     Lab Basic Metabolic Panel: Recent Labs  Lab 04/01/19 0630 04/02/19 0441 04/03/19 0820 04/04/19 0451 04-10-2019 0403  NA 138 134* 134* 133* 133*  K 5.5* 5.5* 4.5 4.8 4.4  CL 103 98 101 97* 96*  CO2 18* 24 24 23 26   GLUCOSE 182* 128* 107* 99 80  BUN 48* 59* 70* 78* 89*  CREATININE 2.63* 3.46* 3.53* 3.53* 3.49*  CALCIUM 8.6* 8.6* 8.2* 8.4* 8.2*  MG  --   --  1.9 2.1 2.0  PHOS  --   --  4.8* 4.8* 5.0*   Liver Function Tests: Recent Labs  Lab 04/07/2019 0935 04/03/19 0820 04/04/19 0451 2019/04/10 0403  AST 26  --   --   --   ALT 29  --   --   --   ALKPHOS 61  --   --   --   BILITOT 0.6  --   --   --   PROT 6.9  --   --   --   ALBUMIN 3.6 3.0* 3.1* 2.8*   Recent Labs  Lab 03/16/2019 0935  LIPASE 28   No results for input(s): AMMONIA in the last 168 hours. CBC: Recent Labs  Lab 04/13/2019 0935 04/01/19 0630 04/02/19 0441 04/03/19 0525 04/04/19 0451  WBC 10.5 7.1 10.0 8.9 8.3  NEUTROABS 8.0*  --  8.1*  --   --   HGB 11.7* 11.1* 11.5* 10.8* 11.2*  HCT 38.3* 38.6* 37.5* 35.4* 36.0*  MCV 93.9 99.2 93.8 93.7 91.4  PLT 193 159 203 165 186   Cardiac Enzymes: No results for input(s): CKTOTAL, CKMB, CKMBINDEX, TROPONINI in the last 168 hours. Sepsis Labs: Recent Labs  Lab 04/01/19 0630 04/02/19 0441 04/03/19 0525 04/04/19 0451  WBC 7.1 10.0 8.9 8.3    Procedures/Operations  None   Achille Xiang T Juline Sanderford 2019/04/10, 7:04 PM

## 2019-04-14 NOTE — Progress Notes (Addendum)
  Palliative Medicine Inpatient Follow Up Note  This NP visited patient at the bedside as a follow up to yesterday's [place]. Focused on the possibility of hemodialysis in this mornings conversation. Patient asked if it would "help him". Shared with him what HD looks like in terms of possible symptom improvement. Also shared the difficulties associated with dialysis inclusive of possible infection of the line, the need to continue for 3x weekly, the effects it has on your body inclusive of fatigue. We talked about transportation issues associated with HD and the idea that it may ot prevent further hospitalizations.   The patient vocalized that he would not want to keep coming in and out of the hospital. He shared that these last four years have been very difficult for him and led to further decline as opposed to improvement. [name] vocalized that he has a wonderful family who are very supportive of his decisions. He lamented his love for his sons and granddaughter who is a "life saver". We talked about what life would look like with dialysis but also what it may look like through choosing a less aggressive approach.   The second part of our discussion focused on what hospice at home may look like. We discussed focusing on quality and dignity at the end of life. He shared that he is ready to "meet the lord". He feels though that his sons want him to continue on. I asked if it would be okay to reach out to his son, [name] to share his feelings with him which he stated would be okay. Reassured [name] that whatever decision he makes his family will be in support of. Offered emotional support and therapeutic listening.   Called [name] this morning. [name] asked for a more comprehensive medical update as he and his family are not sure which direction to travel in. Asked if we could coordinate a family meeting for better clarification though [name] is not available today therefore will call him to check  in via telephone later in the day. Did request the primary medical team provide the family with an update.   Requested that the chaplain visit this morning as the patient is more clear minded than on the prior day.  Discussed with patient the importance of continued conversation with family and their  medical providers regarding overall plan of care and treatment options,  ensuring decisions are within the context of the patients values and GOCs.  Questions and concerns addressed     Discussed with Dr. Cyndia Skeeters  SUMMARY OF RECOMMENDATIONS   Primary medical team to provide update Continue to discuss patients goals ensuring dignity and quality of life Chaplain Visit Home Palliative Care consultation  Code Status/Advance Care Planning:  DNR/DNI   Symptom Management:   No additional recommendations  Time Spent: 35 Greater than 50% of the time was spent in counseling and coordination of care  [place] Team Team Cell Phone: [phone] Please utilize secure chat with additional questions, if there is no response within 30 minutes please call the above phone number  Vinie Sill, NP Palliative Medicine Team Pager [phone] (Please see [email or web address] for schedule) Team Phone [phone]    Addendum at 10:51  Met with patient and his son [name] at bedside. He endorsed that he is ready "to be with God" and would like to go home on hospice.  Patients son is in agreement with this plan. Hospice referral placed.

## 2019-04-14 NOTE — Progress Notes (Addendum)
RN called by central tele to go check on patient because he was in V-fib, RN ran to room and found patient almost unresponsive, with son at bedside saying he had just started stretching his arms and eyes. RN called for help and called RR. Patient was DNR. Code blue was called, medical team called spoke with family at bedside confirming their decision of DNR and comfort care, per family, they did not want CPR or anything invasive done. Code was stop and patient passed few minutes later.   Spoke with patient son Aaron Edelman concerning wedding ring, per son, daughter in law Winton Pasion will pick up ring from the nursing desk tomorrow 04/06/2019.  7pm 12/23:  Esperanza Sheets from respiratory came to unit and was on the phone with Randa Lynn, per Tonny Branch asked her to pick up the wedding ring because she Joellen Jersey was not working Architectural technologist. RN spoke with Joellen Jersey on the phone and she confirmed above statement. Wedding ring given to [employer]. Witnessed by Sheran Luz and Pharmacist, hospital.

## 2019-04-14 NOTE — TOC Progression Note (Addendum)
Transition of Care [facility]) - Progression Note    Patient Details  Name: [name] MRN: [id number] Date of Birth: 1933-12-30  Transition of Care [facility]) CM/SW [place], [place] Phone Number: [phone] 2019/04/22, 10:14 AM  Clinical Narrative:     Update: Patient will go home with hospice, Alycia Rossetti with [facility] is reaching out to son to inquire as to any equipment needs to be delivered piror to dc. Will keep CSW updated on plan. MD made aware   CSW followed up with [facility]) and spoke with Judson Roch with social work who reports they will review referral and let CSW know today if they are able to accept him. CSW informed her their facility is patient's first choice as he has been there in the past.   Pending call back.   Expected Discharge Plan: Skilled Nursing Facility Barriers to Discharge: Continued Medical Work up, Ship broker  Expected Discharge Plan and Services Expected Discharge Plan: [place] In-house Referral: Clinical Social Work Discharge Planning Services: NA Post Acute Care Choice: [place] Living arrangements for the past 2 months: Single Family Home                 DME Arranged: N/A         HH Arranged: NA [place] Agency: NA         Social Determinants of Health (SDOH) Interventions    Readmission Risk Interventions Readmission Risk Prevention Plan 12/24/2018 12/21/2018 12/20/2018  Transportation Screening Complete - Complete  PCP or Specialist Appt within 3-5 Days - - -  [place] or [place] for [place] - - -  Medication Review Press photographer) Complete - Complete  PCP or Specialist appointment within 3-5 days of discharge Complete - Not Complete  PCP/Specialist Appt Not Complete comments - Patient will be going to SNF and will be seen by facility medical director. -  [place] or Home Care  Consult Complete - Complete  SW Recovery Care/Counseling Consult Complete - Complete  Palliative Care Screening Not Applicable - Not Complete  Skilled Nursing Facility Complete - Complete  Some recent data might be hidden

## 2019-04-14 NOTE — Progress Notes (Signed)
Manufacturing engineer (ACC)  Received referral for hospice services at home.  Pt and chart are under review by [facility] MD, and eligibility is pending.  Pt will discharge today, once necessary DME is set up.    Pt currently has:  O2 at 2 lpm, hospital bed Pt will need: O2 @ 15 lpm, bipap, bedside commode.    Updated TOC manager.   Please send completed DNR home with pt.  Please arrange for any anticipated comfort medications that the pt may need prior to  Hospice services beginning, so there is no lapse in his comfort.  Venia Carbon RN, BSN, [facility] Liaison (in [place]) [phone]

## 2019-04-14 NOTE — Progress Notes (Signed)
Responded to Spiritual Consult.  Met with Mr. Azam nurse who explained they are working on getting released tomorrow to his home with assistance of Hospice.  Nurse and I discussed topics I might speak with him about around going home with Hospice assistance and EOL issues.  Introduced myself to Mr. Ursua his son.  Mr. Griffith was happy to see me.  He spoke of his organs failing him and how he wanted to go home to be with the [place].  He also spoke of being able to go to his earthly home as soon as tomorrow. Mr. Ghuman spoke of his idea of heaven and how he was not afraid to go.  He spoke of being blessed with a wonderful wife who was smart and had always supported him.  He spoke of being able to work for the [employer] over 40 years but how he hasn't been able to do that recently as his body has failed him.  I assured him he is still doing the Lord's work because his testimony had spoken to me and encouraged me today.  The three of [place] had prayer together.    De Burrs Chaplain Resident

## 2019-04-14 NOTE — Code Documentation (Signed)
Paged to patient's room regarding code blue. Personally evaluated the patient and spoke to patient's family who confirmed that the patient wished for DNR/DNI. He was recently changed to comfort care with plans to be discharged with hospice. Patient is currently in [place] . Continue comfort care as planned.   Marianna Payment, D.O. Date 04-06-2019 Time 4:32 PM [facility] Internal Medicine, PGY-1 Pager: [phone]

## 2019-04-14 NOTE — Progress Notes (Signed)
[name]  Assessment/ Plan: Pt is a 86 y.o. yo male  with history of HTN, DM, HLD, obesity, sleep apnea, CAD status post CABG, COPD, CKD, chronic respiratory failure on 2 L,systolic CHF with EF of 30 to 35% admitted with acute CHF exacerbation -  consultation  for AKI on CKD and fluid overload.  #AKI on CKD, nonoliguric likely cardiorenal syndrome with reduced effective renal perfusion.  UA was cloudy some protein and RBC unknown if it is traumatic.  Recently the urine was normal.  Doubt it is GN.  He has significant volume overload - UOP much better with inc dose of lasix and diuril.  Noted he is DNR and palliative care was consulted to discuss goals of care.  No urgent indication for dialysis- I honestly do not think that dialysis is the answer to this issue-  I dont think he would tolerate well  and we would end up decreasing his quality of life to an unacceptable level.  My plan is to continue medical management for now.  I have let my thoughts be known to him and he appreciates this, he seems to be leaning toward not doing dialysis    #Hyperkalemia: In the setting of AKI.  Improved   #Acute CHF exacerbation/and NSTEMI: IV heparin and cardiology is following.  LVEF 30 to 35%.  #Acute on chronic hypoxic respiratory failure with pulmonary edema: Diuresis as above.  Anemia-  Not a significant issue, does not require treatment at present   Subjective:  Good UOP with lasix 120 q 8 and diuril 500 q 12   -  2.3 liters negative - BUN up - crt stable.  He is alert and not uremic.  He is confused regarding it all " I dont know what to do "    Objective Vital signs in last 24 hours: Vitals:   04-22-19 0500 04/22/19 0605 04/22/19 0717 22-Apr-2019 0759  BP:  124/75  128/60  Pulse:  75 78 80  Resp:   16 16  Temp:    99.2 F (37.3 C)  TempSrc:    Axillary  SpO2:   93% 96%  Weight: 120.2 kg     Height:       Weight change: -3.7 kg  Intake/Output  Summary (Last 24 hours) at 2019/04/22 0811 Last data filed at 2019/04/22 0437 Gross per 24 hour  Intake 592 ml  Output 2425 ml  Net -1833 ml       Labs: Basic Metabolic Panel: Recent Labs  Lab 04/03/19 0820 04/04/19 0451 April 22, 2019 0403  NA 134* 133* 133*  K 4.5 4.8 4.4  CL 101 97* 96*  CO2 24 23 26   GLUCOSE 107* 99 80  BUN 70* 78* 89*  CREATININE 3.53* 3.53* 3.49*  CALCIUM 8.2* 8.4* 8.2*  PHOS 4.8* 4.8* 5.0*   Liver Function Tests: Recent Labs  Lab 04/04/2019 0935 04/03/19 0820 04/04/19 0451 04-22-2019 0403  AST 26  --   --   --   ALT 29  --   --   --   ALKPHOS 61  --   --   --   BILITOT 0.6  --   --   --   PROT 6.9  --   --   --   ALBUMIN 3.6 3.0* 3.1* 2.8*   Recent Labs  Lab 03/18/2019 0935  LIPASE 28   No results for input(s): AMMONIA in the last 168 hours. CBC: Recent Labs  Lab 04/09/2019 0935 04/01/19  0630 04/02/19 0441 04/03/19 0525 04/04/19 0451  WBC 10.5 7.1 10.0 8.9 8.3  NEUTROABS 8.0*  --  8.1*  --   --   HGB 11.7* 11.1* 11.5* 10.8* 11.2*  HCT 38.3* 38.6* 37.5* 35.4* 36.0*  MCV 93.9 99.2 93.8 93.7 91.4  PLT 193 159 203 165 186   Cardiac Enzymes: No results for input(s): CKTOTAL, CKMB, CKMBINDEX, TROPONINI in the last 168 hours. CBG: Recent Labs  Lab 04/04/19 0652 04/04/19 1106 04/04/19 1638 04/04/19 2108 April 12, 2019 0634  GLUCAP 96 124* 128* 119* 92    Iron Studies: No results for input(s): IRON, TIBC, TRANSFERRIN, FERRITIN in the last 72 hours. Studies/Results: DG Chest Port 1 View  Result Date: 04/04/2019 CLINICAL DATA:  86 year old male with shortness of breath. EXAM: PORTABLE CHEST 1 VIEW COMPARISON:  Chest radiograph dated 04/12/2019. FINDINGS: There is complete opacification of the left hemithorax which may represent interval development of a large left pleural effusion and associated atelectasis. The right lung base density also likely combination of pleural effusion and associated atelectasis. No pneumothorax. The cardiac borders  are silhouetted. Median sternotomy wires noted. No acute osseous pathology. IMPRESSION: Complete opacification of the left hemithorax likely representing a large left pleural effusion and associated atelectasis. Electronically Signed   By: Anner Crete M.D.   On: 04/04/2019 22:09    Medications: Infusions: . sodium chloride 250 mL (04-12-19 0611)  . chlorothiazide (DIURIL) IV 500 mg (04/04/19 2148)  . furosemide 120 mg (Apr 12, 2019 0612)    Scheduled Medications: . AeroChamber Plus Flo-Vu Large  1 each Other Once  . albuterol  6 puff Inhalation Once  . allopurinol  50 mg Oral QODAY  . aspirin EC  81 mg Oral q morning - 10a  . Chlorhexidine Gluconate Cloth  6 each Topical Daily  . Chlorhexidine Gluconate Cloth  6 each Topical Q0600  . clopidogrel  75 mg Oral Daily  . escitalopram  5 mg Oral Daily  . heparin injection (subcutaneous)  5,000 Units Subcutaneous Q8H  . insulin aspart  0-5 Units Subcutaneous QHS  . insulin aspart  0-9 Units Subcutaneous TID WC  . insulin aspart  6 Units Subcutaneous TID WC  . isosorbide mononitrate  120 mg Oral Daily  . magnesium oxide  400 mg Oral Daily  . metoprolol tartrate  50 mg Oral BID  . mupirocin ointment  1 application Nasal BID  . pantoprazole  40 mg Oral Daily  . sodium chloride flush  3 mL Intravenous Q12H  . tamsulosin  0.4 mg Oral QPM    have reviewed scheduled and prn medications.  Physical Exam: General: Ill-looking male, not in apparent distress- on bipap "it helps my breathing"   Heart:RRR, s1s2 nl, no rub Lungs: Bibasal crackle, no increased work of breathing Abdomen:soft, Non-tender, -distended Extremities: Lower extremity edema++ Neurology: Alert, awake and following commands.  No asterixis.  Wildon Cuevas A Alisha Bacus 04/12/19,8:11 AM  LOS: 5 days

## 2019-04-14 NOTE — Progress Notes (Signed)
Paged [name] on call about holding lantus due to poor oral intake and lethargy.  NP confirmed to hold.  Also asked about IV lasix given BP of  104/51 after diuril and NP conformed to hold iv lasix.  Made NP aware of inability to give metoprolol PO due to patient lethargy.

## 2019-04-14 NOTE — Progress Notes (Signed)
Responded to Code blue and attended to [name] son who was in the room.  Son confirmed with physician to not perform any life-saving measures.  Son and his daughter-in-law were at the bedside attending [name] as he died. We discussed if any additional family members would be allowed in.  As the discussion ensued, they decided no other family members were really in a place to come; there would be a funeral and they would all see him there.  We reminisced about the conversation with Mr. Mulla earlier in the day and rejoiced over his almost-perfect last day.  Offered a blessing and departed.  De Burrs Chaplain Resident

## 2019-04-14 DEATH — deceased

## 2019-04-25 ENCOUNTER — Ambulatory Visit: Payer: PPO | Admitting: Cardiology

## 2019-05-05 ENCOUNTER — Ambulatory Visit: Payer: PPO | Admitting: Cardiology

## 2021-01-29 IMAGING — CT CT CERVICAL SPINE W/O CM
3 of 10 series · 9 of 33 positions shown, 10 images · non-contrast
Comparison: Head CT dated 02/08/2017

CLINICAL DATA: 85-year-old male with fall.

EXAM:
CT HEAD WITHOUT CONTRAST
CT CERVICAL SPINE WITHOUT CONTRAST
TECHNIQUE: Multidetector CT imaging of the head and cervical spine was
performed following the standard protocol without intravenous
contrast. Multiplanar CT image reconstructions of the cervical spine
were also generated.

[Series 12: c spine soft · axial · 0.36mm/px · z∈[+1534,+1642]mm · 4 of 91 slices shown]
[im 19/91  soft-tissue]
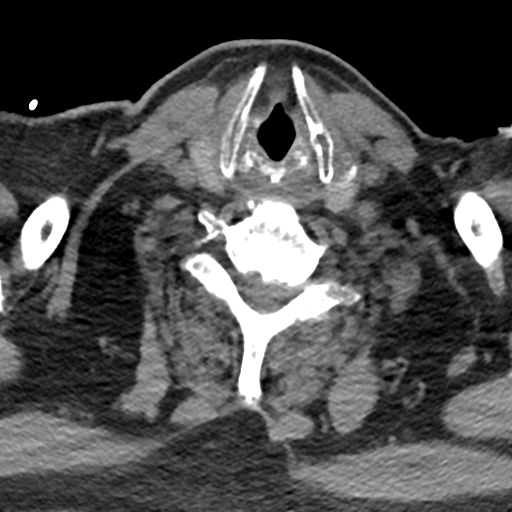
[im 37/91  soft-tissue]
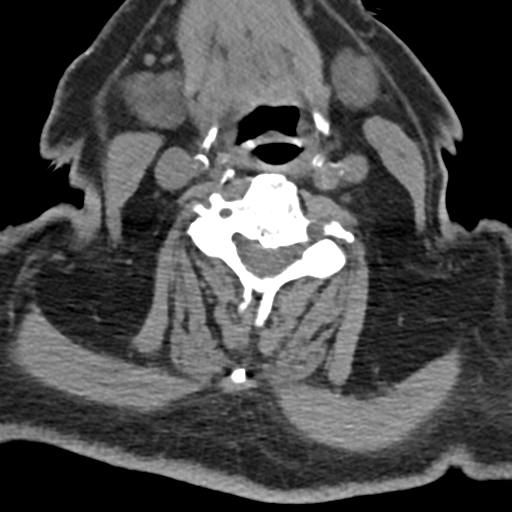
[im 55/91  soft-tissue]
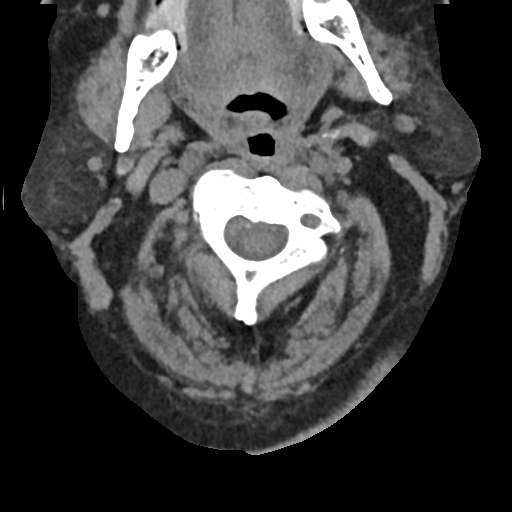
[im 73/91  soft-tissue]
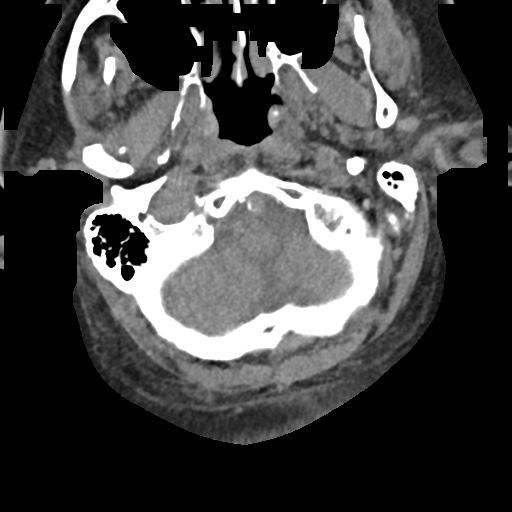

[Series 13: sagittal bone · sagittal · 0.26mm/px · 2 of 61 slices shown]
[im 21/61  bone]
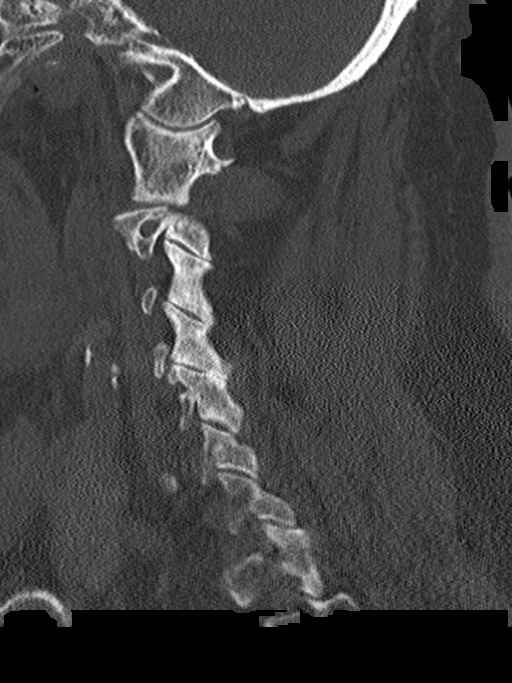
[im 41/61  bone]
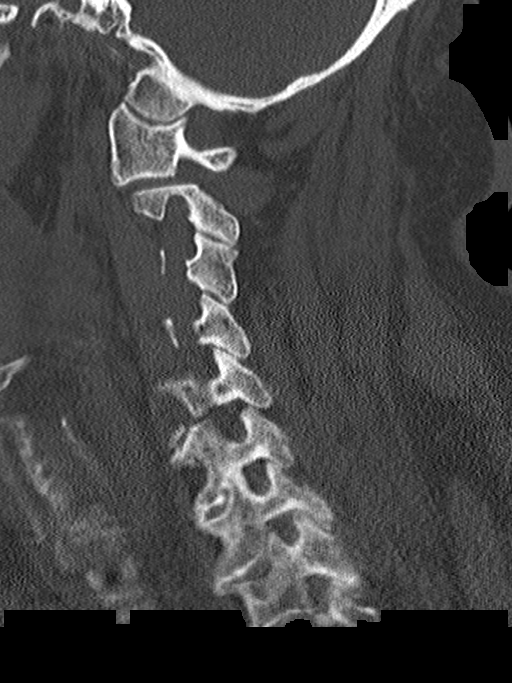

[Series 15: orthogonal axials · axial · 0.21mm/px · z∈[+1522,+1605]mm · 3 of 80 slices shown, 4 images]
[im 20/80  soft-tissue]
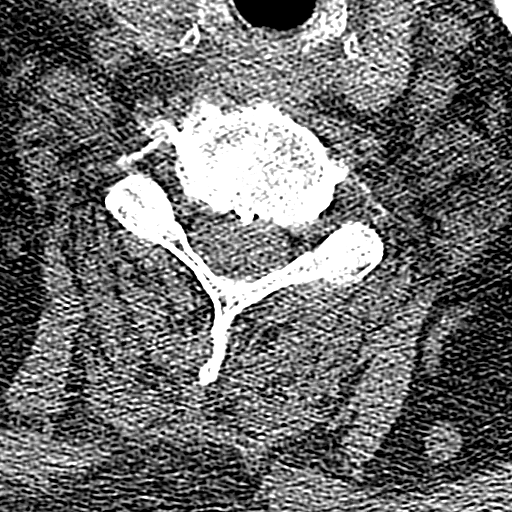
[im 20/80  bone]
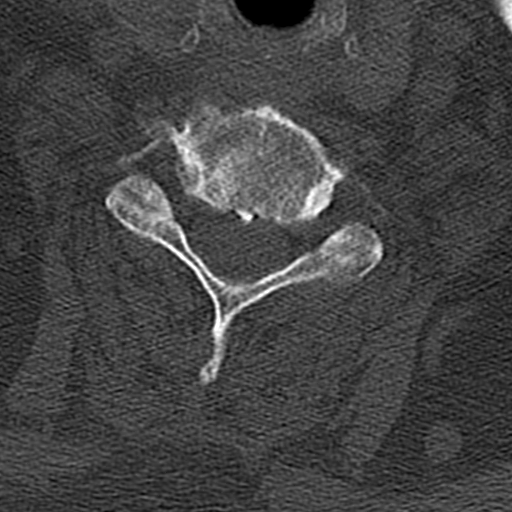
[im 40/80  bone]
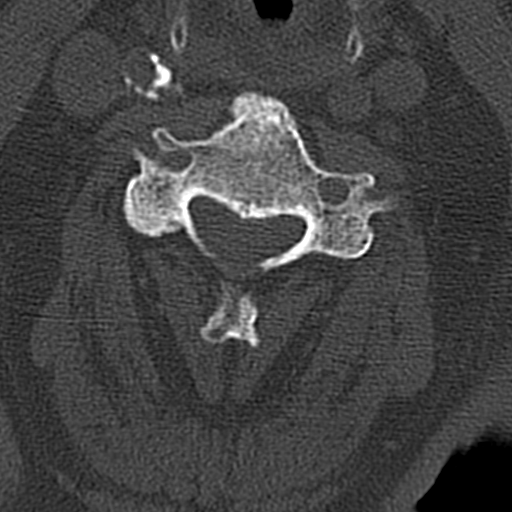
[im 60/80  bone]
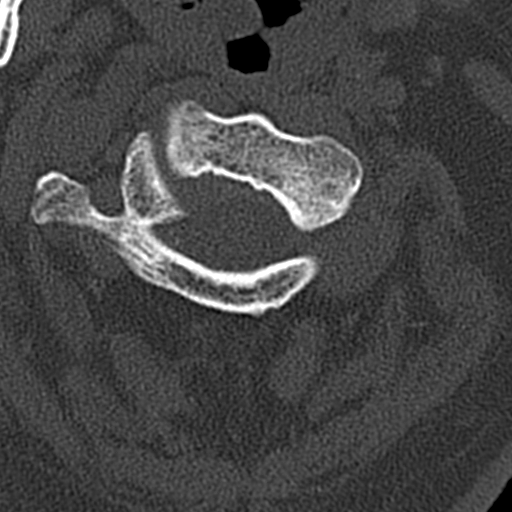

[9 of 33 positions shown; findings below may reference images not displayed]

FINDINGS: Evaluation of this exam is limited due to motion artifact.

CT HEAD FINDINGS

Brain: There is mild age-related atrophy and chronic microvascular
ischemic changes. There is no acute intracranial hemorrhage. No mass
effect or midline shift. No extra-axial fluid collection.

Vascular: No hyperdense vessel or unexpected calcification.

Skull: Normal. Negative for fracture or focal lesion.

Sinuses/Orbits: No acute finding.

Other: None

CT CERVICAL SPINE FINDINGS

Alignment: No acute subluxation.

Skull base and vertebrae: No acute fracture.

Soft tissues and spinal canal: No prevertebral fluid or swelling. No
visible canal hematoma.

Disc levels:  Multilevel degenerative changes.

Upper chest: Negative.

Other: Bilateral carotid bulb calcified plaques.
IMPRESSION: 1. No acute intracranial hemorrhage.
2. No acute/traumatic cervical spine pathology.
